# Patient Record
Sex: Female | Born: 1935 | Race: White | Hispanic: No | Smoking: Never smoker
Health system: Southern US, Community
[De-identification: ages and names within clinical notes are randomized; demographics above are authoritative.]

## PROBLEM LIST (undated history)

## (undated) DIAGNOSIS — K8020 Calculus of gallbladder without cholecystitis without obstruction: Secondary | ICD-10-CM

## (undated) DIAGNOSIS — E785 Hyperlipidemia, unspecified: Secondary | ICD-10-CM

## (undated) DIAGNOSIS — F419 Anxiety disorder, unspecified: Secondary | ICD-10-CM

## (undated) DIAGNOSIS — E119 Type 2 diabetes mellitus without complications: Secondary | ICD-10-CM

## (undated) DIAGNOSIS — K529 Noninfective gastroenteritis and colitis, unspecified: Secondary | ICD-10-CM

## (undated) DIAGNOSIS — C801 Malignant (primary) neoplasm, unspecified: Secondary | ICD-10-CM

## (undated) DIAGNOSIS — M1990 Unspecified osteoarthritis, unspecified site: Secondary | ICD-10-CM

## (undated) DIAGNOSIS — I10 Essential (primary) hypertension: Secondary | ICD-10-CM

## (undated) DIAGNOSIS — K589 Irritable bowel syndrome without diarrhea: Secondary | ICD-10-CM

## (undated) HISTORY — PX: ROTATOR CUFF REPAIR: SHX139

## (undated) HISTORY — PX: BACK SURGERY: SHX140

## (undated) HISTORY — PX: EYE SURGERY: SHX253

## (undated) HISTORY — DX: Calculus of gallbladder without cholecystitis without obstruction: K80.20

## (undated) HISTORY — DX: Essential (primary) hypertension: I10

## (undated) HISTORY — PX: WRIST SURGERY: SHX841

## (undated) HISTORY — PX: OTHER SURGICAL HISTORY: SHX169

## (undated) HISTORY — DX: Unspecified osteoarthritis, unspecified site: M19.90

## (undated) HISTORY — DX: Hyperlipidemia, unspecified: E78.5

## (undated) HISTORY — DX: Irritable bowel syndrome, unspecified: K58.9

## (undated) HISTORY — PX: APPENDECTOMY: SHX54

---

## 1997-07-19 HISTORY — PX: CHOLECYSTECTOMY: SHX55

## 1998-02-24 ENCOUNTER — Other Ambulatory Visit: Admission: RE | Admit: 1998-02-24 | Discharge: 1998-02-24 | Payer: Self-pay | Admitting: Cardiology

## 1998-04-24 ENCOUNTER — Encounter: Payer: Self-pay | Admitting: Gastroenterology

## 1998-04-24 ENCOUNTER — Ambulatory Visit (HOSPITAL_COMMUNITY): Admission: RE | Admit: 1998-04-24 | Discharge: 1998-04-24 | Payer: Self-pay | Admitting: Gastroenterology

## 1998-05-28 ENCOUNTER — Ambulatory Visit (HOSPITAL_COMMUNITY): Admission: RE | Admit: 1998-05-28 | Discharge: 1998-05-28 | Payer: Self-pay | Admitting: Gastroenterology

## 1998-05-28 ENCOUNTER — Encounter: Payer: Self-pay | Admitting: Gastroenterology

## 1998-06-19 ENCOUNTER — Observation Stay (HOSPITAL_COMMUNITY): Admission: RE | Admit: 1998-06-19 | Discharge: 1998-06-20 | Payer: Self-pay | Admitting: General Surgery

## 1998-07-09 ENCOUNTER — Ambulatory Visit (HOSPITAL_COMMUNITY): Admission: RE | Admit: 1998-07-09 | Discharge: 1998-07-09 | Payer: Self-pay | Admitting: General Surgery

## 1998-07-10 ENCOUNTER — Encounter: Payer: Self-pay | Admitting: General Surgery

## 1998-07-10 ENCOUNTER — Ambulatory Visit (HOSPITAL_COMMUNITY): Admission: RE | Admit: 1998-07-10 | Discharge: 1998-07-10 | Payer: Self-pay | Admitting: General Surgery

## 2000-04-13 ENCOUNTER — Other Ambulatory Visit: Admission: RE | Admit: 2000-04-13 | Discharge: 2000-04-13 | Payer: Self-pay | Admitting: Internal Medicine

## 2001-11-30 ENCOUNTER — Encounter: Payer: Self-pay | Admitting: Gastroenterology

## 2001-11-30 ENCOUNTER — Ambulatory Visit (HOSPITAL_COMMUNITY): Admission: RE | Admit: 2001-11-30 | Discharge: 2001-11-30 | Payer: Self-pay | Admitting: Gastroenterology

## 2003-05-04 ENCOUNTER — Emergency Department (HOSPITAL_COMMUNITY): Admission: EM | Admit: 2003-05-04 | Discharge: 2003-05-04 | Payer: Self-pay | Admitting: Emergency Medicine

## 2003-05-04 ENCOUNTER — Encounter: Payer: Self-pay | Admitting: Emergency Medicine

## 2004-05-20 ENCOUNTER — Ambulatory Visit: Payer: Self-pay

## 2006-05-12 ENCOUNTER — Encounter: Admission: RE | Admit: 2006-05-12 | Discharge: 2006-05-12 | Payer: Self-pay | Admitting: Internal Medicine

## 2008-09-03 ENCOUNTER — Ambulatory Visit (HOSPITAL_COMMUNITY): Admission: RE | Admit: 2008-09-03 | Discharge: 2008-09-03 | Payer: Self-pay | Admitting: Internal Medicine

## 2009-09-10 ENCOUNTER — Encounter: Admission: RE | Admit: 2009-09-10 | Discharge: 2009-09-10 | Payer: Self-pay | Admitting: Internal Medicine

## 2009-10-28 ENCOUNTER — Ambulatory Visit (HOSPITAL_COMMUNITY): Admission: RE | Admit: 2009-10-28 | Discharge: 2009-10-28 | Payer: Self-pay | Admitting: Internal Medicine

## 2010-06-01 ENCOUNTER — Ambulatory Visit (HOSPITAL_BASED_OUTPATIENT_CLINIC_OR_DEPARTMENT_OTHER): Admission: RE | Admit: 2010-06-01 | Discharge: 2010-06-01 | Payer: Self-pay | Admitting: Specialist

## 2010-09-29 LAB — CBC
MCH: 32.8 pg (ref 26.0–34.0)
MCV: 95.4 fL (ref 78.0–100.0)
Platelets: 285 10*3/uL (ref 150–400)
RDW: 12.5 % (ref 11.5–15.5)
WBC: 7.1 10*3/uL (ref 4.0–10.5)

## 2010-09-29 LAB — BASIC METABOLIC PANEL
BUN: 12 mg/dL (ref 6–23)
Calcium: 9.1 mg/dL (ref 8.4–10.5)
Chloride: 106 mEq/L (ref 96–112)
Creatinine, Ser: 0.7 mg/dL (ref 0.4–1.2)

## 2010-12-04 NOTE — Op Note (Signed)
NAME:  Marissa Ryan, Marissa Ryan                            ACCOUNT NO.:  [id number]   MEDICAL RECORD NO.:  [id number]                   PATIENT TYPE:  EMS   LOCATION:  ED                                   FACILITY:  [facility]   PHYSICIAN:  Dionne Ano. Everlene Other, M.D.         DATE OF BIRTH:  Oct 27, 1924   DATE OF PROCEDURE:  DATE OF DISCHARGE:                                 OPERATIVE REPORT   HISTORY:  I had the pleasure to see Marissa Ryan today in the [facility]  Emergency Room. This patient is a very pleasant 77 year old female who fell  today sustaining a left wrist fracture. She denies other injury. She is here  in the emergency room with her husband of many years.   The patient notes that she fell from a standing position. This was a simple  trip and she did not have any prodromal nausea or fainting, etc. The patient  and I have discussed this at length. She currently is in fairly severe pain  in the wrist she notes.   ALLERGIES:  PENICILLIN.   MEDICATIONS:  Hypercholesterolemia medication and antibiotics secondary to a  recent UTI.   PAST MEDICAL HISTORY:  Hypercholesterolemia.   PAST SURGICAL HISTORY:  Cholecystectomy, appendectomy, back surgery and she  has a history of a foot fracture.   SOCIAL HISTORY:  She does not smoke and she only rarely consumes alcoholic  beverages. She lives with her husband of many years.   PHYSICAL EXAMINATION:  The patient has stable vital signs. She is alert and  oriented. Lower extremity examination is benign. Right upper extremity  examination reveals a right limb which is neurovascularly intact. Neck and  back are nontender. Abdomen and chest are normal. The left wrist is swollen  at the distal radius, elbow and shoulder are nontender. She has an abrasion  over the right elbow with no bony deformities.   She has normal radial, medial, and ulnar nerve sensation and motor function.  I reviewed her x-rays which showed a fracture of the distal radius.  This is  slightly impacted and without gross collapse at present time. There is some  loss in the radial height but certainly no advanced changes at present time.   I reviewed this at length and her findings.   IMPRESSION:  Distal radius fracture left upper extremity.   PLAN:  I placed her in finger trap traction, performed gentle closed  reduction and placed a sugar tong cast on her. She tolerated this well.  Following gentle reduction, the patient then had post reduction x-rays taken  which were  reviewed. I plan to see her back in the office early next week. I have given  her Vicodin and Robaxin for pain, asked her to ice, elevate, move her  fingers frequently and notify me should any problems, questions or concerns  arise. It has been an absolute pleasure to see her today  and all questions  have been encouraged and answered.                                               Dionne Ano. Everlene Other, M.D.    [name]  D:  05/04/2003  T:  05/05/2003  Job:  [id number]

## 2010-12-18 ENCOUNTER — Ambulatory Visit (HOSPITAL_COMMUNITY): Payer: Medicare Other | Attending: Internal Medicine

## 2010-12-18 DIAGNOSIS — M810 Age-related osteoporosis without current pathological fracture: Secondary | ICD-10-CM | POA: Insufficient documentation

## 2011-05-07 ENCOUNTER — Encounter: Payer: Self-pay | Admitting: Gastroenterology

## 2011-05-12 ENCOUNTER — Encounter: Payer: Self-pay | Admitting: Gastroenterology

## 2011-06-02 ENCOUNTER — Ambulatory Visit (INDEPENDENT_AMBULATORY_CARE_PROVIDER_SITE_OTHER): Payer: Medicare Other | Admitting: Gastroenterology

## 2011-06-02 ENCOUNTER — Encounter: Payer: Self-pay | Admitting: Gastroenterology

## 2011-06-02 VITALS — BP 138/82 | HR 88 | Ht 64.0 in | Wt 157.4 lb

## 2011-06-02 DIAGNOSIS — K5900 Constipation, unspecified: Secondary | ICD-10-CM

## 2011-06-02 DIAGNOSIS — Z1211 Encounter for screening for malignant neoplasm of colon: Secondary | ICD-10-CM

## 2011-06-02 NOTE — Progress Notes (Signed)
History of Present Illness: This is an  77 year old female who relates a 2 year history of constipation with frequent bloating. She attributes this to her need for hydrocodone to control chronic back pain. She states with a combination of 2 laxatives increased fluids and increased fiber has adequate bowel movements. Her bloating is generally well controlled with as needed use of Gas-X and being. She underwent colonoscopy in 2002 showing only sigmoid colon diverticulosis. She was treated for irritable bowel syndrome at that time. Denies weight loss, abdominal pain, diarrhea, change in stool caliber, melena, hematochezia, nausea, vomiting, dysphagia, reflux symptoms, chest pain.  Past Medical History  Diagnosis Date  . Diverticulosis   . Osteoporosis   . Hypertension   . Arthritis   . Gallstones   . Hyperlipidemia   . IBS (irritable bowel syndrome)    Past Surgical History  Procedure Date  . Cholecystectomy 1999  . Back surgery     x2  . Appendectomy   . Rotator cuff repair     reports that she has never smoked. She has never used smokeless tobacco. She reports that she does not drink alcohol or use illicit drugs. family history includes Hypertension in her mother; Ovarian cancer in her sister; and Stroke in her mother. Allergies  Allergen Reactions  . Morphine And Related   . Nsaids   . Penicillins    Outpatient Encounter Prescriptions as of 06/02/2011  Medication Sig Dispense Refill  . aspirin 81 MG tablet Take 81 mg by mouth daily.        . calcium carbonate (OS-CAL) 600 MG TABS Take 600 mg by mouth 2 (two) times daily with a meal.        . calcium carbonate (TUMS - DOSED IN MG ELEMENTAL CALCIUM) 500 MG chewable tablet Chew 1 tablet by mouth as needed.        . cholecalciferol (VITAMIN D) 1000 UNITS tablet Take 1,000 Units by mouth daily.        Marland Kitchen ezetimibe-simvastatin (VYTORIN) 10-40 MG per tablet Take by mouth at bedtime. 1/2 tablet 4 times a week       . fish oil-omega-3 fatty  acids 1000 MG capsule Take 2 g by mouth daily.        Marland Kitchen gabapentin (NEURONTIN) 300 MG capsule Take 300 mg by mouth 2 (two) times daily.        Marland Kitchen losartan (COZAAR) 50 MG tablet Take 50 mg by mouth daily. 1/2 tablet daily       . Multiple Vitamins-Minerals (CENTRUM) tablet Take 1 tablet by mouth daily.        Marland Kitchen oxyCODONE-acetaminophen (PERCOCET) 10-325 MG per tablet Take 1 tablet by mouth every 4 (four) hours as needed.        Bertram Gala Glycol-Propyl Glycol (SYSTANE) 0.4-0.3 % SOLN Apply 1 drop to eye as needed.        . timolol (BETIMOL) 0.5 % ophthalmic solution Place 1 drop into the left eye 2 (two) times daily.        . Travoprost, BAK Free, (TRAVATAN) 0.004 % SOLN ophthalmic solution Place 1 drop into the left eye at bedtime.        Marland Kitchen zolpidem (AMBIEN) 10 MG tablet Take 10 mg by mouth at bedtime as needed.          Review of Systems: Pertinent positive and negative review of systems were noted in the above HPI section. All other review of systems were otherwise negative.   Physical Exam: General:  Well developed , well nourished, no acute distress Head: Normocephalic and atraumatic Eyes:  sclerae anicteric, EOMI Ears: Normal auditory acuity Mouth: No deformity or lesions Neck: Supple, no masses or thyromegaly Lungs: Clear throughout to auscultation Heart: Regular rate and rhythm; no murmurs, rubs or bruits Abdomen: Soft, non tender and non distended. No masses, hepatosplenomegaly or hernias noted. Normal Bowel sounds Musculoskeletal: Symmetrical with no gross deformities  Skin: No lesions on visible extremities Pulses:  Normal pulses noted Extremities: No clubbing, cyanosis, edema or deformities noted Neurological: Alert oriented x 4, grossly nonfocal Cervical Nodes:  No significant cervical adenopathy Inguinal Nodes: No significant inguinal adenopathy Psychological:  Alert and cooperative. Normal mood and affect  Assessment and Recommendations:  1. Constipation with frequent  bloating likely related to pain medications. Continue current laxatives along with a high fiber diet and Gas-X and Beano as needed. It has been 10 years since her last colonoscopy but given her age colonoscopy would be optional for her and she prefers not to proceed with a screening colonoscopy.

## 2011-06-02 NOTE — Patient Instructions (Signed)
Constipation in Adults Constipation is having fewer than 2 bowel movements per week. Usually, the stools are hard. As we grow older, constipation is more common. If you try to fix constipation with laxatives, the problem may get worse. This is because laxatives taken over a long period of time make the colon muscles weaker. A low-fiber diet, not taking in enough fluids, and taking some medicines may make these problems worse. MEDICATIONS THAT MAY CAUSE CONSTIPATION  Water pills (diuretics).   Calcium channel blockers (used to control blood pressure and for the heart).   Certain pain medicines (narcotics).   Anticholinergics.   Anti-inflammatory agents.   Antacids that contain aluminum.  DISEASES THAT CONTRIBUTE TO CONSTIPATION  Diabetes.   Parkinson's disease.   Dementia.   Stroke.   Depression.   Illnesses that cause problems with salt and water metabolism.  HOME CARE INSTRUCTIONS   Constipation is usually best cared for without medicines. Increasing dietary fiber and eating more fruits and vegetables is the best way to manage constipation.   Slowly increase fiber intake to 25 to 38 grams per day. Whole grains, fruits, vegetables, and legumes are good sources of fiber. A dietitian can further help you incorporate high-fiber foods into your diet.   Drink enough water and fluids to keep your urine clear or pale yellow.   A fiber supplement may be added to your diet if you cannot get enough fiber from foods.   Increasing your activities also helps improve regularity.   Suppositories, as suggested by your caregiver, will also help. If you are using antacids, such as aluminum or calcium containing products, it will be helpful to switch to products containing magnesium if your caregiver says it is okay.   If you have been given a liquid injection (enema) today, this is only a temporary measure. It should not be relied on for treatment of longstanding (chronic) constipation.    Stronger measures, such as magnesium sulfate, should be avoided if possible. This may cause uncontrollable diarrhea. Using magnesium sulfate may not allow you time to make it to the bathroom.  SEEK IMMEDIATE MEDICAL CARE IF:   There is bright red blood in the stool.   The constipation stays for more than 4 days.   There is belly (abdominal) or rectal pain.   You do not seem to be getting better.   You have any questions or concerns.  MAKE SURE YOU:   Understand these instructions.   Will watch your condition.   Will get help right away if you are not doing well or get worse.  Document Released: 04/02/2004 Document Revised: 03/17/2011 Document Reviewed: 02/21/2008 [facility] Patient Information 2012 [place], [place].  cc: Rodrigo Ran, MD

## 2011-06-03 ENCOUNTER — Encounter: Payer: Self-pay | Admitting: Gastroenterology

## 2011-07-27 DIAGNOSIS — Z1231 Encounter for screening mammogram for malignant neoplasm of breast: Secondary | ICD-10-CM | POA: Diagnosis not present

## 2011-07-29 DIAGNOSIS — M961 Postlaminectomy syndrome, not elsewhere classified: Secondary | ICD-10-CM | POA: Diagnosis not present

## 2011-08-10 DIAGNOSIS — H409 Unspecified glaucoma: Secondary | ICD-10-CM | POA: Diagnosis not present

## 2011-08-10 DIAGNOSIS — H4011X0 Primary open-angle glaucoma, stage unspecified: Secondary | ICD-10-CM | POA: Diagnosis not present

## 2011-08-11 DIAGNOSIS — M4120 Other idiopathic scoliosis, site unspecified: Secondary | ICD-10-CM | POA: Diagnosis not present

## 2011-08-11 DIAGNOSIS — M5137 Other intervertebral disc degeneration, lumbosacral region: Secondary | ICD-10-CM | POA: Diagnosis not present

## 2011-08-11 DIAGNOSIS — M545 Low back pain: Secondary | ICD-10-CM | POA: Diagnosis not present

## 2011-08-11 DIAGNOSIS — M961 Postlaminectomy syndrome, not elsewhere classified: Secondary | ICD-10-CM | POA: Diagnosis not present

## 2011-09-07 DIAGNOSIS — M961 Postlaminectomy syndrome, not elsewhere classified: Secondary | ICD-10-CM | POA: Diagnosis not present

## 2011-10-13 DIAGNOSIS — H409 Unspecified glaucoma: Secondary | ICD-10-CM | POA: Diagnosis not present

## 2011-10-13 DIAGNOSIS — H4011X0 Primary open-angle glaucoma, stage unspecified: Secondary | ICD-10-CM | POA: Diagnosis not present

## 2011-10-19 DIAGNOSIS — R809 Proteinuria, unspecified: Secondary | ICD-10-CM | POA: Diagnosis not present

## 2011-10-19 DIAGNOSIS — E559 Vitamin D deficiency, unspecified: Secondary | ICD-10-CM | POA: Diagnosis not present

## 2011-10-19 DIAGNOSIS — E785 Hyperlipidemia, unspecified: Secondary | ICD-10-CM | POA: Diagnosis not present

## 2011-10-19 DIAGNOSIS — R82998 Other abnormal findings in urine: Secondary | ICD-10-CM | POA: Diagnosis not present

## 2011-10-19 DIAGNOSIS — E119 Type 2 diabetes mellitus without complications: Secondary | ICD-10-CM | POA: Diagnosis not present

## 2011-10-19 DIAGNOSIS — M810 Age-related osteoporosis without current pathological fracture: Secondary | ICD-10-CM | POA: Diagnosis not present

## 2011-10-19 DIAGNOSIS — I10 Essential (primary) hypertension: Secondary | ICD-10-CM | POA: Diagnosis not present

## 2011-10-25 DIAGNOSIS — H5210 Myopia, unspecified eye: Secondary | ICD-10-CM | POA: Diagnosis not present

## 2011-10-25 DIAGNOSIS — H409 Unspecified glaucoma: Secondary | ICD-10-CM | POA: Diagnosis not present

## 2011-10-25 DIAGNOSIS — H52229 Regular astigmatism, unspecified eye: Secondary | ICD-10-CM | POA: Diagnosis not present

## 2011-10-25 DIAGNOSIS — H4011X0 Primary open-angle glaucoma, stage unspecified: Secondary | ICD-10-CM | POA: Diagnosis not present

## 2011-10-26 DIAGNOSIS — Z0000 Encounter for general adult medical examination without abnormal findings: Secondary | ICD-10-CM | POA: Diagnosis not present

## 2011-10-26 DIAGNOSIS — E119 Type 2 diabetes mellitus without complications: Secondary | ICD-10-CM | POA: Diagnosis not present

## 2011-10-26 DIAGNOSIS — I10 Essential (primary) hypertension: Secondary | ICD-10-CM | POA: Diagnosis not present

## 2011-10-26 DIAGNOSIS — Z23 Encounter for immunization: Secondary | ICD-10-CM | POA: Diagnosis not present

## 2011-10-26 DIAGNOSIS — Z124 Encounter for screening for malignant neoplasm of cervix: Secondary | ICD-10-CM | POA: Diagnosis not present

## 2011-10-26 DIAGNOSIS — R809 Proteinuria, unspecified: Secondary | ICD-10-CM | POA: Diagnosis not present

## 2011-10-27 DIAGNOSIS — Z1212 Encounter for screening for malignant neoplasm of rectum: Secondary | ICD-10-CM | POA: Diagnosis not present

## 2011-10-29 DIAGNOSIS — M961 Postlaminectomy syndrome, not elsewhere classified: Secondary | ICD-10-CM | POA: Diagnosis not present

## 2011-11-11 ENCOUNTER — Ambulatory Visit (INDEPENDENT_AMBULATORY_CARE_PROVIDER_SITE_OTHER): Payer: Medicare Other | Admitting: Ophthalmology

## 2011-11-11 DIAGNOSIS — E11319 Type 2 diabetes mellitus with unspecified diabetic retinopathy without macular edema: Secondary | ICD-10-CM | POA: Diagnosis not present

## 2011-11-11 DIAGNOSIS — I10 Essential (primary) hypertension: Secondary | ICD-10-CM

## 2011-11-11 DIAGNOSIS — H3581 Retinal edema: Secondary | ICD-10-CM

## 2011-11-11 DIAGNOSIS — E1165 Type 2 diabetes mellitus with hyperglycemia: Secondary | ICD-10-CM

## 2011-11-11 DIAGNOSIS — H35039 Hypertensive retinopathy, unspecified eye: Secondary | ICD-10-CM

## 2011-11-11 DIAGNOSIS — H43819 Vitreous degeneration, unspecified eye: Secondary | ICD-10-CM

## 2011-11-11 DIAGNOSIS — E1139 Type 2 diabetes mellitus with other diabetic ophthalmic complication: Secondary | ICD-10-CM | POA: Diagnosis not present

## 2011-11-29 DIAGNOSIS — M65839 Other synovitis and tenosynovitis, unspecified forearm: Secondary | ICD-10-CM | POA: Diagnosis not present

## 2011-11-29 DIAGNOSIS — M65849 Other synovitis and tenosynovitis, unspecified hand: Secondary | ICD-10-CM | POA: Diagnosis not present

## 2011-12-09 DIAGNOSIS — M65839 Other synovitis and tenosynovitis, unspecified forearm: Secondary | ICD-10-CM | POA: Diagnosis not present

## 2011-12-09 DIAGNOSIS — M65849 Other synovitis and tenosynovitis, unspecified hand: Secondary | ICD-10-CM | POA: Diagnosis not present

## 2011-12-22 ENCOUNTER — Encounter (INDEPENDENT_AMBULATORY_CARE_PROVIDER_SITE_OTHER): Payer: Medicare Other | Admitting: Ophthalmology

## 2011-12-22 DIAGNOSIS — E11319 Type 2 diabetes mellitus with unspecified diabetic retinopathy without macular edema: Secondary | ICD-10-CM | POA: Diagnosis not present

## 2011-12-22 DIAGNOSIS — H43819 Vitreous degeneration, unspecified eye: Secondary | ICD-10-CM | POA: Diagnosis not present

## 2011-12-22 DIAGNOSIS — H35039 Hypertensive retinopathy, unspecified eye: Secondary | ICD-10-CM

## 2011-12-22 DIAGNOSIS — I10 Essential (primary) hypertension: Secondary | ICD-10-CM

## 2011-12-22 DIAGNOSIS — E1139 Type 2 diabetes mellitus with other diabetic ophthalmic complication: Secondary | ICD-10-CM

## 2011-12-22 DIAGNOSIS — H3581 Retinal edema: Secondary | ICD-10-CM | POA: Diagnosis not present

## 2012-01-05 DIAGNOSIS — T6391XA Toxic effect of contact with unspecified venomous animal, accidental (unintentional), initial encounter: Secondary | ICD-10-CM | POA: Diagnosis not present

## 2012-03-02 DIAGNOSIS — M545 Low back pain: Secondary | ICD-10-CM | POA: Diagnosis not present

## 2012-03-22 DIAGNOSIS — E079 Disorder of thyroid, unspecified: Secondary | ICD-10-CM | POA: Diagnosis not present

## 2012-03-22 DIAGNOSIS — E119 Type 2 diabetes mellitus without complications: Secondary | ICD-10-CM | POA: Diagnosis not present

## 2012-03-22 DIAGNOSIS — E785 Hyperlipidemia, unspecified: Secondary | ICD-10-CM | POA: Diagnosis not present

## 2012-03-23 DIAGNOSIS — G894 Chronic pain syndrome: Secondary | ICD-10-CM | POA: Diagnosis not present

## 2012-03-23 DIAGNOSIS — M961 Postlaminectomy syndrome, not elsewhere classified: Secondary | ICD-10-CM | POA: Diagnosis not present

## 2012-03-24 DIAGNOSIS — H4011X0 Primary open-angle glaucoma, stage unspecified: Secondary | ICD-10-CM | POA: Diagnosis not present

## 2012-03-24 DIAGNOSIS — H409 Unspecified glaucoma: Secondary | ICD-10-CM | POA: Diagnosis not present

## 2012-03-29 ENCOUNTER — Other Ambulatory Visit: Payer: Self-pay | Admitting: Internal Medicine

## 2012-03-29 DIAGNOSIS — E785 Hyperlipidemia, unspecified: Secondary | ICD-10-CM | POA: Diagnosis not present

## 2012-03-29 DIAGNOSIS — I10 Essential (primary) hypertension: Secondary | ICD-10-CM | POA: Diagnosis not present

## 2012-03-29 DIAGNOSIS — E119 Type 2 diabetes mellitus without complications: Secondary | ICD-10-CM | POA: Diagnosis not present

## 2012-03-29 DIAGNOSIS — R140 Abdominal distension (gaseous): Secondary | ICD-10-CM

## 2012-03-29 DIAGNOSIS — Z79899 Other long term (current) drug therapy: Secondary | ICD-10-CM | POA: Diagnosis not present

## 2012-03-29 DIAGNOSIS — M899 Disorder of bone, unspecified: Secondary | ICD-10-CM | POA: Diagnosis not present

## 2012-03-29 DIAGNOSIS — M810 Age-related osteoporosis without current pathological fracture: Secondary | ICD-10-CM | POA: Diagnosis not present

## 2012-03-29 DIAGNOSIS — M949 Disorder of cartilage, unspecified: Secondary | ICD-10-CM | POA: Diagnosis not present

## 2012-03-31 ENCOUNTER — Ambulatory Visit
Admission: RE | Admit: 2012-03-31 | Discharge: 2012-03-31 | Disposition: A | Discharge status: Home / Self Care | Payer: Medicare Other | Source: Ambulatory Visit | Attending: Internal Medicine | Admitting: Internal Medicine

## 2012-03-31 DIAGNOSIS — R140 Abdominal distension (gaseous): Secondary | ICD-10-CM

## 2012-03-31 DIAGNOSIS — D259 Leiomyoma of uterus, unspecified: Secondary | ICD-10-CM | POA: Diagnosis not present

## 2012-04-07 DIAGNOSIS — H409 Unspecified glaucoma: Secondary | ICD-10-CM | POA: Diagnosis not present

## 2012-04-07 DIAGNOSIS — H4010X0 Unspecified open-angle glaucoma, stage unspecified: Secondary | ICD-10-CM | POA: Diagnosis not present

## 2012-04-12 ENCOUNTER — Encounter (HOSPITAL_COMMUNITY)
Admission: RE | Admit: 2012-04-12 | Discharge: 2012-04-12 | Disposition: A | Discharge status: Home / Self Care | Payer: Medicare Other | Source: Ambulatory Visit | Attending: Internal Medicine | Admitting: Internal Medicine

## 2012-04-12 DIAGNOSIS — M810 Age-related osteoporosis without current pathological fracture: Secondary | ICD-10-CM | POA: Diagnosis not present

## 2012-04-12 MED ORDER — SODIUM CHLORIDE 0.9 % IV SOLN
INTRAVENOUS | Status: AC
Start: 2012-04-12 — End: 2012-04-12
  Administered 2012-04-12: 11:00:00 via INTRAVENOUS

## 2012-04-12 MED ORDER — ZOLEDRONIC ACID 5 MG/100ML IV SOLN
5.0000 mg | Freq: Once | INTRAVENOUS | Status: AC
  Administered 2012-04-12: 5 mg via INTRAVENOUS
  Filled 2012-04-12: qty 100

## 2012-04-20 DIAGNOSIS — G894 Chronic pain syndrome: Secondary | ICD-10-CM | POA: Diagnosis not present

## 2012-04-20 DIAGNOSIS — C44621 Squamous cell carcinoma of skin of unspecified upper limb, including shoulder: Secondary | ICD-10-CM | POA: Diagnosis not present

## 2012-04-24 ENCOUNTER — Ambulatory Visit (INDEPENDENT_AMBULATORY_CARE_PROVIDER_SITE_OTHER): Payer: Medicare Other | Admitting: Ophthalmology

## 2012-04-24 DIAGNOSIS — H43819 Vitreous degeneration, unspecified eye: Secondary | ICD-10-CM

## 2012-04-24 DIAGNOSIS — I10 Essential (primary) hypertension: Secondary | ICD-10-CM

## 2012-04-24 DIAGNOSIS — E11319 Type 2 diabetes mellitus with unspecified diabetic retinopathy without macular edema: Secondary | ICD-10-CM

## 2012-04-24 DIAGNOSIS — H40159 Residual stage of open-angle glaucoma, unspecified eye: Secondary | ICD-10-CM

## 2012-04-24 DIAGNOSIS — E1165 Type 2 diabetes mellitus with hyperglycemia: Secondary | ICD-10-CM

## 2012-04-24 DIAGNOSIS — H35039 Hypertensive retinopathy, unspecified eye: Secondary | ICD-10-CM

## 2012-04-24 DIAGNOSIS — E1139 Type 2 diabetes mellitus with other diabetic ophthalmic complication: Secondary | ICD-10-CM

## 2012-04-27 DIAGNOSIS — Z23 Encounter for immunization: Secondary | ICD-10-CM | POA: Diagnosis not present

## 2012-05-22 DIAGNOSIS — C44621 Squamous cell carcinoma of skin of unspecified upper limb, including shoulder: Secondary | ICD-10-CM | POA: Diagnosis not present

## 2012-06-19 DIAGNOSIS — G894 Chronic pain syndrome: Secondary | ICD-10-CM | POA: Diagnosis not present

## 2012-07-07 DIAGNOSIS — R5383 Other fatigue: Secondary | ICD-10-CM | POA: Diagnosis not present

## 2012-07-07 DIAGNOSIS — R5381 Other malaise: Secondary | ICD-10-CM | POA: Diagnosis not present

## 2012-07-07 DIAGNOSIS — E785 Hyperlipidemia, unspecified: Secondary | ICD-10-CM | POA: Diagnosis not present

## 2012-07-07 DIAGNOSIS — Z79899 Other long term (current) drug therapy: Secondary | ICD-10-CM | POA: Diagnosis not present

## 2012-07-07 DIAGNOSIS — I10 Essential (primary) hypertension: Secondary | ICD-10-CM | POA: Diagnosis not present

## 2012-07-07 DIAGNOSIS — E119 Type 2 diabetes mellitus without complications: Secondary | ICD-10-CM | POA: Diagnosis not present

## 2012-07-28 DIAGNOSIS — E1139 Type 2 diabetes mellitus with other diabetic ophthalmic complication: Secondary | ICD-10-CM | POA: Diagnosis not present

## 2012-07-28 DIAGNOSIS — H409 Unspecified glaucoma: Secondary | ICD-10-CM | POA: Diagnosis not present

## 2012-07-28 DIAGNOSIS — H4011X0 Primary open-angle glaucoma, stage unspecified: Secondary | ICD-10-CM | POA: Diagnosis not present

## 2012-08-25 ENCOUNTER — Ambulatory Visit (INDEPENDENT_AMBULATORY_CARE_PROVIDER_SITE_OTHER): Payer: Medicare Other | Admitting: Ophthalmology

## 2012-08-25 DIAGNOSIS — I10 Essential (primary) hypertension: Secondary | ICD-10-CM

## 2012-08-25 DIAGNOSIS — H43819 Vitreous degeneration, unspecified eye: Secondary | ICD-10-CM

## 2012-08-25 DIAGNOSIS — E1139 Type 2 diabetes mellitus with other diabetic ophthalmic complication: Secondary | ICD-10-CM

## 2012-08-25 DIAGNOSIS — H35039 Hypertensive retinopathy, unspecified eye: Secondary | ICD-10-CM

## 2012-08-25 DIAGNOSIS — E11319 Type 2 diabetes mellitus with unspecified diabetic retinopathy without macular edema: Secondary | ICD-10-CM

## 2012-09-07 DIAGNOSIS — H409 Unspecified glaucoma: Secondary | ICD-10-CM | POA: Diagnosis not present

## 2012-09-07 DIAGNOSIS — H4011X0 Primary open-angle glaucoma, stage unspecified: Secondary | ICD-10-CM | POA: Diagnosis not present

## 2012-09-07 DIAGNOSIS — I10 Essential (primary) hypertension: Secondary | ICD-10-CM | POA: Diagnosis not present

## 2012-09-07 DIAGNOSIS — K219 Gastro-esophageal reflux disease without esophagitis: Secondary | ICD-10-CM | POA: Diagnosis not present

## 2012-09-07 DIAGNOSIS — H4010X0 Unspecified open-angle glaucoma, stage unspecified: Secondary | ICD-10-CM | POA: Diagnosis not present

## 2012-09-27 DIAGNOSIS — D235 Other benign neoplasm of skin of trunk: Secondary | ICD-10-CM | POA: Diagnosis not present

## 2012-09-27 DIAGNOSIS — L570 Actinic keratosis: Secondary | ICD-10-CM | POA: Diagnosis not present

## 2012-09-27 DIAGNOSIS — Z85828 Personal history of other malignant neoplasm of skin: Secondary | ICD-10-CM | POA: Diagnosis not present

## 2012-10-06 DIAGNOSIS — M549 Dorsalgia, unspecified: Secondary | ICD-10-CM | POA: Diagnosis not present

## 2012-10-06 DIAGNOSIS — E119 Type 2 diabetes mellitus without complications: Secondary | ICD-10-CM | POA: Diagnosis not present

## 2012-10-06 DIAGNOSIS — M810 Age-related osteoporosis without current pathological fracture: Secondary | ICD-10-CM | POA: Diagnosis not present

## 2012-10-06 DIAGNOSIS — I10 Essential (primary) hypertension: Secondary | ICD-10-CM | POA: Diagnosis not present

## 2012-10-06 DIAGNOSIS — E785 Hyperlipidemia, unspecified: Secondary | ICD-10-CM | POA: Diagnosis not present

## 2012-10-10 DIAGNOSIS — M545 Low back pain: Secondary | ICD-10-CM | POA: Diagnosis not present

## 2012-10-10 DIAGNOSIS — M5137 Other intervertebral disc degeneration, lumbosacral region: Secondary | ICD-10-CM | POA: Diagnosis not present

## 2012-10-24 DIAGNOSIS — L570 Actinic keratosis: Secondary | ICD-10-CM | POA: Diagnosis not present

## 2012-11-07 DIAGNOSIS — Z85828 Personal history of other malignant neoplasm of skin: Secondary | ICD-10-CM | POA: Diagnosis not present

## 2012-11-15 DIAGNOSIS — H409 Unspecified glaucoma: Secondary | ICD-10-CM | POA: Diagnosis not present

## 2012-11-15 DIAGNOSIS — Z9889 Other specified postprocedural states: Secondary | ICD-10-CM | POA: Diagnosis not present

## 2012-11-15 DIAGNOSIS — H4011X0 Primary open-angle glaucoma, stage unspecified: Secondary | ICD-10-CM | POA: Diagnosis not present

## 2012-12-15 DIAGNOSIS — M810 Age-related osteoporosis without current pathological fracture: Secondary | ICD-10-CM | POA: Diagnosis not present

## 2012-12-15 DIAGNOSIS — E119 Type 2 diabetes mellitus without complications: Secondary | ICD-10-CM | POA: Diagnosis not present

## 2012-12-15 DIAGNOSIS — R82998 Other abnormal findings in urine: Secondary | ICD-10-CM | POA: Diagnosis not present

## 2012-12-15 DIAGNOSIS — E785 Hyperlipidemia, unspecified: Secondary | ICD-10-CM | POA: Diagnosis not present

## 2012-12-19 DIAGNOSIS — H4011X0 Primary open-angle glaucoma, stage unspecified: Secondary | ICD-10-CM | POA: Diagnosis not present

## 2012-12-22 DIAGNOSIS — R82998 Other abnormal findings in urine: Secondary | ICD-10-CM | POA: Diagnosis not present

## 2012-12-22 DIAGNOSIS — Z0000 Encounter for general adult medical examination without abnormal findings: Secondary | ICD-10-CM | POA: Diagnosis not present

## 2012-12-22 DIAGNOSIS — E119 Type 2 diabetes mellitus without complications: Secondary | ICD-10-CM | POA: Diagnosis not present

## 2012-12-22 DIAGNOSIS — Z79899 Other long term (current) drug therapy: Secondary | ICD-10-CM | POA: Diagnosis not present

## 2012-12-22 DIAGNOSIS — R809 Proteinuria, unspecified: Secondary | ICD-10-CM | POA: Diagnosis not present

## 2012-12-22 DIAGNOSIS — E785 Hyperlipidemia, unspecified: Secondary | ICD-10-CM | POA: Diagnosis not present

## 2012-12-22 DIAGNOSIS — M549 Dorsalgia, unspecified: Secondary | ICD-10-CM | POA: Diagnosis not present

## 2012-12-22 DIAGNOSIS — N3090 Cystitis, unspecified without hematuria: Secondary | ICD-10-CM | POA: Diagnosis not present

## 2012-12-22 DIAGNOSIS — I10 Essential (primary) hypertension: Secondary | ICD-10-CM | POA: Diagnosis not present

## 2012-12-29 DIAGNOSIS — M961 Postlaminectomy syndrome, not elsewhere classified: Secondary | ICD-10-CM | POA: Diagnosis not present

## 2013-01-02 DIAGNOSIS — M961 Postlaminectomy syndrome, not elsewhere classified: Secondary | ICD-10-CM | POA: Diagnosis not present

## 2013-01-09 DIAGNOSIS — M961 Postlaminectomy syndrome, not elsewhere classified: Secondary | ICD-10-CM | POA: Diagnosis not present

## 2013-01-12 DIAGNOSIS — M542 Cervicalgia: Secondary | ICD-10-CM | POA: Diagnosis not present

## 2013-01-12 DIAGNOSIS — M47812 Spondylosis without myelopathy or radiculopathy, cervical region: Secondary | ICD-10-CM | POA: Diagnosis not present

## 2013-01-12 DIAGNOSIS — M5030 Other cervical disc degeneration, unspecified cervical region: Secondary | ICD-10-CM | POA: Diagnosis not present

## 2013-01-24 DIAGNOSIS — H4011X0 Primary open-angle glaucoma, stage unspecified: Secondary | ICD-10-CM | POA: Diagnosis not present

## 2013-01-26 DIAGNOSIS — Z79899 Other long term (current) drug therapy: Secondary | ICD-10-CM | POA: Diagnosis not present

## 2013-01-26 DIAGNOSIS — G894 Chronic pain syndrome: Secondary | ICD-10-CM | POA: Diagnosis not present

## 2013-01-26 DIAGNOSIS — M961 Postlaminectomy syndrome, not elsewhere classified: Secondary | ICD-10-CM | POA: Diagnosis not present

## 2013-01-26 DIAGNOSIS — M542 Cervicalgia: Secondary | ICD-10-CM | POA: Diagnosis not present

## 2013-02-19 DIAGNOSIS — L905 Scar conditions and fibrosis of skin: Secondary | ICD-10-CM | POA: Diagnosis not present

## 2013-02-19 DIAGNOSIS — L570 Actinic keratosis: Secondary | ICD-10-CM | POA: Diagnosis not present

## 2013-02-27 DIAGNOSIS — M961 Postlaminectomy syndrome, not elsewhere classified: Secondary | ICD-10-CM | POA: Diagnosis not present

## 2013-03-03 ENCOUNTER — Encounter (HOSPITAL_COMMUNITY): Payer: Self-pay | Admitting: Emergency Medicine

## 2013-03-03 ENCOUNTER — Emergency Department (HOSPITAL_COMMUNITY)
Admission: EM | Admit: 2013-03-03 | Discharge: 2013-03-03 | Disposition: A | Discharge status: Home / Self Care | Payer: Medicare Other | Attending: Emergency Medicine | Admitting: Emergency Medicine

## 2013-03-03 ENCOUNTER — Emergency Department (HOSPITAL_COMMUNITY): Payer: Medicare Other

## 2013-03-03 DIAGNOSIS — I10 Essential (primary) hypertension: Secondary | ICD-10-CM | POA: Insufficient documentation

## 2013-03-03 DIAGNOSIS — Z862 Personal history of diseases of the blood and blood-forming organs and certain disorders involving the immune mechanism: Secondary | ICD-10-CM | POA: Diagnosis not present

## 2013-03-03 DIAGNOSIS — Z79899 Other long term (current) drug therapy: Secondary | ICD-10-CM | POA: Diagnosis not present

## 2013-03-03 DIAGNOSIS — M25552 Pain in left hip: Secondary | ICD-10-CM

## 2013-03-03 DIAGNOSIS — M1990 Unspecified osteoarthritis, unspecified site: Secondary | ICD-10-CM

## 2013-03-03 DIAGNOSIS — M25559 Pain in unspecified hip: Secondary | ICD-10-CM | POA: Diagnosis not present

## 2013-03-03 DIAGNOSIS — M129 Arthropathy, unspecified: Secondary | ICD-10-CM | POA: Diagnosis not present

## 2013-03-03 DIAGNOSIS — Z7982 Long term (current) use of aspirin: Secondary | ICD-10-CM | POA: Insufficient documentation

## 2013-03-03 DIAGNOSIS — Z9889 Other specified postprocedural states: Secondary | ICD-10-CM | POA: Diagnosis not present

## 2013-03-03 DIAGNOSIS — M810 Age-related osteoporosis without current pathological fracture: Secondary | ICD-10-CM | POA: Diagnosis not present

## 2013-03-03 DIAGNOSIS — Z880 Allergy status to penicillin: Secondary | ICD-10-CM | POA: Insufficient documentation

## 2013-03-03 DIAGNOSIS — M169 Osteoarthritis of hip, unspecified: Secondary | ICD-10-CM | POA: Diagnosis not present

## 2013-03-03 DIAGNOSIS — Z8639 Personal history of other endocrine, nutritional and metabolic disease: Secondary | ICD-10-CM | POA: Insufficient documentation

## 2013-03-03 DIAGNOSIS — M5137 Other intervertebral disc degeneration, lumbosacral region: Secondary | ICD-10-CM | POA: Diagnosis not present

## 2013-03-03 DIAGNOSIS — Z8719 Personal history of other diseases of the digestive system: Secondary | ICD-10-CM | POA: Diagnosis not present

## 2013-03-03 DIAGNOSIS — M1610 Unilateral primary osteoarthritis, unspecified hip: Secondary | ICD-10-CM | POA: Insufficient documentation

## 2013-03-03 MED ORDER — NAPROXEN 375 MG PO TABS: 375.0000 mg | ORAL_TABLET | Freq: Two times a day (BID) | ORAL | Status: DC

## 2013-03-03 NOTE — ED Provider Notes (Signed)
Medical screening examination/treatment/procedure(s) were performed by non-physician practitioner and as supervising physician I was immediately available for consultation/collaboration.  Ethelda Chick, MD 03/03/13 [phone]

## 2013-03-03 NOTE — ED Provider Notes (Signed)
CSN: [id number]     Arrival date & time 03/03/13  7829 History     First MD Initiated Contact with Patient 03/03/13 1046     Chief Complaint  Patient presents with  . Hip Pain   (Consider location/radiation/quality/duration/timing/severity/associated sxs/prior Treatment) Patient is a 79 y.o. female presenting with hip pain. The history is provided by the patient, the spouse and a friend. No language interpreter was used.  Hip Pain This is a new problem. The current episode started yesterday. Pertinent negatives include no chills, fever, neck pain, numbness or weakness. Associated symptoms comments: She states she has had significant left hip pain since yesterday without known injury. She was active as usual, running errands and getting into and out of the car and reports gradual onset hip pain that prevented her from being able to get out of the car without assistance from husband when she returned home. She has chronic right hip back pain but states the left hip has never bothered her in the past. No numbness, tingling. She denies back pain, urinary or bowel incontinence. No abdominal pain..    Past Medical History  Diagnosis Date  . Diverticulosis   . Osteoporosis   . Hypertension   . Arthritis   . Gallstones   . Hyperlipidemia   . IBS (irritable bowel syndrome)    Past Surgical History  Procedure Laterality Date  . Cholecystectomy  1999  . Back surgery      x2  . Appendectomy    . Rotator cuff repair     Family History  Problem Relation Age of Onset  . Stroke Mother   . Hypertension Mother   . Ovarian cancer Sister    History  Substance Use Topics  . Smoking status: Never Smoker   . Smokeless tobacco: Never Used  . Alcohol Use: No   OB History   Grav Para Term Preterm Abortions TAB SAB Ect Mult Living                 Review of Systems  Constitutional: Negative for fever and chills.  HENT: Negative.  Negative for neck pain.   Gastrointestinal: Negative.    Genitourinary: Negative.   Musculoskeletal: Negative for back pain.       See HPI.  Skin: Negative.   Neurological: Negative.  Negative for weakness and numbness.    Allergies  Ancef; Morphine and related; and Penicillins  Home Medications   Current Outpatient Rx  Name  Route  Sig  Dispense  Refill  . aspirin 81 MG tablet   Oral   Take 81 mg by mouth daily.           . calcium carbonate (OS-CAL) 600 MG TABS   Oral   Take 600 mg by mouth 2 (two) times daily with a meal.           . calcium carbonate (TUMS - DOSED IN MG ELEMENTAL CALCIUM) 500 MG chewable tablet   Oral   Chew 1 tablet by mouth as needed.           . cholecalciferol (VITAMIN D) 1000 UNITS tablet   Oral   Take 1,000 Units by mouth daily.           Marland Kitchen ezetimibe-simvastatin (VYTORIN) 10-40 MG per tablet   Oral   Take by mouth at bedtime. 1/2 tablet 4 times a week          . fish oil-omega-3 fatty acids 1000 MG capsule   Oral  Take 2 g by mouth daily.           Marland Kitchen gabapentin (NEURONTIN) 300 MG capsule   Oral   Take 300 mg by mouth 2 (two) times daily.           . Multiple Vitamins-Minerals (CENTRUM) tablet   Oral   Take 1 tablet by mouth daily.           Marland Kitchen oxyCODONE-acetaminophen (PERCOCET) 10-325 MG per tablet   Oral   Take 1 tablet by mouth every 4 (four) hours as needed.           Bertram Gala Glycol-Propyl Glycol (SYSTANE) 0.4-0.3 % SOLN   Ophthalmic   Apply 1 drop to eye as needed.           . timolol (BETIMOL) 0.5 % ophthalmic solution   Left Eye   Place 1 drop into the left eye 2 (two) times daily.           . Travoprost, BAK Free, (TRAVATAN) 0.004 % SOLN ophthalmic solution   Left Eye   Place 1 drop into the left eye at bedtime.           Marland Kitchen zolpidem (AMBIEN) 10 MG tablet   Oral   Take 10 mg by mouth at bedtime as needed.            BP 192/71  Pulse 64  Temp(Src) 98 F (36.7 C) (Oral)  Resp 22  SpO2 99% Physical Exam  Constitutional: She is oriented to  person, place, and time. She appears well-developed and well-nourished.  Neck: Normal range of motion.  Cardiovascular: Intact distal pulses.   Pulmonary/Chest: Effort normal. She has no wheezes. She has no rales.  Musculoskeletal:  FROM left hip without deformity or marked discomfort. No pelvic lumbar tenderness. Able to life left leg straight from bed without difficulty or complaint of significant pain.   Neurological: She is alert and oriented to person, place, and time. She has normal reflexes. Coordination normal.  Skin: Skin is warm and dry.    ED Course   Procedures (including critical care time) Dg Lumbar Spine Complete  03/03/2013   *RADIOLOGY REPORT*  Clinical Data: No injury, with low back pain and left hip pain for 1 day, unable to bear weight  LUMBAR SPINE - COMPLETE 4+ VIEW  Comparison: Eight  Findings: Mild scoliotic curvature of the lumbar spine.  Normal antral posterior alignment.  Mild compression deformity L1 and T11. Moderate degenerative disc disease at L1-2.  Severe L2-3 degenerative disc disease.  Moderate L3-4 and severe L4-5 degenerative disc disease.  Moderate L5 S1 degenerative disc disease.  Facet arthropathy noted throughout the entire lumbar spine.  IMPRESSION: Severe multilevel degenerative change. Mild compression deformities at T11 and L1 of uncertain age.   Original Report Authenticated By: Esperanza Heir, M.D.   Dg Hip Complete Left  03/03/2013   *RADIOLOGY REPORT*  Clinical Data: No injury, but with increasing left hip pain and low back pain for 1 day, unable to bear weight  LEFT HIP - COMPLETE 2+ VIEW  Comparison: None.  Findings: There is moderate pubic symphysis situs.  The pelvic bones are intact without fracture.  There is bilateral hip joint narrowing and osteophyte formation consistent with arthritis. There is abnormal contour of the medial lateral cortex at the level of the left femoral neck.  There is a subtle band of lucency and sclerosis across the  femoral neck.  IMPRESSION: A subtle acute or subacute left femoral  neck fracture is suspected. This could be evaluated in better detail with CT or MRI.   Original Report Authenticated By: Esperanza Heir, M.D.   Ct Hip Left Wo Contrast  03/03/2013   *RADIOLOGY REPORT*  Clinical Data: Right hip pain for 1 day with inability to walk.  CT OF THE LEFT HIP WITHOUT CONTRAST  Technique:  Multidetector CT imaging was performed according to the standard protocol. Multiplanar CT image reconstructions were also generated.  Comparison: Radiographs 03/03/2013.  Findings: Severe right hip osteoarthritis is present. Calcification of the acetabular labrum is present.  Hip effusion is present.  There is no femur fracture identified.  Cortical buttressing is present in the inferior left neck.  Visualized left obturator ring appears intact.  Visceral pelvis shows colonic diverticulosis.  Pubic symphysial degenerative disease is better appreciated on plain film.  Atherosclerosis.   If there is a high clinical suspicion of hip fracture (patient refuses to bear weight), MRI is preferred in cases of radiographically occult hip fracture, particularly in osteopenic patients.  While CT is expeditious, evidence is lacking regarding accuracy over radiography.  Linear lucencies are present in the femoral neck compatible with old lag screw fixation with hardware removed.  IMPRESSION: Severe left hip osteoarthritis.  No hip fracture identified.  Left hip effusion is probably degenerative.   Original Report Authenticated By: Andreas Newport, M.D.   Labs Reviewed - No data to display No results found. No diagnosis found. 1. Left hip pain 2. Osteoarthritis  MDM  CT to r/o fracture was negative, showing severe osteoarthritis only. Discussed with patient and family. She has pain medication at home, as well as a walker for assistance with ambulation. She plans to see Dr. Shelle Iron for further management.  Arnoldo Hooker, PA-C 03/03/13 1401

## 2013-03-03 NOTE — ED Notes (Signed)
Patient with chronic back and left hip pain from home, usually ambulates independently without assistive device, came to ED because yesterday she was getting into the car when she felt a sharp pain to her right hip. Patient reports that when she got home the pain prevented her from being able to walk. Patient reports that she manages chronic pain at home with neurontin and oxycodone. PA at bedside.

## 2013-03-05 ENCOUNTER — Other Ambulatory Visit (HOSPITAL_COMMUNITY): Payer: Self-pay | Admitting: Endocrinology

## 2013-03-05 ENCOUNTER — Ambulatory Visit (INDEPENDENT_AMBULATORY_CARE_PROVIDER_SITE_OTHER): Payer: Medicare Other | Admitting: Ophthalmology

## 2013-03-05 DIAGNOSIS — E1139 Type 2 diabetes mellitus with other diabetic ophthalmic complication: Secondary | ICD-10-CM | POA: Diagnosis not present

## 2013-03-05 DIAGNOSIS — I10 Essential (primary) hypertension: Secondary | ICD-10-CM

## 2013-03-05 DIAGNOSIS — E11319 Type 2 diabetes mellitus with unspecified diabetic retinopathy without macular edema: Secondary | ICD-10-CM | POA: Diagnosis not present

## 2013-03-05 DIAGNOSIS — H43819 Vitreous degeneration, unspecified eye: Secondary | ICD-10-CM

## 2013-03-05 DIAGNOSIS — H35039 Hypertensive retinopathy, unspecified eye: Secondary | ICD-10-CM

## 2013-03-16 DIAGNOSIS — H4011X0 Primary open-angle glaucoma, stage unspecified: Secondary | ICD-10-CM | POA: Diagnosis not present

## 2013-03-16 DIAGNOSIS — Z961 Presence of intraocular lens: Secondary | ICD-10-CM | POA: Diagnosis not present

## 2013-03-16 DIAGNOSIS — E11329 Type 2 diabetes mellitus with mild nonproliferative diabetic retinopathy without macular edema: Secondary | ICD-10-CM | POA: Diagnosis not present

## 2013-03-16 DIAGNOSIS — E1139 Type 2 diabetes mellitus with other diabetic ophthalmic complication: Secondary | ICD-10-CM | POA: Diagnosis not present

## 2013-03-22 DIAGNOSIS — I10 Essential (primary) hypertension: Secondary | ICD-10-CM | POA: Diagnosis not present

## 2013-03-22 DIAGNOSIS — R030 Elevated blood-pressure reading, without diagnosis of hypertension: Secondary | ICD-10-CM | POA: Diagnosis not present

## 2013-03-27 DIAGNOSIS — S7000XA Contusion of unspecified hip, initial encounter: Secondary | ICD-10-CM | POA: Diagnosis not present

## 2013-03-27 DIAGNOSIS — M169 Osteoarthritis of hip, unspecified: Secondary | ICD-10-CM | POA: Diagnosis not present

## 2013-03-27 DIAGNOSIS — I10 Essential (primary) hypertension: Secondary | ICD-10-CM | POA: Diagnosis not present

## 2013-04-18 DIAGNOSIS — Z961 Presence of intraocular lens: Secondary | ICD-10-CM | POA: Diagnosis not present

## 2013-04-18 DIAGNOSIS — H1045 Other chronic allergic conjunctivitis: Secondary | ICD-10-CM | POA: Diagnosis not present

## 2013-04-18 DIAGNOSIS — H4011X0 Primary open-angle glaucoma, stage unspecified: Secondary | ICD-10-CM | POA: Diagnosis not present

## 2013-04-20 DIAGNOSIS — H1045 Other chronic allergic conjunctivitis: Secondary | ICD-10-CM | POA: Diagnosis not present

## 2013-04-20 DIAGNOSIS — H4011X0 Primary open-angle glaucoma, stage unspecified: Secondary | ICD-10-CM | POA: Diagnosis not present

## 2013-04-23 DIAGNOSIS — H1045 Other chronic allergic conjunctivitis: Secondary | ICD-10-CM | POA: Diagnosis not present

## 2013-04-24 DIAGNOSIS — M545 Low back pain: Secondary | ICD-10-CM | POA: Diagnosis not present

## 2013-04-30 DIAGNOSIS — Z23 Encounter for immunization: Secondary | ICD-10-CM | POA: Diagnosis not present

## 2013-05-03 DIAGNOSIS — E785 Hyperlipidemia, unspecified: Secondary | ICD-10-CM | POA: Diagnosis not present

## 2013-05-03 DIAGNOSIS — I10 Essential (primary) hypertension: Secondary | ICD-10-CM | POA: Diagnosis not present

## 2013-05-03 DIAGNOSIS — E119 Type 2 diabetes mellitus without complications: Secondary | ICD-10-CM | POA: Diagnosis not present

## 2013-05-03 DIAGNOSIS — Z6825 Body mass index (BMI) 25.0-25.9, adult: Secondary | ICD-10-CM | POA: Diagnosis not present

## 2013-05-03 DIAGNOSIS — M549 Dorsalgia, unspecified: Secondary | ICD-10-CM | POA: Diagnosis not present

## 2013-05-07 DIAGNOSIS — H4011X0 Primary open-angle glaucoma, stage unspecified: Secondary | ICD-10-CM | POA: Diagnosis not present

## 2013-05-07 DIAGNOSIS — Z961 Presence of intraocular lens: Secondary | ICD-10-CM | POA: Diagnosis not present

## 2013-05-15 DIAGNOSIS — H409 Unspecified glaucoma: Secondary | ICD-10-CM | POA: Diagnosis not present

## 2013-05-15 DIAGNOSIS — H4010X0 Unspecified open-angle glaucoma, stage unspecified: Secondary | ICD-10-CM | POA: Diagnosis not present

## 2013-05-16 DIAGNOSIS — M545 Low back pain: Secondary | ICD-10-CM | POA: Diagnosis not present

## 2013-05-23 DIAGNOSIS — R030 Elevated blood-pressure reading, without diagnosis of hypertension: Secondary | ICD-10-CM | POA: Diagnosis not present

## 2013-06-11 DIAGNOSIS — Z961 Presence of intraocular lens: Secondary | ICD-10-CM | POA: Diagnosis not present

## 2013-06-11 DIAGNOSIS — H01009 Unspecified blepharitis unspecified eye, unspecified eyelid: Secondary | ICD-10-CM | POA: Diagnosis not present

## 2013-06-11 DIAGNOSIS — H4010X0 Unspecified open-angle glaucoma, stage unspecified: Secondary | ICD-10-CM | POA: Diagnosis not present

## 2013-06-11 DIAGNOSIS — H168 Other keratitis: Secondary | ICD-10-CM | POA: Diagnosis not present

## 2013-07-16 DIAGNOSIS — M25569 Pain in unspecified knee: Secondary | ICD-10-CM | POA: Diagnosis not present

## 2013-07-16 DIAGNOSIS — M5137 Other intervertebral disc degeneration, lumbosacral region: Secondary | ICD-10-CM | POA: Diagnosis not present

## 2013-07-16 DIAGNOSIS — G894 Chronic pain syndrome: Secondary | ICD-10-CM | POA: Diagnosis not present

## 2013-07-16 DIAGNOSIS — M961 Postlaminectomy syndrome, not elsewhere classified: Secondary | ICD-10-CM | POA: Diagnosis not present

## 2013-07-27 DIAGNOSIS — G894 Chronic pain syndrome: Secondary | ICD-10-CM | POA: Diagnosis not present

## 2013-07-27 DIAGNOSIS — M961 Postlaminectomy syndrome, not elsewhere classified: Secondary | ICD-10-CM | POA: Diagnosis not present

## 2013-07-27 DIAGNOSIS — M25569 Pain in unspecified knee: Secondary | ICD-10-CM | POA: Diagnosis not present

## 2013-08-17 DIAGNOSIS — E119 Type 2 diabetes mellitus without complications: Secondary | ICD-10-CM | POA: Diagnosis not present

## 2013-08-17 DIAGNOSIS — Z6826 Body mass index (BMI) 26.0-26.9, adult: Secondary | ICD-10-CM | POA: Diagnosis not present

## 2013-08-17 DIAGNOSIS — I10 Essential (primary) hypertension: Secondary | ICD-10-CM | POA: Diagnosis not present

## 2013-08-17 DIAGNOSIS — M810 Age-related osteoporosis without current pathological fracture: Secondary | ICD-10-CM | POA: Diagnosis not present

## 2013-08-17 DIAGNOSIS — M549 Dorsalgia, unspecified: Secondary | ICD-10-CM | POA: Diagnosis not present

## 2013-08-17 DIAGNOSIS — E785 Hyperlipidemia, unspecified: Secondary | ICD-10-CM | POA: Diagnosis not present

## 2013-08-27 DIAGNOSIS — M25559 Pain in unspecified hip: Secondary | ICD-10-CM | POA: Diagnosis not present

## 2013-08-27 DIAGNOSIS — M25569 Pain in unspecified knee: Secondary | ICD-10-CM | POA: Diagnosis not present

## 2013-08-30 DIAGNOSIS — H4011X0 Primary open-angle glaucoma, stage unspecified: Secondary | ICD-10-CM | POA: Diagnosis not present

## 2013-08-30 DIAGNOSIS — H409 Unspecified glaucoma: Secondary | ICD-10-CM | POA: Diagnosis not present

## 2013-09-05 DIAGNOSIS — M25559 Pain in unspecified hip: Secondary | ICD-10-CM | POA: Diagnosis not present

## 2013-09-07 ENCOUNTER — Encounter (HOSPITAL_COMMUNITY): Payer: Medicare Other

## 2013-09-14 ENCOUNTER — Encounter (HOSPITAL_COMMUNITY): Admission: RE | Admit: 2013-09-14 | Payer: Medicare Other | Source: Ambulatory Visit

## 2013-09-14 ENCOUNTER — Other Ambulatory Visit (HOSPITAL_COMMUNITY): Payer: Self-pay | Admitting: Internal Medicine

## 2013-09-18 DIAGNOSIS — M810 Age-related osteoporosis without current pathological fracture: Secondary | ICD-10-CM | POA: Diagnosis not present

## 2013-09-18 DIAGNOSIS — Z79899 Other long term (current) drug therapy: Secondary | ICD-10-CM | POA: Diagnosis not present

## 2013-09-18 DIAGNOSIS — Z6826 Body mass index (BMI) 26.0-26.9, adult: Secondary | ICD-10-CM | POA: Diagnosis not present

## 2013-10-01 DIAGNOSIS — M25559 Pain in unspecified hip: Secondary | ICD-10-CM | POA: Diagnosis not present

## 2013-10-04 DIAGNOSIS — H409 Unspecified glaucoma: Secondary | ICD-10-CM | POA: Diagnosis not present

## 2013-10-04 DIAGNOSIS — H4010X0 Unspecified open-angle glaucoma, stage unspecified: Secondary | ICD-10-CM | POA: Diagnosis not present

## 2013-10-04 DIAGNOSIS — Z961 Presence of intraocular lens: Secondary | ICD-10-CM | POA: Diagnosis not present

## 2013-10-12 ENCOUNTER — Ambulatory Visit (HOSPITAL_COMMUNITY)
Admission: RE | Admit: 2013-10-12 | Discharge: 2013-10-12 | Disposition: A | Discharge status: Home / Self Care | Payer: Medicare Other | Source: Ambulatory Visit | Attending: Internal Medicine | Admitting: Internal Medicine

## 2013-10-12 ENCOUNTER — Encounter (HOSPITAL_COMMUNITY): Payer: Self-pay

## 2013-10-12 ENCOUNTER — Other Ambulatory Visit (HOSPITAL_COMMUNITY): Payer: Self-pay | Admitting: Internal Medicine

## 2013-10-12 DIAGNOSIS — M810 Age-related osteoporosis without current pathological fracture: Secondary | ICD-10-CM | POA: Insufficient documentation

## 2013-10-12 MED ORDER — SODIUM CHLORIDE 0.9 % IV SOLN
Freq: Once | INTRAVENOUS | Status: AC
  Administered 2013-10-12: 14:00:00 via INTRAVENOUS

## 2013-10-12 MED ORDER — ZOLEDRONIC ACID 5 MG/100ML IV SOLN
5.0000 mg | Freq: Once | INTRAVENOUS | Status: AC
  Administered 2013-10-12: 5 mg via INTRAVENOUS
  Filled 2013-10-12: qty 100

## 2013-10-12 NOTE — Discharge Instructions (Signed)

## 2013-10-30 DIAGNOSIS — G894 Chronic pain syndrome: Secondary | ICD-10-CM | POA: Diagnosis not present

## 2013-10-30 DIAGNOSIS — M961 Postlaminectomy syndrome, not elsewhere classified: Secondary | ICD-10-CM | POA: Diagnosis not present

## 2013-10-30 DIAGNOSIS — M25569 Pain in unspecified knee: Secondary | ICD-10-CM | POA: Diagnosis not present

## 2013-11-07 DIAGNOSIS — H4010X0 Unspecified open-angle glaucoma, stage unspecified: Secondary | ICD-10-CM | POA: Diagnosis not present

## 2013-11-07 DIAGNOSIS — H409 Unspecified glaucoma: Secondary | ICD-10-CM | POA: Diagnosis not present

## 2013-11-07 DIAGNOSIS — H168 Other keratitis: Secondary | ICD-10-CM | POA: Diagnosis not present

## 2013-11-07 DIAGNOSIS — Z961 Presence of intraocular lens: Secondary | ICD-10-CM | POA: Diagnosis not present

## 2013-11-12 DIAGNOSIS — L821 Other seborrheic keratosis: Secondary | ICD-10-CM | POA: Diagnosis not present

## 2013-11-12 DIAGNOSIS — Z85828 Personal history of other malignant neoplasm of skin: Secondary | ICD-10-CM | POA: Diagnosis not present

## 2013-11-12 DIAGNOSIS — D235 Other benign neoplasm of skin of trunk: Secondary | ICD-10-CM | POA: Diagnosis not present

## 2013-11-19 DIAGNOSIS — G894 Chronic pain syndrome: Secondary | ICD-10-CM | POA: Diagnosis not present

## 2013-11-19 DIAGNOSIS — M961 Postlaminectomy syndrome, not elsewhere classified: Secondary | ICD-10-CM | POA: Diagnosis not present

## 2013-11-19 DIAGNOSIS — Z79899 Other long term (current) drug therapy: Secondary | ICD-10-CM | POA: Diagnosis not present

## 2013-11-19 DIAGNOSIS — M25569 Pain in unspecified knee: Secondary | ICD-10-CM | POA: Diagnosis not present

## 2013-11-20 DIAGNOSIS — E119 Type 2 diabetes mellitus without complications: Secondary | ICD-10-CM | POA: Diagnosis not present

## 2013-11-22 DIAGNOSIS — M25569 Pain in unspecified knee: Secondary | ICD-10-CM | POA: Diagnosis not present

## 2013-11-22 DIAGNOSIS — M1610 Unilateral primary osteoarthritis, unspecified hip: Secondary | ICD-10-CM | POA: Diagnosis not present

## 2013-11-22 DIAGNOSIS — M25559 Pain in unspecified hip: Secondary | ICD-10-CM | POA: Diagnosis not present

## 2013-12-03 ENCOUNTER — Ambulatory Visit (INDEPENDENT_AMBULATORY_CARE_PROVIDER_SITE_OTHER): Payer: Medicare Other | Admitting: Ophthalmology

## 2013-12-03 ENCOUNTER — Encounter: Payer: Self-pay | Admitting: Cardiovascular Disease

## 2013-12-03 ENCOUNTER — Ambulatory Visit (INDEPENDENT_AMBULATORY_CARE_PROVIDER_SITE_OTHER): Payer: Medicare Other | Admitting: Cardiovascular Disease

## 2013-12-03 VITALS — BP 146/68 | HR 69 | Ht 63.0 in | Wt 152.0 lb

## 2013-12-03 DIAGNOSIS — I10 Essential (primary) hypertension: Secondary | ICD-10-CM | POA: Diagnosis not present

## 2013-12-03 DIAGNOSIS — E11319 Type 2 diabetes mellitus with unspecified diabetic retinopathy without macular edema: Secondary | ICD-10-CM

## 2013-12-03 DIAGNOSIS — Z01818 Encounter for other preprocedural examination: Secondary | ICD-10-CM | POA: Diagnosis not present

## 2013-12-03 DIAGNOSIS — H35039 Hypertensive retinopathy, unspecified eye: Secondary | ICD-10-CM | POA: Diagnosis not present

## 2013-12-03 DIAGNOSIS — E1139 Type 2 diabetes mellitus with other diabetic ophthalmic complication: Secondary | ICD-10-CM

## 2013-12-03 DIAGNOSIS — E1165 Type 2 diabetes mellitus with hyperglycemia: Secondary | ICD-10-CM

## 2013-12-03 DIAGNOSIS — E785 Hyperlipidemia, unspecified: Secondary | ICD-10-CM | POA: Diagnosis not present

## 2013-12-03 DIAGNOSIS — H43819 Vitreous degeneration, unspecified eye: Secondary | ICD-10-CM

## 2013-12-03 DIAGNOSIS — E119 Type 2 diabetes mellitus without complications: Secondary | ICD-10-CM | POA: Diagnosis not present

## 2013-12-03 NOTE — Patient Instructions (Signed)
  We will see you back in follow up only as needed.   Dr Gwenlyn Found has ordered a lexiscan Myoview- this is a test that looks at the blood flow to your heart muscle.  It takes approximately 2 1/2 hours. Please follow instruction sheet, as given.

## 2013-12-03 NOTE — Assessment & Plan Note (Signed)
Controlled on current medications 

## 2013-12-03 NOTE — Progress Notes (Signed)
12/03/2013 Marissa Ryan   1924-12-25  [id number]  Primary Physician Jerlyn Ly, MD Primary Cardiologist: Lorretta Harp MD Renae Gloss   HPI:  Marissa Ryan is a 80 year old mildly overweight married Caucasian female with no children (one deceased) referred by Dr. Joylene Draft for cardiovascular clearance prior to elective total hip replacement by Dr. Binnie Rail.her cardiovascular risk factor profile is multiple for treated hypertension, diabetes and hyperlipidemia. He does not smoke. There is no family history. She has never had a heart attack or stroke patient denies chest pain or shortness of breath. She does have back and hip pain and apparently scheduled to have elective total knee replacement. She was referred here for cardiovascular clearance.   Current Outpatient Prescriptions  Medication Sig Dispense Refill  . ALPRAZolam (XANAX) 0.25 MG tablet Take 0.25-0.5 mg by mouth at bedtime as needed for sleep.      Marland Kitchen aspirin 81 MG tablet Take 81 mg by mouth daily.        . Brinzolamide-Brimonidine [facility] OP) Apply to eye 3 (three) times daily.      . calcium carbonate (TUMS - DOSED IN MG ELEMENTAL CALCIUM) 500 MG chewable tablet Chew 1 tablet by mouth as needed.        Wallace Cullens POWD by Does not apply route daily.      Marland Kitchen ezetimibe-simvastatin (VYTORIN) 10-40 MG per tablet Take 1 tablet by mouth at bedtime.       . fish oil-omega-3 fatty acids 1000 MG capsule Take 2 g by mouth daily.        Marland Kitchen gabapentin (NEURONTIN) 300 MG capsule Take 300 mg by mouth 4 (four) times daily.       . Hypromellose (GENTEAL OP) Apply to eye as needed.      . Inulin (FIBER CHOICE PO) Take 1 tablet by mouth daily.      Marland Kitchen losartan (COZAAR) 50 MG tablet Take 50 mg by mouth daily.      . metFORMIN (GLUCOPHAGE) 500 MG tablet Take 500 mg by mouth daily with breakfast.      . Multiple Vitamins-Minerals (CENTRUM) tablet Take 1 tablet by mouth daily.        Marland Kitchen OVER THE COUNTER MEDICATION Take 1-2 tablets  by mouth as needed (for constipation). [facility] brand laxative      . oxyCODONE-acetaminophen (PERCOCET) 10-325 MG per tablet Take 1 tablet by mouth every 4 (four) hours as needed.        Vladimir Faster Glycol-Propyl Glycol (SYSTANE) 0.4-0.3 % SOLN Apply 1 drop to eye as needed (for dry eyes).       . timolol (BETIMOL) 0.5 % ophthalmic solution Place 1 drop into the right eye daily.       . Vitamin D, Ergocalciferol, (DRISDOL) 50000 UNITS CAPS capsule Take 50,000 Units by mouth every 7 (seven) days. Wednesday      . zolpidem (AMBIEN) 10 MG tablet Take 10 mg by mouth at bedtime as needed for sleep.        No current facility-administered medications for this visit.    Allergies  Allergen Reactions  . Ancef [Cefazolin] Diarrhea  . Morphine And Related   . Penicillins     History   Social History  . Marital Status: Married    Spouse Name: N/A    Number of Children: N/A  . Years of Education: N/A   Occupational History  . Not on file.   Social History Main Topics  . Smoking status: Never  Smoker   . Smokeless tobacco: Never Used  . Alcohol Use: No  . Drug Use: No  . Sexual Activity: Not on file   Other Topics Concern  . Not on file   Social History Narrative   3 cups of coffee a day     Review of Systems: General: negative for chills, fever, night sweats or weight changes.  Cardiovascular: negative for chest pain, dyspnea on exertion, edema, orthopnea, palpitations, paroxysmal nocturnal dyspnea or shortness of breath Dermatological: negative for rash Respiratory: negative for cough or wheezing Urologic: negative for hematuria Abdominal: negative for nausea, vomiting, diarrhea, bright red blood per rectum, melena, or hematemesis Neurologic: negative for visual changes, syncope, or dizziness All other systems reviewed and are otherwise negative except as noted above.    Blood pressure 146/68, pulse 69, height 5\' 3"  (1.6 m), weight 152 lb (68.947 kg).  General appearance: alert  and no distress Neck: no adenopathy, no carotid bruit, no JVD, supple, symmetrical, trachea midline and thyroid not enlarged, symmetric, no tenderness/mass/nodules Lungs: clear to auscultation bilaterally Heart: regular rate and rhythm, S1, S2 normal, no murmur, click, rub or gallop Extremities: extremities normal, atraumatic, no cyanosis or edema and 2+ pedal pulses bilaterally  EKG normal sinus rhythm at 69 without ST or T wave changes  ASSESSMENT AND PLAN:   Essential hypertension Controlled on current medications  Hyperlipidemia On statin therapy followed by her PCP      Lorretta Harp MD [facility], [facility] 12/03/2013 5:15 PM

## 2013-12-03 NOTE — Assessment & Plan Note (Signed)
On statin therapy followed by her PCP 

## 2013-12-12 ENCOUNTER — Telehealth (HOSPITAL_COMMUNITY): Payer: Self-pay

## 2013-12-13 DIAGNOSIS — M1610 Unilateral primary osteoarthritis, unspecified hip: Secondary | ICD-10-CM | POA: Diagnosis not present

## 2013-12-19 ENCOUNTER — Ambulatory Visit (HOSPITAL_COMMUNITY)
Admission: RE | Admit: 2013-12-19 | Discharge: 2013-12-19 | Disposition: A | Discharge status: Home / Self Care | Payer: Medicare Other | Source: Ambulatory Visit | Attending: Cardiovascular Disease | Admitting: Cardiovascular Disease

## 2013-12-19 DIAGNOSIS — M25559 Pain in unspecified hip: Secondary | ICD-10-CM | POA: Diagnosis not present

## 2013-12-19 DIAGNOSIS — M79609 Pain in unspecified limb: Secondary | ICD-10-CM | POA: Insufficient documentation

## 2013-12-19 DIAGNOSIS — I10 Essential (primary) hypertension: Secondary | ICD-10-CM | POA: Insufficient documentation

## 2013-12-19 DIAGNOSIS — Z01810 Encounter for preprocedural cardiovascular examination: Secondary | ICD-10-CM

## 2013-12-19 DIAGNOSIS — R5381 Other malaise: Secondary | ICD-10-CM | POA: Insufficient documentation

## 2013-12-19 DIAGNOSIS — E119 Type 2 diabetes mellitus without complications: Secondary | ICD-10-CM | POA: Diagnosis not present

## 2013-12-19 DIAGNOSIS — Z79899 Other long term (current) drug therapy: Secondary | ICD-10-CM | POA: Insufficient documentation

## 2013-12-19 DIAGNOSIS — Z01818 Encounter for other preprocedural examination: Secondary | ICD-10-CM

## 2013-12-19 DIAGNOSIS — R5383 Other fatigue: Secondary | ICD-10-CM

## 2013-12-19 DIAGNOSIS — M549 Dorsalgia, unspecified: Secondary | ICD-10-CM | POA: Insufficient documentation

## 2013-12-19 MED ORDER — REGADENOSON 0.4 MG/5ML IV SOLN
0.4000 mg | Freq: Once | INTRAVENOUS | Status: AC
  Administered 2013-12-19: 0.4 mg via INTRAVENOUS

## 2013-12-19 MED ORDER — TECHNETIUM TC 99M SESTAMIBI GENERIC - CARDIOLITE
30.8000 | Freq: Once | INTRAVENOUS | Status: AC | PRN
  Administered 2013-12-19: 30.8 via INTRAVENOUS

## 2013-12-19 MED ORDER — AMINOPHYLLINE 25 MG/ML IV SOLN
75.0000 mg | Freq: Once | INTRAVENOUS | Status: AC
  Administered 2013-12-19: 75 mg via INTRAVENOUS

## 2013-12-19 MED ORDER — TECHNETIUM TC 99M SESTAMIBI GENERIC - CARDIOLITE
9.9000 | Freq: Once | INTRAVENOUS | Status: AC | PRN
  Administered 2013-12-19: 10 via INTRAVENOUS

## 2013-12-19 NOTE — Procedures (Addendum)
[place] [address] [place] [address] [phone]  Cardiology Nuclear Med Study  Marissa Ryan is a 80 y.o. female     MRN : [id number]     DOB: 1925-02-14  Procedure Date: 12/19/2013  Nuclear Med Background Indication for Stress Test:  Surgical Clearance History:  DVT;No prior cardiac or respiratory history reported;No prior NUC MPI for comparison. Cardiac Risk Factors: Hypertension, Lipids and NIDDM  Symptoms:  Fatigue and back and hip pain   Nuclear Pre-Procedure Caffeine/Decaff Intake:  12:00am NPO After: 10am   IV Site: R Hand  IV 0.9% NS with Angio Cath:  22g  Chest Size (in):  n/a IV Started by: Rolene Course, RN  Height: 5\' 3"  (1.6 m)  Cup Size: B  BMI:  Body mass index is 26.93 kg/(m^2). Weight:  152 lb (68.947 kg)   Tech Comments:  n/a    Nuclear Med Study 1 or 2 day study: 1 day  Stress Test Type:  [place] Provider:  Quay Burow, MD   Resting Radionuclide: Technetium 24m Sestamibi  Resting Radionuclide Dose: 9.9 mCi   Stress Radionuclide:  Technetium 25m Sestamibi  Stress Radionuclide Dose: 30.8 mCi           Stress Protocol Rest HR: 62 Stress HR: 80  Rest BP: 184/78 Stress BP: 178/66  Exercise Time (min): n/a METS: n/a   Predicted Max HR: 134 bpm % Max HR: 67.91 bpm Rate Pressure Product: 16926  Dose of Adenosine (mg):  n/a Dose of Lexiscan: 0.4 mg  Dose of Atropine (mg): n/a Dose of Dobutamine: n/a mcg/kg/min (at max HR)  Stress Test Technologist: Leane Para, CCT Nuclear Technologist: Imagene Riches, CNMT   Rest Procedure:  Myocardial perfusion imaging was performed at rest 45 minutes following the intravenous administration of Technetium 65m Sestamibi. Stress Procedure:  The patient received IV Lexiscan 0.4 mg over 15-seconds.  Technetium 70m Sestamibi injected IV at 30-seconds.  Patient experienced Dizziness and 75 mg of Aminophylline was administered at 5  minutes.  There were no significant changes with Lexiscan.  Quantitative spect images were obtained after a 45 minute delay.  Transient Ischemic Dilatation (Normal <1.22):  0.89 Lung/Heart Ratio (Normal <0.45):  n/a QGS EDV:  41 ml QGS ESV:  10 ml LV Ejection Fraction: 75%       Rest ECG: NSR - Normal EKG  Stress ECG: No significant change from baseline ECG  QPS Raw Data Images:  Normal; no motion artifact; normal heart/lung ratio. Stress Images:  Normal homogeneous uptake in all areas of the myocardium. Rest Images:  Normal homogeneous uptake in all areas of the myocardium. Subtraction (SDS):  Normal  Impression Exercise Capacity:  Lexiscan with no exercise. BP Response:  Normal blood pressure response. Clinical Symptoms:  No significant symptoms noted. ECG Impression:  No significant ST segment change suggestive of ischemia. Comparison with Prior Nuclear Study: No images to compare  Overall Impression:  Normal stress nuclear study.  LV Wall Motion:  NL LV Function, EF 75%; NL Wall Motion   Troy Sine, MD  12/19/2013 6:48 PM

## 2013-12-20 DIAGNOSIS — Z79899 Other long term (current) drug therapy: Secondary | ICD-10-CM | POA: Diagnosis not present

## 2013-12-20 DIAGNOSIS — M961 Postlaminectomy syndrome, not elsewhere classified: Secondary | ICD-10-CM | POA: Diagnosis not present

## 2013-12-21 DIAGNOSIS — B029 Zoster without complications: Secondary | ICD-10-CM | POA: Diagnosis not present

## 2013-12-25 ENCOUNTER — Encounter: Payer: Self-pay | Admitting: *Deleted

## 2013-12-28 ENCOUNTER — Telehealth: Payer: Self-pay | Admitting: Cardiovascular Disease

## 2013-12-28 NOTE — Telephone Encounter (Signed)
Is asking for the results of her Stress Test .. Please Call   Thanks

## 2013-12-28 NOTE — Telephone Encounter (Signed)
Normal nuc results given to patient.  She has not received the letter sent out with the results yet.  I apologized for this. Patient voiced understanding.

## 2014-01-09 DIAGNOSIS — Z961 Presence of intraocular lens: Secondary | ICD-10-CM | POA: Diagnosis not present

## 2014-01-09 DIAGNOSIS — E785 Hyperlipidemia, unspecified: Secondary | ICD-10-CM | POA: Diagnosis not present

## 2014-01-09 DIAGNOSIS — I10 Essential (primary) hypertension: Secondary | ICD-10-CM | POA: Diagnosis not present

## 2014-01-09 DIAGNOSIS — M810 Age-related osteoporosis without current pathological fracture: Secondary | ICD-10-CM | POA: Diagnosis not present

## 2014-01-09 DIAGNOSIS — H168 Other keratitis: Secondary | ICD-10-CM | POA: Diagnosis not present

## 2014-01-09 DIAGNOSIS — H409 Unspecified glaucoma: Secondary | ICD-10-CM | POA: Diagnosis not present

## 2014-01-09 DIAGNOSIS — E119 Type 2 diabetes mellitus without complications: Secondary | ICD-10-CM | POA: Diagnosis not present

## 2014-01-09 DIAGNOSIS — H4010X0 Unspecified open-angle glaucoma, stage unspecified: Secondary | ICD-10-CM | POA: Diagnosis not present

## 2014-01-11 DIAGNOSIS — M961 Postlaminectomy syndrome, not elsewhere classified: Secondary | ICD-10-CM | POA: Diagnosis not present

## 2014-01-11 DIAGNOSIS — Z79899 Other long term (current) drug therapy: Secondary | ICD-10-CM | POA: Diagnosis not present

## 2014-01-11 DIAGNOSIS — G894 Chronic pain syndrome: Secondary | ICD-10-CM | POA: Diagnosis not present

## 2014-01-11 DIAGNOSIS — M25819 Other specified joint disorders, unspecified shoulder: Secondary | ICD-10-CM | POA: Diagnosis not present

## 2014-01-16 DIAGNOSIS — M479 Spondylosis, unspecified: Secondary | ICD-10-CM | POA: Diagnosis not present

## 2014-01-16 DIAGNOSIS — Z6826 Body mass index (BMI) 26.0-26.9, adult: Secondary | ICD-10-CM | POA: Diagnosis not present

## 2014-01-16 DIAGNOSIS — Z0000 Encounter for general adult medical examination without abnormal findings: Secondary | ICD-10-CM | POA: Diagnosis not present

## 2014-01-16 DIAGNOSIS — E119 Type 2 diabetes mellitus without complications: Secondary | ICD-10-CM | POA: Diagnosis not present

## 2014-01-16 DIAGNOSIS — R809 Proteinuria, unspecified: Secondary | ICD-10-CM | POA: Diagnosis not present

## 2014-01-16 DIAGNOSIS — E785 Hyperlipidemia, unspecified: Secondary | ICD-10-CM | POA: Diagnosis not present

## 2014-01-16 DIAGNOSIS — R030 Elevated blood-pressure reading, without diagnosis of hypertension: Secondary | ICD-10-CM | POA: Diagnosis not present

## 2014-01-16 DIAGNOSIS — M549 Dorsalgia, unspecified: Secondary | ICD-10-CM | POA: Diagnosis not present

## 2014-01-17 DIAGNOSIS — M961 Postlaminectomy syndrome, not elsewhere classified: Secondary | ICD-10-CM | POA: Diagnosis not present

## 2014-01-17 DIAGNOSIS — Z79899 Other long term (current) drug therapy: Secondary | ICD-10-CM | POA: Diagnosis not present

## 2014-01-17 DIAGNOSIS — M25819 Other specified joint disorders, unspecified shoulder: Secondary | ICD-10-CM | POA: Diagnosis not present

## 2014-01-17 NOTE — Telephone Encounter (Signed)
Encounter complete. 

## 2014-01-21 DIAGNOSIS — Z1212 Encounter for screening for malignant neoplasm of rectum: Secondary | ICD-10-CM | POA: Diagnosis not present

## 2014-01-21 DIAGNOSIS — M25819 Other specified joint disorders, unspecified shoulder: Secondary | ICD-10-CM | POA: Diagnosis not present

## 2014-01-23 DIAGNOSIS — L259 Unspecified contact dermatitis, unspecified cause: Secondary | ICD-10-CM | POA: Diagnosis not present

## 2014-01-29 DIAGNOSIS — Z23 Encounter for immunization: Secondary | ICD-10-CM | POA: Diagnosis not present

## 2014-01-31 ENCOUNTER — Other Ambulatory Visit: Payer: Self-pay | Admitting: Orthopedic Surgery

## 2014-02-04 DIAGNOSIS — M1610 Unilateral primary osteoarthritis, unspecified hip: Secondary | ICD-10-CM | POA: Diagnosis not present

## 2014-02-06 ENCOUNTER — Encounter (HOSPITAL_COMMUNITY): Payer: Self-pay | Admitting: Pharmacy Technician

## 2014-02-06 DIAGNOSIS — L259 Unspecified contact dermatitis, unspecified cause: Secondary | ICD-10-CM | POA: Diagnosis not present

## 2014-02-06 DIAGNOSIS — I872 Venous insufficiency (chronic) (peripheral): Secondary | ICD-10-CM | POA: Diagnosis not present

## 2014-02-11 NOTE — Patient Instructions (Signed)
Marissa Ryan  02/11/2014   Your procedure is scheduled on:  02/20/2014  315pm-500pm  Report to [facility].  Follow the Signs to [place] at 1245  Call this number if you have problems the morning of surgery: [phone]   Remember:   Do not eat food after midnite.  May have clear liquids until 0830 am then npo.    Take these medicines the morning of surgery with A SIP OF WATER:    Do not wear jewelry, make-up or nail polish.  Do not wear lotions, powders, or perfumes.deodorant.    Do not shave 48 hours prior to surgery.   Do not bring valuables to the hospital.  Contacts, dentures or bridgework may not be worn into surgery.  Leave suitcase in the car. After surgery it may be brought to your room.  For patients admitted to the hospital, checkout time is 11:00 AM the day of  discharge.       CLEAR LIQUID DIET   Foods Allowed                                                                     Foods Excluded  Coffee and tea, regular and decaf                             liquids that you cannot  Plain Jell-O in any flavor                                             see through such as: Fruit ices (not with fruit pulp)                                     milk, soups, orange juice  Iced Popsicles                                    All solid food Carbonated beverages, regular and diet                                    Cranberry, grape and apple juices Sports drinks like Gatorade Lightly seasoned clear broth or consume(fat free) Sugar, honey syrup  Sample Menu Breakfast                                Lunch                                     Supper Cranberry juice                    Beef broth  Chicken broth Jell-O                                     Grape juice                           Apple juice Coffee or tea                        Jell-O                                      Popsicle                                                 Coffee or tea                        Coffee or tea  _____________________________________________________________________  [facility] - Preparing for Surgery Before surgery, you can play an important role.  Because skin is not sterile, your skin needs to be as free of germs as possible.  You can reduce the number of germs on your skin by washing with CHG (chlorahexidine gluconate) soap before surgery.  CHG is an antiseptic cleaner which kills germs and bonds with the skin to continue killing germs even after washing. Please DO NOT use if you have an allergy to CHG or antibacterial soaps.  If your skin becomes reddened/irritated stop using the CHG and inform your nurse when you arrive at Short Stay. Do not shave (including legs and underarms) for at least 48 hours prior to the first CHG shower.  You may shave your face/neck. Please follow these instructions carefully:  1.  Shower with CHG Soap the night before surgery and the  morning of Surgery.  2.  If you choose to wash your hair, wash your hair first as usual with your  normal  shampoo.  3.  After you shampoo, rinse your hair and body thoroughly to remove the  shampoo.                           4.  Use CHG as you would any other liquid soap.  You can apply chg directly  to the skin and wash                       Gently with a scrungie or clean washcloth.  5.  Apply the CHG Soap to your body ONLY FROM THE NECK DOWN.   Do not use on face/ open                           Wound or open sores. Avoid contact with eyes, ears mouth and genitals (private parts).                       Wash face,  Genitals (private parts) with your normal soap.             6.  Wash thoroughly, paying special attention to the area  where your surgery  will be performed.  7.  Thoroughly rinse your body with warm water from the neck down.  8.  DO NOT shower/wash with your normal soap after using and rinsing off  the CHG Soap.                9.  Pat yourself dry with a clean  towel.            10.  Wear clean pajamas.            11.  Place clean sheets on your bed the night of your first shower and do not  sleep with pets. Day of Surgery : Do not apply any lotions/deodorants the morning of surgery.  Please wear clean clothes to the hospital/surgery center.  FAILURE TO FOLLOW THESE INSTRUCTIONS MAY RESULT IN THE CANCELLATION OF YOUR SURGERY PATIENT SIGNATURE_________________________________  NURSE SIGNATURE__________________________________  ________________________________________________________________________   Marissa Ryan  An incentive spirometer is a tool that can help keep your lungs clear and active. This tool measures how well you are filling your lungs with each breath. Taking long deep breaths may help reverse or decrease the chance of developing breathing (pulmonary) problems (especially infection) following:  A long period of time when you are unable to move or be active. BEFORE THE PROCEDURE   If the spirometer includes an indicator to show your best effort, your nurse or respiratory therapist will set it to a desired goal.  If possible, sit up straight or lean slightly forward. Try not to slouch.  Hold the incentive spirometer in an upright position. INSTRUCTIONS FOR USE  1. Sit on the edge of your bed if possible, or sit up as far as you can in bed or on a chair. 2. Hold the incentive spirometer in an upright position. 3. Breathe out normally. 4. Place the mouthpiece in your mouth and seal your lips tightly around it. 5. Breathe in slowly and as deeply as possible, raising the piston or the ball toward the top of the column. 6. Hold your breath for 3-5 seconds or for as long as possible. Allow the piston or ball to fall to the bottom of the column. 7. Remove the mouthpiece from your mouth and breathe out normally. 8. Rest for a few seconds and repeat Steps 1 through 7 at least 10 times every 1-2 hours when you are awake. Take your  time and take a few normal breaths between deep breaths. 9. The spirometer may include an indicator to show your best effort. Use the indicator as a goal to work toward during each repetition. 10. After each set of 10 deep breaths, practice coughing to be sure your lungs are clear. If you have an incision (the cut made at the time of surgery), support your incision when coughing by placing a pillow or rolled up towels firmly against it. Once you are able to get out of bed, walk around indoors and cough well. You may stop using the incentive spirometer when instructed by your caregiver.  RISKS AND COMPLICATIONS  Take your time so you do not get dizzy or light-headed.  If you are in pain, you may need to take or ask for pain medication before doing incentive spirometry. It is harder to take a deep breath if you are having pain. AFTER USE  Rest and breathe slowly and easily.  It can be helpful to keep track of a log of your progress. Your caregiver can provide you with a simple  table to help with this. If you are using the spirometer at home, follow these instructions: [place] IF:   You are having difficultly using the spirometer.  You have trouble using the spirometer as often as instructed.  Your pain medication is not giving enough relief while using the spirometer.  You develop fever of 100.5 F (38.1 C) or higher. SEEK IMMEDIATE MEDICAL CARE IF:   You cough up bloody sputum that had not been present before.  You develop fever of 102 F (38.9 C) or greater.  You develop worsening pain at or near the incision site. MAKE SURE YOU:   Understand these instructions.  Will watch your condition.  Will get help right away if you are not doing well or get worse. Document Released: 11/15/2006 Document Revised: 09/27/2011 Document Reviewed: 01/16/2007 ExitCare Patient Information 2014 ExitCare,  [place].   ________________________________________________________________________  WHAT IS A BLOOD TRANSFUSION? Blood Transfusion Information  A transfusion is the replacement of blood or some of its parts. Blood is made up of multiple cells which provide different functions.  Red blood cells carry oxygen and are used for blood loss replacement.  White blood cells fight against infection.  Platelets control bleeding.  Plasma helps clot blood.  Other blood products are available for specialized needs, such as hemophilia or other clotting disorders. BEFORE THE TRANSFUSION  Who gives blood for transfusions?   Healthy volunteers who are fully evaluated to make sure their blood is safe. This is blood bank blood. Transfusion therapy is the safest it has ever been in the practice of medicine. Before blood is taken from a donor, a complete history is taken to make sure that person has no history of diseases nor engages in risky social behavior (examples are intravenous drug use or sexual activity with multiple partners). The donor's travel history is screened to minimize risk of transmitting infections, such as malaria. The donated blood is tested for signs of infectious diseases, such as HIV and hepatitis. The blood is then tested to be sure it is compatible with you in order to minimize the chance of a transfusion reaction. If you or a relative donates blood, this is often done in anticipation of surgery and is not appropriate for emergency situations. It takes many days to process the donated blood. RISKS AND COMPLICATIONS Although transfusion therapy is very safe and saves many lives, the main dangers of transfusion include:   Getting an infectious disease.  Developing a transfusion reaction. This is an allergic reaction to something in the blood you were given. Every precaution is taken to prevent this. The decision to have a blood transfusion has been considered carefully by your caregiver  before blood is given. Blood is not given unless the benefits outweigh the risks. AFTER THE TRANSFUSION  Right after receiving a blood transfusion, you will usually feel much better and more energetic. This is especially true if your red blood cells have gotten low (anemic). The transfusion raises the level of the red blood cells which carry oxygen, and this usually causes an energy increase.  The nurse administering the transfusion will monitor you carefully for complications. HOME CARE INSTRUCTIONS  No special instructions are needed after a transfusion. You may find your energy is better. Speak with your caregiver about any limitations on activity for underlying diseases you may have. SEEK MEDICAL CARE IF:   Your condition is not improving after your transfusion.  You develop redness or irritation at the intravenous (IV) site. [place]  CARE IF:  Any of the following symptoms occur over the next 12 hours:  Shaking chills.  You have a temperature by mouth above 102 F (38.9 C), not controlled by medicine.  Chest, back, or muscle pain.  People around you feel you are not acting correctly or are confused.  Shortness of breath or difficulty breathing.  Dizziness and fainting.  You get a rash or develop hives.  You have a decrease in urine output.  Your urine turns a dark color or changes to pink, red, or brown. Any of the following symptoms occur over the next 10 days:  You have a temperature by mouth above 102 F (38.9 C), not controlled by medicine.  Shortness of breath.  Weakness after normal activity.  The white part of the eye turns yellow (jaundice).  You have a decrease in the amount of urine or are urinating less often.  Your urine turns a dark color or changes to pink, red, or brown. Document Released: 07/02/2000 Document Revised: 09/27/2011 Document Reviewed: 02/19/2008 ExitCare Patient Information 2014 ExitCare,  [place].  _______________________________________________________________________   Please read over the following fact sheets that you were given: MRSA Information, coughing and deep breathing exercises, leg exercises

## 2014-02-12 ENCOUNTER — Encounter (HOSPITAL_COMMUNITY): Payer: Self-pay

## 2014-02-12 ENCOUNTER — Ambulatory Visit (HOSPITAL_COMMUNITY)
Admission: RE | Admit: 2014-02-12 | Discharge: 2014-02-12 | Disposition: A | Discharge status: Home / Self Care | Payer: Medicare Other | Source: Ambulatory Visit | Attending: Orthopedic Surgery | Admitting: Orthopedic Surgery

## 2014-02-12 ENCOUNTER — Encounter (HOSPITAL_COMMUNITY)
Admission: RE | Admit: 2014-02-12 | Discharge: 2014-02-12 | Disposition: A | Discharge status: Home / Self Care | Payer: Medicare Other | Source: Ambulatory Visit | Attending: Orthopedic Surgery | Admitting: Orthopedic Surgery

## 2014-02-12 DIAGNOSIS — M47814 Spondylosis without myelopathy or radiculopathy, thoracic region: Secondary | ICD-10-CM | POA: Insufficient documentation

## 2014-02-12 DIAGNOSIS — J984 Other disorders of lung: Secondary | ICD-10-CM | POA: Insufficient documentation

## 2014-02-12 DIAGNOSIS — M4120 Other idiopathic scoliosis, site unspecified: Secondary | ICD-10-CM | POA: Diagnosis not present

## 2014-02-12 DIAGNOSIS — Z01812 Encounter for preprocedural laboratory examination: Secondary | ICD-10-CM | POA: Diagnosis not present

## 2014-02-12 DIAGNOSIS — Z01818 Encounter for other preprocedural examination: Secondary | ICD-10-CM | POA: Insufficient documentation

## 2014-02-12 DIAGNOSIS — M1610 Unilateral primary osteoarthritis, unspecified hip: Secondary | ICD-10-CM | POA: Insufficient documentation

## 2014-02-12 DIAGNOSIS — M47817 Spondylosis without myelopathy or radiculopathy, lumbosacral region: Secondary | ICD-10-CM | POA: Diagnosis not present

## 2014-02-12 DIAGNOSIS — M169 Osteoarthritis of hip, unspecified: Secondary | ICD-10-CM | POA: Diagnosis not present

## 2014-02-12 HISTORY — DX: Malignant (primary) neoplasm, unspecified: C80.1

## 2014-02-12 HISTORY — DX: Type 2 diabetes mellitus without complications: E11.9

## 2014-02-12 LAB — URINALYSIS, ROUTINE W REFLEX MICROSCOPIC
Bilirubin Urine: NEGATIVE
Glucose, UA: NEGATIVE mg/dL
Hgb urine dipstick: NEGATIVE
Ketones, ur: NEGATIVE mg/dL
Nitrite: NEGATIVE
Protein, ur: NEGATIVE mg/dL
Specific Gravity, Urine: 1.012 (ref 1.005–1.030)
UROBILINOGEN UA: 0.2 mg/dL (ref 0.0–1.0)
pH: 5.5 (ref 5.0–8.0)

## 2014-02-12 LAB — COMPREHENSIVE METABOLIC PANEL
ALT: 18 U/L (ref 0–35)
AST: 21 U/L (ref 0–37)
Albumin: 3.8 g/dL (ref 3.5–5.2)
Alkaline Phosphatase: 51 U/L (ref 39–117)
Anion gap: 10 (ref 5–15)
BUN: 10 mg/dL (ref 6–23)
CALCIUM: 10.1 mg/dL (ref 8.4–10.5)
CO2: 28 mEq/L (ref 19–32)
CREATININE: 0.75 mg/dL (ref 0.50–1.10)
Chloride: 100 mEq/L (ref 96–112)
GFR calc non Af Amer: 75 mL/min — ABNORMAL LOW (ref >90)
GFR, EST AFRICAN AMERICAN: 86 mL/min — AB (ref >90)
GLUCOSE: 145 mg/dL — AB (ref 70–99)
Potassium: 4.8 mEq/L (ref 3.7–5.3)
Sodium: 138 mEq/L (ref 137–147)
Total Bilirubin: 0.2 mg/dL — ABNORMAL LOW (ref 0.3–1.2)
Total Protein: 7.1 g/dL (ref 6.0–8.3)

## 2014-02-12 LAB — CBC
HCT: 40.2 % (ref 36.0–46.0)
HEMOGLOBIN: 12.7 g/dL (ref 12.0–15.0)
MCH: 31.4 pg (ref 26.0–34.0)
MCHC: 31.6 g/dL (ref 30.0–36.0)
MCV: 99.5 fL (ref 78.0–100.0)
Platelets: 336 10*3/uL (ref 150–400)
RBC: 4.04 MIL/uL (ref 3.87–5.11)
RDW: 13.4 % (ref 11.5–15.5)
WBC: 8.2 10*3/uL (ref 4.0–10.5)

## 2014-02-12 LAB — SURGICAL PCR SCREEN
MRSA, PCR: NEGATIVE
Staphylococcus aureus: NEGATIVE

## 2014-02-12 LAB — URINE MICROSCOPIC-ADD ON

## 2014-02-12 LAB — PROTIME-INR
INR: 0.9 (ref 0.00–1.49)
PROTHROMBIN TIME: 12.2 s (ref 11.6–15.2)

## 2014-02-12 LAB — APTT: aPTT: 29 seconds (ref 24–37)

## 2014-02-12 NOTE — Progress Notes (Signed)
Urinalysis with micro results faxed via EPIC to DR [name].

## 2014-02-12 NOTE — Progress Notes (Addendum)
Stress Test in EPIC- 12/19/2013  Last office visit with Dr Gwenlyn Found on 12/03/13 EPIC EKG- 12/03/13 EPIC

## 2014-02-13 DIAGNOSIS — M810 Age-related osteoporosis without current pathological fracture: Secondary | ICD-10-CM | POA: Diagnosis not present

## 2014-02-17 ENCOUNTER — Other Ambulatory Visit: Payer: Self-pay | Admitting: Orthopedic Surgery

## 2014-02-17 NOTE — H&P (Signed)
Marissa Ryan DOB: 1924/12/08 Married / Language: English / Race: White Female Date of Admission:  02-20-2014 Chief Complaint:  Left Hip Pain History of Present Illness  The patient is a 80 year old female who comes in  for a preoperative History and Physical. The patient is scheduled for a left total hip arthroplasty (Anterior Approach) to be performed by Dr. Dione Plover. [name], MD at [facility] on 02/20/2014. The patient is a 80 year old female who presents with a left hip problem. The patient was seen in referral from Dr. [name].The patient reports left hip problems including pain symptoms that have been present for month(s). The symptoms began without any known injury. Symptoms reported include hip pain and difficulty ambulating The patient reports symptoms radiating to the: left groin and left thigh anteriorly. Previous treatment for this problem has included corticosteroid injection (injection on 09-05-13 helped for a few weeks). The patient reports left knee symptoms including: pain and swelling .The patient feels that the symptoms are worsening. The patient has the current diagnosis of knee osteoarthritis. Previous work-up for this problem has included knee x-rays. Past treatment for this problem has included intra-articular injection of corticosteroids (4 weeks ago by Dr. [name]). Current treatment includes opioid analgesics (Oxycodone; she was given a prescription for Oxycontin at her last visit with Dr. [name]. She reports she is unable to take that.). [name] comes in for evaluation of the left hip. It's been going on for quite some time now but has gotten worse over the past 2 months. She denies any injury, but it's reached a point where it's just about constant. She's been treated by Dr. [name] for pain for some time now. She's been on Oxycodone which does help while she's taking it. Due to the pain, she's been walking slowly. She has had some groin pain, also occasional buttock pain on  that side. Sometimes pain will go down into the thigh and into the knee with occasional calf pain. She has noticed that it has become more difficult to get in and out of the car. She only has a few steps at home, and she makes sure she holds onto the railing when going up. She leads with the other foot, though, trying to go one at a time, being more cautious with the left leg. She has also noticed it is a little bit more difficult to get shoes and socks on. She cannot cross that leg over. She denies any numbness or tingling going down the leg. It's mainly just pain. There is no popping or buckling. The pain does radiate down into the knee. It sounds like most of this issue is ongoing about the hip. She is ready to proceed with hip surgery at this time. They have been treated conservatively in the past for the above stated problem and despite conservative measures, they continue to have progressive pain and severe functional limitations and dysfunction. They have failed non-operative management including home exercise, medications, and injections. It is felt that they would benefit from undergoing total joint replacement. Risks and benefits of the procedure have been discussed with the patient and they elect to proceed with surgery. There are no active contraindications to surgery such as ongoing infection or rapidly progressive neurological disease.  Allergies  Penicillin G Sodium *PENICILLINS* Itching. Morphine Sulfate (Concentrate) *ANALGESICS - OPIOID* Itching.  Intolerance NSAIDs Diarrhea.  Problem List/Past Medical  Osteoarthritis of left hip (715.95  M16.12) Cervical disc degeneration (722.4) Lumbar disc degeneration (722.52  M51.36) Encounter for long-term use of opiate analgesic (V58.69  Z79.891) Impingement syndrome of right shoulder (726.2  M75.41) Postlaminectomy syndrome of lumbar region (722.83  M96.1) Chronic pain disorder (338.4  G89.4) High blood  pressure Hypercholesterolemia Osteoarthritis Osteoporosis Osteoarthritis of left knee (715.96  M17.9)05/12/2001 Impaired Vision Impaired Hearing Glaucoma Non-Insulin Dependent Diabetes Mellitus Degenerative Disc Disease  Family History  First Degree Relatives Heart Disease brother Cancer First Degree Relatives. sister Mother Myocardial infarction.  Social History Tobacco use Never smoker. never smoker Drug/Alcohol Rehab (Currently) no Living situation live with spouse Current work status retired No alcohol use Drug/Alcohol Rehab (Previously) no Exercise Exercises never; does running / walking Alcohol use current drinker; drinks wine; only occasionally per week Children 0 Illicit drug use yes Marital status married Number of flights of stairs before winded less than 1 Pain Contract yes Tobacco / smoke exposure no Post-Surgical Plans Home Advance Directives Living Will, Healthcare POA  Medication History  Gabapentin (300MG  Capsule, 1 Oral five times per day, Taken starting 01/11/2014) Active. Percocet (10-325MG  Tablet, 1 Oral five times per day prn pain, Taken starting 12/20/2013) Active. MetFORMIN HCl (500MG  Tablet, Oral) Active. Simbrinza (1-0.2% Suspension, Ophthalmic) Active. ALPRAZolam (0.25MG  Tablet, Oral) Active. Fish Oil Active. ALPRAZolam (1MG  Tablet, Oral) Active. Benazepril-Hydrochlorothiazide (Oral) Specific dose unknown - Active. Travatan (Ophthalmic) Specific dose unknown - Active. Aspirin (81MG  Tablet, Oral) Active. Vytorin (10-80MG  Tablet, Oral) Active. Vitamins & Minerals (Oral) Active. Zolpidem Tartrate (10MG  Tablet, Oral) Active.  Past Surgical History  Gallbladder Surgery Date: 06/1998. laporoscopic Cataract Surgery Date: 09/1998. bilateral Hip Fracture and Surgery Date: 1978. left Rotator Cuff Repair Date: 05/2010. left Spinal Surgery Date: 06/1997. Appendectomy Foot Surgery Date:  12/2001. Left Wrist Surgery Date: 04/2003. Right Hand Surgery for Cancer Date: 04/2012.   Review of Systems General Not Present- Chills, Fatigue, Fever, Memory Loss, Night Sweats, Weight Gain and Weight Loss. Skin Not Present- Eczema, Hives, Itching, Lesions and Rash. HEENT Present- Hearing Loss. Not Present- Dentures, Double Vision, Headache, Tinnitus and Visual Loss. Respiratory Not Present- Allergies, Chronic Cough, Coughing up blood, Shortness of breath at rest and Shortness of breath with exertion. Cardiovascular Not Present- Chest Pain, Difficulty Breathing Lying Down, Murmur, Palpitations, Racing/skipping heartbeats and Swelling. Gastrointestinal Not Present- Abdominal Pain, Bloody Stool, Constipation, Diarrhea, Difficulty Swallowing, Heartburn, Jaundice, Loss of appetitie, Nausea and Vomiting. Female Genitourinary Not Present- Blood in Urine, Discharge, Flank Pain, Incontinence, Painful Urination, Urgency, Urinary frequency, Urinary Retention, Urinating at Night and Weak urinary stream. Musculoskeletal Present- Back Pain. Not Present- Joint Pain, Joint Swelling, Morning Stiffness, Muscle Pain, Muscle Weakness and Spasms. Neurological Not Present- Blackout spells, Difficulty with balance, Dizziness, Paralysis, Tremor and Weakness. Psychiatric Not Present- Insomnia.   Vitals Weight: 145 lb Height: 62in Weight was reported by patient. Height was reported by patient. Body Surface Area: 1.7 m Body Mass Index: 26.52 kg/m Pulse: 56 (Regular)  Resp.: 14 (Unlabored)  BP: 139/60 (Sitting, Right Arm, Standard)    Physical Exam General Mental Status -Alert, cooperative and good historian. General Appearance-pleasant, Not in acute distress. Orientation-Oriented X3. Build & Nutrition-Well nourished and Well developed.  Head and Neck Head-normocephalic, atraumatic . Neck Global Assessment - supple, no bruit auscultated on the right, no bruit auscultated on the  left. Note: bilateral hearing aids   Eye Vision-Wears corrective lenses. Pupil - Bilateral-Regular and Round. Motion - Bilateral-EOMI.  Chest and Lung Exam Auscultation Breath sounds - clear at anterior chest wall and clear at posterior chest wall. Adventitious sounds - No Adventitious sounds.  Cardiovascular Auscultation Rhythm -  Regular rate and rhythm. Heart Sounds - S1 WNL and S2 WNL. Murmurs & Other Heart Sounds - Auscultation of the heart reveals - No Murmurs.  Abdomen Palpation/Percussion Tenderness - Abdomen is non-tender to palpation. Rigidity (guarding) - Abdomen is soft. Auscultation Auscultation of the abdomen reveals - Bowel sounds normal.  Female Genitourinary Note: Not done, not pertinent to present illness   Musculoskeletal Note: Well developed female, alert and oriented in no apparent distress. Her left hip can be flexed to 90. No internal rotation, about 10-15 of external rotation, 20 of abduction. Right hip has normal motion without discomfort.  RADIOGRAPHS: Radiographs, AP pelvis, lateral of the hip. She has bone on bone arthritis throughout the hip joint with some subchondral cystic changes.  Assessment & Plan Osteoarthritis of left hip (715.95  M16.12) Note:Plan is for a Left Total Hip Replacement - anterior approach by Dr. Wynelle Link.  Plan is to go home  PCP - Dr. Joylene Draft  The patient does not have any contraindications and will receive TXA (tranexamic acid) prior to surgery.  Signed electronically by Ok Edwards, III PA-C

## 2014-02-19 NOTE — Progress Notes (Signed)
Patient  Called and was confused about arrival time.  Asked patient to obtain preop instruction sheet .  Reviewed preop instructions with patient.  Patient voiced understanding. Patient aware not to bring medications from home and only to bring eye drops.  Patient voiced understanding.

## 2014-02-20 ENCOUNTER — Inpatient Hospital Stay (HOSPITAL_COMMUNITY)
Admission: RE | Admit: 2014-02-20 | Discharge: 2014-02-25 | DRG: 470 | Disposition: A | Discharge status: Skilled Nursing Facility | Payer: Medicare Other | Source: Ambulatory Visit | Attending: Orthopedic Surgery | Admitting: Orthopedic Surgery

## 2014-02-20 ENCOUNTER — Encounter (HOSPITAL_COMMUNITY)
Admission: RE | Disposition: A | Discharge status: Skilled Nursing Facility | Payer: Self-pay | Source: Ambulatory Visit | Attending: Orthopedic Surgery

## 2014-02-20 ENCOUNTER — Encounter (HOSPITAL_COMMUNITY): Payer: Medicare Other | Admitting: Anesthesiology

## 2014-02-20 ENCOUNTER — Inpatient Hospital Stay (HOSPITAL_COMMUNITY): Payer: Medicare Other

## 2014-02-20 ENCOUNTER — Inpatient Hospital Stay (HOSPITAL_COMMUNITY): Payer: Medicare Other | Admitting: Anesthesiology

## 2014-02-20 ENCOUNTER — Encounter (HOSPITAL_COMMUNITY): Payer: Self-pay | Admitting: *Deleted

## 2014-02-20 DIAGNOSIS — Z96649 Presence of unspecified artificial hip joint: Secondary | ICD-10-CM | POA: Diagnosis not present

## 2014-02-20 DIAGNOSIS — H9190 Unspecified hearing loss, unspecified ear: Secondary | ICD-10-CM | POA: Diagnosis present

## 2014-02-20 DIAGNOSIS — M1710 Unilateral primary osteoarthritis, unspecified knee: Secondary | ICD-10-CM | POA: Diagnosis present

## 2014-02-20 DIAGNOSIS — Z7982 Long term (current) use of aspirin: Secondary | ICD-10-CM

## 2014-02-20 DIAGNOSIS — Z8249 Family history of ischemic heart disease and other diseases of the circulatory system: Secondary | ICD-10-CM

## 2014-02-20 DIAGNOSIS — M25559 Pain in unspecified hip: Secondary | ICD-10-CM | POA: Diagnosis not present

## 2014-02-20 DIAGNOSIS — E119 Type 2 diabetes mellitus without complications: Secondary | ICD-10-CM | POA: Diagnosis not present

## 2014-02-20 DIAGNOSIS — I10 Essential (primary) hypertension: Secondary | ICD-10-CM | POA: Diagnosis present

## 2014-02-20 DIAGNOSIS — Z5189 Encounter for other specified aftercare: Secondary | ICD-10-CM | POA: Diagnosis not present

## 2014-02-20 DIAGNOSIS — M1610 Unilateral primary osteoarthritis, unspecified hip: Secondary | ICD-10-CM | POA: Diagnosis not present

## 2014-02-20 DIAGNOSIS — IMO0002 Reserved for concepts with insufficient information to code with codable children: Secondary | ICD-10-CM | POA: Diagnosis not present

## 2014-02-20 DIAGNOSIS — E785 Hyperlipidemia, unspecified: Secondary | ICD-10-CM | POA: Diagnosis present

## 2014-02-20 DIAGNOSIS — K589 Irritable bowel syndrome without diarrhea: Secondary | ICD-10-CM | POA: Diagnosis not present

## 2014-02-20 DIAGNOSIS — M169 Osteoarthritis of hip, unspecified: Secondary | ICD-10-CM | POA: Diagnosis present

## 2014-02-20 DIAGNOSIS — M1612 Unilateral primary osteoarthritis, left hip: Secondary | ICD-10-CM

## 2014-02-20 DIAGNOSIS — Z85828 Personal history of other malignant neoplasm of skin: Secondary | ICD-10-CM

## 2014-02-20 DIAGNOSIS — Z6827 Body mass index (BMI) 27.0-27.9, adult: Secondary | ICD-10-CM

## 2014-02-20 DIAGNOSIS — Z471 Aftercare following joint replacement surgery: Secondary | ICD-10-CM | POA: Diagnosis not present

## 2014-02-20 DIAGNOSIS — Z4789 Encounter for other orthopedic aftercare: Secondary | ICD-10-CM | POA: Diagnosis not present

## 2014-02-20 DIAGNOSIS — M129 Arthropathy, unspecified: Secondary | ICD-10-CM | POA: Diagnosis not present

## 2014-02-20 DIAGNOSIS — M259 Joint disorder, unspecified: Secondary | ICD-10-CM | POA: Diagnosis not present

## 2014-02-20 DIAGNOSIS — Z96642 Presence of left artificial hip joint: Secondary | ICD-10-CM

## 2014-02-20 DIAGNOSIS — S79919A Unspecified injury of unspecified hip, initial encounter: Secondary | ICD-10-CM | POA: Diagnosis not present

## 2014-02-20 DIAGNOSIS — K8020 Calculus of gallbladder without cholecystitis without obstruction: Secondary | ICD-10-CM | POA: Diagnosis not present

## 2014-02-20 DIAGNOSIS — M810 Age-related osteoporosis without current pathological fracture: Secondary | ICD-10-CM | POA: Diagnosis present

## 2014-02-20 HISTORY — PX: TOTAL HIP ARTHROPLASTY: SHX124

## 2014-02-20 LAB — GLUCOSE, CAPILLARY
GLUCOSE-CAPILLARY: 163 mg/dL — AB (ref 70–99)
GLUCOSE-CAPILLARY: 320 mg/dL — AB (ref 70–99)
Glucose-Capillary: 149 mg/dL — ABNORMAL HIGH (ref 70–99)
Glucose-Capillary: 275 mg/dL — ABNORMAL HIGH (ref 70–99)

## 2014-02-20 LAB — ABO/RH: ABO/RH(D): A NEG

## 2014-02-20 LAB — TYPE AND SCREEN
ABO/RH(D): A NEG
Antibody Screen: NEGATIVE

## 2014-02-20 SURGERY — ARTHROPLASTY, HIP, TOTAL, ANTERIOR APPROACH
Anesthesia: General | Site: Hip | Laterality: Left

## 2014-02-20 MED ORDER — HYDROMORPHONE HCL PF 1 MG/ML IJ SOLN
0.2500 mg | INTRAMUSCULAR | Status: DC | PRN
Start: 2014-02-20 — End: 2014-02-20
  Administered 2014-02-20: 0.25 mg via INTRAVENOUS
  Administered 2014-02-20: 0.5 mg via INTRAVENOUS
  Administered 2014-02-20: 0.25 mg via INTRAVENOUS

## 2014-02-20 MED ORDER — PHENOL 1.4 % MT LIQD: 1.0000 | OROMUCOSAL | Status: DC | PRN

## 2014-02-20 MED ORDER — HYDROMORPHONE HCL PF 1 MG/ML IJ SOLN
INTRAMUSCULAR | Status: AC
  Filled 2014-02-20: qty 1

## 2014-02-20 MED ORDER — BUPIVACAINE HCL (PF) 0.25 % IJ SOLN
INTRAMUSCULAR | Status: AC
  Filled 2014-02-20: qty 30

## 2014-02-20 MED ORDER — PROPOFOL 10 MG/ML IV BOLUS
INTRAVENOUS | Status: DC | PRN
  Administered 2014-02-20: 140 mg via INTRAVENOUS

## 2014-02-20 MED ORDER — DOCUSATE SODIUM 100 MG PO CAPS
100.0000 mg | ORAL_CAPSULE | Freq: Two times a day (BID) | ORAL | Status: DC
  Administered 2014-02-20 – 2014-02-25 (×10): 100 mg via ORAL

## 2014-02-20 MED ORDER — BISACODYL 10 MG RE SUPP: 10.0000 mg | Freq: Every day | RECTAL | Status: DC | PRN

## 2014-02-20 MED ORDER — DIPHENHYDRAMINE HCL 12.5 MG/5ML PO ELIX
12.5000 mg | ORAL_SOLUTION | ORAL | Status: DC | PRN
  Administered 2014-02-21: 25 mg via ORAL
  Administered 2014-02-22: 12.5 mg via ORAL
  Filled 2014-02-20: qty 10
  Filled 2014-02-20: qty 5

## 2014-02-20 MED ORDER — SUCCINYLCHOLINE CHLORIDE 20 MG/ML IJ SOLN
INTRAMUSCULAR | Status: DC | PRN
  Administered 2014-02-20: 100 mg via INTRAVENOUS

## 2014-02-20 MED ORDER — SODIUM CHLORIDE 0.9 % IV SOLN
INTRAVENOUS | Status: DC
  Administered 2014-02-20: 22:00:00 via INTRAVENOUS

## 2014-02-20 MED ORDER — FENTANYL CITRATE 0.05 MG/ML IJ SOLN
INTRAMUSCULAR | Status: DC | PRN
  Administered 2014-02-20 (×4): 50 ug via INTRAVENOUS

## 2014-02-20 MED ORDER — ZOLPIDEM TARTRATE 10 MG PO TABS: 10.0000 mg | ORAL_TABLET | Freq: Every evening | ORAL | Status: DC | PRN

## 2014-02-20 MED ORDER — INSULIN ASPART 100 UNIT/ML ~~LOC~~ SOLN
0.0000 [IU] | Freq: Every day | SUBCUTANEOUS | Status: DC
Start: 2014-02-20 — End: 2014-02-25
  Administered 2014-02-20 – 2014-02-21 (×2): 3 [IU] via SUBCUTANEOUS
  Administered 2014-02-23 – 2014-02-24 (×2): 2 [IU] via SUBCUTANEOUS

## 2014-02-20 MED ORDER — GLYCOPYRROLATE 0.2 MG/ML IJ SOLN
INTRAMUSCULAR | Status: DC | PRN
  Administered 2014-02-20: 0.4 mg via INTRAVENOUS

## 2014-02-20 MED ORDER — ACETAMINOPHEN 325 MG PO TABS
650.0000 mg | ORAL_TABLET | Freq: Four times a day (QID) | ORAL | Status: DC | PRN
  Administered 2014-02-23 – 2014-02-25 (×3): 650 mg via ORAL
  Filled 2014-02-20 (×3): qty 2

## 2014-02-20 MED ORDER — KETOROLAC TROMETHAMINE 15 MG/ML IJ SOLN
7.5000 mg | Freq: Four times a day (QID) | INTRAMUSCULAR | Status: AC | PRN
  Administered 2014-02-20: 7.5 mg via INTRAVENOUS
  Filled 2014-02-20: qty 1

## 2014-02-20 MED ORDER — HYDRALAZINE HCL 20 MG/ML IJ SOLN
INTRAMUSCULAR | Status: AC
Start: 2014-02-20 — End: 2014-02-21
  Filled 2014-02-20: qty 1

## 2014-02-20 MED ORDER — FENTANYL CITRATE 0.05 MG/ML IJ SOLN
INTRAMUSCULAR | Status: AC
  Filled 2014-02-20: qty 2

## 2014-02-20 MED ORDER — POLYETHYL GLYCOL-PROPYL GLYCOL 0.4-0.3 % OP SOLN: 1.0000 [drp] | OPHTHALMIC | Status: DC | PRN

## 2014-02-20 MED ORDER — GLYCOPYRROLATE 0.2 MG/ML IJ SOLN
INTRAMUSCULAR | Status: AC
  Filled 2014-02-20: qty 2

## 2014-02-20 MED ORDER — HYDROMORPHONE HCL PF 1 MG/ML IJ SOLN
0.5000 mg | INTRAMUSCULAR | Status: DC | PRN
  Administered 2014-02-20: 0.5 mg via INTRAVENOUS
  Filled 2014-02-20: qty 1

## 2014-02-20 MED ORDER — NEOSTIGMINE METHYLSULFATE 10 MG/10ML IV SOLN
INTRAVENOUS | Status: DC | PRN
  Administered 2014-02-20: 3 mg via INTRAVENOUS

## 2014-02-20 MED ORDER — POLYVINYL ALCOHOL 1.4 % OP SOLN
1.0000 [drp] | OPHTHALMIC | Status: DC | PRN
  Filled 2014-02-20: qty 15

## 2014-02-20 MED ORDER — ROCURONIUM BROMIDE 100 MG/10ML IV SOLN
INTRAVENOUS | Status: DC | PRN
  Administered 2014-02-20: 30 mg via INTRAVENOUS

## 2014-02-20 MED ORDER — DEXAMETHASONE SODIUM PHOSPHATE 10 MG/ML IJ SOLN: 10.0000 mg | Freq: Once | INTRAMUSCULAR | Status: DC

## 2014-02-20 MED ORDER — ONDANSETRON HCL 4 MG PO TABS: 4.0000 mg | ORAL_TABLET | Freq: Four times a day (QID) | ORAL | Status: DC | PRN

## 2014-02-20 MED ORDER — DEXAMETHASONE 6 MG PO TABS
10.0000 mg | ORAL_TABLET | Freq: Every day | ORAL | Status: AC
  Administered 2014-02-21: 10 mg via ORAL
  Filled 2014-02-20: qty 1

## 2014-02-20 MED ORDER — ZOLPIDEM TARTRATE 5 MG PO TABS: 5.0000 mg | ORAL_TABLET | Freq: Every evening | ORAL | Status: DC | PRN

## 2014-02-20 MED ORDER — ACETAMINOPHEN 10 MG/ML IV SOLN
1000.0000 mg | Freq: Once | INTRAVENOUS | Status: AC
  Administered 2014-02-20: 1000 mg via INTRAVENOUS
  Filled 2014-02-20: qty 100

## 2014-02-20 MED ORDER — TIMOLOL HEMIHYDRATE 0.5 % OP SOLN: 1.0000 [drp] | Freq: Every day | OPHTHALMIC | Status: DC

## 2014-02-20 MED ORDER — INSULIN ASPART 100 UNIT/ML ~~LOC~~ SOLN
0.0000 [IU] | Freq: Three times a day (TID) | SUBCUTANEOUS | Status: DC
  Administered 2014-02-21: 3 [IU] via SUBCUTANEOUS
  Administered 2014-02-21 (×2): 2 [IU] via SUBCUTANEOUS
  Administered 2014-02-22 (×3): 1 [IU] via SUBCUTANEOUS
  Administered 2014-02-23: 2 [IU] via SUBCUTANEOUS
  Administered 2014-02-23: 5 [IU] via SUBCUTANEOUS
  Administered 2014-02-23 – 2014-02-25 (×4): 2 [IU] via SUBCUTANEOUS

## 2014-02-20 MED ORDER — METHOCARBAMOL 500 MG PO TABS
500.0000 mg | ORAL_TABLET | Freq: Four times a day (QID) | ORAL | Status: DC | PRN
  Administered 2014-02-21 (×2): 500 mg via ORAL
  Filled 2014-02-20 (×3): qty 1

## 2014-02-20 MED ORDER — VANCOMYCIN HCL IN DEXTROSE 1-5 GM/200ML-% IV SOLN
1000.0000 mg | INTRAVENOUS | Status: AC
  Administered 2014-02-20: 1000 mg via INTRAVENOUS

## 2014-02-20 MED ORDER — DEXAMETHASONE SODIUM PHOSPHATE 10 MG/ML IJ SOLN
10.0000 mg | Freq: Every day | INTRAMUSCULAR | Status: AC
  Filled 2014-02-20: qty 1

## 2014-02-20 MED ORDER — VANCOMYCIN HCL IN DEXTROSE 1-5 GM/200ML-% IV SOLN
1000.0000 mg | Freq: Two times a day (BID) | INTRAVENOUS | Status: AC
  Administered 2014-02-21: 1000 mg via INTRAVENOUS
  Filled 2014-02-20: qty 200

## 2014-02-20 MED ORDER — ACETAMINOPHEN 650 MG RE SUPP: 650.0000 mg | Freq: Four times a day (QID) | RECTAL | Status: DC | PRN

## 2014-02-20 MED ORDER — LACTATED RINGERS IV SOLN
INTRAVENOUS | Status: DC
  Administered 2014-02-20: 1000 mL via INTRAVENOUS
  Administered 2014-02-20: 17:00:00 via INTRAVENOUS

## 2014-02-20 MED ORDER — SODIUM CHLORIDE 0.9 % IJ SOLN
INTRAMUSCULAR | Status: DC | PRN
Start: 2014-02-20 — End: 2014-02-20
  Administered 2014-02-20: 30 mL

## 2014-02-20 MED ORDER — METOCLOPRAMIDE HCL 5 MG/ML IJ SOLN: 5.0000 mg | Freq: Three times a day (TID) | INTRAMUSCULAR | Status: DC | PRN

## 2014-02-20 MED ORDER — ONDANSETRON HCL 4 MG/2ML IJ SOLN
INTRAMUSCULAR | Status: DC | PRN
  Administered 2014-02-20: 4 mg via INTRAVENOUS

## 2014-02-20 MED ORDER — METFORMIN HCL 500 MG PO TABS
500.0000 mg | ORAL_TABLET | Freq: Every day | ORAL | Status: DC
  Administered 2014-02-21: 500 mg via ORAL
  Filled 2014-02-20 (×2): qty 1

## 2014-02-20 MED ORDER — TRAMADOL HCL 50 MG PO TABS
50.0000 mg | ORAL_TABLET | Freq: Four times a day (QID) | ORAL | Status: DC | PRN
  Administered 2014-02-21: 100 mg via ORAL
  Administered 2014-02-22: 50 mg via ORAL
  Administered 2014-02-22 – 2014-02-25 (×3): 100 mg via ORAL
  Filled 2014-02-20 (×2): qty 2
  Filled 2014-02-20: qty 1
  Filled 2014-02-20 (×2): qty 2
  Filled 2014-02-20: qty 1
  Filled 2014-02-20: qty 2

## 2014-02-20 MED ORDER — BUPIVACAINE HCL (PF) 0.25 % IJ SOLN
INTRAMUSCULAR | Status: DC | PRN
  Administered 2014-02-20: 25 mL

## 2014-02-20 MED ORDER — ROCURONIUM BROMIDE 100 MG/10ML IV SOLN
INTRAVENOUS | Status: AC
  Filled 2014-02-20: qty 1

## 2014-02-20 MED ORDER — BUPIVACAINE LIPOSOME 1.3 % IJ SUSP
20.0000 mL | Freq: Once | INTRAMUSCULAR | Status: DC
  Filled 2014-02-20: qty 20

## 2014-02-20 MED ORDER — ALPRAZOLAM 0.25 MG PO TABS
0.2500 mg | ORAL_TABLET | Freq: Every evening | ORAL | Status: DC | PRN
  Administered 2014-02-20 – 2014-02-24 (×3): 0.25 mg via ORAL
  Filled 2014-02-20 (×3): qty 1

## 2014-02-20 MED ORDER — 0.9 % SODIUM CHLORIDE (POUR BTL) OPTIME
TOPICAL | Status: DC | PRN
  Administered 2014-02-20: 1000 mL

## 2014-02-20 MED ORDER — ACETAMINOPHEN 500 MG PO TABS
1000.0000 mg | ORAL_TABLET | Freq: Four times a day (QID) | ORAL | Status: AC
  Administered 2014-02-20 – 2014-02-21 (×4): 1000 mg via ORAL
  Filled 2014-02-20 (×4): qty 2

## 2014-02-20 MED ORDER — FLEET ENEMA 7-19 GM/118ML RE ENEM: 1.0000 | ENEMA | Freq: Once | RECTAL | Status: AC | PRN

## 2014-02-20 MED ORDER — HYDRALAZINE HCL 20 MG/ML IJ SOLN
5.0000 mg | INTRAMUSCULAR | Status: DC | PRN
  Administered 2014-02-20: 5 mg via INTRAVENOUS

## 2014-02-20 MED ORDER — SODIUM CHLORIDE 0.9 % IJ SOLN
INTRAMUSCULAR | Status: AC
  Filled 2014-02-20: qty 50

## 2014-02-20 MED ORDER — RIVAROXABAN 10 MG PO TABS
10.0000 mg | ORAL_TABLET | Freq: Every day | ORAL | Status: DC
  Administered 2014-02-21 – 2014-02-25 (×5): 10 mg via ORAL
  Filled 2014-02-20 (×6): qty 1

## 2014-02-20 MED ORDER — VANCOMYCIN HCL IN DEXTROSE 1-5 GM/200ML-% IV SOLN
INTRAVENOUS | Status: AC
  Filled 2014-02-20: qty 200

## 2014-02-20 MED ORDER — LOSARTAN POTASSIUM 50 MG PO TABS
50.0000 mg | ORAL_TABLET | Freq: Every day | ORAL | Status: DC
  Administered 2014-02-20 – 2014-02-24 (×5): 50 mg via ORAL
  Filled 2014-02-20 (×6): qty 1

## 2014-02-20 MED ORDER — METOCLOPRAMIDE HCL 10 MG PO TABS: 5.0000 mg | ORAL_TABLET | Freq: Three times a day (TID) | ORAL | Status: DC | PRN

## 2014-02-20 MED ORDER — DEXAMETHASONE SODIUM PHOSPHATE 10 MG/ML IJ SOLN
INTRAMUSCULAR | Status: DC | PRN
  Administered 2014-02-20: 10 mg via INTRAVENOUS

## 2014-02-20 MED ORDER — ONDANSETRON HCL 4 MG/2ML IJ SOLN: 4.0000 mg | Freq: Four times a day (QID) | INTRAMUSCULAR | Status: DC | PRN

## 2014-02-20 MED ORDER — METHOCARBAMOL 1000 MG/10ML IJ SOLN
500.0000 mg | Freq: Four times a day (QID) | INTRAVENOUS | Status: DC | PRN
  Administered 2014-02-20: 500 mg via INTRAVENOUS
  Filled 2014-02-20 (×2): qty 5

## 2014-02-20 MED ORDER — MENTHOL 3 MG MT LOZG: 1.0000 | LOZENGE | OROMUCOSAL | Status: DC | PRN

## 2014-02-20 MED ORDER — CHLORHEXIDINE GLUCONATE 4 % EX LIQD: 60.0000 mL | Freq: Once | CUTANEOUS | Status: DC

## 2014-02-20 MED ORDER — GABAPENTIN 300 MG PO CAPS
300.0000 mg | ORAL_CAPSULE | Freq: Four times a day (QID) | ORAL | Status: DC
  Administered 2014-02-20 – 2014-02-25 (×17): 300 mg via ORAL
  Filled 2014-02-20 (×21): qty 1

## 2014-02-20 MED ORDER — SODIUM CHLORIDE 0.9 % IV SOLN: INTRAVENOUS | Status: DC

## 2014-02-20 MED ORDER — POLYETHYLENE GLYCOL 3350 17 G PO PACK
17.0000 g | PACK | Freq: Every day | ORAL | Status: DC | PRN
  Administered 2014-02-23 – 2014-02-24 (×2): 17 g via ORAL

## 2014-02-20 MED ORDER — TRANEXAMIC ACID 100 MG/ML IV SOLN
1000.0000 mg | INTRAVENOUS | Status: AC
  Administered 2014-02-20: 1000 mg via INTRAVENOUS
  Filled 2014-02-20: qty 10

## 2014-02-20 MED ORDER — LIDOCAINE HCL (CARDIAC) 20 MG/ML IV SOLN
INTRAVENOUS | Status: DC | PRN
  Administered 2014-02-20: 100 mg via INTRAVENOUS

## 2014-02-20 MED ORDER — ONDANSETRON HCL 4 MG/2ML IJ SOLN
INTRAMUSCULAR | Status: AC
  Filled 2014-02-20: qty 2

## 2014-02-20 MED ORDER — TIMOLOL MALEATE 0.5 % OP SOLN
1.0000 [drp] | Freq: Every day | OPHTHALMIC | Status: DC
  Administered 2014-02-21 – 2014-02-25 (×5): 1 [drp] via OPHTHALMIC
  Filled 2014-02-20: qty 5

## 2014-02-20 MED ORDER — EZETIMIBE-SIMVASTATIN 10-40 MG PO TABS
0.5000 | ORAL_TABLET | Freq: Every day | ORAL | Status: DC
  Administered 2014-02-20 – 2014-02-22 (×3): 0.5 via ORAL
  Administered 2014-02-23: 20:00:00 via ORAL
  Administered 2014-02-24: 0.5 via ORAL
  Filled 2014-02-20 (×6): qty 0.5

## 2014-02-20 MED ORDER — PROMETHAZINE HCL 25 MG/ML IJ SOLN: 6.2500 mg | INTRAMUSCULAR | Status: DC | PRN

## 2014-02-20 MED ORDER — HYDROMORPHONE HCL 2 MG PO TABS
2.0000 mg | ORAL_TABLET | ORAL | Status: DC | PRN
  Administered 2014-02-20 – 2014-02-25 (×14): 2 mg via ORAL
  Filled 2014-02-20 (×14): qty 1

## 2014-02-20 MED ORDER — DEXAMETHASONE SODIUM PHOSPHATE 10 MG/ML IJ SOLN
INTRAMUSCULAR | Status: AC
  Filled 2014-02-20: qty 1

## 2014-02-20 MED ORDER — PROPOFOL 10 MG/ML IV BOLUS
INTRAVENOUS | Status: AC
  Filled 2014-02-20: qty 20

## 2014-02-20 MED ORDER — BUPIVACAINE LIPOSOME 1.3 % IJ SUSP
INTRAMUSCULAR | Status: DC | PRN
  Administered 2014-02-20: 20 mL

## 2014-02-20 SURGICAL SUPPLY — 41 items
BAG SPEC THK2 15X12 ZIP CLS (MISCELLANEOUS)
BAG ZIPLOCK 12X15 (MISCELLANEOUS) IMPLANT
BLADE EXTENDED COATED 6.5IN (ELECTRODE) ×3 IMPLANT
BLADE SAG 18X100X1.27 (BLADE) ×3 IMPLANT
CAPT HIP PF MOP ×2 IMPLANT
CLOSURE WOUND 1/2 X4 (GAUZE/BANDAGES/DRESSINGS) ×1
COVER PERINEAL POST (MISCELLANEOUS) ×3 IMPLANT
DECANTER SPIKE VIAL GLASS SM (MISCELLANEOUS) ×3 IMPLANT
DRAPE C-ARM 42X120 X-RAY (DRAPES) ×3 IMPLANT
DRAPE STERI IOBAN 125X83 (DRAPES) ×3 IMPLANT
DRAPE U-SHAPE 47X51 STRL (DRAPES) ×9 IMPLANT
DRSG ADAPTIC 3X8 NADH LF (GAUZE/BANDAGES/DRESSINGS) ×3 IMPLANT
DRSG MEPILEX BORDER 4X4 (GAUZE/BANDAGES/DRESSINGS) ×3 IMPLANT
DRSG MEPILEX BORDER 4X8 (GAUZE/BANDAGES/DRESSINGS) ×3 IMPLANT
DURAPREP 26ML APPLICATOR (WOUND CARE) ×3 IMPLANT
ELECT REM PT RETURN 9FT ADLT (ELECTROSURGICAL) ×3
ELECTRODE REM PT RTRN 9FT ADLT (ELECTROSURGICAL) ×1 IMPLANT
EVACUATOR 1/8 PVC DRAIN (DRAIN) ×3 IMPLANT
FACESHIELD WRAPAROUND (MASK) ×12 IMPLANT
FACESHIELD WRAPAROUND OR TEAM (MASK) ×4 IMPLANT
GAUZE SPONGE 4X4 12PLY STRL (GAUZE/BANDAGES/DRESSINGS) IMPLANT
GLOVE BIO SURGEON STRL SZ7.5 (GLOVE) ×3 IMPLANT
GLOVE BIO SURGEON STRL SZ8 (GLOVE) ×6 IMPLANT
GLOVE BIOGEL PI IND STRL 8 (GLOVE) ×2 IMPLANT
GLOVE BIOGEL PI INDICATOR 8 (GLOVE) ×4
GOWN STRL REUS W/TWL LRG LVL3 (GOWN DISPOSABLE) ×3 IMPLANT
GOWN STRL REUS W/TWL XL LVL3 (GOWN DISPOSABLE) ×3 IMPLANT
KIT BASIN OR (CUSTOM PROCEDURE TRAY) ×3 IMPLANT
NDL SAFETY ECLIPSE 18X1.5 (NEEDLE) ×2 IMPLANT
NEEDLE HYPO 18GX1.5 SHARP (NEEDLE) ×6
PACK TOTAL JOINT (CUSTOM PROCEDURE TRAY) ×3 IMPLANT
STRIP CLOSURE SKIN 1/2X4 (GAUZE/BANDAGES/DRESSINGS) ×2 IMPLANT
SUT ETHIBOND NAB CT1 #1 30IN (SUTURE) ×3 IMPLANT
SUT MNCRL AB 4-0 PS2 18 (SUTURE) ×3 IMPLANT
SUT VIC AB 2-0 CT1 27 (SUTURE) ×6
SUT VIC AB 2-0 CT1 TAPERPNT 27 (SUTURE) ×2 IMPLANT
SUT VLOC 180 0 24IN GS25 (SUTURE) ×3 IMPLANT
SYRINGE 20CC LL (MISCELLANEOUS) ×3 IMPLANT
SYRINGE 60CC LL (MISCELLANEOUS) ×3 IMPLANT
TOWEL OR 17X26 10 PK STRL BLUE (TOWEL DISPOSABLE) ×3 IMPLANT
TRAY FOLEY CATH 14FRSI W/METER (CATHETERS) ×3 IMPLANT

## 2014-02-20 NOTE — Anesthesia Postprocedure Evaluation (Signed)
  Anesthesia Post-op Note  Patient: Marissa Ryan  Procedure(s) Performed: Procedure(s) (LRB): LEFT TOTAL HIP ARTHROPLASTY ANTERIOR APPROACH (Left)  Patient Location: PACU  Anesthesia Type: General  Level of Consciousness: awake and alert   Airway and Oxygen Therapy: Patient Spontanous Breathing  Post-op Pain: mild  Post-op Assessment: Post-op Vital signs reviewed, Patient's Cardiovascular Status Stable, Respiratory Function Stable, Patent Airway and No signs of Nausea or vomiting  Last Vitals:  Filed Vitals:   02/20/14 1840  BP: 190/53  Pulse: 62  Temp:   Resp: 17    Post-op Vital Signs: stable   Complications: No apparent anesthesia complications

## 2014-02-20 NOTE — Transfer of Care (Signed)
Immediate Anesthesia Transfer of Care Note  Patient: Marissa Ryan  Procedure(s) Performed: Procedure(s): LEFT TOTAL HIP ARTHROPLASTY ANTERIOR APPROACH (Left)  Patient Location: PACU  Anesthesia Type:General  Level of Consciousness: sedated  Airway & Oxygen Therapy: Patient Spontanous Breathing and Patient connected to face mask oxygen  Post-op Assessment: Report given to PACU RN and Post -op Vital signs reviewed and stable  Post vital signs: Reviewed and stable  Complications: No apparent anesthesia complications

## 2014-02-20 NOTE — Interval H&P Note (Signed)
History and Physical Interval Note:  02/20/2014 3:21 PM  Marissa Ryan  has presented today for surgery, with the diagnosis of left hip osteoarthritis  The various methods of treatment have been discussed with the patient and family. After consideration of risks, benefits and other options for treatment, the patient has consented to  Procedure(s): LEFT TOTAL HIP ARTHROPLASTY ANTERIOR APPROACH (Left) as a surgical intervention .  The patient's history has been reviewed, patient examined, no change in status, stable for surgery.  I have reviewed the patient's chart and labs.  Questions were answered to the patient's satisfaction.     Gearlean Alf

## 2014-02-20 NOTE — Anesthesia Preprocedure Evaluation (Addendum)
Anesthesia Evaluation  Patient identified by MRN, date of birth, ID band Patient awake  General Assessment Comment:Had peanut butter at 1130  Reviewed: Allergy & Precautions, H&P , NPO status , Patient's Chart, lab work & pertinent test results  Airway Mallampati: II TM Distance: >3 FB Neck ROM: Full    Dental no notable dental hx.    Pulmonary neg pulmonary ROS,  breath sounds clear to auscultation  Pulmonary exam normal       Cardiovascular hypertension, negative cardio ROS  Rhythm:Regular Rate:Normal     Neuro/Psych negative neurological ROS  negative psych ROS   GI/Hepatic negative GI ROS, Neg liver ROS,   Endo/Other  diabetes, Oral Hypoglycemic Agents  Renal/GU negative Renal ROS  negative genitourinary   Musculoskeletal negative musculoskeletal ROS (+)   Abdominal   Peds negative pediatric ROS (+)  Hematology negative hematology ROS (+)   Anesthesia Other Findings   Reproductive/Obstetrics negative OB ROS                        Anesthesia Physical Anesthesia Plan  ASA: II  Anesthesia Plan: General   Post-op Pain Management:    Induction: Intravenous and Rapid sequence  Airway Management Planned: Simple Face Mask and Oral ETT  Additional Equipment:   Intra-op Plan:   Post-operative Plan: Extubation in OR  Informed Consent: I have reviewed the patients History and Physical, chart, labs and discussed the procedure including the risks, benefits and alternatives for the proposed anesthesia with the patient or authorized representative who has indicated his/her understanding and acceptance.   Dental advisory given  Plan Discussed with: CRNA and Surgeon  Anesthesia Plan Comments:        Anesthesia Quick Evaluation

## 2014-02-20 NOTE — Progress Notes (Signed)
[place] in [place] notified that patient had a total of 1 tsp peanut butter this am. Last time at 11:30am. She will speak to anesthesiologist

## 2014-02-20 NOTE — H&P (View-Only) (Signed)
Marissa Ryan DOB: May 01, 1925 Married / Language: English / Race: White Female Date of Admission:  02-20-2014 Chief Complaint:  Left Hip Pain History of Present Illness  The patient is a 80 year old female who comes in  for a preoperative History and Physical. The patient is scheduled for a left total hip arthroplasty (Anterior Approach) to be performed by Dr. Dione Plover. [name], MD at [facility] on 02/20/2014. The patient is a 80 year old female who presents with a left hip problem. The patient was seen in referral from Dr. [name].The patient reports left hip problems including pain symptoms that have been present for month(s). The symptoms began without any known injury. Symptoms reported include hip pain and difficulty ambulating The patient reports symptoms radiating to the: left groin and left thigh anteriorly. Previous treatment for this problem has included corticosteroid injection (injection on 09-05-13 helped for a few weeks). The patient reports left knee symptoms including: pain and swelling .The patient feels that the symptoms are worsening. The patient has the current diagnosis of knee osteoarthritis. Previous work-up for this problem has included knee x-rays. Past treatment for this problem has included intra-articular injection of corticosteroids (4 weeks ago by Dr. [name]). Current treatment includes opioid analgesics (Oxycodone; she was given a prescription for Oxycontin at her last visit with Dr. [name]. She reports she is unable to take that.). Ms. [name] comes in for evaluation of the left hip. It's been going on for quite some time now but has gotten worse over the past 2 months. She denies any injury, but it's reached a point where it's just about constant. She's been treated by Dr. [name] for pain for some time now. She's been on Oxycodone which does help while she's taking it. Due to the pain, she's been walking slowly. She has had some groin pain, also occasional buttock pain on  that side. Sometimes pain will go down into the thigh and into the knee with occasional calf pain. She has noticed that it has become more difficult to get in and out of the car. She only has a few steps at home, and she makes sure she holds onto the railing when going up. She leads with the other foot, though, trying to go one at a time, being more cautious with the left leg. She has also noticed it is a little bit more difficult to get shoes and socks on. She cannot cross that leg over. She denies any numbness or tingling going down the leg. It's mainly just pain. There is no popping or buckling. The pain does radiate down into the knee. It sounds like most of this issue is ongoing about the hip. She is ready to proceed with hip surgery at this time. They have been treated conservatively in the past for the above stated problem and despite conservative measures, they continue to have progressive pain and severe functional limitations and dysfunction. They have failed non-operative management including home exercise, medications, and injections. It is felt that they would benefit from undergoing total joint replacement. Risks and benefits of the procedure have been discussed with the patient and they elect to proceed with surgery. There are no active contraindications to surgery such as ongoing infection or rapidly progressive neurological disease.  Allergies  Penicillin G Sodium *PENICILLINS* Itching. Morphine Sulfate (Concentrate) *ANALGESICS - OPIOID* Itching.  Intolerance NSAIDs Diarrhea.  Problem List/Past Medical  Osteoarthritis of left hip (715.95  M16.12) Cervical disc degeneration (722.4) Lumbar disc degeneration (722.52  M51.36) Encounter for long-term use of opiate analgesic (V58.69  Z79.891) Impingement syndrome of right shoulder (726.2  M75.41) Postlaminectomy syndrome of lumbar region (722.83  M96.1) Chronic pain disorder (338.4  G89.4) High blood  pressure Hypercholesterolemia Osteoarthritis Osteoporosis Osteoarthritis of left knee (715.96  M17.9)05/12/2001 Impaired Vision Impaired Hearing Glaucoma Non-Insulin Dependent Diabetes Mellitus Degenerative Disc Disease  Family History  First Degree Relatives Heart Disease brother Cancer First Degree Relatives. sister Mother Myocardial infarction.  Social History Tobacco use Never smoker. never smoker Drug/Alcohol Rehab (Currently) no Living situation live with spouse Current work status retired No alcohol use Drug/Alcohol Rehab (Previously) no Exercise Exercises never; does running / walking Alcohol use current drinker; drinks wine; only occasionally per week Children 0 Illicit drug use yes Marital status married Number of flights of stairs before winded less than 1 Pain Contract yes Tobacco / smoke exposure no Post-Surgical Plans Home Advance Directives Living Will, Healthcare POA  Medication History  Gabapentin (300MG  Capsule, 1 Oral five times per day, Taken starting 01/11/2014) Active. Percocet (10-325MG  Tablet, 1 Oral five times per day prn pain, Taken starting 12/20/2013) Active. MetFORMIN HCl (500MG  Tablet, Oral) Active. Simbrinza (1-0.2% Suspension, Ophthalmic) Active. ALPRAZolam (0.25MG  Tablet, Oral) Active. Fish Oil Active. ALPRAZolam (1MG  Tablet, Oral) Active. Benazepril-Hydrochlorothiazide (Oral) Specific dose unknown - Active. Travatan (Ophthalmic) Specific dose unknown - Active. Aspirin (81MG  Tablet, Oral) Active. Vytorin (10-80MG  Tablet, Oral) Active. Vitamins & Minerals (Oral) Active. Zolpidem Tartrate (10MG  Tablet, Oral) Active.  Past Surgical History  Gallbladder Surgery Date: 06/1998. laporoscopic Cataract Surgery Date: 09/1998. bilateral Hip Fracture and Surgery Date: 1978. left Rotator Cuff Repair Date: 05/2010. left Spinal Surgery Date: 06/1997. Appendectomy Foot Surgery Date:  12/2001. Left Wrist Surgery Date: 04/2003. Right Hand Surgery for Cancer Date: 04/2012.   Review of Systems General Not Present- Chills, Fatigue, Fever, Memory Loss, Night Sweats, Weight Gain and Weight Loss. Skin Not Present- Eczema, Hives, Itching, Lesions and Rash. HEENT Present- Hearing Loss. Not Present- Dentures, Double Vision, Headache, Tinnitus and Visual Loss. Respiratory Not Present- Allergies, Chronic Cough, Coughing up blood, Shortness of breath at rest and Shortness of breath with exertion. Cardiovascular Not Present- Chest Pain, Difficulty Breathing Lying Down, Murmur, Palpitations, Racing/skipping heartbeats and Swelling. Gastrointestinal Not Present- Abdominal Pain, Bloody Stool, Constipation, Diarrhea, Difficulty Swallowing, Heartburn, Jaundice, Loss of appetitie, Nausea and Vomiting. Female Genitourinary Not Present- Blood in Urine, Discharge, Flank Pain, Incontinence, Painful Urination, Urgency, Urinary frequency, Urinary Retention, Urinating at Night and Weak urinary stream. Musculoskeletal Present- Back Pain. Not Present- Joint Pain, Joint Swelling, Morning Stiffness, Muscle Pain, Muscle Weakness and Spasms. Neurological Not Present- Blackout spells, Difficulty with balance, Dizziness, Paralysis, Tremor and Weakness. Psychiatric Not Present- Insomnia.   Vitals Weight: 145 lb Height: 62in Weight was reported by patient. Height was reported by patient. Body Surface Area: 1.7 m Body Mass Index: 26.52 kg/m Pulse: 56 (Regular)  Resp.: 14 (Unlabored)  BP: 139/60 (Sitting, Right Arm, Standard)    Physical Exam General Mental Status -Alert, cooperative and good historian. General Appearance-pleasant, Not in acute distress. Orientation-Oriented X3. Build & Nutrition-Well nourished and Well developed.  Head and Neck Head-normocephalic, atraumatic . Neck Global Assessment - supple, no bruit auscultated on the right, no bruit auscultated on the  left. Note: bilateral hearing aids   Eye Vision-Wears corrective lenses. Pupil - Bilateral-Regular and Round. Motion - Bilateral-EOMI.  Chest and Lung Exam Auscultation Breath sounds - clear at anterior chest wall and clear at posterior chest wall. Adventitious sounds - No Adventitious sounds.  Cardiovascular Auscultation Rhythm -  Regular rate and rhythm. Heart Sounds - S1 WNL and S2 WNL. Murmurs & Other Heart Sounds - Auscultation of the heart reveals - No Murmurs.  Abdomen Palpation/Percussion Tenderness - Abdomen is non-tender to palpation. Rigidity (guarding) - Abdomen is soft. Auscultation Auscultation of the abdomen reveals - Bowel sounds normal.  Female Genitourinary Note: Not done, not pertinent to present illness   Musculoskeletal Note: Well developed female, alert and oriented in no apparent distress. Her left hip can be flexed to 90. No internal rotation, about 10-15 of external rotation, 20 of abduction. Right hip has normal motion without discomfort.  RADIOGRAPHS: Radiographs, AP pelvis, lateral of the hip. She has bone on bone arthritis throughout the hip joint with some subchondral cystic changes.  Assessment & Plan Osteoarthritis of left hip (715.95  M16.12) Note:Plan is for a Left Total Hip Replacement - anterior approach by Dr. Wynelle Link.  Plan is to go home  PCP - Dr. Joylene Draft  The patient does not have any contraindications and will receive TXA (tranexamic acid) prior to surgery.  Signed electronically by Ok Edwards, III PA-C

## 2014-02-20 NOTE — Op Note (Signed)
OPERATIVE REPORT  PREOPERATIVE DIAGNOSIS: Osteoarthritis of the Left hip.   POSTOPERATIVE DIAGNOSIS: Osteoarthritis of the Left  hip.   PROCEDURE: Left total hip arthroplasty, anterior approach.   SURGEON: Gaynelle Arabian, MD   ASSISTANT: Arlee Muslim, PA-C  ANESTHESIA:  Spinal  ESTIMATED BLOOD LOSS:- 200 ml   DRAINS: Hemovac x1.   COMPLICATIONS: None   CONDITION: PACU - hemodynamically stable.   BRIEF CLINICAL NOTE: Marissa Ryan is a 80 y.o. female who has advanced end-  stage arthritis of his left hip with progressively worsening pain and  dysfunction.The patient has failed nonoperative management and presents for  total hip arthroplasty.   PROCEDURE IN DETAIL: After successful administration of spinal  anesthetic, the traction boots for the [facility] bed were placed on both  feet and the patient was placed onto the [facility] bed, boots placed into the leg  holders. The Left hip was then isolated from the perineum with plastic  drapes and prepped and draped in the usual sterile fashion. ASIS and  greater trochanter were marked and a oblique incision was made, starting  at about 1 cm lateral and 2 cm distal to the ASIS and coursing towards  the anterior cortex of the femur. The skin was cut with a 10 blade  through subcutaneous tissue to the level of the fascia overlying the  tensor fascia lata muscle. The fascia was then incised in line with the  incision at the junction of the anterior third and posterior 2/3rd. The  muscle was teased off the fascia and then the interval between the TFL  and the rectus was developed. The Hohmann retractor was then placed at  the top of the femoral neck over the capsule. The vessels overlying the  capsule were cauterized and the fat on top of the capsule was removed.  A Hohmann retractor was then placed anterior underneath the rectus  femoris to give exposure to the entire anterior capsule. A T-shaped  capsulotomy was performed. The  edges were tagged and the femoral head  was identified.       Osteophytes are removed off the superior acetabulum.  The femoral neck was then cut in situ with an oscillating saw. Traction  was then applied to the left lower extremity utilizing the [facility]  traction. The femoral head was then removed. Retractors were placed  around the acetabulum and then circumferential removal of the labrum was  performed. Osteophytes were also removed. Reaming starts at 45 mm to  medialize and  Increased in 2 mm increments to 51 mm. We reamed in  approximately 40 degrees of abduction, 20 degrees anteversion. A 52 mm  pinnacle acetabular shell was then impacted in anatomic position under  fluoroscopic guidance with excellent purchase. We did not need to place  any additional dome screws. A 32 mm neutral + 4 marathon liner was then  placed into the acetabular shell.       The femoral lift was then placed along the lateral aspect of the femur  just distal to the vastus ridge. The leg was  externally rotated and capsule  was stripped off the inferior aspect of the femoral neck down to the  level of the lesser trochanter, this was done with electrocautery. The femur was lifted after this was performed. The  leg was then placed and extended in adducted position to essentially delivering the femur. We also removed the capsule superiorly and the  piriformis from the piriformis  fossa to gain excellent exposure of the  proximal femur. Rongeur was used to remove some cancellous bone to get  into the lateral portion of the proximal femur for placement of the  initial starter reamer. The starter broaches was placed  the starter broach  and was shown to go down the center of the canal. Broaching  with the  Corail system was then performed starting at size 8, coursing  Up to size 12. A size 12 had excellent torsional and rotational  and axial stability. The trial standard offset neck was then placed  with a 32 + 1 trial  head. The hip was then reduced. We confirmed that  the stem was in the canal both on AP and lateral x-rays. It also has excellent sizing. The hip was reduced with outstanding stability through full extension, full external rotation,  and then flexion in adduction internal rotation. AP pelvis was taken  and the leg lengths were measured and found to be exactly equal. Hip  was then dislocated again and the femoral head and neck removed. The  femoral broach was removed. Size 12 Corail stem with a standard offset  neck was then impacted into the femur following native anteversion. Has  excellent purchase in the canal. Excellent torsional and rotational and  axial stability. It is confirmed to be in the canal on AP and lateral  fluoroscopic views. The 32 + 1  metal head was placed and the hip  reduced with outstanding stability. Again AP pelvis was taken and it  confirmed that the leg lengths were equal. The wound was then copiously  irrigated with saline solution and the capsule reattached and repaired  with Ethibond suture.  20 mL of Exparel mixed with 50 mL of saline then additional 20 ml of .25% Bupivicaine injected into the capsule and into the edge of the tensor fascia lata as well as subcutaneous tissue. The fascia overlying the tensor fascia lata was  then closed with a running #1 V-Loc. Subcu was closed with interrupted  2-0 Vicryl and subcuticular running 4-0 Monocryl. Incision was cleaned  and dried. Steri-Strips and a bulky sterile dressing applied. Hemovac  drain was hooked to suction and then he was awakened and transported to  recovery in stable condition.        Please note that a surgical assistant was a medical necessity for this procedure to perform it in a safe and expeditious manner. Assistant was necessary to provide appropriate retraction of vital neurovascular structures and to prevent femoral fracture and allow for anatomic placement of the prosthesis.  Gaynelle Arabian, M.D.

## 2014-02-21 ENCOUNTER — Encounter (HOSPITAL_COMMUNITY): Payer: Self-pay | Admitting: Orthopedic Surgery

## 2014-02-21 LAB — CBC
HCT: 33.6 % — ABNORMAL LOW (ref 36.0–46.0)
Hemoglobin: 10.9 g/dL — ABNORMAL LOW (ref 12.0–15.0)
MCH: 31.9 pg (ref 26.0–34.0)
MCHC: 32.4 g/dL (ref 30.0–36.0)
MCV: 98.2 fL (ref 78.0–100.0)
PLATELETS: 268 10*3/uL (ref 150–400)
RBC: 3.42 MIL/uL — ABNORMAL LOW (ref 3.87–5.11)
RDW: 13.4 % (ref 11.5–15.5)
WBC: 12.9 10*3/uL — AB (ref 4.0–10.5)

## 2014-02-21 LAB — GLUCOSE, CAPILLARY
Glucose-Capillary: 168 mg/dL — ABNORMAL HIGH (ref 70–99)
Glucose-Capillary: 166 mg/dL — ABNORMAL HIGH (ref 70–99)
Glucose-Capillary: 216 mg/dL — ABNORMAL HIGH (ref 70–99)
Glucose-Capillary: 253 mg/dL — ABNORMAL HIGH (ref 70–99)

## 2014-02-21 LAB — BASIC METABOLIC PANEL
ANION GAP: 11 (ref 5–15)
BUN: 12 mg/dL (ref 6–23)
CO2: 22 mEq/L (ref 19–32)
Calcium: 8.6 mg/dL (ref 8.4–10.5)
Chloride: 105 mEq/L (ref 96–112)
Creatinine, Ser: 0.73 mg/dL (ref 0.50–1.10)
GFR calc Af Amer: 87 mL/min — ABNORMAL LOW (ref >90)
GFR calc non Af Amer: 75 mL/min — ABNORMAL LOW (ref >90)
Glucose, Bld: 198 mg/dL — ABNORMAL HIGH (ref 70–99)
POTASSIUM: 4.5 meq/L (ref 3.7–5.3)
Sodium: 138 mEq/L (ref 137–147)

## 2014-02-21 NOTE — Evaluation (Signed)
Physical Therapy Evaluation Patient Details Name: Marissa Ryan MRN: [id number] DOB: [birth year] Today's Date: 02/21/2014   History of Present Illness  L THR - ant dir  Clinical Impression  Pt s/p L THR presents with decreased L LE strength/ROM and post op pain limiting functional mobility.  Pt should progress to d./c home with spouse and follow up HHPT    Follow Up Recommendations Home health PT    Equipment Recommendations  None recommended by PT    Recommendations for Other Services OT consult     Precautions / Restrictions Precautions Precautions: Fall Restrictions Weight Bearing Restrictions: No Other Position/Activity Restrictions: WBAT      Mobility  Bed Mobility Overal bed mobility: Needs Assistance Bed Mobility: Supine to Sit     Supine to sit: Mod assist     General bed mobility comments: cues for sequence and use of R LE to self assist  Transfers Overall transfer level: Needs assistance Equipment used: Rolling walker (2 wheeled) Transfers: Sit to/from Stand Sit to Stand: Mod assist         General transfer comment: cues for LE management and use of UEs to self assist  Ambulation/Gait Ambulation/Gait assistance: Min assist;Mod assist Ambulation Distance (Feet): 39 Feet (and 4' bed to [facility]) Assistive device: Rolling walker (2 wheeled) Gait Pattern/deviations: Step-to pattern Gait velocity: decr   General Gait Details: cues for posture, sequence and position from [employer]            Wheelchair Mobility    Modified Rankin (Stroke Patients Only)       Balance                                             Pertinent Vitals/Pain 6/10; premed, ice pack provided    Home Living Family/patient expects to be discharged to:: Private residence Living Arrangements: Spouse/significant other Available Help at Discharge: Family Type of Home: House Home Access: Stairs to enter Entrance Stairs-Rails: Right Entrance  Stairs-Number of Steps: 2 Home Layout: One level Home Equipment: Environmental consultant - 2 wheels;Cane - single point Additional Comments: Family to bring in RW to adjust    Prior Function Level of Independence: Independent;Independent with assistive device(s)               Hand Dominance   Dominant Hand: Left    Extremity/Trunk Assessment   Upper Extremity Assessment: Overall WFL for tasks assessed           Lower Extremity Assessment: LLE deficits/detail   LLE Deficits / Details: 2+/5 hip strength with AAROM at hip to 90 flex and 15 abd     Communication   Communication: HOH  Cognition Arousal/Alertness: Awake/alert Behavior During Therapy: WFL for tasks assessed/performed Overall Cognitive Status: Within Functional Limits for tasks assessed                      General Comments      Exercises Total Joint Exercises Ankle Circles/Pumps: AROM;10 reps;Supine;Left Quad Sets: AROM;Left;10 reps;Supine Heel Slides: AAROM;15 reps;Left;Supine Hip ABduction/ADduction: AAROM;Left;10 reps;Supine      Assessment/Plan    PT Assessment Patient needs continued PT services  PT Diagnosis Difficulty walking   PT Problem List Decreased strength;Decreased range of motion;Decreased activity tolerance;Decreased mobility;Decreased knowledge of use of DME;Pain  PT Treatment Interventions DME instruction;Gait training;Stair training;Functional mobility training;Therapeutic activities;Therapeutic exercise;Patient/family education   PT  Goals (Current goals can be found in the Care Plan section) Acute Rehab PT Goals Patient Stated Goal: walk without pain PT Goal Formulation: With patient Time For Goal Achievement: 02/28/14 Potential to Achieve Goals: Good    Frequency 7X/week   Barriers to discharge        Co-evaluation               End of Session Equipment Utilized During Treatment: Gait belt Activity Tolerance: Patient tolerated treatment well Patient left: in  chair;with call bell/phone within reach;with family/visitor present Nurse Communication: Mobility status         Time: 1022-1122 PT Time Calculation (min): 60 min   Charges:   PT Evaluation $Initial PT Evaluation Tier I: 1 Procedure PT Treatments $Gait Training: 8-22 mins $Therapeutic Exercise: 8-22 mins $Therapeutic Activity: 8-22 mins   PT G Codes:          Macai Sisneros 02/21/2014, 12:34 PM

## 2014-02-21 NOTE — Progress Notes (Signed)
CARE MANAGEMENT NOTE 02/21/2014  Patient:  MIABELLA, SHANNAHAN   Account Number:  [id number]  Date Initiated:  02/21/2014  Documentation initiated by:  Zoie Sarin  Subjective/Objective Assessment:   pt with left total hip replacement     Action/Plan:   home with dme and hhc   Anticipated DC Date:  02/23/2014   Anticipated DC Plan:  [place]  In-house referral  NA      DC Planning Services  CM consult      PAC Choice  NA   Choice offered to / List presented to:  C-1 Patient   DME arranged  [place]      DME agency  [place] arranged  [place]   Status of service:  In process, will continue to follow Medicare Important Message given?  NA - LOS <3 / Initial given by admissions (If response is "NO", the following Medicare IM given date fields will be blank) Date Medicare IM given:   Medicare IM given by:   Date Additional Medicare IM given:   Additional Medicare IM given by:    Discharge Disposition:    Per UR Regulation:  Reviewed for med. necessity/level of care/duration of stay  If discussed at [place] of Stay Meetings, dates discussed:    Comments:  [id number]/Bronc Brosseau Eldridge Dace, [place], [place] [phone] Chart Reviewed for discharge and hospital needs. Discharge needs at time of review: None present will follow for needs. Review of patient progress due on [id number]

## 2014-02-21 NOTE — Evaluation (Signed)
Occupational Therapy Evaluation Patient Details Name: EVERLEY EVORA MRN: [id number] DOB: Oct 02, 1924 Today's Date: 02/21/2014    History of Present Illness L THR - ant dir   Clinical Impression   Pt up for toilet transfer with walker with min assist overall. Pt with 6/10 pain during toilet transfers. Will follow on acute to progress ADL independence and provide caregiver education for safety with ADL at d/c.    Follow Up Recommendations  Home health OT;Supervision/Assistance - 24 hour    Equipment Recommendations  None recommended by OT    Recommendations for Other Services       Precautions / Restrictions Precautions Precautions: Fall Restrictions Weight Bearing Restrictions: No Other Position/Activity Restrictions: WBAT      Mobility Bed Mobility             Transfers Overall transfer level: Needs assistance Equipment used: Rolling walker (2 wheeled) Transfers: Sit to/from Stand Sit to Stand: Min assist         General transfer comment: verbal cues for hand placement and LE management.    Balance                                            ADL Overall ADL's : Needs assistance/impaired Eating/Feeding: Independent;Sitting   Grooming: Wash/dry hands;Standing;Minimal assistance   Upper Body Bathing: Set up;Sitting   Lower Body Bathing: Moderate assistance;Sit to/from stand   Upper Body Dressing : Set up;Sitting   Lower Body Dressing: Maximal assistance;Sit to/from stand   Toilet Transfer: Minimal assistance;Ambulation;BSC;RW   Toileting- Clothing Manipulation and Hygiene: Minimal assistance;Sit to/from stand         General ADL Comments: Pt states husband can assist with LB ADL and she prefers to sponge bathe initially. Pt needs frequent cues for walker distance to self and hand placement, LE management with transitions and functional transfers.      Vision                     Perception     Praxis       Pertinent Vitals/Pain 6/10 L hip; reposition,      Hand Dominance Left   Extremity/Trunk Assessment Upper Extremity Assessment Upper Extremity Assessment: Overall WFL for tasks assessed          Communication Communication Communication: HOH   Cognition Arousal/Alertness: Awake/alert Behavior During Therapy: WFL for tasks assessed/performed Overall Cognitive Status: Within Functional Limits for tasks assessed                     General Comments       Exercises      Shoulder Instructions      Home Living Family/patient expects to be discharged to:: Private residence Living Arrangements: Spouse/significant other Available Help at Discharge: Family Type of Home: House Home Access: Stairs to enter Technical brewer of Steps: 2 Entrance Stairs-Rails: Right Home Layout: One level     Bathroom Shower/Tub: Occupational psychologist: Standard     Home Equipment: Environmental consultant - 2 wheels;Cane - single point;Bedside commode   Additional Comments: Family to bring in RW to adjust      Prior Functioning/Environment Level of Independence: Independent;Independent with assistive device(s)             OT Diagnosis: Generalized weakness   OT Problem List: Decreased strength;Decreased knowledge of use of DME or  AE   OT Treatment/Interventions: Self-care/ADL training;Patient/family education;Therapeutic activities;DME and/or AE instruction    OT Goals(Current goals can be found in the care plan section) Acute Rehab OT Goals Patient Stated Goal: decrease pain OT Goal Formulation: With patient Time For Goal Achievement: 02/28/14 Potential to Achieve Goals: Good  OT Frequency: Min 2X/week   Barriers to D/C:            Co-evaluation              End of Session Equipment Utilized During Treatment: Gait belt;Rolling walker  Activity Tolerance: Patient tolerated treatment well Patient left: in chair;with call bell/phone within reach;with  family/visitor present   Time: 1137-1205 OT Time Calculation (min): 28 min Charges:  OT General Charges $OT Visit: 1 Procedure OT Evaluation $Initial OT Evaluation Tier I: 1 Procedure OT Treatments $Therapeutic Activity: 8-22 mins G-Codes:    Jules Schick [phone] 02/21/2014, 1:00 PM

## 2014-02-21 NOTE — Progress Notes (Signed)
Physical Therapy Treatment Patient Details Name: Marissa Ryan MRN: [id number] DOB: [birth year] Today's Date: March 06, 2014    History of Present Illness L THR - ant dir    PT Comments    Progressing well with communication partially hampered by pt's hearing deficits  Follow Up Recommendations  Home health PT     Equipment Recommendations  None recommended by PT    Recommendations for Other Services OT consult     Precautions / Restrictions Precautions Precautions: Fall Restrictions Weight Bearing Restrictions: No Other Position/Activity Restrictions: WBAT    Mobility  Bed Mobility                  Transfers Overall transfer level: Needs assistance Equipment used: Rolling walker (2 wheeled) Transfers: Sit to/from Stand Sit to Stand: Min assist         General transfer comment: verbal cues for hand placement and LE management.  Ambulation/Gait Ambulation/Gait assistance: Min assist Ambulation Distance (Feet): 93 Feet Assistive device: Rolling walker (2 wheeled) Gait Pattern/deviations: Step-to pattern;Decreased step length - right;Decreased step length - left;Shuffle;Trunk flexed Gait velocity: decr   General Gait Details: cues for posture, sequence and position from [employer] Mobility    Modified Rankin (Stroke Patients Only)       Balance                                    Cognition Arousal/Alertness: Awake/alert Behavior During Therapy: WFL for tasks assessed/performed Overall Cognitive Status: Within Functional Limits for tasks assessed                      Exercises      General Comments General comments (skin integrity, edema, etc.): min assist to wash hands at sink.      Pertinent Vitals/Pain Pain Assessment: 0-10 Pain Score: 4  Pain Location: L hip Pain Descriptors / Indicators: Aching Pain Intervention(s): Limited activity within patient's tolerance;Premedicated before  session    Home Living Family/patient expects to be discharged to:: Private residence Living Arrangements: Spouse/significant other Available Help at Discharge: Family Type of Home: House Home Access: Stairs to enter Entrance Stairs-Rails: Right Home Layout: One level Home Equipment: Environmental consultant - 2 wheels;Cane - single point;Bedside commode Additional Comments: Family to bring in RW to adjust    Prior Function Level of Independence: Independent;Independent with assistive device(s)          PT Goals (current goals can now be found in the care plan section) Acute Rehab PT Goals Patient Stated Goal: walk with decreased pain PT Goal Formulation: With patient Time For Goal Achievement: 02/28/14 Potential to Achieve Goals: Good Progress towards PT goals: Progressing toward goals    Frequency  7X/week    PT Plan Current plan remains appropriate    Co-evaluation             End of Session Equipment Utilized During Treatment: Gait belt Activity Tolerance: Patient tolerated treatment well Patient left: Other (comment) (bathroom)     Time: 5400-8676 PT Time Calculation (min): 27 min  Charges:  $Gait Training: 23-37 mins                    G Codes:      [place] 06-Mar-2014, 2:35 PM

## 2014-02-21 NOTE — Progress Notes (Signed)
   Subjective: 1 Day Post-Op Procedure(s) (LRB): LEFT TOTAL HIP ARTHROPLASTY ANTERIOR APPROACH (Left) Patient reports pain as mild.   Patient seen in rounds with Dr. Wynelle Link. Husband in room at bedside. Patient is well, but has had some minor complaints of pain in the hip, requiring pain medications We will start therapy today.  Plan is to go Home after hospital stay.  Will see how she does with therapy and determine the level of her mobility.  Objective: Vital signs in last 24 hours: Temp:  [97.7 F (36.5 C)-98.5 F (36.9 C)] 98.5 F (36.9 C) (08/06 0640) Pulse Rate:  [58-85] 58 (08/06 0640) Resp:  [14-21] 16 (08/06 0745) BP: (118-225)/(51-149) 138/51 mmHg (08/06 0640) SpO2:  [97 %-100 %] 98 % (08/06 0745) Weight:  [68.947 kg (152 lb)] 68.947 kg (152 lb) (08/05 1331)  Intake/Output from previous day:  Intake/Output Summary (Last 24 hours) at 02/21/14 0821 Last data filed at 02/21/14 0710  Gross per 24 hour  Intake   2740 ml  Output   3030 ml  Net   -290 ml    Intake/Output this shift: Total I/O In: -  Out: 125 [Urine:125]  Labs:  Recent Labs  02/21/14 0433  HGB 10.9*    Recent Labs  02/21/14 0433  WBC 12.9*  RBC 3.42*  HCT 33.6*  PLT 268    Recent Labs  02/21/14 0433  NA 138  K 4.5  CL 105  CO2 22  BUN 12  CREATININE 0.73  GLUCOSE 198*  CALCIUM 8.6   No results found for this basename: LABPT, INR,  in the last 72 hours  EXAM General - Patient is Alert, Appropriate and Oriented Extremity - Neurovascular intact Sensation intact distally Dressing - dressing C/D/I Motor Function - intact, moving foot and toes well on exam.  Hemovac pulled without difficulty.  Past Medical History  Diagnosis Date  . Osteoporosis   . Hypertension   . Arthritis   . Gallstones   . Hyperlipidemia   . IBS (irritable bowel syndrome)   . Diabetes mellitus without complication   . Cancer     hx of skin cancer removal on hand     Assessment/Plan: 1 Day  Post-Op Procedure(s) (LRB): LEFT TOTAL HIP ARTHROPLASTY ANTERIOR APPROACH (Left) Principal Problem:   OA (osteoarthritis) of hip  Estimated body mass index is 27.79 kg/(m^2) as calculated from the following:   Height as of this encounter: 5\' 2"  (1.575 m).   Weight as of this encounter: 68.947 kg (152 lb). Advance diet Up with therapy Discharge home with home health as lon as she does well.  DVT Prophylaxis - Xarelto Weight Bearing As Tolerated left Leg Hemovac Pulled Begin Therapy  Arlee Muslim, PA-C Orthopaedic Surgery 02/21/2014, 8:21 AM

## 2014-02-22 LAB — CBC
HEMATOCRIT: 35.1 % — AB (ref 36.0–46.0)
HEMOGLOBIN: 11.4 g/dL — AB (ref 12.0–15.0)
MCH: 31.3 pg (ref 26.0–34.0)
MCHC: 32.5 g/dL (ref 30.0–36.0)
MCV: 96.4 fL (ref 78.0–100.0)
Platelets: 264 10*3/uL (ref 150–400)
RBC: 3.64 MIL/uL — ABNORMAL LOW (ref 3.87–5.11)
RDW: 13.4 % (ref 11.5–15.5)
WBC: 17.2 10*3/uL — AB (ref 4.0–10.5)

## 2014-02-22 LAB — BASIC METABOLIC PANEL
Anion gap: 11 (ref 5–15)
BUN: 11 mg/dL (ref 6–23)
CHLORIDE: 104 meq/L (ref 96–112)
CO2: 25 meq/L (ref 19–32)
Calcium: 9.2 mg/dL (ref 8.4–10.5)
Creatinine, Ser: 0.67 mg/dL (ref 0.50–1.10)
GFR calc Af Amer: 90 mL/min — ABNORMAL LOW (ref >90)
GFR calc non Af Amer: 77 mL/min — ABNORMAL LOW (ref >90)
Glucose, Bld: 169 mg/dL — ABNORMAL HIGH (ref 70–99)
Potassium: 4 mEq/L (ref 3.7–5.3)
Sodium: 140 mEq/L (ref 137–147)

## 2014-02-22 LAB — GLUCOSE, CAPILLARY
GLUCOSE-CAPILLARY: 133 mg/dL — AB (ref 70–99)
GLUCOSE-CAPILLARY: 149 mg/dL — AB (ref 70–99)
GLUCOSE-CAPILLARY: 127 mg/dL — AB (ref 70–99)
GLUCOSE-CAPILLARY: 186 mg/dL — AB (ref 70–99)

## 2014-02-22 MED ORDER — CYCLOBENZAPRINE HCL 10 MG PO TABS
10.0000 mg | ORAL_TABLET | Freq: Three times a day (TID) | ORAL | Status: DC | PRN
Start: 2014-02-22 — End: 2014-02-25
  Administered 2014-02-22 – 2014-02-24 (×2): 10 mg via ORAL
  Filled 2014-02-22 (×2): qty 1

## 2014-02-22 NOTE — Progress Notes (Signed)
[facility]  [facility] is providing the following services: RW  If patient discharges after hours, please call [phone].   Linward Headland 02/22/2014, 10:27 AM

## 2014-02-22 NOTE — Progress Notes (Signed)
   Subjective: 2 Days Post-Op Procedure(s) (LRB): LEFT TOTAL HIP ARTHROPLASTY ANTERIOR APPROACH (Left) Patient reports pain as mild.   Patient seen in rounds with Dr. Wynelle Link. Family in the room. Patient is well, but has had some minor complaints of pain in the hip, requiring pain medications We will start therapy today.  Plan is to go Home after hospital stay. Possible tomorrow.  Objective: Vital signs in last 24 hours: Temp:  [97.7 F (36.5 C)-99.4 F (37.4 C)] 99.1 F (37.3 C) (08/07 0630) Pulse Rate:  [70-81] 75 (08/07 0634) Resp:  [16-18] 17 (08/07 0630) BP: (147-180)/(56-82) 179/82 mmHg (08/07 0634) SpO2:  [97 %-99 %] 99 % (08/07 0630)  Intake/Output from previous day:  Intake/Output Summary (Last 24 hours) at 02/22/14 1106 Last data filed at 02/22/14 0920  Gross per 24 hour  Intake 836.25 ml  Output   2600 ml  Net -1763.75 ml    Intake/Output this shift: Total I/O In: 240 [P.O.:240] Out: 500 [Urine:500]  Labs:  Recent Labs  02/21/14 0433 02/22/14 0540  HGB 10.9* 11.4*    Recent Labs  02/21/14 0433 02/22/14 0540  WBC 12.9* 17.2*  RBC 3.42* 3.64*  HCT 33.6* 35.1*  PLT 268 264    Recent Labs  02/21/14 0433 02/22/14 0540  NA 138 140  K 4.5 4.0  CL 105 104  CO2 22 25  BUN 12 11  CREATININE 0.73 0.67  GLUCOSE 198* 169*  CALCIUM 8.6 9.2   No results found for this basename: LABPT, INR,  in the last 72 hours  EXAM General - Patient is Alert, Appropriate and Oriented Extremity - Neurovascular intact Sensation intact distally Dorsiflexion/Plantar flexion intact Dressing - dressing C/D/I Motor Function - intact, moving foot and toes well on exam.   Past Medical History  Diagnosis Date  . Osteoporosis   . Hypertension   . Arthritis   . Gallstones   . Hyperlipidemia   . IBS (irritable bowel syndrome)   . Diabetes mellitus without complication   . Cancer     hx of skin cancer removal on hand     Assessment/Plan: 2 Days Post-Op  Procedure(s) (LRB): LEFT TOTAL HIP ARTHROPLASTY ANTERIOR APPROACH (Left) Principal Problem:   OA (osteoarthritis) of hip  Estimated body mass index is 27.79 kg/(m^2) as calculated from the following:   Height as of this encounter: 5\' 2"  (1.575 m).   Weight as of this encounter: 68.947 kg (152 lb). Up with therapy Plan for discharge tomorrow  DVT Prophylaxis - Xarelto Weight Bearing As Tolerated left Leg  Arlee Muslim, PA-C Orthopaedic Surgery 02/22/2014, 11:06 AM

## 2014-02-22 NOTE — Progress Notes (Addendum)
Occupational Therapy Treatment Patient Details Name: Marissa Ryan MRN: [id number] DOB: 1924-08-12 Today's Date: 02/22/2014    History of present illness L THR - ant dir   OT comments  Pt limited by pain that increased to 9/10 toward the end of the session. Pt was min assist overall for toilet transfers and clothing management. Pain at start of session 5/10, pain during activity 7-9/10 and eased off once pt back in bed. Nursing aware.   Follow Up Recommendations  Home health OT;Supervision/Assistance - 24 hour    Equipment Recommendations  None recommended by OT    Recommendations for Other Services      Precautions / Restrictions Precautions Precautions: Fall Restrictions Weight Bearing Restrictions: No Other Position/Activity Restrictions: WBAT       Mobility Bed Mobility Overal bed mobility: Needs Assistance Bed Mobility: Sit to Supine      Sit to supine: Min assist   General bed mobility comments: assist for L LE onto bed.  Transfers Overall transfer level: Needs assistance Equipment used: Rolling walker (2 wheeled) Transfers: Sit to/from Stand Sit to Stand: Min assist         General transfer comment: verbal cues for hand placement and LE management.    Balance                                   ADL                           Toilet Transfer: Minimal assistance;RW;Ambulation;BSC   Toileting- Clothing Manipulation and Hygiene: Minimal assistance;Sit to/from stand         General ADL Comments: Granddaughter present for session. Discussed safety with how to adjust 3in1 for appropriate height, proper walker distance to self and hand placement with functional transfers. Feel pt will benefit from sponge bathing initially and let HHOT assess shower transfers and pt is agreeable. She does fatigue with activity somewhat and feel sponge bathing is a safer option initially. She requires increased time to complete all functional transfers.  Pt limited by pain this session but able to complete toilet transfer and then requested back to bed. Pt states spouse can assist with LB dressing      Vision                     Perception     Praxis      Cognition   Behavior During Therapy: WFL for tasks assessed/performed Overall Cognitive Status: Within Functional Limits for tasks assessed       Memory: Decreased short-term memory               Extremity/Trunk Assessment               Exercises    Shoulder Instructions       General Comments      Pertinent Vitals/ Pain       Pain Assessment: 0-10 Pain Score: 9  Pain Location: L hip Pain Descriptors / Indicators: Aching Pain Intervention(s): Repositioned;Ice applied (informed nursing of pain level.)  Home Living                                          Prior Functioning/Environment  Frequency Min 2X/week     Progress Toward Goals  OT Goals(current goals can now be found in the care plan section)  Progress towards OT goals: Progressing toward goals  Acute Rehab OT Goals Patient Stated Goal: walk with decreased pain  Plan Discharge plan remains appropriate    Co-evaluation                 End of Session Equipment Utilized During Treatment: Gait belt;Rolling walker   Activity Tolerance Patient limited by pain   Patient Left in bed;with call bell/phone within reach;with family/visitor present   Nurse Communication          Time: 4259-5638 OT Time Calculation (min): 30 min  Charges: OT General Charges $OT Visit: 1 Procedure OT Treatments $Self Care/Home Management : 8-22 mins $Therapeutic Activity: 8-22 mins  Jules Schick [phone] 02/22/2014, 1:31 PM

## 2014-02-22 NOTE — Progress Notes (Signed)
Physical Therapy Treatment Patient Details Name: Marissa Ryan MRN: [id number] DOB: [birth year] Today's Date: 2014/03/09    History of Present Illness L THR - ant dir    PT Comments    Progressing steadily  Follow Up Recommendations  Home health PT     Equipment Recommendations  None recommended by PT    Recommendations for Other Services OT consult     Precautions / Restrictions Precautions Precautions: Fall Restrictions Weight Bearing Restrictions: No Other Position/Activity Restrictions: WBAT    Mobility  Bed Mobility Overal bed mobility: Needs Assistance Bed Mobility: Supine to Sit     Supine to sit: Min assist     General bed mobility comments: cues for sequence and use of R LE to self assist  Transfers Overall transfer level: Needs assistance Equipment used: Rolling walker (2 wheeled) Transfers: Sit to/from Stand Sit to Stand: Min assist         General transfer comment: verbal cues for hand placement and LE management.  Ambulation/Gait Ambulation/Gait assistance: Min assist Ambulation Distance (Feet): 56 Feet Assistive device: Rolling walker (2 wheeled) Gait Pattern/deviations: Step-to pattern;Decreased step length - right;Decreased step length - left;Shuffle;Trunk flexed Gait velocity: decr   General Gait Details: cues for posture, sequence and position from [employer] Mobility    Modified Rankin (Stroke Patients Only)       Balance                                    Cognition Arousal/Alertness: Awake/alert Behavior During Therapy: WFL for tasks assessed/performed Overall Cognitive Status: Within Functional Limits for tasks assessed                      Exercises Total Joint Exercises Ankle Circles/Pumps: AROM;10 reps;Supine;Left Quad Sets: AROM;Left;10 reps;Supine Heel Slides: AAROM;Left;Supine;20 reps Hip ABduction/ADduction: AAROM;Left;Supine;20 reps    General Comments         Pertinent Vitals/Pain Pain Assessment: 0-10 Pain Score: 6  Pain Location: L hip Pain Descriptors / Indicators: Aching;Burning Pain Intervention(s): Limited activity within patient's tolerance;Premedicated before session;Ice applied    Home Living                      Prior Function            PT Goals (current goals can now be found in the care plan section) Acute Rehab PT Goals Patient Stated Goal: walk with decreased pain PT Goal Formulation: With patient Time For Goal Achievement: 02/28/14 Potential to Achieve Goals: Good Progress towards PT goals: Progressing toward goals    Frequency  7X/week    PT Plan Current plan remains appropriate    Co-evaluation             End of Session Equipment Utilized During Treatment: Gait belt Activity Tolerance: Patient tolerated treatment well Patient left: in chair;with call bell/phone within reach;with family/visitor present     Time: 0935-1010 PT Time Calculation (min): 35 min  Charges:  $Gait Training: 8-22 mins $Therapeutic Exercise: 8-22 mins                    G Codes:      Viral Schramm 03-09-2014, 12:39 PM

## 2014-02-22 NOTE — Progress Notes (Signed)
Red rash noted around right eye at 0225 when pt up to bathroom,no meds or prns given 2hrs prior to rash noted.Cleared within 1 hr after benadryl elixir given at 0230. Pt denies any rash of this type ever occurring at home . Aggie Moats D

## 2014-02-23 LAB — URINALYSIS, ROUTINE W REFLEX MICROSCOPIC
Bilirubin Urine: NEGATIVE
Glucose, UA: NEGATIVE mg/dL
Ketones, ur: 40 mg/dL — AB
Leukocytes, UA: NEGATIVE
NITRITE: NEGATIVE
PROTEIN: 30 mg/dL — AB
SPECIFIC GRAVITY, URINE: 1.015 (ref 1.005–1.030)
UROBILINOGEN UA: 0.2 mg/dL (ref 0.0–1.0)
pH: 6.5 (ref 5.0–8.0)

## 2014-02-23 LAB — CBC
HCT: 32.5 % — ABNORMAL LOW (ref 36.0–46.0)
HEMOGLOBIN: 10.5 g/dL — AB (ref 12.0–15.0)
MCH: 30.9 pg (ref 26.0–34.0)
MCHC: 32.3 g/dL (ref 30.0–36.0)
MCV: 95.6 fL (ref 78.0–100.0)
Platelets: 260 10*3/uL (ref 150–400)
RBC: 3.4 MIL/uL — ABNORMAL LOW (ref 3.87–5.11)
RDW: 13.4 % (ref 11.5–15.5)
WBC: 16.1 10*3/uL — AB (ref 4.0–10.5)

## 2014-02-23 LAB — URINE MICROSCOPIC-ADD ON

## 2014-02-23 LAB — GLUCOSE, CAPILLARY
Glucose-Capillary: 175 mg/dL — ABNORMAL HIGH (ref 70–99)
Glucose-Capillary: 255 mg/dL — ABNORMAL HIGH (ref 70–99)
Glucose-Capillary: 169 mg/dL — ABNORMAL HIGH (ref 70–99)
Glucose-Capillary: 237 mg/dL — ABNORMAL HIGH (ref 70–99)

## 2014-02-23 MED ORDER — BISACODYL 10 MG RE SUPP
10.0000 mg | Freq: Once | RECTAL | Status: AC
  Administered 2014-02-23: 10 mg via RECTAL
  Filled 2014-02-23: qty 1

## 2014-02-23 NOTE — Progress Notes (Signed)
Physical Therapy Treatment Patient Details Name: Marissa Ryan MRN: [id number] DOB: 1924-11-25 Today's Date: 2014-03-12    History of Present Illness L THR - ant dir    PT Comments    Pt pleasant and cooperative but demonstrating increased confusion vs last session and with increased time for all tasks and increased difficulty following cues.  Follow Up Recommendations  SNF     Equipment Recommendations  None recommended by PT    Recommendations for Other Services OT consult     Precautions / Restrictions Precautions Precautions: Fall Restrictions Weight Bearing Restrictions: No Other Position/Activity Restrictions: WBAT    Mobility  Bed Mobility Overal bed mobility: Needs Assistance Bed Mobility: Sit to Supine       Sit to supine: Mod assist   General bed mobility comments: assist for Bil LEs into bed with increased assist and cues for positioning  Transfers Overall transfer level: Needs assistance Equipment used: Rolling walker (2 wheeled) Transfers: Sit to/from Stand Sit to Stand: Mod assist         General transfer comment: multimodal cues for hand/leg position  Ambulation/Gait Ambulation/Gait assistance: Mod assist Ambulation Distance (Feet): 21 Feet (twice) Assistive device: Rolling walker (2 wheeled) Gait Pattern/deviations: Step-to pattern;Decreased step length - right;Decreased step length - left;Shuffle;Trunk flexed;Antalgic Gait velocity: decr   General Gait Details: cues for posture, sequence and position from [employer]            Wheelchair Mobility    Modified Rankin (Stroke Patients Only)       Balance                                    Cognition Arousal/Alertness: Awake/alert Behavior During Therapy: WFL for tasks assessed/performed Overall Cognitive Status: Impaired/Different from baseline Area of Impairment: Following commands     Memory: Decreased short-term memory Following Commands: Follows one  step commands inconsistently;Follows one step commands with increased time       General Comments: decreased processing when following commands    Exercises      General Comments        Pertinent Vitals/Pain Pain Assessment: 0-10 Pain Score: 5  Pain Location: L hip Pain Descriptors / Indicators: Sore;Aching Pain Intervention(s): Limited activity within patient's tolerance;Ice applied;Repositioned    Home Living                      Prior Function            PT Goals (current goals can now be found in the care plan section) Acute Rehab PT Goals Patient Stated Goal: walk with decreased pain PT Goal Formulation: With patient Time For Goal Achievement: 02/28/14 Potential to Achieve Goals: Good Progress towards PT goals: Progressing toward goals    Frequency  7X/week    PT Plan Discharge plan needs to be updated    Co-evaluation             End of Session Equipment Utilized During Treatment: Gait belt Activity Tolerance: Patient limited by fatigue Patient left: in bed;with call bell/phone within reach     Time: 1455-1535 PT Time Calculation (min): 40 min  Charges:  $Gait Training: 23-37 mins $Therapeutic Activity: 8-22 mins                    G Codes:      Colie Josten 03/12/14, 4:39 PM

## 2014-02-23 NOTE — Progress Notes (Signed)
Occupational Therapy Treatment Patient Details Name: Marissa Ryan MRN: [id number] DOB: [birth year] Today's Date: 02/23/2014    History of present illness L THR - ant dir   OT comments  Pt fatiques easily; rest breaks given.  She had more difficulty following commands--multimodal cues given.  Pt would benefit from continued OT at SNF to increase independence and safety prior to home  Follow Up Recommendations  SNF    Equipment Recommendations  None recommended by OT    Recommendations for Other Services      Precautions / Restrictions Precautions Precautions: Fall Restrictions Weight Bearing Restrictions: No       Mobility Bed Mobility                  Transfers   Equipment used: Rolling walker (2 wheeled) Transfers: Sit to/from Stand Sit to Stand: Min assist         General transfer comment: multimodal cues for hand/leg position    Balance                                   ADL Overall ADL's : Needs assistance/impaired     Grooming: Min guard;Standing;Wash/dry hands;Wash/dry face       Lower Body Bathing: Moderate assistance;Sit to/from stand           Toilet Transfer: Minimal assistance;BSC;RW;Ambulation   Toileting- Water quality scientist and Hygiene: Min guard;Sit to/from stand         General ADL Comments: Pt ambulated to bathroom.  Had difficulty advancing LLE at times and inched foot forward.  Assisted with lift then pt able to advance.  Cued to lift toes.  Pt needs extra time for all activities.  worked on positioning on [facility] over commode for pt's comfort.  She was unable to urinate.  Assisted with bathing from commode and then pt went to sink to use her eye drops and perform other grooming tasks.  Pt wanted to try to sit on commode again, so 3:1 brought up to her.  She was still unable to urinate.  Returned to sink to wash hands and OT called for NT to bring recliner to door as pt was very fatiqued.  She had difficulty  processing cues at times--where to place hands, sidestepping, stretching leg forward.  Multimodal cues given.      Vision                     Perception     Praxis      Cognition   Behavior During Therapy: [facility] for tasks assessed/performed Overall Cognitive Status: Impaired/Different from baseline Area of Impairment: Following commands     Memory: Decreased short-term memory  Following Commands: Follows one step commands with increased time (and multimodal cues)       General Comments: decreased processing when following commands    Extremity/Trunk Assessment               Exercises     Shoulder Instructions       General Comments      Pertinent Vitals/ Pain       Pain Assessment: 0-10 Pain Score: 5  Pain Location: L hip Pain Descriptors / Indicators: Aching Pain Intervention(s): Repositioned;Limited activity within patient's tolerance  Home Living  Prior Functioning/Environment              Frequency Min 2X/week     Progress Toward Goals  OT Goals(current goals can now be found in the care plan section)  Progress towards OT goals: Progressing toward goals     Plan Discharge plan needs to be updated    Co-evaluation                 End of Session     Activity Tolerance Patient limited by fatigue   Patient Left in chair;with call bell/phone within reach;with family/visitor present   Nurse Communication          Time: 0812-0907 OT Time Calculation (min): 55 min  Charges: OT General Charges $OT Visit: 1 Procedure OT Treatments $Self Care/Home Management : 53-67 mins  Dandre Sisler 02/23/2014, 10:36 AM  Lesle Chris, OTR/L [phone] 02/23/2014

## 2014-02-23 NOTE — Progress Notes (Signed)
   Subjective: 3 Days Post-Op Procedure(s) (LRB): LEFT TOTAL HIP ARTHROPLASTY ANTERIOR APPROACH (Left) Patient reports pain as mild.   Patient seen in rounds with Dr. Wynelle Link.  Husband in room.  Had a difficult night.  Staff states she got confused some last night. Patient is well, but has had some minor complaints of pain in the hip, requiring pain medications We will start therapy today.  Plan is now to go to Skilled nursing facility after hospital stay.  Objective: Vital signs in last 24 hours: Temp:  [98.5 F (36.9 C)-98.9 F (37.2 C)] 98.9 F (37.2 C) (08/08 0546) Pulse Rate:  [69-94] 94 (08/08 0546) Resp:  [16-18] 18 (08/08 0546) BP: (159-170)/(48-62) 159/62 mmHg (08/08 0546) SpO2:  [96 %-100 %] 96 % (08/08 0546)  Intake/Output from previous day:  Intake/Output Summary (Last 24 hours) at 02/23/14 0732 Last data filed at 02/23/14 0356  Gross per 24 hour  Intake    420 ml  Output   1100 ml  Net   -680 ml     Labs:  Recent Labs  02/21/14 0433 02/22/14 0540 02/23/14 0520  HGB 10.9* 11.4* 10.5*    Recent Labs  02/22/14 0540 02/23/14 0520  WBC 17.2* 16.1*  RBC 3.64* 3.40*  HCT 35.1* 32.5*  PLT 264 260    Recent Labs  02/21/14 0433 02/22/14 0540  NA 138 140  K 4.5 4.0  CL 105 104  CO2 22 25  BUN 12 11  CREATININE 0.73 0.67  GLUCOSE 198* 169*  CALCIUM 8.6 9.2   No results found for this basename: LABPT, INR,  in the last 72 hours  EXAM General - Patient is Alert and Appropriate Extremity - Neurovascular intact Sensation intact distally Dressing - dressing C/D/I Motor Function - intact, moving foot and toes well on exam.   Past Medical History  Diagnosis Date  . Osteoporosis   . Hypertension   . Arthritis   . Gallstones   . Hyperlipidemia   . IBS (irritable bowel syndrome)   . Diabetes mellitus without complication   . Cancer     hx of skin cancer removal on hand     Assessment/Plan: 3 Days Post-Op Procedure(s) (LRB): LEFT TOTAL HIP  ARTHROPLASTY ANTERIOR APPROACH (Left) Principal Problem:   OA (osteoarthritis) of hip  Estimated body mass index is 27.79 kg/(m^2) as calculated from the following:   Height as of this encounter: 5\' 2"  (1.575 m).   Weight as of this encounter: 68.947 kg (152 lb). Up with therapy Discharge to SNF since she has had some difficulty with getting up and also had some confusion last night.  DVT Prophylaxis - Xarelto Weight Bearing As Tolerated left Leg   Arlee Muslim, PA-C Orthopaedic Surgery 02/23/2014, 7:32 AM

## 2014-02-23 NOTE — Clinical Social Work Placement (Signed)
Clinical Social Work Department CLINICAL SOCIAL WORK PLACEMENT NOTE 02/23/2014  Patient:  Marissa Ryan, Marissa Ryan  Account Number:  [id number] Admit date:  02/20/2014  Clinical Social Worker:  Lovey Newcomer  Date/time:  02/23/2014 10:31 AM  Clinical Social Work is seeking post-discharge placement for this patient at the following level of care:   [place]   (*CSW will update this form in Epic as items are completed)   02/23/2014  Patient/family provided with [place] Department of Clinical Social Work's list of facilities offering this level of care within the geographic area requested by the patient (or if unable, by the patient's family).  02/23/2014  Patient/family informed of their freedom to choose among providers that offer the needed level of care, that participate in Medicare, Medicaid or managed care program needed by the patient, have an available bed and are willing to accept the patient.  02/23/2014  Patient/family informed of [facility]' ownership interest in [facility], as well as of the fact that they are under no obligation to receive care at this facility.  PASARR submitted to [employer] on 02/23/2014 PASARR number received on 02/23/2014  FL2 transmitted to all facilities in geographic area requested by pt/family on  02/23/2014 FL2 transmitted to all facilities within larger geographic area on   Patient informed that his/her managed care company has contracts with or will negotiate with  certain facilities, including the following:     Patient/family informed of bed offers received:   Patient chooses bed at  Physician recommends and patient chooses bed at    Patient to be transferred to  on   Patient to be transferred to facility by  Patient and family notified of transfer on  Name of family member notified:    The following physician request were entered in Epic:   Additional Comments:    [name] MSW, [place], [place], [phone]

## 2014-02-23 NOTE — Clinical Social Work Psychosocial (Signed)
Clinical Social Work Department BRIEF PSYCHOSOCIAL ASSESSMENT 02/23/2014  Patient:  Marissa Ryan, Marissa Ryan     Account Number:  [id number]     Admit date:  02/20/2014  Clinical Social Worker:  Lovey Newcomer  Date/Time:  02/23/2014 10:16 AM  Referred by:  Physician  Date Referred:  02/23/2014 Referred for  SNF Placement   Other Referral:   Interview type:  Family Other interview type:   Patient and wife both interviewed to complete assessment.    PSYCHOSOCIAL DATA Living Status:  HUSBAND Admitted from facility:   Level of care:   Primary support name:  Marissa Ryan Primary support relationship to patient:  SPOUSE Degree of support available:   Support is good.    CURRENT CONCERNS Current Concerns  Post-Acute Placement   Other Concerns:   MD please clarify when patient is ready for DC. CSWs can assist with getting patient discharged to a SNF over the weekend if patient is ready for DC.    SOCIAL WORK ASSESSMENT / PLAN CSW received call from patient's RN Saturday morning stating that patient was to DC home with HHPT, but husband of patient is now requesting SNF placement for patient. CSW spoke with husband over phone to complete assessment as chart reports that patient has experienced some confusion over night. Husband states that he would like for patient to DC to a SNF when ready. CSW explained SNF search/placement process and answered husbands questions. Husband states that he and his wife live alone and he feels that his wife requires more assistance than he is able to provide in the home. Husband states that their main family support is a daughter in law that lives in [place]. CSW also visited patient in room to drop off list of SNF facilities. Patient was in good spirits this morning and thanked CSW for coming by. Patient's husband states he has a preference for [facility]. CSW explained that this cannot be guaranteed, but referrals would be made.    Assessment/plan status:  Psychosocial Support/Ongoing Assessment of Needs Other assessment/ plan:   CSW will complete FL2 and place on chart, make referrals, and complete PASRR.   Information/referral to community resources:   CSW contact information and SNF list given.    PATIENT'S/FAMILY'S RESPONSE TO PLAN OF CARE: Patient and husband are agreeable to a DC to SNF once patient is medically stable. CSW will follow up with bed offers.       Marissa Ryan MSW, [place], [place], [phone]

## 2014-02-23 NOTE — Clinical Social Work Note (Signed)
FL2 placed on chart for MD signature.  [name] MSW, [place], [place], [phone]

## 2014-02-24 LAB — GLUCOSE, CAPILLARY
GLUCOSE-CAPILLARY: 152 mg/dL — AB (ref 70–99)
Glucose-Capillary: 184 mg/dL — ABNORMAL HIGH (ref 70–99)
Glucose-Capillary: 285 mg/dL — ABNORMAL HIGH (ref 70–99)
Glucose-Capillary: 221 mg/dL — ABNORMAL HIGH (ref 70–99)

## 2014-02-24 LAB — URINE CULTURE
Colony Count: NO GROWTH
Culture: NO GROWTH
Special Requests: NORMAL

## 2014-02-24 NOTE — Discharge Summary (Signed)
Physician Discharge Summary   Patient ID: Marissa Ryan MRN: [id number] DOB/AGE: [birth year] 80 y.o.  Admit date: 02/20/2014 Discharge date: 02/25/2014  Primary Diagnosis:  Osteoarthritis of the Left hip.   Admission Diagnoses:  Past Medical History  Diagnosis Date  . Osteoporosis   . Hypertension   . Arthritis   . Gallstones   . Hyperlipidemia   . IBS (irritable bowel syndrome)   . Diabetes mellitus without complication   . Cancer     hx of skin cancer removal on hand    Discharge Diagnoses:   Principal Problem:   OA (osteoarthritis) of hip  Estimated body mass index is 27.79 kg/(m^2) as calculated from the following:   Height as of this encounter: 5' 2"  (1.575 m).   Weight as of this encounter: 68.947 kg (152 lb).  Procedure(s) (LRB): LEFT TOTAL HIP ARTHROPLASTY ANTERIOR APPROACH (Left)   Consults: None  HPI: Marissa Ryan is a 80 y.o. female who has advanced end-  stage arthritis of his left hip with progressively worsening pain and  dysfunction.The patient has failed nonoperative management and presents for  total hip arthroplasty.   Laboratory Data: Admission on 02/20/2014  Component Date Value Ref Range Status  . ABO/RH(D) 02/20/2014 A NEG   Final  . Antibody Screen 02/20/2014 NEG   Final  . Sample Expiration 02/20/2014 02/23/2014   Final  . Glucose-Capillary 02/20/2014 163* 70 - 99 mg/dL Final  . Comment 1 02/20/2014 Notify RN   Final  . ABO/RH(D) 02/20/2014 A NEG   Final  . Glucose-Capillary 02/20/2014 149* 70 - 99 mg/dL Final  . Comment 1 02/20/2014 Documented in Chart   Final  . Comment 2 02/20/2014 Notify RN   Final  . WBC 02/21/2014 12.9* 4.0 - 10.5 K/uL Final  . RBC 02/21/2014 3.42* 3.87 - 5.11 MIL/uL Final  . Hemoglobin 02/21/2014 10.9* 12.0 - 15.0 g/dL Final  . HCT 02/21/2014 33.6* 36.0 - 46.0 % Final  . MCV 02/21/2014 98.2  78.0 - 100.0 fL Final  . MCH 02/21/2014 31.9  26.0 - 34.0 pg Final  . MCHC 02/21/2014 32.4  30.0 - 36.0 g/dL Final  . RDW  02/21/2014 13.4  11.5 - 15.5 % Final  . Platelets 02/21/2014 268  150 - 400 K/uL Final  . Sodium 02/21/2014 138  137 - 147 mEq/L Final  . Potassium 02/21/2014 4.5  3.7 - 5.3 mEq/L Final  . Chloride 02/21/2014 105  96 - 112 mEq/L Final  . CO2 02/21/2014 22  19 - 32 mEq/L Final  . Glucose, Bld 02/21/2014 198* 70 - 99 mg/dL Final  . BUN 02/21/2014 12  6 - 23 mg/dL Final  . Creatinine, Ser 02/21/2014 0.73  0.50 - 1.10 mg/dL Final  . Calcium 02/21/2014 8.6  8.4 - 10.5 mg/dL Final  . GFR calc non Af Amer 02/21/2014 75* >90 mL/min Final  . GFR calc Af Amer 02/21/2014 87* >90 mL/min Final   Comment: (NOTE)                          The eGFR has been calculated using the CKD EPI equation.                          This calculation has not been validated in all clinical situations.  eGFR's persistently <90 mL/min signify possible Chronic Kidney                          Disease.  . Anion gap 02/21/2014 11  5 - 15 Final  . Glucose-Capillary 02/20/2014 320* 70 - 99 mg/dL Final  . Glucose-Capillary 02/20/2014 275* 70 - 99 mg/dL Final  . Glucose-Capillary 02/21/2014 168* 70 - 99 mg/dL Final  . Comment 1 02/21/2014 Notify RN   Final  . Comment 2 02/21/2014 Documented in Chart   Final  . Glucose-Capillary 02/21/2014 166* 70 - 99 mg/dL Final  . Glucose-Capillary 02/21/2014 216* 70 - 99 mg/dL Final  . Comment 1 02/21/2014 Notify RN   Final  . Comment 2 02/21/2014 Documented in Chart   Final  . WBC 02/22/2014 17.2* 4.0 - 10.5 K/uL Final  . RBC 02/22/2014 3.64* 3.87 - 5.11 MIL/uL Final  . Hemoglobin 02/22/2014 11.4* 12.0 - 15.0 g/dL Final  . HCT 02/22/2014 35.1* 36.0 - 46.0 % Final  . MCV 02/22/2014 96.4  78.0 - 100.0 fL Final  . MCH 02/22/2014 31.3  26.0 - 34.0 pg Final  . MCHC 02/22/2014 32.5  30.0 - 36.0 g/dL Final  . RDW 02/22/2014 13.4  11.5 - 15.5 % Final  . Platelets 02/22/2014 264  150 - 400 K/uL Final  . Sodium 02/22/2014 140  137 - 147 mEq/L Final  . Potassium  02/22/2014 4.0  3.7 - 5.3 mEq/L Final  . Chloride 02/22/2014 104  96 - 112 mEq/L Final  . CO2 02/22/2014 25  19 - 32 mEq/L Final  . Glucose, Bld 02/22/2014 169* 70 - 99 mg/dL Final  . BUN 02/22/2014 11  6 - 23 mg/dL Final  . Creatinine, Ser 02/22/2014 0.67  0.50 - 1.10 mg/dL Final  . Calcium 02/22/2014 9.2  8.4 - 10.5 mg/dL Final  . GFR calc non Af Amer 02/22/2014 77* >90 mL/min Final  . GFR calc Af Amer 02/22/2014 90* >90 mL/min Final   Comment: (NOTE)                          The eGFR has been calculated using the CKD EPI equation.                          This calculation has not been validated in all clinical situations.                          eGFR's persistently <90 mL/min signify possible Chronic Kidney                          Disease.  . Anion gap 02/22/2014 11  5 - 15 Final  . Glucose-Capillary 02/21/2014 253* 70 - 99 mg/dL Final  . Glucose-Capillary 02/22/2014 133* 70 - 99 mg/dL Final  . Glucose-Capillary 02/22/2014 149* 70 - 99 mg/dL Final  . WBC 02/23/2014 16.1* 4.0 - 10.5 K/uL Final  . RBC 02/23/2014 3.40* 3.87 - 5.11 MIL/uL Final  . Hemoglobin 02/23/2014 10.5* 12.0 - 15.0 g/dL Final  . HCT 02/23/2014 32.5* 36.0 - 46.0 % Final  . MCV 02/23/2014 95.6  78.0 - 100.0 fL Final  . MCH 02/23/2014 30.9  26.0 - 34.0 pg Final  . MCHC 02/23/2014 32.3  30.0 - 36.0 g/dL Final  . RDW 02/23/2014 13.4  11.5 -  15.5 % Final  . Platelets 02/23/2014 260  150 - 400 K/uL Final  . Glucose-Capillary 02/22/2014 127* 70 - 99 mg/dL Final  . Glucose-Capillary 02/22/2014 186* 70 - 99 mg/dL Final  . Glucose-Capillary 02/23/2014 175* 70 - 99 mg/dL Final  . Color, Urine 02/23/2014 YELLOW  YELLOW Final  . APPearance 02/23/2014 CLOUDY* CLEAR Final  . Specific Gravity, Urine 02/23/2014 1.015  1.005 - 1.030 Final  . pH 02/23/2014 6.5  5.0 - 8.0 Final  . Glucose, UA 02/23/2014 NEGATIVE  NEGATIVE mg/dL Final  . Hgb urine dipstick 02/23/2014 SMALL* NEGATIVE Final  . Bilirubin Urine 02/23/2014 NEGATIVE   NEGATIVE Final  . Ketones, ur 02/23/2014 40* NEGATIVE mg/dL Final  . Protein, ur 02/23/2014 30* NEGATIVE mg/dL Final  . Urobilinogen, UA 02/23/2014 0.2  0.0 - 1.0 mg/dL Final  . Nitrite 02/23/2014 NEGATIVE  NEGATIVE Final  . Leukocytes, UA 02/23/2014 NEGATIVE  NEGATIVE Final  . Specimen Description 02/23/2014 URINE, CLEAN CATCH   Final  . Special Requests 02/23/2014 Normal   Final  . Culture  Setup Time 02/23/2014    Final                   Value:02/23/2014 13:40                         Performed at [facility]  . Colony Count 02/23/2014    Final                   Value:NO GROWTH                         Performed at [facility]  . Culture 02/23/2014    Final                   Value:NO GROWTH                         Performed at [facility]  . Report Status 02/23/2014 02/24/2014 FINAL   Final  . Squamous Epithelial / LPF 02/23/2014 RARE  RARE Final  . WBC, UA 02/23/2014 3-6  <3 WBC/hpf Final  . RBC / HPF 02/23/2014 0-2  <3 RBC/hpf Final  . Glucose-Capillary 02/23/2014 255* 70 - 99 mg/dL Final  . Glucose-Capillary 02/23/2014 169* 70 - 99 mg/dL Final  . Glucose-Capillary 02/23/2014 237* 70 - 99 mg/dL Final  . Comment 1 02/23/2014 Notify RN   Final  . Glucose-Capillary 02/24/2014 184* 70 - 99 mg/dL Final  . Comment 1 02/24/2014 Documented in Chart   Final  . Comment 2 02/24/2014 Notify RN   Final  . Glucose-Capillary 02/24/2014 285* 70 - 99 mg/dL Final  . Comment 1 02/24/2014 Documented in Chart   Final  . Comment 2 02/24/2014 Notify RN   Final  . Glucose-Capillary 02/24/2014 152* 70 - 99 mg/dL Final  . Comment 1 02/24/2014 Documented in Chart   Final  . Comment 2 02/24/2014 Notify RN   Final  . Glucose-Capillary 02/24/2014 221* 70 - 99 mg/dL Final  . Comment 1 02/24/2014 Notify RN   Final  Hospital Outpatient Visit on 02/12/2014  Component Date Value Ref Range Status  . aPTT 02/12/2014 29  24 - 37 seconds Final  . WBC 02/12/2014 8.2  4.0 - 10.5 K/uL  Final  . RBC 02/12/2014 4.04  3.87 - 5.11 MIL/uL Final  . Hemoglobin 02/12/2014 12.7  12.0 - 15.0 g/dL Final  . HCT 02/12/2014 40.2  36.0 - 46.0 % Final  . MCV 02/12/2014 99.5  78.0 - 100.0 fL Final  . MCH 02/12/2014 31.4  26.0 - 34.0 pg Final  . MCHC 02/12/2014 31.6  30.0 - 36.0 g/dL Final  . RDW 02/12/2014 13.4  11.5 - 15.5 % Final  . Platelets 02/12/2014 336  150 - 400 K/uL Final  . Sodium 02/12/2014 138  137 - 147 mEq/L Final  . Potassium 02/12/2014 4.8  3.7 - 5.3 mEq/L Final  . Chloride 02/12/2014 100  96 - 112 mEq/L Final  . CO2 02/12/2014 28  19 - 32 mEq/L Final  . Glucose, Bld 02/12/2014 145* 70 - 99 mg/dL Final  . BUN 02/12/2014 10  6 - 23 mg/dL Final  . Creatinine, Ser 02/12/2014 0.75  0.50 - 1.10 mg/dL Final  . Calcium 02/12/2014 10.1  8.4 - 10.5 mg/dL Final  . Total Protein 02/12/2014 7.1  6.0 - 8.3 g/dL Final  . Albumin 02/12/2014 3.8  3.5 - 5.2 g/dL Final  . AST 02/12/2014 21  0 - 37 U/L Final  . ALT 02/12/2014 18  0 - 35 U/L Final  . Alkaline Phosphatase 02/12/2014 51  39 - 117 U/L Final  . Total Bilirubin 02/12/2014 0.2* 0.3 - 1.2 mg/dL Final  . GFR calc non Af Amer 02/12/2014 75* >90 mL/min Final  . GFR calc Af Amer 02/12/2014 86* >90 mL/min Final   Comment: (NOTE)                          The eGFR has been calculated using the CKD EPI equation.                          This calculation has not been validated in all clinical situations.                          eGFR's persistently <90 mL/min signify possible Chronic Kidney                          Disease.  . Anion gap 02/12/2014 10  5 - 15 Final  . Prothrombin Time 02/12/2014 12.2  11.6 - 15.2 seconds Final  . INR 02/12/2014 0.90  0.00 - 1.49 Final  . Color, Urine 02/12/2014 YELLOW  YELLOW Final  . APPearance 02/12/2014 CLEAR  CLEAR Final  . Specific Gravity, Urine 02/12/2014 1.012  1.005 - 1.030 Final  . pH 02/12/2014 5.5  5.0 - 8.0 Final  . Glucose, UA 02/12/2014 NEGATIVE  NEGATIVE mg/dL Final  . Hgb urine  dipstick 02/12/2014 NEGATIVE  NEGATIVE Final  . Bilirubin Urine 02/12/2014 NEGATIVE  NEGATIVE Final  . Ketones, ur 02/12/2014 NEGATIVE  NEGATIVE mg/dL Final  . Protein, ur 02/12/2014 NEGATIVE  NEGATIVE mg/dL Final  . Urobilinogen, UA 02/12/2014 0.2  0.0 - 1.0 mg/dL Final  . Nitrite 02/12/2014 NEGATIVE  NEGATIVE Final  . Leukocytes, UA 02/12/2014 TRACE* NEGATIVE Final  . Squamous Epithelial / LPF 02/12/2014 RARE  RARE Final  . WBC, UA 02/12/2014 0-2  <3 WBC/hpf Final  . MRSA, PCR 02/12/2014 NEGATIVE  NEGATIVE Final  . Staphylococcus aureus 02/12/2014 NEGATIVE  NEGATIVE Final   Comment:  The Xpert SA Assay (FDA                          approved for NASAL specimens                          in patients over 85 years of age),                          is one component of                          a comprehensive surveillance                          program.  Test performance has                          been validated by [employer] for patients greater                          than or equal to 11 year old.                          It is not intended                          to diagnose infection nor to                          guide or monitor treatment.     X-Rays:Dg Chest 2 View  02/12/2014   CLINICAL DATA:  Preoperative radiograph for left total hip replacement. Nonsmoker.  EXAM: CHEST  2 VIEW  COMPARISON:  06/01/2010  FINDINGS: The cardiomediastinal contours are within normal range. Mild linear right upper lung opacity, favor atelectasis or scarring per No confluent airspace opacity, pleural effusion, or pneumothorax. Mild multilevel degenerative changes and mild mid and lower thoracic compression deformities, similar to prior. Surgical clips right upper quadrant.  IMPRESSION: No radiographic evidence of active cardiopulmonary disease.   Electronically Signed   By: Carlos Levering M.D.   On: 02/12/2014 14:13   Dg Hip Complete  Left  02/12/2014   CLINICAL DATA:  Preoperative radiograph for LEFT total hip arthroplasty.  EXAM: LEFT HIP - COMPLETE 2+ VIEW  COMPARISON:  CT 03/03/2013.  FINDINGS: Severe LEFT hip osteoarthritis is present with complete loss of the joint space and large marginal osteophytes. Pubic symphysis degenerative disease. RIGHT hip osteoarthritis is mild. Calcific tendinitis of the RIGHT gluteal tendons is noted. Scoliosis and lumbar spondylosis. No destructive osseous lesions. There is no fracture. Atherosclerosis is present, most notable in the femoral vessels and iliofemoral system.  IMPRESSION: Severe LEFT hip osteoarthritis.  No acute osseous abnormality.   Electronically Signed   By: Dereck Ligas M.D.   On: 02/12/2014 14:13   Dg Pelvis Portable  02/20/2014   CLINICAL DATA:  Osteoarthritis of the left hip.  EXAM: DG C-ARM 1-60 MIN - NRPT [facility]; PORTABLE PELVIS 1-2 VIEWS  COMPARISON:  CT scan dated 03/03/2013  FINDINGS: Acetabular and  femoral components of the new left total hip prosthesis appear in excellent position in the AP projection. Soft tissue drain in place. No fractures.  IMPRESSION: Satisfactory appearance of the pelvis after left total hip prosthesis insertion. C-arm imaging provided intraoperatively.   Electronically Signed   By: Rozetta Nunnery M.D.   On: 02/20/2014 18:59   Dg C-arm 1-60 Min-no Report  02/20/2014   CLINICAL DATA:  Osteoarthritis of the left hip.  EXAM: DG C-ARM 1-60 MIN - [facility]PT [facility]; PORTABLE PELVIS 1-2 VIEWS  COMPARISON:  CT scan dated 03/03/2013  FINDINGS: Acetabular and femoral components of the new left total hip prosthesis appear in excellent position in the AP projection. Soft tissue drain in place. No fractures.  IMPRESSION: Satisfactory appearance of the pelvis after left total hip prosthesis insertion. C-arm imaging provided intraoperatively.   Electronically Signed   By: Rozetta Nunnery M.D.   On: 02/20/2014 18:59    EKG: Orders placed in visit on 12/03/13  . EKG 12-LEAD      Hospital Course: Patient was admitted to [facility] and taken to the OR and underwent the above state procedure without complications.  Patient tolerated the procedure well and was later transferred to the recovery room and then to the orthopaedic floor for postoperative care.  They were given PO and IV analgesics for pain control following their surgery.  They were given 24 hours of postoperative antibiotics of  Anti-infectives   Start     Dose/Rate Route Frequency Ordered Stop   02/21/14 0600  vancomycin (VANCOCIN) IVPB 1000 mg/200 mL premix     1,000 mg 200 mL/hr over 60 Minutes Intravenous On call to O.R. 02/20/14 1239 02/20/14 1536   02/21/14 0400  vancomycin (VANCOCIN) IVPB 1000 mg/200 mL premix     1,000 mg 200 mL/hr over 60 Minutes Intravenous Every 12 hours 02/20/14 1857 02/21/14 0538     and started on DVT prophylaxis in the form of Xarelto.   PT and OT were ordered for total hip protocol.  The patient was allowed to be WBAT with therapy. Discharge planning was consulted to help with postop disposition and equipment needs.  Patient had a decent night on the evening of surgery.  They started to get up OOB with therapy on day one.  Hemovac drain was pulled without difficulty. Initial plan was to go home with family. Continued to work with therapy into day two.  Dressing was changed on day two and the incision was healing well.  On POD 3, the patient was noted to have had a difficult night the night before.  The staff stated that she had gotten a little cnofused but was alert and appropriate on rounds.  Felt to be likely due to medications  Incision was healing well.  Patient was seen in rounds and was originally set up to go home but it was decided that she might need SNF.  Social worker became involved and she remained in the hospital over the weekend receiving therapy each day.  She was stable without complaints on POD 4. She was seen again on POD 5, Monday, by Dr. Wynelle Link and she  was ready to go to the SNF.  Diet: Cardiac diet and Diabetic diet Activity:WBAT Follow-up:in 2 weeks Disposition - Skilled nursing facility Discharged Condition: stable       Discharge Instructions   Call MD / Call 911    Complete by:  As directed   If you experience chest pain or shortness of breath,  CALL 911 and be transported to the hospital emergency room.  If you develope a fever above 101 F, pus (white drainage) or increased drainage or redness at the wound, or calf pain, call your surgeon's office.     Change dressing    Complete by:  As directed   You may change your dressing dressing daily with sterile 4 x 4 inch gauze dressing and paper tape.  Do not submerge the incision under water.     Constipation Prevention    Complete by:  As directed   Drink plenty of fluids.  Prune juice may be helpful.  You may use a stool softener, such as Colace (over the counter) 100 mg twice a day.  Use MiraLax (over the counter) for constipation as needed.     Diet - low sodium heart healthy    Complete by:  As directed      Diet Carb Modified    Complete by:  As directed      Discharge instructions    Complete by:  As directed   Pick up stool softner and laxative for home. Do not submerge incision under water. May shower. Continue to use ice for pain and swelling from surgery.  Total Hip Protocol.  Take Xarelto for two and a half more weeks, then discontinue Xarelto. Once the patient has completed the Xarelto, they may resume the 81 mg Aspirin.  When discharged from the skilled rehab facility, please have the facility set up the patient's [place] prior to being released.  Also provide the patient with their medications at time of release from the facility to include their pain medication, the muscle relaxants, and their blood thinner medication.  If the patient is still at the rehab facility at time of follow up appointment, please also assist the patient in arranging  follow up appointment in our office and any transportation needs.     Do not sit on low chairs, stoools or toilet seats, as it may be difficult to get up from low surfaces    Complete by:  As directed      Driving restrictions    Complete by:  As directed   No driving until released by the physician.     Increase activity slowly as tolerated    Complete by:  As directed      Lifting restrictions    Complete by:  As directed   No lifting until released by the physician.     Patient may shower    Complete by:  As directed   You may shower without a dressing once there is no drainage.  Do not wash over the wound.  If drainage remains, do not shower until drainage stops.     TED hose    Complete by:  As directed   Use stockings (TED hose) for 3 weeks on both leg(s).  You may remove them at night for sleeping.     Weight bearing as tolerated    Complete by:  As directed             Medication List    STOP taking these medications       aspirin 81 MG tablet     calcium carbonate 500 MG chewable tablet  Commonly known as:  TUMS - dosed in mg elemental calcium     CENTRUM tablet     Cinnamon Bark Powd     FIBER CHOICE PO     fish oil-omega-3  fatty acids 1000 MG capsule     OVER THE COUNTER MEDICATION     oxyCODONE-acetaminophen 10-325 MG per tablet  Commonly known as:  PERCOCET     SYSTANE 0.4-0.3 % Soln  Generic drug:  Polyethyl Glycol-Propyl Glycol     Vitamin D (Ergocalciferol) 50000 UNITS Caps capsule  Commonly known as:  DRISDOL      TAKE these medications       acetaminophen 325 MG tablet  Commonly known as:  TYLENOL  Take 2 tablets (650 mg total) by mouth every 6 (six) hours as needed for mild pain (or Fever >/= 101).     ALPRAZolam 0.25 MG tablet  Commonly known as:  XANAX  Take 0.25-0.5 mg by mouth at bedtime as needed for sleep.     bisacodyl 10 MG suppository  Commonly known as:  DULCOLAX  Place 1 suppository (10 mg total) rectally daily as needed  for mild constipation or moderate constipation.     cyclobenzaprine 10 MG tablet  Commonly known as:  FLEXERIL  Take 1 tablet (10 mg total) by mouth 3 (three) times daily as needed for muscle spasms.     DSS 100 MG Caps  Take 100 mg by mouth 2 (two) times daily.     ezetimibe-simvastatin 10-40 MG per tablet  Commonly known as:  VYTORIN  Take 0.5 tablets by mouth at bedtime.     gabapentin 300 MG capsule  Commonly known as:  NEURONTIN  Take 300 mg by mouth 4 (four) times daily.     GENTEAL OP  Place 1 drop into both eyes at bedtime.     HYDROmorphone 2 MG tablet  Commonly known as:  DILAUDID  Take 1 tablet (2 mg total) by mouth every 4 (four) hours as needed for moderate pain or severe pain.     losartan 50 MG tablet  Commonly known as:  COZAAR  Take 50 mg by mouth daily with supper.     metFORMIN 500 MG tablet  Commonly known as:  GLUCOPHAGE  Take 500 mg by mouth daily with breakfast.     metoCLOPramide 5 MG tablet  Commonly known as:  REGLAN  Take 1-2 tablets (5-10 mg total) by mouth every 8 (eight) hours as needed for nausea (if ondansetron (ZOFRAN) ineffective.).     ondansetron 4 MG tablet  Commonly known as:  ZOFRAN  Take 1 tablet (4 mg total) by mouth every 6 (six) hours as needed for nausea.     polyethylene glycol packet  Commonly known as:  MIRALAX / GLYCOLAX  Take 17 g by mouth daily as needed for mild constipation.     rivaroxaban 10 MG Tabs tablet  Commonly known as:  XARELTO  - Take 1 tablet (10 mg total) by mouth daily with breakfast. Take Xarelto for two and a half more weeks, then discontinue Xarelto.  - Once the patient has completed the Xarelto, they may resume the 81 mg Aspirin.     senna 8.6 MG Tabs tablet  Commonly known as:  SENOKOT  Take 1 tablet by mouth daily as needed for mild constipation.     SIMBRINZA OP  Place 1 drop into the right eye 3 (three) times daily.     timolol 0.5 % ophthalmic solution  Commonly known as:  BETIMOL    Place 1 drop into the right eye daily.     traMADol 50 MG tablet  Commonly known as:  ULTRAM  Take 1-2 tablets (50-100 mg total) by mouth  every 6 (six) hours as needed (mild pain).     zolpidem 10 MG tablet  Commonly known as:  AMBIEN  Take 10 mg by mouth at bedtime as needed for sleep.       Follow-up Information   Follow up with Gearlean Alf, MD On 03/05/2014. (Call office for appointment on Tuesday 03/05/2014 for follow up.)    Specialty:  Orthopedic Surgery   Contact information:   [address] [place] [address] [phone]       Signed: Arlee Muslim, PA-C Orthopaedic Surgery 02/25/2014, 7:14 AM

## 2014-02-24 NOTE — Progress Notes (Signed)
Physical Therapy Treatment Patient Details Name: Marissa Ryan MRN: [id number] DOB: 26-Jun-1925 Today's Date: 02/24/2014    History of Present Illness L THR - ant dir    PT Comments    POD # 4 am session.  Pt demon less confusion.  Assisted OOB to amb still required + 2 assit for safety.  Limited amb distance due to pt's request.  Pt progressing slowly and plans to D/C to SNF.  Follow Up Recommendations  SNF ([facility])     Equipment Recommendations       Recommendations for Other Services       Precautions / Restrictions Precautions Precautions: Fall Restrictions Weight Bearing Restrictions: No Other Position/Activity Restrictions: WBAT    Mobility  Bed Mobility Overal bed mobility: Needs Assistance Bed Mobility: Supine to Sit     Supine to sit: Min assist     General bed mobility comments: increased time and assist for L LE  Transfers Overall transfer level: Needs assistance Equipment used: Rolling walker (2 wheeled) Transfers: Sit to/from Stand Sit to Stand: Mod assist         General transfer comment: multimodal cues for hand/leg position  Ambulation/Gait Ambulation/Gait assistance: +2 safety/equipment;Mod assist Ambulation Distance (Feet): 22 Feet Assistive device: Rolling walker (2 wheeled) Gait Pattern/deviations: Step-to pattern;Decreased stance time - left;Trunk flexed Gait velocity: decreased   General Gait Details: cues for posture, sequence and position from RW.  MAX encouragement to increase amb distance.     Stairs            Wheelchair Mobility    Modified Rankin (Stroke Patients Only)       Balance                                    Cognition                            Exercises      General Comments        Pertinent Vitals/Pain      Home Living                      Prior Function            PT Goals (current goals can now be found in the care plan section) Progress  towards PT goals: Progressing toward goals    Frequency       PT Plan      Co-evaluation             End of Session Equipment Utilized During Treatment: Gait belt Activity Tolerance: Patient limited by fatigue;Patient limited by pain Patient left: in chair;with call bell/phone within reach     Time: 0813-0847 PT Time Calculation (min): 34 min  Charges:  $Gait Training: 8-22 mins $Therapeutic Activity: 8-22 mins                    G Codes:      Rica Koyanagi  PTA [facility]  Acute  Rehab Pager      [phone]

## 2014-02-24 NOTE — Progress Notes (Signed)
Physical Therapy Treatment Patient Details Name: DAUNA ZISKA MRN: [id number] DOB: Dec 30, 1924 Today's Date: 02/24/2014    History of Present Illness L THR - ant dir    PT Comments    POD # 4 pm session.  Pt just got back to bed from [facility] with NT so performed THR TE's followed by ICE.  Follow Up Recommendations  SNF     Equipment Recommendations       Recommendations for Other Services       Precautions / Restrictions Precautions Precautions: Fall Restrictions Weight Bearing Restrictions: No Other Position/Activity Restrictions: WBAT    Mobility  Tx session focused on TE's.  Stairs            Wheelchair Mobility    Modified Rankin (Stroke Patients Only)       Balance                                    Cognition                            Exercises   Total Hip Replacement TE's 10 reps ankle pumps 10 reps knee presses 10 reps heel slides 10 reps SAQ's 10 reps ABD Followed by ICE     General Comments        Pertinent Vitals/Pain      Home Living                      Prior Function            PT Goals (current goals can now be found in the care plan section) Progress towards PT goals: Progressing toward goals    Frequency  7X/week    PT Plan      Co-evaluation             End of Session Equipment Utilized During Treatment: Gait belt Activity Tolerance: Patient limited by fatigue;Patient limited by pain Patient left: in chair;with call bell/phone within reach     Time: 1415-1428 PT Time Calculation (min): 13 min  Charges:   $Therapeutic Exercise: 8-22 mins                     G Codes:      Rica Koyanagi  PTA [facility]  Acute  Rehab Pager      [phone]

## 2014-02-24 NOTE — Progress Notes (Signed)
   Subjective: 4 Days Post-Op Procedure(s) (LRB): LEFT TOTAL HIP ARTHROPLASTY ANTERIOR APPROACH (Left) Patient reports pain as mild.   Patient seen in rounds with Dr. Wynelle Link. Patient is well, and has had no acute complaints or problems Continue therapy Plan is to go Skilled nursing facility after hospital stay.  Objective: Vital signs in last 24 hours: Temp:  [98.1 F (36.7 C)-99.6 F (37.6 C)] 98.9 F (37.2 C) (08/09 0545) Pulse Rate:  [82-88] 82 (08/09 0545) Resp:  [16-18] 16 (08/09 0545) BP: (139-164)/(44-68) 139/68 mmHg (08/09 0545) SpO2:  [97 %-100 %] 97 % (08/09 0545)  Intake/Output from previous day:  Intake/Output Summary (Last 24 hours) at 02/24/14 0758 Last data filed at 02/23/14 2116  Gross per 24 hour  Intake    960 ml  Output   1250 ml  Net   -290 ml    Intake/Output this shift:    Labs:  Recent Labs  02/22/14 0540 02/23/14 0520  HGB 11.4* 10.5*    Recent Labs  02/22/14 0540 02/23/14 0520  WBC 17.2* 16.1*  RBC 3.64* 3.40*  HCT 35.1* 32.5*  PLT 264 260    Recent Labs  02/22/14 0540  NA 140  K 4.0  CL 104  CO2 25  BUN 11  CREATININE 0.67  GLUCOSE 169*  CALCIUM 9.2   No results found for this basename: LABPT, INR,  in the last 72 hours  EXAM General - Patient is Alert, Appropriate and Oriented Extremity - Neurovascular intact Sensation intact distally Dressing - dressing C/D/I Motor Function - intact, moving foot and toes well on exam.   Past Medical History  Diagnosis Date  . Osteoporosis   . Hypertension   . Arthritis   . Gallstones   . Hyperlipidemia   . IBS (irritable bowel syndrome)   . Diabetes mellitus without complication   . Cancer     hx of skin cancer removal on hand     Assessment/Plan: 4 Days Post-Op Procedure(s) (LRB): LEFT TOTAL HIP ARTHROPLASTY ANTERIOR APPROACH (Left) Principal Problem:   OA (osteoarthritis) of hip  Estimated body mass index is 27.79 kg/(m^2) as calculated from the following:  Height as of this encounter: 5\' 2"  (1.575 m).   Weight as of this encounter: 68.947 kg (152 lb). Up with therapy Discharge to SNF  DVT Prophylaxis - Xarelto Weight Bearing As Tolerated left Leg Plan is now for SNF Social worker is looking into beds  Arlee Muslim, PA-C Orthopaedic Surgery 02/24/2014, 7:58 AM

## 2014-02-25 DIAGNOSIS — E119 Type 2 diabetes mellitus without complications: Secondary | ICD-10-CM | POA: Diagnosis not present

## 2014-02-25 DIAGNOSIS — M810 Age-related osteoporosis without current pathological fracture: Secondary | ICD-10-CM | POA: Diagnosis not present

## 2014-02-25 DIAGNOSIS — R509 Fever, unspecified: Secondary | ICD-10-CM | POA: Diagnosis not present

## 2014-02-25 DIAGNOSIS — S79919A Unspecified injury of unspecified hip, initial encounter: Secondary | ICD-10-CM | POA: Diagnosis not present

## 2014-02-25 DIAGNOSIS — K8020 Calculus of gallbladder without cholecystitis without obstruction: Secondary | ICD-10-CM | POA: Diagnosis not present

## 2014-02-25 DIAGNOSIS — Z96649 Presence of unspecified artificial hip joint: Secondary | ICD-10-CM | POA: Diagnosis not present

## 2014-02-25 DIAGNOSIS — M129 Arthropathy, unspecified: Secondary | ICD-10-CM | POA: Diagnosis not present

## 2014-02-25 DIAGNOSIS — E785 Hyperlipidemia, unspecified: Secondary | ICD-10-CM | POA: Diagnosis not present

## 2014-02-25 DIAGNOSIS — Z5189 Encounter for other specified aftercare: Secondary | ICD-10-CM | POA: Diagnosis not present

## 2014-02-25 DIAGNOSIS — M25559 Pain in unspecified hip: Secondary | ICD-10-CM | POA: Diagnosis not present

## 2014-02-25 DIAGNOSIS — K5900 Constipation, unspecified: Secondary | ICD-10-CM | POA: Diagnosis not present

## 2014-02-25 DIAGNOSIS — Z471 Aftercare following joint replacement surgery: Secondary | ICD-10-CM | POA: Diagnosis not present

## 2014-02-25 DIAGNOSIS — M545 Low back pain, unspecified: Secondary | ICD-10-CM | POA: Diagnosis not present

## 2014-02-25 DIAGNOSIS — I10 Essential (primary) hypertension: Secondary | ICD-10-CM | POA: Diagnosis not present

## 2014-02-25 DIAGNOSIS — K589 Irritable bowel syndrome without diarrhea: Secondary | ICD-10-CM | POA: Diagnosis not present

## 2014-02-25 LAB — GLUCOSE, CAPILLARY: GLUCOSE-CAPILLARY: 159 mg/dL — AB (ref 70–99)

## 2014-02-25 MED ORDER — ONDANSETRON HCL 4 MG PO TABS: 4.0000 mg | ORAL_TABLET | Freq: Four times a day (QID) | ORAL | Status: DC | PRN

## 2014-02-25 MED ORDER — TRAMADOL HCL 50 MG PO TABS: 50.0000 mg | ORAL_TABLET | Freq: Four times a day (QID) | ORAL | Status: DC | PRN

## 2014-02-25 MED ORDER — HYDROMORPHONE HCL 2 MG PO TABS: 2.0000 mg | ORAL_TABLET | ORAL | Status: DC | PRN

## 2014-02-25 MED ORDER — ACETAMINOPHEN 325 MG PO TABS: 650.0000 mg | ORAL_TABLET | Freq: Four times a day (QID) | ORAL | Status: DC | PRN

## 2014-02-25 MED ORDER — DSS 100 MG PO CAPS: 100.0000 mg | ORAL_CAPSULE | Freq: Two times a day (BID) | ORAL | Status: AC

## 2014-02-25 MED ORDER — BISACODYL 10 MG RE SUPP: 10.0000 mg | Freq: Every day | RECTAL | Status: DC | PRN

## 2014-02-25 MED ORDER — POLYETHYLENE GLYCOL 3350 17 G PO PACK: 17.0000 g | PACK | Freq: Every day | ORAL | Status: DC | PRN

## 2014-02-25 MED ORDER — METOCLOPRAMIDE HCL 5 MG PO TABS: 5.0000 mg | ORAL_TABLET | Freq: Three times a day (TID) | ORAL | Status: DC | PRN

## 2014-02-25 MED ORDER — CYCLOBENZAPRINE HCL 10 MG PO TABS: 10.0000 mg | ORAL_TABLET | Freq: Three times a day (TID) | ORAL | Status: DC | PRN

## 2014-02-25 MED ORDER — RIVAROXABAN 10 MG PO TABS: 10.0000 mg | ORAL_TABLET | Freq: Every day | ORAL | Status: DC

## 2014-02-25 NOTE — Discharge Instructions (Signed)
°Dr. Frank [name] °Total Joint Specialist °[facility] °[address] °[place], [place] [address] °[phone] ° ° ° °ANTERIOR APPROACH TOTAL HIP REPLACEMENT POSTOPERATIVE DIRECTIONS ° ° °Hip Rehabilitation, Guidelines Following Surgery  °The results of a hip operation are greatly improved after range of motion and muscle strengthening exercises. Follow all safety measures which are given to protect your hip. If any of these exercises cause increased pain or swelling in your joint, decrease the amount until you are comfortable again. Then slowly increase the exercises. Call your caregiver if you have problems or questions.  °HOME CARE INSTRUCTIONS  °Most of the following instructions are designed to prevent the dislocation of your new hip.  °Remove items at home which could result in a fall. This includes throw rugs or furniture in walking pathways.  °Continue medications as instructed at time of discharge. °· You may have some home medications which will be placed on hold until you complete the course of blood thinner medication. °· You may start showering once you are discharged home but do not submerge the incision under water. Just pat the incision dry and apply a dry gauze dressing on daily. °Do not put on socks or shoes without following the instructions of your caregivers.  °Sit on high chairs which makes it easier to stand.  °Sit on chairs with arms. Use the chair arms to help push yourself up when arising.  °Keep your leg on the side of the operation out in front of you when standing up.  °Arrange for the use of a toilet seat elevator so you are not sitting low.   °· Walk with walker as instructed.  °You may resume a sexual relationship in one month or when given the OK by your caregiver.  °Use walker as long as suggested by your caregivers.  °You may put full weight on your legs and walk as much as is comfortable. °Avoid periods of inactivity such as sitting longer than an hour  when not asleep. This helps prevent blood clots.  °You may return to work once you are cleared by your surgeon.  °Do not drive a car for 6 weeks or until released by your surgeon.  °Do not drive while taking narcotics.  °Wear elastic stockings for three weeks following surgery during the day but you may remove then at night.  °Make sure you keep all of your appointments after your operation with all of your doctors and caregivers. You should call the office at the above phone number and make an appointment for approximately two weeks after the date of your surgery. °Change the dressing daily and reapply a dry dressing each time. °Please pick up a stool softener and laxative for home use as long as you are requiring pain medications. °· Continue to use ice on the hip for pain and swelling from surgery. You may notice swelling that will progress down to the foot and ankle.  This is normal after  surgery.  Elevate the leg when you are not up walking on it.   °It is important for you to complete the blood thinner medication as prescribed by your doctor. °· Continue to use the breathing machine which will help keep your temperature down.  It is common for your temperature to cycle up and down following surgery, especially at night when you are not up moving around and exerting yourself.  The breathing machine keeps your lungs expanded and your temperature down. ° °RANGE OF MOTION AND STRENGTHENING EXERCISES  °  These exercises are designed to help you keep full movement of your hip joint. Follow your caregiver's or physical therapist's instructions. Perform all exercises about fifteen times, three times per day or as directed. Exercise both hips, even if you have had only one joint replacement. These exercises can be done on a training (exercise) mat, on the floor, on a table or on a bed. Use whatever works the best and is most comfortable for you. Use music or television while you are exercising so that the exercises are  a pleasant break in your day. This will make your life better with the exercises acting as a break in routine you can look forward to.  Lying on your back, slowly slide your foot toward your buttocks, raising your knee up off the floor. Then slowly slide your foot back down until your leg is straight again.  Lying on your back spread your legs as far apart as you can without causing discomfort.  Lying on your side, raise your upper leg and foot straight up from the floor as far as is comfortable. Slowly lower the leg and repeat.  Lying on your back, tighten up the muscle in the front of your thigh (quadriceps muscles). You can do this by keeping your leg straight and trying to raise your heel off the floor. This helps strengthen the largest muscle supporting your knee.  Lying on your back, tighten up the muscles of your buttocks both with the legs straight and with the knee bent at a comfortable angle while keeping your heel on the floor.   SKILLED REHAB INSTRUCTIONS: If the patient is transferred to a skilled rehab facility following release from the hospital, a list of the current medications will be sent to the facility for the patient to continue.  When discharged from the skilled rehab facility, please have the facility set up the patient's [place] prior to being released. Also, the skilled facility will be responsible for providing the patient with their medications at time of release from the facility to include their pain medication, the muscle relaxants, and their blood thinner medication. If the patient is still at the rehab facility at time of the two week follow up appointment, the skilled rehab facility will also need to assist the patient in arranging follow up appointment in our office and any transportation needs.  MAKE SURE YOU:  Understand these instructions.  Will watch your condition.  Will get help right away if you are not doing well or get worse.  Pick up  stool softner and laxative for home. Do not submerge incision under water. May shower. Continue to use ice for pain and swelling from surgery. Total Hip Protocol.  Take Xarelto for two and a half more weeks, then discontinue Xarelto. Once the patient has completed the Xarelto, they may resume the 81 mg Aspirin.  When discharged from the skilled rehab facility, please have the facility set up the patient's [place] prior to being released.  Also provide the patient with their medications at time of release from the facility to include their pain medication, the muscle relaxants, and their blood thinner medication.  If the patient is still at the rehab facility at time of follow up appointment, please also assist the patient in arranging follow up appointment in our office and any transportation needs.

## 2014-02-25 NOTE — Progress Notes (Signed)
Clinical Social Work Department CLINICAL SOCIAL WORK PLACEMENT NOTE 02/25/2014  Patient:  Marissa Ryan, Marissa Ryan  Account Number:  [id number] Admit date:  02/20/2014  Clinical Social Worker:  Lovey Newcomer  Date/time:  02/23/2014 10:31 AM  Clinical Social Work is seeking post-discharge placement for this patient at the following level of care:   [place]   (*CSW will update this form in Epic as items are completed)   02/23/2014  Patient/family provided with [place] Department of Clinical Social Work's list of facilities offering this level of care within the geographic area requested by the patient (or if unable, by the patient's family).  02/23/2014  Patient/family informed of their freedom to choose among providers that offer the needed level of care, that participate in Medicare, Medicaid or managed care program needed by the patient, have an available bed and are willing to accept the patient.  02/23/2014  Patient/family informed of [facility]' ownership interest in [facility], as well as of the fact that they are under no obligation to receive care at this facility.  PASARR submitted to EDS on 02/23/2014 PASARR number received on 02/23/2014  FL2 transmitted to all facilities in geographic area requested by pt/family on  02/23/2014 FL2 transmitted to all facilities within larger geographic area on   Patient informed that his/her managed care company has contracts with or will negotiate with  certain facilities, including the following:     Patient/family informed of bed offers received:  02/25/2014 Patient chooses bed at [facility] [place] [facility] Physician recommends and patient chooses bed at    Patient to be transferred to [facility] on  02/25/2014 Patient to be transferred to facility by [facility] Patient and family notified of transfer on 02/25/2014 Name of family member notified:  SPOUSE  The following  physician request were entered in Epic:   Additional Comments: Pt / spouse are in agreement with d/c to SNF today via [facility] transport. PT consulted and felt non emergency transport was needed. NSG reviewed d/c summary, scripts, avs. Scripts are included in d/c packet.  Werner Lean LCSW [phone]

## 2014-02-25 NOTE — Progress Notes (Signed)
   Subjective: 5 Days Post-Op Procedure(s) (LRB): LEFT TOTAL HIP ARTHROPLASTY ANTERIOR APPROACH (Left) Patient reports pain as mild.   Patient seen in rounds with Dr. Wynelle Link. Husband in the room. Patient is well, but has had some minor complaints of pain in the hip, requiring pain medications Patient is ready to go to the SNF today.  Objective: Vital signs in last 24 hours: Temp:  [98.5 F (36.9 C)-100 F (37.8 C)] 98.5 F (36.9 C) (08/10 0620) Pulse Rate:  [84-101] 101 (08/10 0612) Resp:  [16-18] 16 (08/10 0612) BP: (138-158)/(53-66) 158/66 mmHg (08/10 0612) SpO2:  [96 %-98 %] 98 % (08/10 0612)  Intake/Output from previous day:  Intake/Output Summary (Last 24 hours) at 02/25/14 0705 Last data filed at 02/25/14 7062  Gross per 24 hour  Intake   1020 ml  Output   1275 ml  Net   -255 ml    Intake/Output this shift:    Labs:  Recent Labs  02/23/14 0520  HGB 10.5*    Recent Labs  02/23/14 0520  WBC 16.1*  RBC 3.40*  HCT 32.5*  PLT 260   No results found for this basename: NA, K, CL, CO2, BUN, CREATININE, GLUCOSE, CALCIUM,  in the last 72 hours No results found for this basename: LABPT, INR,  in the last 72 hours  EXAM: General - Patient is Alert, Appropriate and Oriented Extremity - Neurovascular intact Sensation intact distally Dorsiflexion/Plantar flexion intact Incision - clean, dry, no drainage Motor Function - intact, moving foot and toes well on exam.   Assessment/Plan: 5 Days Post-Op Procedure(s) (LRB): LEFT TOTAL HIP ARTHROPLASTY ANTERIOR APPROACH (Left) Procedure(s) (LRB): LEFT TOTAL HIP ARTHROPLASTY ANTERIOR APPROACH (Left) Past Medical History  Diagnosis Date  . Osteoporosis   . Hypertension   . Arthritis   . Gallstones   . Hyperlipidemia   . IBS (irritable bowel syndrome)   . Diabetes mellitus without complication   . Cancer     hx of skin cancer removal on hand    Principal Problem:   OA (osteoarthritis) of hip  Estimated body  mass index is 27.79 kg/(m^2) as calculated from the following:   Height as of this encounter: 5\' 2"  (1.575 m).   Weight as of this encounter: 68.947 kg (152 lb). Up with therapy Discharge to SNF Diet - Cardiac diet and Diabetic diet Follow up - in 1 more week after discharge on Tuesday 03/05/2014 Activity - WBAT Disposition - Skilled nursing facility Condition Upon Discharge - Good D/C Meds - See DC Summary DVT Prophylaxis - Xarelto  Arlee Muslim, PA-C Orthopaedic Surgery 02/25/2014, 7:05 AM

## 2014-02-25 NOTE — Progress Notes (Signed)
Physical Therapy Treatment Patient Details Name: Marissa Ryan MRN: [id number] DOB: [birth year] Today's Date: 03-07-2014    History of Present Illness L THR - ant dir    PT Comments    Pt assisted to [facility] however unable to void so ambulated in hallway and then assisted back to bed.  Pt too fatigued to perform exercises after ambulation, and pt to d/c to SNF today.  Follow Up Recommendations  SNF     Equipment Recommendations  None recommended by PT    Recommendations for Other Services       Precautions / Restrictions Precautions Precautions: Fall Restrictions Weight Bearing Restrictions: No Other Position/Activity Restrictions: WBAT    Mobility  Bed Mobility Overal bed mobility: Needs Assistance Bed Mobility: Supine to Sit;Sit to Supine     Supine to sit: Supervision Sit to supine: Min assist   General bed mobility comments: assist for L LE  Transfers Overall transfer level: Needs assistance Equipment used: Rolling walker (2 wheeled) Transfers: Sit to/from [facility] Sit to Stand: Min guard Stand pivot transfers: Min guard       General transfer comment: verbal cues for safe technique  Ambulation/Gait Ambulation/Gait assistance: Min guard Ambulation Distance (Feet): 50 Feet Assistive device: Rolling walker (2 wheeled) Gait Pattern/deviations: Step-to pattern;Decreased stance time - left Gait velocity: decreased   General Gait Details: verbal cues for RW positioning, distance per pt comfort   Stairs            Wheelchair Mobility    Modified Rankin (Stroke Patients Only)       Balance                                    Cognition Arousal/Alertness: Awake/alert Behavior During Therapy: WFL for tasks assessed/performed Overall Cognitive Status: Impaired/Different from baseline Area of Impairment: Following commands;Safety/judgement     Memory: Decreased short-term memory Following Commands: Follows one  step commands with increased time Safety/Judgement: Decreased awareness of safety     General Comments: attempted to mobilize without RW, had to discuss reasoning for using RW    Exercises      General Comments        Pertinent Vitals/Pain Pain Assessment: 0-10 Pain Score: 2  Pain Location: L hip Pain Descriptors / Indicators: Sore Pain Intervention(s): Limited activity within patient's tolerance;Repositioned;Ice applied    Home Living                      Prior Function            PT Goals (current goals can now be found in the care plan section) Progress towards PT goals: Progressing toward goals    Frequency  7X/week    PT Plan Current plan remains appropriate    Co-evaluation             End of Session   Activity Tolerance: Patient limited by fatigue Patient left: with call bell/phone within reach;in bed     Time: 5885-0277 PT Time Calculation (min): 26 min  Charges:  $Gait Training: 23-37 mins                    G Codes:      Ayline Dingus,KATHrine E 03/07/14, 1:26 PM Carmelia Bake, PT, DPT Mar 07, 2014 Pager: [phone]

## 2014-02-25 NOTE — Progress Notes (Signed)
Pt to d/c to [place]. Report given to nurse at facility. Transported to facility by [facility]

## 2014-02-26 DIAGNOSIS — R509 Fever, unspecified: Secondary | ICD-10-CM | POA: Diagnosis not present

## 2014-02-26 DIAGNOSIS — E119 Type 2 diabetes mellitus without complications: Secondary | ICD-10-CM | POA: Diagnosis not present

## 2014-02-26 DIAGNOSIS — K5900 Constipation, unspecified: Secondary | ICD-10-CM | POA: Diagnosis not present

## 2014-02-26 DIAGNOSIS — I10 Essential (primary) hypertension: Secondary | ICD-10-CM | POA: Diagnosis not present

## 2014-02-26 DIAGNOSIS — M545 Low back pain, unspecified: Secondary | ICD-10-CM | POA: Diagnosis not present

## 2014-03-08 DIAGNOSIS — M1710 Unilateral primary osteoarthritis, unspecified knee: Secondary | ICD-10-CM | POA: Diagnosis not present

## 2014-03-08 DIAGNOSIS — Z471 Aftercare following joint replacement surgery: Secondary | ICD-10-CM | POA: Diagnosis not present

## 2014-03-08 DIAGNOSIS — I10 Essential (primary) hypertension: Secondary | ICD-10-CM | POA: Diagnosis not present

## 2014-03-08 DIAGNOSIS — IMO0002 Reserved for concepts with insufficient information to code with codable children: Secondary | ICD-10-CM | POA: Diagnosis not present

## 2014-03-08 DIAGNOSIS — E119 Type 2 diabetes mellitus without complications: Secondary | ICD-10-CM | POA: Diagnosis not present

## 2014-03-08 DIAGNOSIS — R269 Unspecified abnormalities of gait and mobility: Secondary | ICD-10-CM | POA: Diagnosis not present

## 2014-03-08 DIAGNOSIS — G894 Chronic pain syndrome: Secondary | ICD-10-CM | POA: Diagnosis not present

## 2014-03-09 DIAGNOSIS — M1710 Unilateral primary osteoarthritis, unspecified knee: Secondary | ICD-10-CM | POA: Diagnosis not present

## 2014-03-09 DIAGNOSIS — E119 Type 2 diabetes mellitus without complications: Secondary | ICD-10-CM | POA: Diagnosis not present

## 2014-03-09 DIAGNOSIS — G894 Chronic pain syndrome: Secondary | ICD-10-CM | POA: Diagnosis not present

## 2014-03-09 DIAGNOSIS — R269 Unspecified abnormalities of gait and mobility: Secondary | ICD-10-CM | POA: Diagnosis not present

## 2014-03-09 DIAGNOSIS — Z471 Aftercare following joint replacement surgery: Secondary | ICD-10-CM | POA: Diagnosis not present

## 2014-03-09 DIAGNOSIS — IMO0002 Reserved for concepts with insufficient information to code with codable children: Secondary | ICD-10-CM | POA: Diagnosis not present

## 2014-03-09 DIAGNOSIS — I10 Essential (primary) hypertension: Secondary | ICD-10-CM | POA: Diagnosis not present

## 2014-03-11 DIAGNOSIS — G894 Chronic pain syndrome: Secondary | ICD-10-CM | POA: Diagnosis not present

## 2014-03-11 DIAGNOSIS — E119 Type 2 diabetes mellitus without complications: Secondary | ICD-10-CM | POA: Diagnosis not present

## 2014-03-11 DIAGNOSIS — M1710 Unilateral primary osteoarthritis, unspecified knee: Secondary | ICD-10-CM | POA: Diagnosis not present

## 2014-03-11 DIAGNOSIS — R269 Unspecified abnormalities of gait and mobility: Secondary | ICD-10-CM | POA: Diagnosis not present

## 2014-03-11 DIAGNOSIS — I10 Essential (primary) hypertension: Secondary | ICD-10-CM | POA: Diagnosis not present

## 2014-03-11 DIAGNOSIS — Z471 Aftercare following joint replacement surgery: Secondary | ICD-10-CM | POA: Diagnosis not present

## 2014-03-11 DIAGNOSIS — IMO0002 Reserved for concepts with insufficient information to code with codable children: Secondary | ICD-10-CM | POA: Diagnosis not present

## 2014-03-12 DIAGNOSIS — M1710 Unilateral primary osteoarthritis, unspecified knee: Secondary | ICD-10-CM | POA: Diagnosis not present

## 2014-03-12 DIAGNOSIS — G894 Chronic pain syndrome: Secondary | ICD-10-CM | POA: Diagnosis not present

## 2014-03-12 DIAGNOSIS — R269 Unspecified abnormalities of gait and mobility: Secondary | ICD-10-CM | POA: Diagnosis not present

## 2014-03-12 DIAGNOSIS — Z471 Aftercare following joint replacement surgery: Secondary | ICD-10-CM | POA: Diagnosis not present

## 2014-03-12 DIAGNOSIS — I10 Essential (primary) hypertension: Secondary | ICD-10-CM | POA: Diagnosis not present

## 2014-03-12 DIAGNOSIS — IMO0002 Reserved for concepts with insufficient information to code with codable children: Secondary | ICD-10-CM | POA: Diagnosis not present

## 2014-03-12 DIAGNOSIS — E119 Type 2 diabetes mellitus without complications: Secondary | ICD-10-CM | POA: Diagnosis not present

## 2014-03-13 DIAGNOSIS — M1710 Unilateral primary osteoarthritis, unspecified knee: Secondary | ICD-10-CM | POA: Diagnosis not present

## 2014-03-13 DIAGNOSIS — IMO0002 Reserved for concepts with insufficient information to code with codable children: Secondary | ICD-10-CM | POA: Diagnosis not present

## 2014-03-13 DIAGNOSIS — I10 Essential (primary) hypertension: Secondary | ICD-10-CM | POA: Diagnosis not present

## 2014-03-13 DIAGNOSIS — R269 Unspecified abnormalities of gait and mobility: Secondary | ICD-10-CM | POA: Diagnosis not present

## 2014-03-13 DIAGNOSIS — G894 Chronic pain syndrome: Secondary | ICD-10-CM | POA: Diagnosis not present

## 2014-03-13 DIAGNOSIS — Z471 Aftercare following joint replacement surgery: Secondary | ICD-10-CM | POA: Diagnosis not present

## 2014-03-13 DIAGNOSIS — E119 Type 2 diabetes mellitus without complications: Secondary | ICD-10-CM | POA: Diagnosis not present

## 2014-03-14 DIAGNOSIS — G894 Chronic pain syndrome: Secondary | ICD-10-CM | POA: Diagnosis not present

## 2014-03-14 DIAGNOSIS — IMO0002 Reserved for concepts with insufficient information to code with codable children: Secondary | ICD-10-CM | POA: Diagnosis not present

## 2014-03-14 DIAGNOSIS — M1710 Unilateral primary osteoarthritis, unspecified knee: Secondary | ICD-10-CM | POA: Diagnosis not present

## 2014-03-14 DIAGNOSIS — E119 Type 2 diabetes mellitus without complications: Secondary | ICD-10-CM | POA: Diagnosis not present

## 2014-03-14 DIAGNOSIS — R269 Unspecified abnormalities of gait and mobility: Secondary | ICD-10-CM | POA: Diagnosis not present

## 2014-03-14 DIAGNOSIS — Z471 Aftercare following joint replacement surgery: Secondary | ICD-10-CM | POA: Diagnosis not present

## 2014-03-14 DIAGNOSIS — I10 Essential (primary) hypertension: Secondary | ICD-10-CM | POA: Diagnosis not present

## 2014-03-15 DIAGNOSIS — R269 Unspecified abnormalities of gait and mobility: Secondary | ICD-10-CM | POA: Diagnosis not present

## 2014-03-15 DIAGNOSIS — M1710 Unilateral primary osteoarthritis, unspecified knee: Secondary | ICD-10-CM | POA: Diagnosis not present

## 2014-03-15 DIAGNOSIS — E119 Type 2 diabetes mellitus without complications: Secondary | ICD-10-CM | POA: Diagnosis not present

## 2014-03-15 DIAGNOSIS — Z471 Aftercare following joint replacement surgery: Secondary | ICD-10-CM | POA: Diagnosis not present

## 2014-03-15 DIAGNOSIS — G894 Chronic pain syndrome: Secondary | ICD-10-CM | POA: Diagnosis not present

## 2014-03-15 DIAGNOSIS — IMO0002 Reserved for concepts with insufficient information to code with codable children: Secondary | ICD-10-CM | POA: Diagnosis not present

## 2014-03-15 DIAGNOSIS — I10 Essential (primary) hypertension: Secondary | ICD-10-CM | POA: Diagnosis not present

## 2014-03-18 DIAGNOSIS — M1710 Unilateral primary osteoarthritis, unspecified knee: Secondary | ICD-10-CM | POA: Diagnosis not present

## 2014-03-18 DIAGNOSIS — G894 Chronic pain syndrome: Secondary | ICD-10-CM | POA: Diagnosis not present

## 2014-03-18 DIAGNOSIS — IMO0002 Reserved for concepts with insufficient information to code with codable children: Secondary | ICD-10-CM | POA: Diagnosis not present

## 2014-03-18 DIAGNOSIS — I10 Essential (primary) hypertension: Secondary | ICD-10-CM | POA: Diagnosis not present

## 2014-03-18 DIAGNOSIS — R269 Unspecified abnormalities of gait and mobility: Secondary | ICD-10-CM | POA: Diagnosis not present

## 2014-03-18 DIAGNOSIS — E119 Type 2 diabetes mellitus without complications: Secondary | ICD-10-CM | POA: Diagnosis not present

## 2014-03-18 DIAGNOSIS — Z471 Aftercare following joint replacement surgery: Secondary | ICD-10-CM | POA: Diagnosis not present

## 2014-03-19 DIAGNOSIS — Z471 Aftercare following joint replacement surgery: Secondary | ICD-10-CM | POA: Diagnosis not present

## 2014-03-19 DIAGNOSIS — E119 Type 2 diabetes mellitus without complications: Secondary | ICD-10-CM | POA: Diagnosis not present

## 2014-03-19 DIAGNOSIS — G894 Chronic pain syndrome: Secondary | ICD-10-CM | POA: Diagnosis not present

## 2014-03-19 DIAGNOSIS — M1710 Unilateral primary osteoarthritis, unspecified knee: Secondary | ICD-10-CM | POA: Diagnosis not present

## 2014-03-19 DIAGNOSIS — I10 Essential (primary) hypertension: Secondary | ICD-10-CM | POA: Diagnosis not present

## 2014-03-19 DIAGNOSIS — IMO0002 Reserved for concepts with insufficient information to code with codable children: Secondary | ICD-10-CM | POA: Diagnosis not present

## 2014-03-19 DIAGNOSIS — R269 Unspecified abnormalities of gait and mobility: Secondary | ICD-10-CM | POA: Diagnosis not present

## 2014-03-20 DIAGNOSIS — I10 Essential (primary) hypertension: Secondary | ICD-10-CM | POA: Diagnosis not present

## 2014-03-20 DIAGNOSIS — R269 Unspecified abnormalities of gait and mobility: Secondary | ICD-10-CM | POA: Diagnosis not present

## 2014-03-20 DIAGNOSIS — Z471 Aftercare following joint replacement surgery: Secondary | ICD-10-CM | POA: Diagnosis not present

## 2014-03-20 DIAGNOSIS — E119 Type 2 diabetes mellitus without complications: Secondary | ICD-10-CM | POA: Diagnosis not present

## 2014-03-20 DIAGNOSIS — M1710 Unilateral primary osteoarthritis, unspecified knee: Secondary | ICD-10-CM | POA: Diagnosis not present

## 2014-03-20 DIAGNOSIS — IMO0002 Reserved for concepts with insufficient information to code with codable children: Secondary | ICD-10-CM | POA: Diagnosis not present

## 2014-03-20 DIAGNOSIS — G894 Chronic pain syndrome: Secondary | ICD-10-CM | POA: Diagnosis not present

## 2014-03-21 DIAGNOSIS — IMO0002 Reserved for concepts with insufficient information to code with codable children: Secondary | ICD-10-CM | POA: Diagnosis not present

## 2014-03-21 DIAGNOSIS — M1710 Unilateral primary osteoarthritis, unspecified knee: Secondary | ICD-10-CM | POA: Diagnosis not present

## 2014-03-21 DIAGNOSIS — Z471 Aftercare following joint replacement surgery: Secondary | ICD-10-CM | POA: Diagnosis not present

## 2014-03-21 DIAGNOSIS — G894 Chronic pain syndrome: Secondary | ICD-10-CM | POA: Diagnosis not present

## 2014-03-21 DIAGNOSIS — E119 Type 2 diabetes mellitus without complications: Secondary | ICD-10-CM | POA: Diagnosis not present

## 2014-03-21 DIAGNOSIS — R269 Unspecified abnormalities of gait and mobility: Secondary | ICD-10-CM | POA: Diagnosis not present

## 2014-03-21 DIAGNOSIS — I10 Essential (primary) hypertension: Secondary | ICD-10-CM | POA: Diagnosis not present

## 2014-03-22 DIAGNOSIS — Z471 Aftercare following joint replacement surgery: Secondary | ICD-10-CM | POA: Diagnosis not present

## 2014-03-22 DIAGNOSIS — I10 Essential (primary) hypertension: Secondary | ICD-10-CM | POA: Diagnosis not present

## 2014-03-22 DIAGNOSIS — IMO0002 Reserved for concepts with insufficient information to code with codable children: Secondary | ICD-10-CM | POA: Diagnosis not present

## 2014-03-22 DIAGNOSIS — G894 Chronic pain syndrome: Secondary | ICD-10-CM | POA: Diagnosis not present

## 2014-03-22 DIAGNOSIS — E119 Type 2 diabetes mellitus without complications: Secondary | ICD-10-CM | POA: Diagnosis not present

## 2014-03-22 DIAGNOSIS — M1710 Unilateral primary osteoarthritis, unspecified knee: Secondary | ICD-10-CM | POA: Diagnosis not present

## 2014-03-22 DIAGNOSIS — R269 Unspecified abnormalities of gait and mobility: Secondary | ICD-10-CM | POA: Diagnosis not present

## 2014-03-26 DIAGNOSIS — Z6825 Body mass index (BMI) 25.0-25.9, adult: Secondary | ICD-10-CM | POA: Diagnosis not present

## 2014-03-26 DIAGNOSIS — M549 Dorsalgia, unspecified: Secondary | ICD-10-CM | POA: Diagnosis not present

## 2014-03-26 DIAGNOSIS — E119 Type 2 diabetes mellitus without complications: Secondary | ICD-10-CM | POA: Diagnosis not present

## 2014-03-26 DIAGNOSIS — M1610 Unilateral primary osteoarthritis, unspecified hip: Secondary | ICD-10-CM | POA: Diagnosis not present

## 2014-03-26 DIAGNOSIS — M169 Osteoarthritis of hip, unspecified: Secondary | ICD-10-CM | POA: Diagnosis not present

## 2014-03-26 DIAGNOSIS — I10 Essential (primary) hypertension: Secondary | ICD-10-CM | POA: Diagnosis not present

## 2014-03-26 DIAGNOSIS — M1710 Unilateral primary osteoarthritis, unspecified knee: Secondary | ICD-10-CM | POA: Diagnosis not present

## 2014-03-29 DIAGNOSIS — M25819 Other specified joint disorders, unspecified shoulder: Secondary | ICD-10-CM | POA: Diagnosis not present

## 2014-04-16 DIAGNOSIS — H04129 Dry eye syndrome of unspecified lacrimal gland: Secondary | ICD-10-CM | POA: Diagnosis not present

## 2014-04-16 DIAGNOSIS — H20029 Recurrent acute iridocyclitis, unspecified eye: Secondary | ICD-10-CM | POA: Diagnosis not present

## 2014-04-16 DIAGNOSIS — H5210 Myopia, unspecified eye: Secondary | ICD-10-CM | POA: Diagnosis not present

## 2014-04-16 DIAGNOSIS — H4011X0 Primary open-angle glaucoma, stage unspecified: Secondary | ICD-10-CM | POA: Diagnosis not present

## 2014-04-16 DIAGNOSIS — H52229 Regular astigmatism, unspecified eye: Secondary | ICD-10-CM | POA: Diagnosis not present

## 2014-04-16 DIAGNOSIS — Z9849 Cataract extraction status, unspecified eye: Secondary | ICD-10-CM | POA: Diagnosis not present

## 2014-04-16 DIAGNOSIS — H409 Unspecified glaucoma: Secondary | ICD-10-CM | POA: Diagnosis not present

## 2014-04-16 DIAGNOSIS — Z961 Presence of intraocular lens: Secondary | ICD-10-CM | POA: Diagnosis not present

## 2014-04-24 DIAGNOSIS — H3530 Unspecified macular degeneration: Secondary | ICD-10-CM | POA: Diagnosis not present

## 2014-04-24 DIAGNOSIS — H4010X3 Unspecified open-angle glaucoma, severe stage: Secondary | ICD-10-CM | POA: Diagnosis not present

## 2014-04-24 DIAGNOSIS — E11329 Type 2 diabetes mellitus with mild nonproliferative diabetic retinopathy without macular edema: Secondary | ICD-10-CM | POA: Diagnosis not present

## 2014-04-24 DIAGNOSIS — Z961 Presence of intraocular lens: Secondary | ICD-10-CM | POA: Diagnosis not present

## 2014-04-29 DIAGNOSIS — H4010X3 Unspecified open-angle glaucoma, severe stage: Secondary | ICD-10-CM | POA: Diagnosis not present

## 2014-04-29 DIAGNOSIS — H0015 Chalazion left lower eyelid: Secondary | ICD-10-CM | POA: Diagnosis not present

## 2014-04-29 DIAGNOSIS — H01009 Unspecified blepharitis unspecified eye, unspecified eyelid: Secondary | ICD-10-CM | POA: Diagnosis not present

## 2014-04-30 DIAGNOSIS — M7551 Bursitis of right shoulder: Secondary | ICD-10-CM | POA: Diagnosis not present

## 2014-04-30 DIAGNOSIS — Z471 Aftercare following joint replacement surgery: Secondary | ICD-10-CM | POA: Diagnosis not present

## 2014-04-30 DIAGNOSIS — Z96642 Presence of left artificial hip joint: Secondary | ICD-10-CM | POA: Diagnosis not present

## 2014-05-02 DIAGNOSIS — Z23 Encounter for immunization: Secondary | ICD-10-CM | POA: Diagnosis not present

## 2014-05-07 DIAGNOSIS — M25511 Pain in right shoulder: Secondary | ICD-10-CM | POA: Diagnosis not present

## 2014-05-16 DIAGNOSIS — I10 Essential (primary) hypertension: Secondary | ICD-10-CM | POA: Diagnosis not present

## 2014-05-16 DIAGNOSIS — E119 Type 2 diabetes mellitus without complications: Secondary | ICD-10-CM | POA: Diagnosis not present

## 2014-05-16 DIAGNOSIS — M1612 Unilateral primary osteoarthritis, left hip: Secondary | ICD-10-CM | POA: Diagnosis not present

## 2014-05-16 DIAGNOSIS — M25511 Pain in right shoulder: Secondary | ICD-10-CM | POA: Diagnosis not present

## 2014-05-16 DIAGNOSIS — Z6825 Body mass index (BMI) 25.0-25.9, adult: Secondary | ICD-10-CM | POA: Diagnosis not present

## 2014-05-30 DIAGNOSIS — H3530 Unspecified macular degeneration: Secondary | ICD-10-CM | POA: Diagnosis not present

## 2014-05-30 DIAGNOSIS — Z79899 Other long term (current) drug therapy: Secondary | ICD-10-CM | POA: Diagnosis not present

## 2014-05-30 DIAGNOSIS — Z880 Allergy status to penicillin: Secondary | ICD-10-CM | POA: Diagnosis not present

## 2014-05-30 DIAGNOSIS — H4010X3 Unspecified open-angle glaucoma, severe stage: Secondary | ICD-10-CM | POA: Diagnosis not present

## 2014-05-30 DIAGNOSIS — Z888 Allergy status to other drugs, medicaments and biological substances status: Secondary | ICD-10-CM | POA: Diagnosis not present

## 2014-05-30 DIAGNOSIS — Z885 Allergy status to narcotic agent status: Secondary | ICD-10-CM | POA: Diagnosis not present

## 2014-05-30 DIAGNOSIS — Z96642 Presence of left artificial hip joint: Secondary | ICD-10-CM | POA: Diagnosis not present

## 2014-05-30 DIAGNOSIS — E785 Hyperlipidemia, unspecified: Secondary | ICD-10-CM | POA: Diagnosis not present

## 2014-05-30 DIAGNOSIS — H5989 Other postprocedural complications and disorders of eye and adnexa, not elsewhere classified: Secondary | ICD-10-CM | POA: Diagnosis not present

## 2014-05-30 DIAGNOSIS — E119 Type 2 diabetes mellitus without complications: Secondary | ICD-10-CM | POA: Diagnosis not present

## 2014-05-30 DIAGNOSIS — T85398A Other mechanical complication of other ocular prosthetic devices, implants and grafts, initial encounter: Secondary | ICD-10-CM | POA: Diagnosis not present

## 2014-05-30 DIAGNOSIS — I10 Essential (primary) hypertension: Secondary | ICD-10-CM | POA: Diagnosis not present

## 2014-05-30 DIAGNOSIS — Z7982 Long term (current) use of aspirin: Secondary | ICD-10-CM | POA: Diagnosis not present

## 2014-05-30 DIAGNOSIS — H4011X3 Primary open-angle glaucoma, severe stage: Secondary | ICD-10-CM | POA: Diagnosis not present

## 2014-06-10 DIAGNOSIS — M7541 Impingement syndrome of right shoulder: Secondary | ICD-10-CM | POA: Diagnosis not present

## 2014-06-10 DIAGNOSIS — M25511 Pain in right shoulder: Secondary | ICD-10-CM | POA: Diagnosis not present

## 2014-07-02 DIAGNOSIS — M25511 Pain in right shoulder: Secondary | ICD-10-CM | POA: Diagnosis not present

## 2014-07-15 DIAGNOSIS — M25511 Pain in right shoulder: Secondary | ICD-10-CM | POA: Diagnosis not present

## 2014-07-15 DIAGNOSIS — M7541 Impingement syndrome of right shoulder: Secondary | ICD-10-CM | POA: Diagnosis not present

## 2014-07-26 DIAGNOSIS — M12811 Other specific arthropathies, not elsewhere classified, right shoulder: Secondary | ICD-10-CM | POA: Diagnosis not present

## 2014-07-30 DIAGNOSIS — M12811 Other specific arthropathies, not elsewhere classified, right shoulder: Secondary | ICD-10-CM | POA: Diagnosis not present

## 2014-08-02 DIAGNOSIS — M12811 Other specific arthropathies, not elsewhere classified, right shoulder: Secondary | ICD-10-CM | POA: Diagnosis not present

## 2014-08-06 DIAGNOSIS — M12811 Other specific arthropathies, not elsewhere classified, right shoulder: Secondary | ICD-10-CM | POA: Diagnosis not present

## 2014-08-20 DIAGNOSIS — M7541 Impingement syndrome of right shoulder: Secondary | ICD-10-CM | POA: Diagnosis not present

## 2014-08-20 DIAGNOSIS — M12811 Other specific arthropathies, not elsewhere classified, right shoulder: Secondary | ICD-10-CM | POA: Diagnosis not present

## 2014-08-20 DIAGNOSIS — M25511 Pain in right shoulder: Secondary | ICD-10-CM | POA: Diagnosis not present

## 2014-08-26 DIAGNOSIS — H35352 Cystoid macular degeneration, left eye: Secondary | ICD-10-CM | POA: Diagnosis not present

## 2014-08-26 DIAGNOSIS — H4011X3 Primary open-angle glaucoma, severe stage: Secondary | ICD-10-CM | POA: Diagnosis not present

## 2014-09-03 DIAGNOSIS — M12811 Other specific arthropathies, not elsewhere classified, right shoulder: Secondary | ICD-10-CM | POA: Diagnosis not present

## 2014-09-09 ENCOUNTER — Ambulatory Visit (INDEPENDENT_AMBULATORY_CARE_PROVIDER_SITE_OTHER): Payer: Medicare Other | Admitting: Ophthalmology

## 2014-09-09 DIAGNOSIS — E11319 Type 2 diabetes mellitus with unspecified diabetic retinopathy without macular edema: Secondary | ICD-10-CM

## 2014-09-09 DIAGNOSIS — H43813 Vitreous degeneration, bilateral: Secondary | ICD-10-CM | POA: Diagnosis not present

## 2014-09-09 DIAGNOSIS — I10 Essential (primary) hypertension: Secondary | ICD-10-CM

## 2014-09-09 DIAGNOSIS — E11329 Type 2 diabetes mellitus with mild nonproliferative diabetic retinopathy without macular edema: Secondary | ICD-10-CM | POA: Diagnosis not present

## 2014-09-09 DIAGNOSIS — H35033 Hypertensive retinopathy, bilateral: Secondary | ICD-10-CM

## 2014-09-11 DIAGNOSIS — M12811 Other specific arthropathies, not elsewhere classified, right shoulder: Secondary | ICD-10-CM | POA: Diagnosis not present

## 2014-09-18 DIAGNOSIS — Z471 Aftercare following joint replacement surgery: Secondary | ICD-10-CM | POA: Diagnosis not present

## 2014-09-18 DIAGNOSIS — Z96642 Presence of left artificial hip joint: Secondary | ICD-10-CM | POA: Diagnosis not present

## 2014-09-19 DIAGNOSIS — E559 Vitamin D deficiency, unspecified: Secondary | ICD-10-CM | POA: Diagnosis not present

## 2014-09-19 DIAGNOSIS — M810 Age-related osteoporosis without current pathological fracture: Secondary | ICD-10-CM | POA: Diagnosis not present

## 2014-09-19 DIAGNOSIS — Z6827 Body mass index (BMI) 27.0-27.9, adult: Secondary | ICD-10-CM | POA: Diagnosis not present

## 2014-09-19 DIAGNOSIS — Z79899 Other long term (current) drug therapy: Secondary | ICD-10-CM | POA: Diagnosis not present

## 2014-09-23 DIAGNOSIS — M12811 Other specific arthropathies, not elsewhere classified, right shoulder: Secondary | ICD-10-CM | POA: Diagnosis not present

## 2014-09-24 DIAGNOSIS — H16223 Keratoconjunctivitis sicca, not specified as Sjogren's, bilateral: Secondary | ICD-10-CM | POA: Diagnosis not present

## 2014-09-24 DIAGNOSIS — Z961 Presence of intraocular lens: Secondary | ICD-10-CM | POA: Diagnosis not present

## 2014-09-24 DIAGNOSIS — H4011X3 Primary open-angle glaucoma, severe stage: Secondary | ICD-10-CM | POA: Diagnosis not present

## 2014-09-30 DIAGNOSIS — E785 Hyperlipidemia, unspecified: Secondary | ICD-10-CM | POA: Diagnosis not present

## 2014-09-30 DIAGNOSIS — E119 Type 2 diabetes mellitus without complications: Secondary | ICD-10-CM | POA: Diagnosis not present

## 2014-09-30 DIAGNOSIS — M25511 Pain in right shoulder: Secondary | ICD-10-CM | POA: Diagnosis not present

## 2014-09-30 DIAGNOSIS — M810 Age-related osteoporosis without current pathological fracture: Secondary | ICD-10-CM | POA: Diagnosis not present

## 2014-09-30 DIAGNOSIS — I10 Essential (primary) hypertension: Secondary | ICD-10-CM | POA: Diagnosis not present

## 2014-09-30 DIAGNOSIS — Z6827 Body mass index (BMI) 27.0-27.9, adult: Secondary | ICD-10-CM | POA: Diagnosis not present

## 2014-10-01 NOTE — Pre-Procedure Instructions (Signed)
Marissa Ryan  10/01/2014   Your procedure is scheduled on:  Thursday, March 24th   Report to [facility] Admitting at 8:00 AM.   Call this number if you have problems the morning of surgery: [phone]   Remember:   Do not eat food or drink liquids after midnight Wednesday.   Take these medicines the morning of surgery with A SIP OF WATER: Neurontin, Dilaudid OR Tramadol.   Do not wear jewelry, make-up or nail polish.  Do not wear lotions, powders, or perfumes. You may NOT wear deodorant the day of surgery.  Do not shave underarms & legs 48 hours prior to surgery.   Do not bring valuables to the hospital.  [facility] is not responsible for any belongings or valuables.               Contacts, dentures or bridgework may not be worn into surgery.  Leave suitcase in the car. After surgery it may be brought to your room.  For patients admitted to the hospital, discharge time is determined by your treatment team.    Name and phone number of your driver:    Special Instructions: "Preparing for Surgery" instruction sheet.   Please read over the following fact sheets that you were given: Pain Booklet, Coughing and Deep Breathing, MRSA Information and Surgical Site Infection Prevention

## 2014-10-02 ENCOUNTER — Encounter (HOSPITAL_COMMUNITY): Payer: Self-pay

## 2014-10-02 ENCOUNTER — Encounter (HOSPITAL_COMMUNITY)
Admission: RE | Admit: 2014-10-02 | Discharge: 2014-10-02 | Disposition: A | Discharge status: Home / Self Care | Payer: Medicare Other | Source: Ambulatory Visit | Attending: Orthopedic Surgery | Admitting: Orthopedic Surgery

## 2014-10-02 HISTORY — DX: Anxiety disorder, unspecified: F41.9

## 2014-10-02 LAB — COMPREHENSIVE METABOLIC PANEL
ALBUMIN: 3.7 g/dL (ref 3.5–5.2)
ALT: 20 U/L (ref 0–35)
ANION GAP: 10 (ref 5–15)
AST: 26 U/L (ref 0–37)
Alkaline Phosphatase: 51 U/L (ref 39–117)
BUN: 12 mg/dL (ref 6–23)
CO2: 24 mmol/L (ref 19–32)
CREATININE: 0.8 mg/dL (ref 0.50–1.10)
Calcium: 9.5 mg/dL (ref 8.4–10.5)
Chloride: 105 mmol/L (ref 96–112)
GFR calc Af Amer: 75 mL/min — ABNORMAL LOW (ref >90)
GFR calc non Af Amer: 64 mL/min — ABNORMAL LOW (ref >90)
Glucose, Bld: 154 mg/dL — ABNORMAL HIGH (ref 70–99)
Potassium: 3.9 mmol/L (ref 3.5–5.1)
Sodium: 139 mmol/L (ref 135–145)
Total Bilirubin: 0.3 mg/dL (ref 0.3–1.2)
Total Protein: 6.2 g/dL (ref 6.0–8.3)

## 2014-10-02 LAB — CBC WITH DIFFERENTIAL/PLATELET
BASOS ABS: 0 10*3/uL (ref 0.0–0.1)
BASOS PCT: 0 % (ref 0–1)
EOS ABS: 0.3 10*3/uL (ref 0.0–0.7)
Eosinophils Relative: 3 % (ref 0–5)
HCT: 39.8 % (ref 36.0–46.0)
Hemoglobin: 12.6 g/dL (ref 12.0–15.0)
Lymphocytes Relative: 22 % (ref 12–46)
Lymphs Abs: 2.3 10*3/uL (ref 0.7–4.0)
MCH: 30.8 pg (ref 26.0–34.0)
MCHC: 31.7 g/dL (ref 30.0–36.0)
MCV: 97.3 fL (ref 78.0–100.0)
Monocytes Absolute: 1 10*3/uL (ref 0.1–1.0)
Monocytes Relative: 10 % (ref 3–12)
NEUTROS PCT: 65 % (ref 43–77)
Neutro Abs: 6.6 10*3/uL (ref 1.7–7.7)
PLATELETS: 298 10*3/uL (ref 150–400)
RBC: 4.09 MIL/uL (ref 3.87–5.11)
RDW: 13.3 % (ref 11.5–15.5)
WBC: 10.2 10*3/uL (ref 4.0–10.5)

## 2014-10-02 LAB — PROTIME-INR
INR: 0.97 (ref 0.00–1.49)
PROTHROMBIN TIME: 13 s (ref 11.6–15.2)

## 2014-10-02 LAB — APTT: aPTT: 28 seconds (ref 24–37)

## 2014-10-02 LAB — SURGICAL PCR SCREEN
MRSA, PCR: NEGATIVE
Staphylococcus aureus: NEGATIVE

## 2014-10-02 NOTE — Progress Notes (Signed)
Denies any chest discomfort.  Had cardiac w/u prior to her hip surgery in Aug.  Just received clearance note from Dr. Silvestre Mesi office.   DA

## 2014-10-09 MED ORDER — CLINDAMYCIN PHOSPHATE 900 MG/50ML IV SOLN
900.0000 mg | INTRAVENOUS | Status: AC
  Administered 2014-10-10: 900 mg via INTRAVENOUS
  Filled 2014-10-09: qty 50

## 2014-10-09 MED ORDER — CHLORHEXIDINE GLUCONATE 4 % EX LIQD
60.0000 mL | Freq: Once | CUTANEOUS | Status: DC
  Filled 2014-10-09: qty 60

## 2014-10-09 MED ORDER — LACTATED RINGERS IV SOLN
INTRAVENOUS | Status: DC
  Administered 2014-10-10: 09:00:00 via INTRAVENOUS

## 2014-10-10 ENCOUNTER — Inpatient Hospital Stay (HOSPITAL_COMMUNITY)
Admission: RE | Admit: 2014-10-10 | Discharge: 2014-10-13 | DRG: 483 | Disposition: A | Discharge status: Home Health | Payer: Medicare Other | Source: Ambulatory Visit | Attending: Orthopedic Surgery | Admitting: Orthopedic Surgery

## 2014-10-10 ENCOUNTER — Encounter (HOSPITAL_COMMUNITY): Payer: Self-pay | Admitting: *Deleted

## 2014-10-10 ENCOUNTER — Encounter (HOSPITAL_COMMUNITY)
Admission: RE | Disposition: A | Discharge status: Home Health | Payer: Self-pay | Source: Ambulatory Visit | Attending: Orthopedic Surgery

## 2014-10-10 ENCOUNTER — Ambulatory Visit (HOSPITAL_COMMUNITY): Payer: Medicare Other | Admitting: Anesthesiology

## 2014-10-10 DIAGNOSIS — Z96642 Presence of left artificial hip joint: Secondary | ICD-10-CM | POA: Diagnosis present

## 2014-10-10 DIAGNOSIS — M75101 Unspecified rotator cuff tear or rupture of right shoulder, not specified as traumatic: Principal | ICD-10-CM | POA: Diagnosis present

## 2014-10-10 DIAGNOSIS — M25511 Pain in right shoulder: Secondary | ICD-10-CM | POA: Diagnosis not present

## 2014-10-10 DIAGNOSIS — K589 Irritable bowel syndrome without diarrhea: Secondary | ICD-10-CM | POA: Diagnosis present

## 2014-10-10 DIAGNOSIS — Z881 Allergy status to other antibiotic agents status: Secondary | ICD-10-CM | POA: Diagnosis not present

## 2014-10-10 DIAGNOSIS — M19011 Primary osteoarthritis, right shoulder: Secondary | ICD-10-CM | POA: Diagnosis not present

## 2014-10-10 DIAGNOSIS — H9190 Unspecified hearing loss, unspecified ear: Secondary | ICD-10-CM | POA: Diagnosis present

## 2014-10-10 DIAGNOSIS — Z880 Allergy status to penicillin: Secondary | ICD-10-CM

## 2014-10-10 DIAGNOSIS — I10 Essential (primary) hypertension: Secondary | ICD-10-CM | POA: Diagnosis present

## 2014-10-10 DIAGNOSIS — Z9109 Other allergy status, other than to drugs and biological substances: Secondary | ICD-10-CM | POA: Diagnosis not present

## 2014-10-10 DIAGNOSIS — E785 Hyperlipidemia, unspecified: Secondary | ICD-10-CM | POA: Diagnosis present

## 2014-10-10 DIAGNOSIS — F419 Anxiety disorder, unspecified: Secondary | ICD-10-CM | POA: Diagnosis present

## 2014-10-10 DIAGNOSIS — E119 Type 2 diabetes mellitus without complications: Secondary | ICD-10-CM | POA: Diagnosis present

## 2014-10-10 DIAGNOSIS — Z885 Allergy status to narcotic agent status: Secondary | ICD-10-CM | POA: Diagnosis not present

## 2014-10-10 DIAGNOSIS — Z85828 Personal history of other malignant neoplasm of skin: Secondary | ICD-10-CM

## 2014-10-10 DIAGNOSIS — M810 Age-related osteoporosis without current pathological fracture: Secondary | ICD-10-CM | POA: Diagnosis present

## 2014-10-10 DIAGNOSIS — Z96619 Presence of unspecified artificial shoulder joint: Secondary | ICD-10-CM

## 2014-10-10 DIAGNOSIS — G8918 Other acute postprocedural pain: Secondary | ICD-10-CM | POA: Diagnosis not present

## 2014-10-10 HISTORY — PX: REVERSE SHOULDER ARTHROPLASTY: SHX5054

## 2014-10-10 LAB — GLUCOSE, CAPILLARY
GLUCOSE-CAPILLARY: 163 mg/dL — AB (ref 70–99)
Glucose-Capillary: 128 mg/dL — ABNORMAL HIGH (ref 70–99)
Glucose-Capillary: 139 mg/dL — ABNORMAL HIGH (ref 70–99)

## 2014-10-10 SURGERY — ARTHROPLASTY, SHOULDER, TOTAL, REVERSE
Anesthesia: Regional | Laterality: Right

## 2014-10-10 MED ORDER — ROCURONIUM BROMIDE 100 MG/10ML IV SOLN
INTRAVENOUS | Status: DC | PRN
  Administered 2014-10-10: 30 mg via INTRAVENOUS

## 2014-10-10 MED ORDER — SODIUM CHLORIDE 0.9 % IR SOLN
Status: DC | PRN
  Administered 2014-10-10: 1000 mL

## 2014-10-10 MED ORDER — ONDANSETRON HCL 4 MG PO TABS: 4.0000 mg | ORAL_TABLET | Freq: Four times a day (QID) | ORAL | Status: DC | PRN

## 2014-10-10 MED ORDER — ROCURONIUM BROMIDE 50 MG/5ML IV SOLN
INTRAVENOUS | Status: AC
  Filled 2014-10-10: qty 1

## 2014-10-10 MED ORDER — GABAPENTIN 300 MG PO CAPS
300.0000 mg | ORAL_CAPSULE | Freq: Four times a day (QID) | ORAL | Status: DC
  Administered 2014-10-10 – 2014-10-13 (×11): 300 mg via ORAL
  Filled 2014-10-10 (×10): qty 1

## 2014-10-10 MED ORDER — METOCLOPRAMIDE HCL 5 MG PO TABS: 5.0000 mg | ORAL_TABLET | Freq: Three times a day (TID) | ORAL | Status: DC | PRN

## 2014-10-10 MED ORDER — OXYCODONE HCL 5 MG PO TABS: 5.0000 mg | ORAL_TABLET | ORAL | Status: DC | PRN

## 2014-10-10 MED ORDER — ONDANSETRON HCL 4 MG/2ML IJ SOLN
INTRAMUSCULAR | Status: DC | PRN
  Administered 2014-10-10: 4 mg via INTRAVENOUS

## 2014-10-10 MED ORDER — EZETIMIBE-SIMVASTATIN 10-40 MG PO TABS
0.5000 | ORAL_TABLET | Freq: Every day | ORAL | Status: DC
  Administered 2014-10-10 – 2014-10-12 (×2): 0.5 via ORAL
  Filled 2014-10-10 (×5): qty 0.5

## 2014-10-10 MED ORDER — HYDROMORPHONE HCL 1 MG/ML IJ SOLN: 0.5000 mg | INTRAMUSCULAR | Status: DC | PRN

## 2014-10-10 MED ORDER — METOCLOPRAMIDE HCL 5 MG/ML IJ SOLN: 5.0000 mg | Freq: Three times a day (TID) | INTRAMUSCULAR | Status: DC | PRN

## 2014-10-10 MED ORDER — CLINDAMYCIN PHOSPHATE 600 MG/50ML IV SOLN
600.0000 mg | Freq: Four times a day (QID) | INTRAVENOUS | Status: AC
  Administered 2014-10-10 – 2014-10-11 (×3): 600 mg via INTRAVENOUS
  Filled 2014-10-10 (×3): qty 50

## 2014-10-10 MED ORDER — ACETAMINOPHEN 650 MG RE SUPP: 650.0000 mg | Freq: Four times a day (QID) | RECTAL | Status: DC | PRN

## 2014-10-10 MED ORDER — BUPIVACAINE-EPINEPHRINE (PF) 0.5% -1:200000 IJ SOLN
INTRAMUSCULAR | Status: DC | PRN
  Administered 2014-10-10: 20 mL via PERINEURAL

## 2014-10-10 MED ORDER — OXYCODONE HCL 5 MG PO TABS
5.0000 mg | ORAL_TABLET | ORAL | Status: DC | PRN
  Administered 2014-10-11: 10 mg via ORAL
  Administered 2014-10-12 – 2014-10-13 (×8): 5 mg via ORAL
  Filled 2014-10-10 (×5): qty 1
  Filled 2014-10-10: qty 2
  Filled 2014-10-10 (×3): qty 1

## 2014-10-10 MED ORDER — ONDANSETRON HCL 4 MG/2ML IJ SOLN
INTRAMUSCULAR | Status: AC
  Filled 2014-10-10: qty 2

## 2014-10-10 MED ORDER — LIDOCAINE HCL (CARDIAC) 20 MG/ML IV SOLN
INTRAVENOUS | Status: DC | PRN
  Administered 2014-10-10: 45 mg via INTRAVENOUS

## 2014-10-10 MED ORDER — FENTANYL CITRATE 0.05 MG/ML IJ SOLN
50.0000 ug | INTRAMUSCULAR | Status: DC | PRN
  Administered 2014-10-10: 100 ug via INTRAVENOUS
  Filled 2014-10-10: qty 2

## 2014-10-10 MED ORDER — LIDOCAINE HCL (CARDIAC) 20 MG/ML IV SOLN
INTRAVENOUS | Status: AC
  Filled 2014-10-10: qty 5

## 2014-10-10 MED ORDER — MIDAZOLAM HCL 2 MG/2ML IJ SOLN: 1.0000 mg | INTRAMUSCULAR | Status: DC | PRN

## 2014-10-10 MED ORDER — BISACODYL 5 MG PO TBEC
5.0000 mg | DELAYED_RELEASE_TABLET | Freq: Every day | ORAL | Status: DC | PRN
  Administered 2014-10-11 – 2014-10-12 (×2): 5 mg via ORAL
  Filled 2014-10-10 (×4): qty 1

## 2014-10-10 MED ORDER — GLYCOPYRROLATE 0.2 MG/ML IJ SOLN
INTRAMUSCULAR | Status: AC
  Filled 2014-10-10: qty 2

## 2014-10-10 MED ORDER — PROMETHAZINE HCL 25 MG/ML IJ SOLN: 6.2500 mg | INTRAMUSCULAR | Status: DC | PRN

## 2014-10-10 MED ORDER — POLYETHYLENE GLYCOL 3350 17 G PO PACK
17.0000 g | PACK | Freq: Every day | ORAL | Status: DC | PRN
  Administered 2014-10-11: 17 g via ORAL
  Filled 2014-10-10: qty 1

## 2014-10-10 MED ORDER — MEPERIDINE HCL 25 MG/ML IJ SOLN: 6.2500 mg | INTRAMUSCULAR | Status: DC | PRN

## 2014-10-10 MED ORDER — LOSARTAN POTASSIUM 50 MG PO TABS
50.0000 mg | ORAL_TABLET | Freq: Every day | ORAL | Status: DC
  Administered 2014-10-10 – 2014-10-12 (×3): 50 mg via ORAL
  Filled 2014-10-10 (×3): qty 1

## 2014-10-10 MED ORDER — PHENOL 1.4 % MT LIQD: 1.0000 | OROMUCOSAL | Status: DC | PRN

## 2014-10-10 MED ORDER — DIAZEPAM 5 MG PO TABS
5.0000 mg | ORAL_TABLET | Freq: Four times a day (QID) | ORAL | Status: DC | PRN
  Administered 2014-10-11: 5 mg via ORAL
  Filled 2014-10-10: qty 1

## 2014-10-10 MED ORDER — MAGNESIUM CITRATE PO SOLN: 1.0000 | Freq: Once | ORAL | Status: AC | PRN

## 2014-10-10 MED ORDER — KETOROLAC TROMETHAMINE 30 MG/ML IJ SOLN: 15.0000 mg | Freq: Once | INTRAMUSCULAR | Status: DC | PRN

## 2014-10-10 MED ORDER — PHENYLEPHRINE HCL 10 MG/ML IJ SOLN: 10.0000 mg | INTRAMUSCULAR | Status: DC | PRN

## 2014-10-10 MED ORDER — ACETAMINOPHEN 325 MG PO TABS
650.0000 mg | ORAL_TABLET | Freq: Four times a day (QID) | ORAL | Status: DC | PRN
  Administered 2014-10-10 – 2014-10-13 (×3): 650 mg via ORAL
  Filled 2014-10-10 (×3): qty 2

## 2014-10-10 MED ORDER — HYDROMORPHONE HCL 1 MG/ML IJ SOLN: 0.2500 mg | INTRAMUSCULAR | Status: DC | PRN

## 2014-10-10 MED ORDER — NEOSTIGMINE METHYLSULFATE 10 MG/10ML IV SOLN
INTRAVENOUS | Status: AC
  Filled 2014-10-10: qty 1

## 2014-10-10 MED ORDER — SODIUM CHLORIDE 0.9 % IV SOLN
10.0000 mg | INTRAVENOUS | Status: DC | PRN
  Administered 2014-10-10: 10 ug/min via INTRAVENOUS

## 2014-10-10 MED ORDER — MENTHOL 3 MG MT LOZG
1.0000 | LOZENGE | OROMUCOSAL | Status: DC | PRN
  Filled 2014-10-10: qty 9

## 2014-10-10 MED ORDER — ASPIRIN EC 81 MG PO TBEC
81.0000 mg | DELAYED_RELEASE_TABLET | Freq: Every day | ORAL | Status: DC
  Administered 2014-10-10 – 2014-10-13 (×4): 81 mg via ORAL
  Filled 2014-10-10 (×4): qty 1

## 2014-10-10 MED ORDER — PROPOFOL 10 MG/ML IV BOLUS
INTRAVENOUS | Status: DC | PRN
  Administered 2014-10-10: 130 mg via INTRAVENOUS

## 2014-10-10 MED ORDER — ONDANSETRON HCL 4 MG/2ML IJ SOLN
4.0000 mg | Freq: Four times a day (QID) | INTRAMUSCULAR | Status: DC | PRN
  Administered 2014-10-11: 4 mg via INTRAVENOUS
  Filled 2014-10-10: qty 2

## 2014-10-10 MED ORDER — FENTANYL CITRATE 0.05 MG/ML IJ SOLN
INTRAMUSCULAR | Status: AC
  Filled 2014-10-10: qty 5

## 2014-10-10 MED ORDER — SERTRALINE HCL 50 MG PO TABS
50.0000 mg | ORAL_TABLET | Freq: Every day | ORAL | Status: DC
  Administered 2014-10-10 – 2014-10-13 (×4): 50 mg via ORAL
  Filled 2014-10-10 (×4): qty 1

## 2014-10-10 MED ORDER — DIAZEPAM 5 MG PO TABS: 2.5000 mg | ORAL_TABLET | Freq: Four times a day (QID) | ORAL | Status: DC | PRN

## 2014-10-10 MED ORDER — NEOSTIGMINE METHYLSULFATE 10 MG/10ML IV SOLN
INTRAVENOUS | Status: DC | PRN
  Administered 2014-10-10: 3 mg via INTRAVENOUS

## 2014-10-10 MED ORDER — GLYCOPYRROLATE 0.2 MG/ML IJ SOLN
INTRAMUSCULAR | Status: DC | PRN
  Administered 2014-10-10: 0.1 mg via INTRAVENOUS
  Administered 2014-10-10: .5 mg via INTRAVENOUS

## 2014-10-10 MED ORDER — ALUM & MAG HYDROXIDE-SIMETH 200-200-20 MG/5ML PO SUSP: 30.0000 mL | ORAL | Status: DC | PRN

## 2014-10-10 MED ORDER — TRAMADOL HCL 50 MG PO TABS: 50.0000 mg | ORAL_TABLET | Freq: Four times a day (QID) | ORAL | Status: DC | PRN

## 2014-10-10 MED ORDER — PROPOFOL 10 MG/ML IV BOLUS
INTRAVENOUS | Status: AC
  Filled 2014-10-10: qty 20

## 2014-10-10 MED ORDER — DOCUSATE SODIUM 100 MG PO CAPS
100.0000 mg | ORAL_CAPSULE | Freq: Two times a day (BID) | ORAL | Status: DC
  Administered 2014-10-10 – 2014-10-13 (×6): 100 mg via ORAL
  Filled 2014-10-10 (×6): qty 1

## 2014-10-10 MED ORDER — LACTATED RINGERS IV SOLN
INTRAVENOUS | Status: DC
  Administered 2014-10-10: 75 mL/h via INTRAVENOUS

## 2014-10-10 SURGICAL SUPPLY — 67 items
ADH SKN CLS APL DERMABOND .7 (GAUZE/BANDAGES/DRESSINGS) ×1
BLADE SAW SGTL 83.5X18.5 (BLADE) ×3 IMPLANT
BRUSH FEMORAL CANAL (MISCELLANEOUS) IMPLANT
CAPT SHLDR REVTOTAL 1 ×2 IMPLANT
COVER SURGICAL LIGHT HANDLE (MISCELLANEOUS) ×3 IMPLANT
DERMABOND ADVANCED (GAUZE/BANDAGES/DRESSINGS) ×2
DERMABOND ADVANCED .7 DNX12 (GAUZE/BANDAGES/DRESSINGS) ×1 IMPLANT
DRAPE ORTHO SPLIT 77X108 STRL (DRAPES) ×6
DRAPE SURG 17X11 SM STRL (DRAPES) ×3 IMPLANT
DRAPE SURG ORHT 6 SPLT 77X108 (DRAPES) IMPLANT
DRAPE U-SHAPE 47X51 STRL (DRAPES) ×3 IMPLANT
DRILL BIT 7/64X5 (BIT) ×3 IMPLANT
DRSG AQUACEL AG ADV 3.5X10 (GAUZE/BANDAGES/DRESSINGS) ×3 IMPLANT
DRSG MEPILEX BORDER 4X8 (GAUZE/BANDAGES/DRESSINGS) IMPLANT
DURAPREP 26ML APPLICATOR (WOUND CARE) ×3 IMPLANT
ELECT BLADE 4.0 EZ CLEAN MEGAD (MISCELLANEOUS) ×3
ELECT CAUTERY BLADE 6.4 (BLADE) ×3 IMPLANT
ELECT REM PT RETURN 9FT ADLT (ELECTROSURGICAL) ×3
ELECTRODE BLDE 4.0 EZ CLN MEGD (MISCELLANEOUS) ×1 IMPLANT
ELECTRODE REM PT RTRN 9FT ADLT (ELECTROSURGICAL) ×1 IMPLANT
FACESHIELD WRAPAROUND (MASK) ×9 IMPLANT
FACESHIELD WRAPAROUND OR TEAM (MASK) ×3 IMPLANT
GLOVE BIO SURGEON STRL SZ7.5 (GLOVE) ×3 IMPLANT
GLOVE BIO SURGEON STRL SZ8 (GLOVE) ×3 IMPLANT
GLOVE EUDERMIC 7 POWDERFREE (GLOVE) ×3 IMPLANT
GLOVE SS BIOGEL STRL SZ 7.5 (GLOVE) ×1 IMPLANT
GLOVE SUPERSENSE BIOGEL SZ 7.5 (GLOVE) ×2
GOWN STRL REUS W/ TWL LRG LVL3 (GOWN DISPOSABLE) IMPLANT
GOWN STRL REUS W/ TWL XL LVL3 (GOWN DISPOSABLE) ×2 IMPLANT
GOWN STRL REUS W/TWL LRG LVL3 (GOWN DISPOSABLE)
GOWN STRL REUS W/TWL XL LVL3 (GOWN DISPOSABLE) ×6
HANDPIECE INTERPULSE COAX TIP (DISPOSABLE)
KIT BASIN OR (CUSTOM PROCEDURE TRAY) ×3 IMPLANT
KIT ROOM TURNOVER OR (KITS) ×3 IMPLANT
MANIFOLD NEPTUNE II (INSTRUMENTS) ×3 IMPLANT
NDL HYPO 25GX1X1/2 BEV (NEEDLE) IMPLANT
NDL SUT 6 .5 CRC .975X.05 MAYO (NEEDLE) IMPLANT
NEEDLE HYPO 25GX1X1/2 BEV (NEEDLE) IMPLANT
NEEDLE MAYO TAPER (NEEDLE)
NS IRRIG 1000ML POUR BTL (IV SOLUTION) ×3 IMPLANT
PACK SHOULDER (CUSTOM PROCEDURE TRAY) ×3 IMPLANT
PAD ARMBOARD 7.5X6 YLW CONV (MISCELLANEOUS) ×6 IMPLANT
PASSER SUT SWANSON 36MM LOOP (INSTRUMENTS) IMPLANT
PRESSURIZER FEMORAL UNIV (MISCELLANEOUS) IMPLANT
SET HNDPC FAN SPRY TIP SCT (DISPOSABLE) IMPLANT
SLING ARM FOAM STRAP LRG (SOFTGOODS) IMPLANT
SLING ARM FOAM STRAP MED (SOFTGOODS) ×2 IMPLANT
SPONGE LAP 18X18 X RAY DECT (DISPOSABLE) ×6 IMPLANT
SPONGE LAP 4X18 X RAY DECT (DISPOSABLE) IMPLANT
SUCTION FRAZIER TIP 10 FR DISP (SUCTIONS) ×3 IMPLANT
SUT BONE WAX W31G (SUTURE) IMPLANT
SUT FIBERWIRE #2 38 T-5 BLUE (SUTURE) ×6
SUT MNCRL AB 3-0 PS2 18 (SUTURE) ×3 IMPLANT
SUT VIC AB 1 CT1 27 (SUTURE) ×3
SUT VIC AB 1 CT1 27XBRD ANBCTR (SUTURE) ×1 IMPLANT
SUT VIC AB 2-0 CT1 27 (SUTURE) ×3
SUT VIC AB 2-0 CT1 TAPERPNT 27 (SUTURE) ×1 IMPLANT
SUT VIC AB 2-0 SH 27 (SUTURE)
SUT VIC AB 2-0 SH 27X BRD (SUTURE) IMPLANT
SUTURE FIBERWR #2 38 T-5 BLUE (SUTURE) ×2 IMPLANT
SYR 30ML SLIP (SYRINGE) ×3 IMPLANT
SYR CONTROL 10ML LL (SYRINGE) IMPLANT
TOWEL OR 17X24 6PK STRL BLUE (TOWEL DISPOSABLE) ×3 IMPLANT
TOWEL OR 17X26 10 PK STRL BLUE (TOWEL DISPOSABLE) ×3 IMPLANT
TOWER CARTRIDGE SMART MIX (DISPOSABLE) IMPLANT
TRAY FOLEY CATH 16FRSI W/METER (SET/KITS/TRAYS/PACK) IMPLANT
WATER STERILE IRR 1000ML POUR (IV SOLUTION) ×3 IMPLANT

## 2014-10-10 NOTE — Discharge Instructions (Signed)
° °  Kevin M. Supple, M.D., F.A.A.O.S. °Orthopaedic Surgery °Specializing in Arthroscopic and Reconstructive °Surgery of the Shoulder and Knee °[phone] °[address] - [place], [place] [address] - Fax [phone] ° ° °POST-OP TOTAL SHOULDER REPLACEMENT/SHOULDER HEMIARTHROPLASTY INSTRUCTIONS ° °1. Call the office at [phone] to schedule your first post-op appointment 10-14 days from the date of your surgery. ° °2. The bandage over your incision is waterproof. You may begin showering with this dressing on. You may leave this dressing on until first follow up appointment within 2 weeks. If you would like to remove it you may do so after the 5th day. Go slow and tug at the borders gently to break the bond the dressing has with the skin. The steri strips may come off with the dressing. At this point if there is no drainage it is okay to go without a bandage or you may cover it with a light guaze and tape. Leave the steri-strips in place over your incision. You can expect drainage that is bloody or yellow in nature that should gradually decrease from day of surgery. Change your dressing daily until drainage is completely resolved, then you may feel free to go without a bandage. You can also expect significant bruising around your shoulder that will drift down your arm and into your chest wall. This is very normal and should resolve over several days. ° ° 3. Wear your sling/immobilizer at all times except to perform the exercises below or to occasionally let your arm dangle by your side to stretch your elbow. You also need to sleep in your sling immobilizer until instructed otherwise. ° °4. Range of motion to your elbow, wrist, and hand are encouraged 3-5 times daily. Exercise to your hand and fingers helps to reduce swelling you may experience. ° °5. Utilize ice to the shoulder 3-5 times minimum a day and additionally if you are experiencing pain. ° °6. Prescriptions for a pain medication and a muscle  relaxant are provided for you. It is recommended that if you are experiencing pain that you pain medication alone is not controlling, add the muscle relaxant along with the pain medication which can give additional pain relief. The first 1-2 days is generally the most severe of your pain and then should gradually decrease. As your pain lessens it is recommended that you decrease your use of the pain medications to an "as needed basis'" only and to always comply with the recommended dosages of the pain medications. ° °7. Pain medications can produce constipation along with their use. If you experience this, the use of an over the counter stool softener or laxative daily is recommended.  ° °8. For most patients, if insurance allows, home health services to include therapy has been arranged. ° °9. For additional questions or concerns, please do not hesitate to call the office. If after hours there is an answering service to forward your concerns to the physician on call. ° °POST-OP EXERCISES ° °Pendulum Exercises ° °Perform pendulum exercises while standing and bending at the waist. Support your uninvolved arm on a table or chair and allow your operated arm to hang freely. Make sure to do these exercises passively - not using you shoulder muscles. ° °Repeat 20 times. Do 3 sessions per day. ° ° ° ° °

## 2014-10-10 NOTE — Anesthesia Procedure Notes (Addendum)
Anesthesia Regional Block:  Interscalene brachial plexus block  Pre-Anesthetic Checklist: ,, timeout performed, Correct Patient, Correct Site, Correct Laterality, Correct Procedure, Correct Position, site marked, Risks and benefits discussed,  Surgical consent,  Pre-op evaluation,  At surgeon's request and post-op pain management  Laterality: Right  Prep: chloraprep       Needles:  Injection technique: Single-shot  Needle Type: Stimulator Needle - 40     Needle Length: 4cm 4 cm Needle Gauge: 22 and 22 G    Additional Needles:  Procedures: ultrasound guided (picture in chart) Interscalene brachial plexus block Narrative:  Injection made incrementally with aspirations every 5 mL.  Performed by: Personally  Anesthesiologist: Nolon Nations   Procedure Name: Intubation Date/Time: 10/10/2014 10:19 AM Performed by: Kyung Rudd Pre-anesthesia Checklist: Patient identified, Emergency Drugs available, Suction available, Patient being monitored and Timeout performed Patient Re-evaluated:Patient Re-evaluated prior to inductionOxygen Delivery Method: Circle system utilized Preoxygenation: Pre-oxygenation with 100% oxygen Intubation Type: IV induction Ventilation: Mask ventilation without difficulty Laryngoscope Size: Mac and 3 Grade View: Grade II Tube type: Oral Tube size: 7.0 mm Number of attempts: 1 Airway Equipment and Method: Stylet Placement Confirmation: ETT inserted through vocal cords under direct vision,  positive ETCO2 and breath sounds checked- equal and bilateral Secured at: 21 cm Tube secured with: Tape Dental Injury: Teeth and Oropharynx as per pre-operative assessment

## 2014-10-10 NOTE — Op Note (Signed)
10/10/2014  11:48 AM  PATIENT:   Marissa Ryan  81 y.o. female  PRE-OPERATIVE DIAGNOSIS:  RIGHT SHOULDER ROTATOR CUFF TEAR ARTHROPATHY  POST-OPERATIVE DIAGNOSIS:  SAME  PROCEDURE:  RIGHT RSA #12 stem, +9 poly, 38 eccentric glenosphere  SURGEON:  Yovana Scogin, Metta Clines M.D.  ASSISTANTS: Shuford pac   ANESTHESIA:   GET + ISB  EBL: 200  SPECIMEN:  none  Drains: none   PATIENT DISPOSITION:  PACU - hemodynamically stable.    PLAN OF CARE: Admit to inpatient   Dictation# [id number]

## 2014-10-10 NOTE — Op Note (Signed)
Marissa Ryan, Marissa Ryan                  ACCOUNT NO.:  [id number]  MEDICAL RECORD NO.:  [id number]  LOCATION:  [id number]                        FACILITY:  [facility]  PHYSICIAN:  Metta Clines. Damacio Weisgerber, M.D.  DATE OF BIRTH:  16-Feb-1925  DATE OF PROCEDURE:  10/10/2014 DATE OF DISCHARGE:  10/10/2014                              OPERATIVE REPORT   PREOPERATIVE DIAGNOSIS:  End-stage right shoulder rotator cuff tear arthropathy.  POSTOPERATIVE DIAGNOSIS:  End-stage right shoulder rotator cuff tear arthropathy.  PROCEDURE:  Right shoulder reverse arthroplasty utilizing a press-fit size 12/1 epi, DePuy stem, a +9 polyethylene insert, and a 38 eccentric glenoid sphere.  SURGEON:  Metta Clines. Ferris Fielden, M.D.  Terrence DupontOlivia Mackie A. Shuford, PA-C.  ANESTHESIA:  General endotracheal as well as an interscalene block.  ESTIMATED BLOOD LOSS:  200 mL.  DRAINS:  None.  HISTORY:  Marissa Ryan is an 81 year old female who had chronic and progressive increasing right shoulder pain related to end-stage rotator cuff tear arthropathy with symptoms that have been refractory to prolonged multiple attempts at conservative management.  Due to her increasing pain and functional limitations, she is brought to the operating room at this time for planned right shoulder reverse arthroplasty as described below.  I preoperatively counseled Marissa Ryan regarding treatment options and risks versus benefits thereof.  Possible surgical complications were all reviewed including potential for bleeding, infection, neurovascular injury, persistent pain, loss of motion, anesthetic complication, failure of the implant, and possible need for additional surgery.  She understands and accepts and agrees with our planned procedure.  PROCEDURE IN DETAIL:  After undergoing routine preop evaluation, the patient received prophylactic antibiotics.  An interscalene block was established in the holding area by the Anesthesia Department.  Placed supine on  the operating table, underwent smooth induction of a general endotracheal anesthesia.  Placed into beach-chair position and appropriately padded and protected.  Right shoulder girdle region was sterilely prepped and draped in standard fashion.  Time-out was called. An anterior deltopectoral approach in right shoulder was made through a 10 cm incision.  Skin flaps were elevated,and electrocautery was used for hemostasis.  Dissection carried deeply to the deltopectoral interval with the cephalic vein retracted laterally.  At deltoid, the upper cm pec major was tenotomized to enhance exposure.  The conjoined tendon was mobilized and retracted medially.  Adhesions divided beneath the deltoid, self-retaining retractors were placed.  There had been a previous rupture of long head biceps tendon and chronic rupture of the subscapularis and residual soft tissue envelope, it was then divided away from the lesser tuberosity.  Capsular releases of the anterior, anterior-inferior, and inferior aspect of the humeral head allowed delivery of the head through the wound.  The rotator cuff was completely deficient superiorly, soft tissue remnants were removed.  Once the humeral head and then delivered through the wound, we  used a rongeur to open the apex of the humeral head and performed hand reaming of the canal up to a size 12.  The intramedullary guide was then placed.  We made our humeral head resection at 0 degrees retroversion at the appropriate level using oscillating saw.  At this point, a metal cap was  placed through the cut surface of the proximal humerus.  We then exposed the glenoid using Fukuda, pitchfork, and snake tongue retractors. The remnants of the labrum were then excised circumferentially.  We released the medial triceps and inferior aspect of the glenoid allowing 1 cm exposure for margin of the inferior glenoid.  A guide pin was then directed into the center of the glenoid using the  glenoid guide, and we then used our standard reamer to ream the glenoid to a subacromial bony bed.  The periphery of the ring was then utilized as well.  Then with irrigator, central drill hole was placed  Our glenoid base plate was then impacted into position, then fastened using standard technique with inferior and superior locking screws and 3 proximal nonlocking screws, all with good bony purchase and fixation.  A 38 eccentric glenosphere was then seated across the base plate and properly positioned sequentially tightened, impacted and retightened for stable fixation. We then returned our attention to the metaphysis of the proximal humerus where reaming guide was placed and we reamed with a size 1 epi reamer to the appropriate level.  A trial was then placed.  Reduction was performed showing overall good position and stability.  The trials were then removed.  The final press-fit stem was assembled and inserted and impacted at 0 degrees retroversion with excellent fixation.  We then performed once again trial reductions, ultimately the +5 poly gave the best soft tissue balance, good coverage, and good stability.  The final +9 poly was then introduced, seated, the final reduction was performed. The shoulder taken through range of motion showed excellent motion and stability.  The joint was copiously irrigated.  Hemostasis was obtained. We confirmed the scapularis was completely deficient, atrophic, and nonviable such as no repair was attempted.  We then reapproximated the deltopectoral interval with series of  #1 Vicryl sutures, 2-0 Vicryl used for subcu layer, intracuticular 3-0 Monocryl for the skin followed by Dermabond and dry Aquacel dressing. Right arm was placed in a sling. The patient was awakened, extubated, and taken to recovery room in stable condition.  Tracy A. Shuford, PA-C, was used as an Environmental consultant throughout this case, essential for help with positioning the patient,  positioning the extremity, management of the retractors, tissue manipulation, implantation of the prosthesis, wound closure, and intraoperative decision making.     Metta Clines. Aijalon Demuro, M.D.     KMS/MEDQ  D:  10/10/2014  T:  10/10/2014  Job:  [id number]

## 2014-10-10 NOTE — Transfer of Care (Signed)
Immediate Anesthesia Transfer of Care Note  Patient: Marissa Ryan  Procedure(s) Performed: Procedure(s): RIGHT REVERSE SHOULDER ARTHROPLASTY (Right)  Patient Location: PACU  Anesthesia Type:General  Level of Consciousness: awake, alert  and oriented  Airway & Oxygen Therapy: Patient Spontanous Breathing and Patient connected to nasal cannula oxygen  Post-op Assessment: Report given to RN and Post -op Vital signs reviewed and stable  Post vital signs: Reviewed and stable  Last Vitals:  Filed Vitals:   10/10/14 0945  BP: 147/66  Pulse: 87  Temp:   Resp: 20    Complications: No apparent anesthesia complications

## 2014-10-10 NOTE — Progress Notes (Signed)
Utilization review completed.  

## 2014-10-10 NOTE — H&P (Signed)
Marissa Ryan    Chief Complaint: LEFT ROTATOR CUFF ARTHROPATHY HPI: The patient is a 81 y.o. female with end stage right shoulder rotator cuff tear arthropathy  Past Medical History  Diagnosis Date  . Osteoporosis   . Hypertension   . Arthritis   . Gallstones   . Hyperlipidemia   . IBS (irritable bowel syndrome)   . Diabetes mellitus without complication   . Cancer     hx of skin cancer removal on hand   . Anxiety     Past Surgical History  Procedure Laterality Date  . Cholecystectomy  1999  . Back surgery      x2  . Appendectomy    . Rotator cuff repair    . Wrist surgery      left   . Eye surgery      for glaucoma   . Cataract surgery       bilateral   . Left eye peeling surgery     . Left femur bone surgery       1978  . Total hip arthroplasty Left 02/20/2014    Procedure: LEFT TOTAL HIP ARTHROPLASTY ANTERIOR APPROACH;  Surgeon: Marissa Alf, MD;  Location: [facility];  Service: Orthopedics;  Laterality: Left;    Family History  Problem Relation Age of Onset  . Stroke Mother   . Hypertension Mother   . Ovarian cancer Sister     Social History:  reports that she has never smoked. She has never used smokeless tobacco. She reports that she drinks alcohol. She reports that she does not use illicit drugs.  Allergies:  Allergies  Allergen Reactions  . Robaxin [Methocarbamol] Rash    Red rash on right side of face and forehead cleared after benadryl given ( per [name] )  . Ancef [Cefazolin] Diarrhea  . Morphine And Related Itching  . Penicillins Itching  . Flurbiprofen Diarrhea    Medications Prior to Admission  Medication Sig Dispense Refill  . ALPRAZolam (XANAX) 0.25 MG tablet Take 0.25-0.5 mg by mouth at bedtime as needed for sleep.    Marland Kitchen aspirin EC 81 MG tablet Take 81 mg by mouth daily.    . Cinnamon 500 MG capsule Take 500 mg by mouth daily.    Marland Kitchen docusate sodium 100 MG CAPS Take 100 mg by mouth 2 (two) times daily. (Patient taking differently: Take 100  mg by mouth daily as needed (constipation). ) 10 capsule 0  . ezetimibe-simvastatin (VYTORIN) 10-40 MG per tablet Take 0.5 tablets by mouth at bedtime.     . gabapentin (NEURONTIN) 300 MG capsule Take 300 mg by mouth 4 (four) times daily.     Marland Kitchen losartan (COZAAR) 50 MG tablet Take 50 mg by mouth daily with supper.     . metFORMIN (GLUCOPHAGE) 500 MG tablet Take 500 mg by mouth daily with breakfast.    . Omega-3 Fatty Acids (FISH OIL) 1000 MG CAPS Take 1 capsule by mouth daily.    Marland Kitchen oxyCODONE (OXY IR/ROXICODONE) 5 MG immediate release tablet Take 5 mg by mouth every 6 (six) hours as needed for moderate pain or severe pain.    . polyethylene glycol (MIRALAX / GLYCOLAX) packet Take 17 g by mouth daily as needed for mild constipation. 14 each 0  . senna (SENOKOT) 8.6 MG TABS tablet Take 1 tablet by mouth daily as needed for mild constipation.    . sertraline (ZOLOFT) 50 MG tablet Take 50 mg by mouth daily.    Marland Kitchen  Vitamin D, Ergocalciferol, (DRISDOL) 50000 UNITS CAPS capsule Take 50,000 Units by mouth every 7 (seven) days.    Marland Kitchen acetaminophen (TYLENOL) 325 MG tablet Take 2 tablets (650 mg total) by mouth every 6 (six) hours as needed for mild pain (or Fever >/= 101). (Patient not taking: Reported on 10/02/2014) 40 tablet 0  . bisacodyl (DULCOLAX) 10 MG suppository Place 1 suppository (10 mg total) rectally daily as needed for mild constipation or moderate constipation. (Patient not taking: Reported on 10/02/2014) 12 suppository 0  . cyclobenzaprine (FLEXERIL) 10 MG tablet Take 1 tablet (10 mg total) by mouth 3 (three) times daily as needed for muscle spasms. (Patient not taking: Reported on 10/02/2014) 90 tablet 0  . HYDROmorphone (DILAUDID) 2 MG tablet Take 1 tablet (2 mg total) by mouth every 4 (four) hours as needed for moderate pain or severe pain. (Patient not taking: Reported on 10/02/2014) 80 tablet 0  . metoCLOPramide (REGLAN) 5 MG tablet Take 1-2 tablets (5-10 mg total) by mouth every 8 (eight) hours as  needed for nausea (if ondansetron (ZOFRAN) ineffective.). (Patient not taking: Reported on 10/02/2014) 40 tablet 0  . ondansetron (ZOFRAN) 4 MG tablet Take 1 tablet (4 mg total) by mouth every 6 (six) hours as needed for nausea. (Patient not taking: Reported on 10/02/2014) 40 tablet 0  . rivaroxaban (XARELTO) 10 MG TABS tablet Take 1 tablet (10 mg total) by mouth daily with breakfast. Take Xarelto for two and a half more weeks, then discontinue Xarelto. Once the patient has completed the Xarelto, they may resume the 81 mg Aspirin. (Patient not taking: Reported on 10/02/2014) 16 tablet 0  . traMADol (ULTRAM) 50 MG tablet Take 1-2 tablets (50-100 mg total) by mouth every 6 (six) hours as needed (mild pain). (Patient not taking: Reported on 10/02/2014) 80 tablet 1     Physical Exam: right shoulder with painful and restricted motion as noted at recent office vists  Vitals  Temp:  [97.9 F (36.6 C)] 97.9 F (36.6 C) (03/24 0828) Pulse Rate:  [72] 72 (03/24 0828) Resp:  [20] 20 (03/24 0828) BP: (152)/(41) 152/41 mmHg (03/24 0828) SpO2:  [97 %] 97 % (03/24 0828) Weight:  [68.947 kg (152 lb)] 68.947 kg (152 lb) (03/24 0828)  Assessment/Plan  Impression: RIGHT SHOULDER ROTATOR CUFF TEAR ARTHROPATHY  Plan of Action: Procedure(s): RIGHT REVERSE SHOULDER ARTHROPLASTY  Marissa Ryan 10/10/2014, 9:28 AM

## 2014-10-10 NOTE — Anesthesia Preprocedure Evaluation (Addendum)
Anesthesia Evaluation  Patient identified by MRN, date of birth, ID band Patient awake  General Assessment Comment:Had peanut butter at 1130  Reviewed: Allergy & Precautions, H&P , NPO status , Patient's Chart, lab work & pertinent test results  Airway Mallampati: II  TM Distance: >3 FB Neck ROM: Full    Dental no notable dental hx. (+) Missing,    Pulmonary neg pulmonary ROS,  breath sounds clear to auscultation  Pulmonary exam normal       Cardiovascular hypertension, negative cardio ROS  Rhythm:Regular Rate:Normal     Neuro/Psych negative neurological ROS  negative psych ROS   GI/Hepatic negative GI ROS, Neg liver ROS,   Endo/Other  diabetes, Oral Hypoglycemic Agents  Renal/GU negative Renal ROS  negative genitourinary   Musculoskeletal negative musculoskeletal ROS (+)   Abdominal   Peds negative pediatric ROS (+)  Hematology negative hematology ROS (+)   Anesthesia Other Findings   Reproductive/Obstetrics negative OB ROS                            Anesthesia Physical  Anesthesia Plan  ASA: II  Anesthesia Plan: General and Regional   Post-op Pain Management:    Induction: Intravenous  Airway Management Planned: Oral ETT  Additional Equipment: None  Intra-op Plan:   Post-operative Plan: Extubation in OR  Informed Consent: I have reviewed the patients History and Physical, chart, labs and discussed the procedure including the risks, benefits and alternatives for the proposed anesthesia with the patient or authorized representative who has indicated his/her understanding and acceptance.   Dental advisory given  Plan Discussed with: CRNA and Surgeon  Anesthesia Plan Comments:         Anesthesia Quick Evaluation

## 2014-10-10 NOTE — Anesthesia Postprocedure Evaluation (Signed)
Anesthesia Post Note  Patient: Marissa Ryan  Procedure(s) Performed: Procedure(s) (LRB): RIGHT REVERSE SHOULDER ARTHROPLASTY (Right)  Anesthesia type: General + interscalene block  Patient location: PACU  Post pain: Pain level controlled  Post assessment: Post-op Vital signs reviewed  Last Vitals: BP 149/66 mmHg  Pulse 65  Temp(Src) 36.6 C (Oral)  Resp 16  Ht 5\' 5"  (1.651 m)  Wt 152 lb (68.947 kg)  BMI 25.29 kg/m2  SpO2 93%  Post vital signs: Reviewed  Level of consciousness: sedated  Complications: No apparent anesthesia complications

## 2014-10-11 MED ORDER — MAGNESIUM CITRATE PO SOLN: 1.0000 | Freq: Once | ORAL | Status: AC | PRN

## 2014-10-11 MED ORDER — MAGNESIUM CITRATE PO SOLN: 1.0000 | Freq: Once | ORAL | Status: DC

## 2014-10-11 MED ORDER — BISACODYL 10 MG RE SUPP
10.0000 mg | Freq: Every day | RECTAL | Status: DC | PRN
  Administered 2014-10-11: 10 mg via RECTAL
  Filled 2014-10-11: qty 1

## 2014-10-11 NOTE — Progress Notes (Signed)
Occupational Therapy Evaluation Patient Details Name: Marissa Ryan MRN: [id number] DOB: [birth year] Today's Date: 10/11/2014    History of Present Illness s/p R reverse TSA   Clinical Impression   PTA, pt independent with ADL and mobility per husband. Pt apparently confused at this time. Discussed with nsg. Most likely due to meds. CNA states pt was mobilizing well prior to pain meds.  Unable to complete education at this time due to AMS. Educated family on protocol. Pt not safe to D/C home at this time due to confusion and mod A required for mobility. Will reassess in am to help with D/C planning. Pt will need to be mod i with mobility to safely D/C home with elderly husband. If pt clears cognitively, pt may be appropriate for D/C late Sat pm or Sunday. Recommend HHOT given pt's risk for falls and prior level of independence.  Will follow acutely     Follow Up Recommendations  Home health OT;Supervision/Assistance - 24 hour    Equipment Recommendations  None recommended by OT    Recommendations for Other Services  PT consult     Precautions / Restrictions Precautions Precautions: Shoulder Type of Shoulder Precautions: active protocol. A/PROM FF 90; Abd 60; ER 30; pendulums OK; AROM elbow/wrist/hand. Shoulder Interventions: Shoulder sling/immobilizer;At all times;Off for dressing/bathing/exercises Precaution Booklet Issued: Yes (comment) Precaution Comments: reviewed with family Required Braces or Orthoses: Sling Restrictions Weight Bearing Restrictions: Yes RUE Weight Bearing: Non weight bearing      Mobility Bed Mobility               General bed mobility comments: Pt up in chair. Per nursing, pt trying to get OOB by herself  Transfers Overall transfer level: Needs assistance Equipment used: 1 person hand held assist Transfers: Sit to/from Stand Sit to Stand: Mod assist         General transfer comment: Pt unsafe at this time to attempt to ambulate due to  confusion CNA states pt was moving well prior to pain meds    Balance Overall balance assessment: Needs assistance   Sitting balance-Leahy Scale: Poor Sitting balance - Comments: leaning posteriorly   Standing balance support: Single extremity supported Standing balance-Leahy Scale: Poor Standing balance comment: posterior bias                            ADL Overall ADL's : Needs assistance/impaired                                     Functional mobility during ADLs: Moderate assistance General ADL Comments: Pt currently requires Max A with all ADL due to AMS and pain                     Pertinent Vitals/Pain Pain Assessment: Faces Faces Pain Scale: Hurts little more Pain Location: R shoulder with movement Pain Descriptors / Indicators: Grimacing Pain Intervention(s): Limited activity within patient's tolerance;Monitored during session;Repositioned;Ice applied     Hand Dominance Left   Extremity/Trunk Assessment Upper Extremity Assessment Upper Extremity Assessment: RUE deficits/detail RUE Deficits / Details: in sling. moving hand elbow WFL. attempting to move R shoulder functionally, but grimaces in pain RUE: Unable to fully assess due to pain (due to mental status) RUE Coordination: decreased gross motor   Lower Extremity Assessment Lower Extremity Assessment: Defer to PT evaluation   Cervical / Trunk  Assessment Cervical / Trunk Assessment: Kyphotic   Communication Communication Communication: HOH (talk to L ear)   Cognition Arousal/Alertness: Lethargic;Suspect due to medications Behavior During Therapy: Restless Overall Cognitive Status: Impaired/Different from baseline Area of Impairment: Orientation;Attention;Memory;Following commands;Safety/judgement;Awareness;Problem solving Orientation Level: Disoriented to;Place;Time;Situation Current Attention Level: Focused Memory: Decreased recall of precautions;Decreased short-term  memory Following Commands: Follows one step commands inconsistently Safety/Judgement: Decreased awareness of safety;Decreased awareness of deficits Awareness: Intellectual Problem Solving: Slow processing;Decreased initiation;Difficulty sequencing General Comments: most likely due to meds. Had oxy/valium 0842 per nsg   General Comments       Exercises Exercises: Shoulder     Shoulder Instructions Shoulder Instructions Donning/doffing sling/immobilizer: Maximal assistance Correct positioning of sling/immobilizer: Maximal assistance ROM for elbow, wrist and digits of operated UE: Maximal assistance Positioning of UE while sleeping: Maximal assistance    Home Living Family/patient expects to be discharged to:: Private residence Living Arrangements: Spouse/significant other Available Help at Discharge: Family;Available 24 hours/day Type of Home: House Home Access: Stairs to enter [employer] of Steps: 2 Entrance Stairs-Rails: Right Home Layout: One level     Bathroom Shower/Tub: Occupational psychologist: Standard Bathroom Accessibility: Yes How Accessible: Accessible via walker Home Equipment: [place] - 2 wheels;Cane - single point;Bedside commode;Grab bars - tub/shower;Hand held shower head;Shower seat          Prior Functioning/Environment Level of Independence: Independent             OT Diagnosis: Generalized weakness;Acute pain;Altered mental status   OT Problem List: Decreased strength;Decreased range of motion;Decreased activity tolerance;Impaired balance (sitting and/or standing);Decreased coordination;Decreased safety awareness;Decreased knowledge of use of DME or AE;Decreased knowledge of precautions;Impaired UE functional use;Pain   OT Treatment/Interventions: Self-care/ADL training;Therapeutic exercise;DME and/or AE instruction;Therapeutic activities;Patient/family education;Balance training;Cognitive remediation/compensation    OT  Goals(Current goals can be found in the care plan section) Acute Rehab OT Goals Patient Stated Goal: pt unable to state OT Goal Formulation: With family Time For Goal Achievement: 10/18/14 Potential to Achieve Goals: Good  OT Frequency: Min 3X/week   Barriers to D/C:            Co-evaluation              End of Session Equipment Utilized During Treatment: Gait belt Nurse Communication: Mobility status;Precautions;Weight bearing status;Other (comment) (AMS)  Activity Tolerance: Patient limited by lethargy (limited due to apparent confusion) Patient left: in chair;with call bell/phone within reach;with family/visitor present   Time: 1116-1150 OT Time Calculation (min): 34 min Charges:  OT General Charges $OT Visit: 1 Procedure OT Evaluation $Initial OT Evaluation Tier I: 1 Procedure OT Treatments $Self Care/Home Management : 8-22 mins G-Codes:    Zanya Lindo,HILLARY 2014/10/19, 12:13 PM   [facility], OTR/L  [phone] 10/19/14

## 2014-10-11 NOTE — Clinical Documentation Improvement (Signed)
Brief operative note states right shoulder rotator cuff tear arthropathy; Right RSA #12 stem, +9 poly, 38 eccentric glenosphere; (full op note not yet available).  H&P contains the following:  chief complaint as "Left rotator cuff arthropathy":   HPI: "the patient is a 81 y.o. Female with end stage right shoulder rotator cuff tear arthropathy.":   Impression: right shoulder rotator cuff tear arthropathy";   "Plan of action: Procedure(s): Right reverse shoulder arthroplasty".    Please clarify the conflicting information regarding laterality documentation in the H&P.    Thank you, Mateo Flow, RN [phone] Clinical Documentation Specialist

## 2014-10-11 NOTE — Progress Notes (Signed)
Patient ID: Marissa Ryan, female   DOB: [birth year], 81 y.o.   MRN: [id number] Subjective: 1 Day Post-Op Procedure(s) (LRB): RIGHT REVERSE SHOULDER ARTHROPLASTY (Right)    Patient reports pain as moderate this am.    Objective:   VITALS:   Filed Vitals:   10/11/14 0500  BP: 150/58  Pulse: 99  Temp: 100.5 F (38.1 C)  Resp: 16    Neurovascular intact Incision: dressing C/D/I, right upper extremity  LABS No results for input(s): HGB, HCT, WBC, PLT in the last 72 hours.  No results for input(s): NA, K, BUN, CREATININE, GLUCOSE in the last 72 hours.  No results for input(s): LABPT, INR in the last 72 hours.   Assessment/Plan: 1 Day Post-Op Procedure(s) (LRB): RIGHT REVERSE SHOULDER ARTHROPLASTY (Right)   Advance diet Up with therapy Plan for discharge tomorrow per Dr. Onnie Graham Pending therapy assessment and work with mobilization

## 2014-10-11 NOTE — Evaluation (Signed)
Physical Therapy Evaluation Patient Details Name: Marissa Ryan MRN: [id number] DOB: 16-Feb-1925 Today's Date: 10/11/2014   History of Present Illness  81 y.o. female admitted to [facility] on 10/10/14 for elective R reverse TSA.  Pt with significant PMHx of HTN, DM, anxiety, back surgery x 2, L wrist surgery, eye surgery (cataract and glaucoma), and L THA (02/2014).  Clinical Impression  Pt is POD #1 s/p shoulder surgery and is significantly different from baseline for both cognition and mobility.  Per family, she took 3-4 days after her hip surgery in 2015 to come back to her cognitive baseline and still needed to go to SNF for rehab because her husband could not physically care for her.  Unless she gets to a high supervision level of care, I do not feel comfortable sending her home with an elderly husband who is at risk for falling and injuring himself if he tries to prevent her from falling.  She would benefit from SNF for rehab before returning home.  Family is agreeable and husband open to the idea of SNF level rehab and they have gone to [facility] in the past.  PT to follow acutely for deficits listed below.       Follow Up Recommendations SNF    Equipment Recommendations  None recommended by PT    Recommendations for Other Services   NA    Precautions / Restrictions Precautions Precautions: Shoulder Type of Shoulder Precautions: active protocol. A/PROM FF 90; Abd 60; ER 30; pendulums OK; AROM elbow/wrist/hand. Shoulder Interventions: Shoulder sling/immobilizer;At all times;Off for dressing/bathing/exercises Required Braces or Orthoses: Sling Restrictions Weight Bearing Restrictions: Yes RUE Weight Bearing: Non weight bearing      Mobility  Bed Mobility Overal bed mobility: Needs Assistance;+2 for physical assistance Bed Mobility: Sit to Supine       Sit to supine: +2 for physical assistance;Max assist   General bed mobility comments: Two person max assist to safely get pt back into  bed.  Assist needed at trunk and for bil LE lift.   Transfers Overall transfer level: Needs assistance Equipment used: 1 person hand held assist Transfers: Sit to/from [facility] Sit to Stand: Mod assist;+2 physical assistance Stand pivot transfers: +2 physical assistance;Mod assist       General transfer comment: Pt stood with one person mod assist from recliner chair and pivoted to [facility] with one person mod assist, however, when attempted again after toileting it required two person mod to max assist to stand.  Pt with posterior lean preference and heavy reliance on bil LEs supported against chair and BSC (which moved if not stabilized).    Ambulation/Gait Ambulation/Gait assistance: +2 physical assistance;Max assist Ambulation Distance (Feet): 5 Feet Assistive device: 1 person hand held assist Gait Pattern/deviations: Step-to pattern;Shuffle;Staggering left;Staggering right     General Gait Details: Pt with posterior preference throughout gait.  Two people needed to guard pt and anterior weight shift her to preent posterior LOB during the few steps we took to get back in bed.  Pt could not identify where the bed was and had a hard time hearing cues (even in her left ear) for gait cues.  She is a very high fall risk in her current presentation.       Balance Overall balance assessment: Needs assistance Sitting-balance support: Feet supported;Single extremity supported Sitting balance-Leahy Scale: Poor Sitting balance - Comments: posterior lean Postural control: Posterior lean Standing balance support: Single extremity supported Standing balance-Leahy Scale: Zero Standing balance comment: Up to  zero standing balance due to posterior lean and needing mod up to max assist to stand safely depending on where she was standing from.                              Pertinent Vitals/Pain Pain Assessment: Faces Faces Pain Scale: Hurts even more Pain Location:  righ shoulder Pain Descriptors / Indicators: Grimacing;Guarding Pain Intervention(s): Limited activity within patient's tolerance;Monitored during session;Repositioned    Home Living Family/patient expects to be discharged to:: Private residence Living Arrangements: Spouse/significant other Available Help at Discharge: Family;Available 24 hours/day Type of Home: House Home Access: Stairs to enter Entrance Stairs-Rails: Right Entrance Stairs-Number of Steps: 2 Home Layout: One level Home Equipment: Walker - 2 wheels;Cane - single point;Bedside commode;Grab bars - tub/shower;Hand held shower head;Shower seat      Prior Function Level of Independence: Independent               Hand Dominance   Dominant Hand: Left    Extremity/Trunk Assessment   Upper Extremity Assessment: Defer to OT evaluation           Lower Extremity Assessment: RLE deficits/detail;LLE deficits/detail;Generalized weakness   LLE Deficits / Details: per gross functional assessment equal bil and generally weak.   Cervical / Trunk Assessment: Kyphotic  Communication   Communication: HOH (left ear better)  Cognition Arousal/Alertness: Lethargic Behavior During Therapy: Restless;Agitated Overall Cognitive Status: Impaired/Different from baseline Area of Impairment: Orientation;Memory;Attention;Following commands;Safety/judgement;Awareness;Problem solving Orientation Level: Disoriented to;Place;Time;Situation (recognizes her husband) Current Attention Level: Focused Memory: Decreased recall of precautions;Decreased short-term memory Following Commands: Follows one step commands inconsistently Safety/Judgement: Decreased awareness of safety;Decreased awareness of deficits Awareness: Intellectual Problem Solving: Slow processing;Decreased initiation;Difficulty sequencing;Requires verbal cues;Requires tactile cues General Comments: Pt continues to have significant cognitive difficulties and confusion.   Per family, she was on narcotic pain medication prior to admission and functioned at an independent level.  They report that with her hip surgery last year she had this same reaction as she came out of anesthesia and it took several days for her to come back to her baseline cognition.               Assessment/Plan    PT Assessment Patient needs continued PT services  PT Diagnosis Difficulty walking;Abnormality of gait;Generalized weakness;Acute pain   PT Problem List Decreased strength;Decreased activity tolerance;Decreased balance;Decreased mobility;Decreased knowledge of use of DME;Decreased safety awareness;Decreased knowledge of precautions;Pain  PT Treatment Interventions DME instruction;Gait training;Stair training;Functional mobility training;Therapeutic activities;Therapeutic exercise;Neuromuscular re-education;Balance training;Patient/family education;Cognitive remediation;Modalities   PT Goals (Current goals can be found in the Care Plan section) Acute Rehab PT Goals Patient Stated Goal: pt unable to state, but family wants her further recovered before going home with husband even if this means short term rehab at a SNF.  PT Goal Formulation: With family Time For Goal Achievement: 10/18/14 Potential to Achieve Goals: Good    Frequency Min 5X/week   Barriers to discharge Decreased caregiver support husband is 66 and will be unable to physically care for her for risk in injuring himself.        End of Session Equipment Utilized During Treatment: Other (comment) (right arm sling) Activity Tolerance: Patient limited by lethargy;Other (comment) (limited by cognitive status) Patient left: in bed;with call bell/phone within reach;with bed alarm set;with family/visitor present Nurse Communication: Mobility status         Time: 1884-1660 PT Time Calculation (min) (ACUTE ONLY): 33 min  Charges:   PT Evaluation $Initial PT Evaluation Tier I: 1 Procedure PT  Treatments $Therapeutic Activity: 8-22 mins        Margy Sumler B. Crumpler, [place], DPT [phone]   10/11/2014, 5:10 PM

## 2014-10-12 LAB — URINE MICROSCOPIC-ADD ON

## 2014-10-12 LAB — URINALYSIS, ROUTINE W REFLEX MICROSCOPIC
BILIRUBIN URINE: NEGATIVE
GLUCOSE, UA: NEGATIVE mg/dL
KETONES UR: NEGATIVE mg/dL
NITRITE: POSITIVE — AB
Protein, ur: NEGATIVE mg/dL
Specific Gravity, Urine: 1.01 (ref 1.005–1.030)
UROBILINOGEN UA: 0.2 mg/dL (ref 0.0–1.0)
pH: 6 (ref 5.0–8.0)

## 2014-10-12 NOTE — Progress Notes (Signed)
Physical Therapy Treatment Patient Details Name: Marissa Ryan MRN: [id number] DOB: Jan 26, 1925 Today's Date: 10/12/2014    History of Present Illness 81 y.o. female admitted to [facility] on 10/10/14 for elective R reverse TSA.  Pt with significant PMHx of HTN, DM, anxiety, back surgery x 2, L wrist surgery, eye surgery (cataract and glaucoma), and L THA (02/2014).    PT Comments    Pt more alert this session. Pt with non working hearing aides so there was some communication barrier throughout session. Pt adamant on using handheld (A) vs cane at this time. Discussed rehab goals with family. Pt will need to be supervision for mobility prior to D/C home with husband, who ambulates with cane himself. Patient needs to practice stairs next session prior to D/C home.   Follow Up Recommendations  Home health PT;Other (comment) (pending tomorrow's progress with cane)     Equipment Recommendations  Cane;Other (comment) (will assess next session)    Recommendations for Other Services       Precautions / Restrictions Precautions Precautions: Shoulder Type of Shoulder Precautions: active protocol. A/PROM FF 90; Abd 60; ER 30; pendulums OK; AROM elbow/wrist/hand. Shoulder Interventions: Shoulder sling/immobilizer;At all times;Off for dressing/bathing/exercises Precaution Comments: OT gave handout yesterday; handout in room Required Braces or Orthoses: Sling Restrictions Weight Bearing Restrictions: Yes RUE Weight Bearing: Non weight bearing    Mobility  Bed Mobility Overal bed mobility: Needs Assistance Bed Mobility: Supine to Sit     Supine to sit: Min assist;HOB elevated     General bed mobility comments: heavily relying on handrail to pull up to sit; min cueing for NWB through Rt LE; daughter reports pt can sleep in recliner  Transfers Overall transfer level: Needs assistance Equipment used: 1 person hand held assist Transfers: Sit to/from Stand Sit to Stand: Min assist          General transfer comment: pt refusing to practice with cane; ;prefers handheld (A) at this time; min (A) to balance ; cues for safety   Ambulation/Gait Ambulation/Gait assistance: Min assist Ambulation Distance (Feet): 30 Feet Assistive device: 1 person hand held assist Gait Pattern/deviations: Step-through pattern;Decreased stride length;Scissoring;Staggering right Gait velocity: decr Gait velocity interpretation: Below normal speed for age/gender General Gait Details: pt requiring handheld (A) with use of gt belt to balance; cues for safety and widen BOS for balance    Stairs            Wheelchair Mobility    Modified Rankin (Stroke Patients Only)       Balance Overall balance assessment: Needs assistance Sitting-balance support: Feet supported;No upper extremity supported Sitting balance-Leahy Scale: Fair     Standing balance support: During functional activity;Single extremity supported Standing balance-Leahy Scale: Poor Standing balance comment: handheld (A) in Lt UE to balance                    Cognition Arousal/Alertness: Awake/alert Behavior During Therapy: Anxious Overall Cognitive Status: Impaired/Different from baseline Area of Impairment: Memory;Following commands     Memory: Decreased short-term memory Following Commands: Follows one step commands with increased time Safety/Judgement: Decreased awareness of safety;Decreased awareness of deficits     General Comments: pt does not have working hearing aides at this time    Exercises      General Comments General comments (skin integrity, edema, etc.): discussed rehab goals for D/C home with pt, husband and daughter      Pertinent Vitals/Pain Pain Assessment: Faces Faces Pain Scale: Hurts little more Pain  Location: Rt shoulder Pain Descriptors / Indicators: Grimacing Pain Intervention(s): Monitored during session;Premedicated before session;Repositioned;Ice applied    Home Living                       Prior Function            PT Goals (current goals can now be found in the care plan section) Acute Rehab PT Goals Patient Stated Goal: to walk in room only  PT Goal Formulation: With patient/family Time For Goal Achievement: 10/18/14 Potential to Achieve Goals: Good Progress towards PT goals: Progressing toward goals    Frequency  Min 5X/week    PT Plan Discharge plan needs to be updated    Co-evaluation             End of Session Equipment Utilized During Treatment: Other (comment) (sling) Activity Tolerance: Patient tolerated treatment well Patient left: in chair;with call bell/phone within reach;with family/visitor present     Time: 9379-0240 PT Time Calculation (min) (ACUTE ONLY): 18 min  Charges:  $Gait Training: 8-22 mins                    G CodesGustavus Bryant  PT   [phone]  10/12/2014, 1:49 PM

## 2014-10-12 NOTE — Discharge Summary (Signed)
PATIENT ID:      Marissa Ryan  MRN:     [id number] DOB/AGE:    [birth year] / 81 y.o.     DISCHARGE SUMMARY  ADMISSION DATE:    10/10/2014 DISCHARGE DATE:    ADMISSION DIAGNOSIS: Right ROTATOR CUFF ARTHROPATHY Past Medical History  Diagnosis Date  . Osteoporosis   . Hypertension   . Arthritis   . Gallstones   . Hyperlipidemia   . IBS (irritable bowel syndrome)   . Diabetes mellitus without complication   . Cancer     hx of skin cancer removal on hand   . Anxiety     DISCHARGE DIAGNOSIS:   Active Problems:   S/p reverse total shoulder arthroplasty   PROCEDURE: Procedure(s): RIGHT REVERSE SHOULDER ARTHROPLASTY on 10/10/2014  CONSULTS:     HISTORY:  See H&P in chart.  HOSPITAL COURSE:  Marissa Ryan is a 81 y.o. admitted on 10/10/2014 with a chief complaint of severe right shoulder pain and dysfunction , and found to have a diagnosis of [place].  They were brought to the operating room on 10/10/2014 and underwent Procedure(s): RIGHT REVERSE SHOULDER ARTHROPLASTY.    They were given perioperative antibiotics: Anti-infectives    Start     Dose/Rate Route Frequency Ordered Stop   10/10/14 1400  clindamycin (CLEOCIN) IVPB 600 mg     600 mg 100 mL/hr over 30 Minutes Intravenous Every 6 hours 10/10/14 1350 10/11/14 0249   10/10/14 0600  clindamycin (CLEOCIN) IVPB 900 mg     900 mg 100 mL/hr over 30 Minutes Intravenous On call to O.R. 10/09/14 1430 10/10/14 1052    .  Patient underwent the above named procedure and tolerated it well. The following day they were hemodynamically stable however pain was poorly controlled on oral analgesics. She was kept an additional day for pain control and she had difficult time with mobility. They were neurovascularly intact to the operative extremity. OT/PT was ordered and worked with patient per protocol. They were medically and orthopaedically stable for discharge on day 2 pending OT/PT reeval . Home health was  arranged.    DIAGNOSTIC STUDIES:  RECENT RADIOGRAPHIC STUDIES :  No results found.  RECENT VITAL SIGNS:  Patient Vitals for the past 24 hrs:  BP Temp Temp src Pulse Resp SpO2  10/12/14 0531 (!) 147/58 mmHg 99.1 F (37.3 C) Axillary 86 - 93 %  10/11/14 2248 - 99.7 F (37.6 C) Axillary - - -  10/11/14 2026 (!) 157/57 mmHg (!) 101.4 F (38.6 C) Oral 99 - 93 %  10/11/14 1739 (!) 146/60 mmHg 98.9 F (37.2 C) - 88 16 96 %  .  RECENT EKG RESULTS:    Orders placed or performed in visit on 12/03/13  . EKG 12-Lead    DISCHARGE INSTRUCTIONS:    DISCHARGE MEDICATIONS:     Medication List    STOP taking these medications        rivaroxaban 10 MG Tabs tablet  Commonly known as:  XARELTO      TAKE these medications        acetaminophen 325 MG tablet  Commonly known as:  TYLENOL  Take 2 tablets (650 mg total) by mouth every 6 (six) hours as needed for mild pain (or Fever >/= 101).     ALPRAZolam 0.25 MG tablet  Commonly known as:  XANAX  Take 0.25-0.5 mg by mouth at bedtime as needed for sleep.     aspirin EC 81 MG  tablet  Take 81 mg by mouth daily.     bisacodyl 10 MG suppository  Commonly known as:  DULCOLAX  Place 1 suppository (10 mg total) rectally daily as needed for mild constipation or moderate constipation.     Cinnamon 500 MG capsule  Take 500 mg by mouth daily.     cyclobenzaprine 10 MG tablet  Commonly known as:  FLEXERIL  Take 1 tablet (10 mg total) by mouth 3 (three) times daily as needed for muscle spasms.     diazepam 5 MG tablet  Commonly known as:  VALIUM  Take 0.5-1 tablets (2.5-5 mg total) by mouth every 6 (six) hours as needed for muscle spasms or sedation.     DSS 100 MG Caps  Take 100 mg by mouth 2 (two) times daily.     ezetimibe-simvastatin 10-40 MG per tablet  Commonly known as:  VYTORIN  Take 0.5 tablets by mouth at bedtime.     Fish Oil 1000 MG Caps  Take 1 capsule by mouth daily.     gabapentin 300 MG capsule  Commonly known  as:  NEURONTIN  Take 300 mg by mouth 4 (four) times daily.     HYDROmorphone 2 MG tablet  Commonly known as:  DILAUDID  Take 1 tablet (2 mg total) by mouth every 4 (four) hours as needed for moderate pain or severe pain.     losartan 50 MG tablet  Commonly known as:  COZAAR  Take 50 mg by mouth daily with supper.     metFORMIN 500 MG tablet  Commonly known as:  GLUCOPHAGE  Take 500 mg by mouth daily with breakfast.     metoCLOPramide 5 MG tablet  Commonly known as:  REGLAN  Take 1-2 tablets (5-10 mg total) by mouth every 8 (eight) hours as needed for nausea (if ondansetron (ZOFRAN) ineffective.).     ondansetron 4 MG tablet  Commonly known as:  ZOFRAN  Take 1 tablet (4 mg total) by mouth every 6 (six) hours as needed for nausea.     oxyCODONE 5 MG immediate release tablet  Commonly known as:  Oxy IR/ROXICODONE  Take 1-2 tablets (5-10 mg total) by mouth every 4 (four) hours as needed for moderate pain or severe pain.     polyethylene glycol packet  Commonly known as:  MIRALAX / GLYCOLAX  Take 17 g by mouth daily as needed for mild constipation.     senna 8.6 MG Tabs tablet  Commonly known as:  SENOKOT  Take 1 tablet by mouth daily as needed for mild constipation.     sertraline 50 MG tablet  Commonly known as:  ZOLOFT  Take 50 mg by mouth daily.     traMADol 50 MG tablet  Commonly known as:  ULTRAM  Take 1-2 tablets (50-100 mg total) by mouth every 6 (six) hours as needed (mild pain).     traMADol 50 MG tablet  Commonly known as:  ULTRAM  Take 1 tablet (50 mg total) by mouth every 6 (six) hours as needed (mild pain).     Vitamin D (Ergocalciferol) 50000 UNITS Caps capsule  Commonly known as:  DRISDOL  Take 50,000 Units by mouth every 7 (seven) days.        FOLLOW UP VISIT:       Follow-up Information    Follow up with Marin Shutter, MD.   Specialty:  Orthopedic Surgery   Why:  call to be seen in 10-14 days   Contact information:  [address] [place] [address] [phone]       DISCHARGE TO: Home  DISPOSITION: Good  DISCHARGE CONDITION:  Marissa Ryan for Dr. Justice Britain 10/12/2014, 10:43 AM

## 2014-10-12 NOTE — Progress Notes (Signed)
Occupational Therapy Progress Note  Pt continues to improve.  She is able to perform functional mobility with min guard assist with SPC. Her husband was able to safely assist her to the BR.  He states he is feeling much more confident with his ability to assist her and with plan for home discharge.   Will see again tomorrow.  Recommend HHOT, HHPT, and HHaide at discharge.     10/12/14 1700  OT Visit Information  Last OT Received On 10/12/14  Assistance Needed +1  History of Present Illness 81 y.o. female admitted to [facility] on 10/10/14 for elective R reverse TSA.  Pt with significant PMHx of HTN, DM, anxiety, back surgery x 2, L wrist surgery, eye surgery (cataract and glaucoma), and L THA (02/2014).  OT Time Calculation  OT Start Time (ACUTE ONLY) 1705  OT Stop Time (ACUTE ONLY) 1733  OT Time Calculation (min) 28 min  Precautions  Precautions Shoulder  Type of Shoulder Precautions active protocol. A/PROM FF 90; Abd 60; ER 30; pendulums OK; AROM elbow/wrist/hand.  Shoulder Interventions Shoulder sling/immobilizer;At all times;Off for dressing/bathing/exercises  Precaution Comments Spoke with Jenetta Loges, PA.  She reports she would like pt to incorporate Rt UE into ADLs to increase ROM.  Pt is able to use Rt UE to reposition in the bed, but to avoid full weight bearing through Rt UE to push up into standing   Required Braces or Orthoses Sling  Pain Assessment  Pain Assessment Faces  Faces Pain Scale 2  Pain Location Rt shoulder   Pain Descriptors / Indicators Aching  Pain Intervention(s) Monitored during session;Ice applied  Cognition  Arousal/Alertness Awake/alert  Behavior During Therapy WFL for tasks assessed/performed  Overall Cognitive Status Difficult to assess  Difficult to assess due to Hard of hearing/deaf  ADL  Grooming Brushing hair;Wash/dry hands;Min guard;Standing  Armed forces technical officer Min guard;Ambulation;Comfort height toilet;Grab bars (SPC)  Toileting- Clothing Manipulation  and Hygiene Minimal assistance;Sit to/from stand  Toileting - Clothing Manipulation Details (indicate cue type and reason) Pt's spouse able to assist   Balance  Overall balance assessment Needs assistance  Sitting-balance support Feet supported  Sitting balance-Leahy Scale Good  Standing balance support During functional activity  Standing balance-Leahy Scale Fair  Restrictions  Weight Bearing Restrictions Yes  RUE Weight Bearing NWB  Transfers  Overall transfer level Needs assistance  Equipment used Straight cane  Transfers Sit to/from Stand;Stand Pivot Transfers  Sit to Stand Min guard  Stand pivot transfers Min guard  General transfer comment Pt significantly improved.  min gaurd assist for balance   OT - End of Session  Equipment Utilized During Treatment Other (comment) (sling)  Activity Tolerance Patient tolerated treatment well  Patient left in chair;with call bell/phone within reach;with family/visitor present  Nurse Communication Mobility status  OT Assessment/Plan  OT Plan Discharge plan remains appropriate;Equipment recommendations need to be updated  OT Frequency (ACUTE ONLY) Min 3X/week  Follow Up Recommendations No OT follow up;Other (comment);Supervision/Assistance - 24 hour (HHaide )  OT Equipment 3 in 1 bedside comode  OT Goal Progression  Progress towards OT goals Progressing toward goals  OT General Charges  $OT Visit 1 Procedure  OT Treatments  $Self Care/Home Management  8-22 mins  $Therapeutic Activity 8-22 mins  [facility], OTR/L [phone]

## 2014-10-12 NOTE — Progress Notes (Signed)
Occupational Therapy Treatment Patient Details Name: Marissa Ryan MRN: [id number] DOB: [birth year] Today's Date: 10/12/2014    History of present illness 81 y.o. female admitted to [facility] on 10/10/14 for elective R reverse TSA.  Pt with significant PMHx of HTN, DM, anxiety, back surgery x 2, L wrist surgery, eye surgery (cataract and glaucoma), and L THA (02/2014).   OT comments  Pt significantly improved compared to yesterday.  She requires min A for functional mobility with SPC.  She was able to perform/simulate ADLs with min - mod A while incorporating Rt UE.  She performed elbow and shoulder AROM and AAROM with min cues/encouragement.   Will continue to follow. Anticipate she will be able to discharge home with spouse if she continues to improve as she has today.   If she doesn't continue to improve, will need SNF   Follow Up Recommendations  Home health OT;Supervision/Assistance - 24 hour    Equipment Recommendations  3 in 1 bedside comode    Recommendations for Other Services      Precautions / Restrictions Precautions Precautions: Shoulder Type of Shoulder Precautions: active protocol. A/PROM FF 90; Abd 60; ER 30; pendulums OK; AROM elbow/wrist/hand. Shoulder Interventions: Shoulder sling/immobilizer;At all times;Off for dressing/bathing/exercises Precaution Comments: Spoke with Jenetta Loges, PA.  She reports she would like pt to incorporate Rt UE into ADLs to increase ROM.  Pt is able to use Rt UE to reposition in the bed, but to avoid full weight bearing through Rt UE to push up into standing  Required Braces or Orthoses: Sling Restrictions Weight Bearing Restrictions: Yes RUE Weight Bearing: Non weight bearing       Mobility Bed Mobility Overal bed mobility: Needs Assistance Bed Mobility: Supine to Sit     Supine to sit: Min assist;HOB elevated     General bed mobility comments: heavily relying on handrail to pull up to sit; min cueing for NWB through Rt LE; daughter  reports pt can sleep in recliner  Transfers Overall transfer level: Needs assistance Equipment used: Straight cane Transfers: Sit to/from [facility] Sit to Stand: Min assist Stand pivot transfers: Min assist       General transfer comment: Pt unable to hear cues for sequencing.  Min A for balance     Balance Overall balance assessment: Needs assistance Sitting-balance support: Feet supported Sitting balance-Leahy Scale: Good     Standing balance support: During functional activity Standing balance-Leahy Scale: Poor Standing balance comment: handheld (A) in Lt UE to balance                   ADL Overall ADL's : Needs assistance/impaired                 Upper Body Dressing : Moderate assistance   Lower Body Dressing: Moderate assistance;Sit to/from stand   Toilet Transfer: Minimal assistance;Ambulation;Comfort height toilet;BSC (SPC)   Toileting- Clothing Manipulation and Hygiene: Moderate assistance;Sit to/from stand       Functional mobility during ADLs: Minimal assistance;Cane General ADL Comments: Pts spouse able to don/doff sling with min A.  She was able to don/doff socks with mod A.   She donned shirt with mod A, and simulated bathing Lt UE with min A.   Pt able to incorporate Rt UE into ADLs.  She is Lt hand dominant.         Vision  Perception     Praxis      Cognition   Behavior During Therapy: Anxious Overall Cognitive Status: Difficult to assess Area of Impairment: Memory;Following commands     Memory: Decreased short-term memory  Following Commands: Follows one step commands with increased time Safety/Judgement: Decreased awareness of safety;Decreased awareness of deficits     General Comments: Pt's hearing aids are not working well.  She was appropriate with OT this pm.  Able to follow instructions with repetition of info and gestural cues     Extremity/Trunk Assessment                Exercises Shoulder Exercises Shoulder Flexion: AAROM;Right;5 reps;Self ROM (to ~30*) Shoulder ABduction: AAROM;Self ROM;Right;10 reps;Seated (~50*) Elbow Flexion: AROM;10 reps;Seated;Standing Elbow Extension: AROM;Right;10 reps;Seated;Standing Wrist Extension: PROM;Right;10 reps;Seated Digit Composite Flexion: Right;AROM;10 reps;Seated Composite Extension: AROM;Right;10 reps;Seated Donning/doffing shirt without moving shoulder: Moderate assistance Method for sponge bathing under operated UE: Minimal assistance Donning/doffing sling/immobilizer: Minimal assistance (caregiver assisting ) Correct positioning of sling/immobilizer: Minimal assistance ROM for elbow, wrist and digits of operated UE: Minimal assistance Sling wearing schedule (on at all times/off for ADL's): Supervision/safety   Shoulder Instructions Shoulder Instructions Donning/doffing shirt without moving shoulder: Moderate assistance Method for sponge bathing under operated UE: Minimal assistance Donning/doffing sling/immobilizer: Minimal assistance (caregiver assisting ) Correct positioning of sling/immobilizer: Minimal assistance ROM for elbow, wrist and digits of operated UE: Minimal assistance Sling wearing schedule (on at all times/off for ADL's): Supervision/safety     General Comments      Pertinent Vitals/ Pain       Pain Assessment: Faces Faces Pain Scale: Hurts little more Pain Location: Rt shoulder  Pain Descriptors / Indicators: Grimacing;Guarding Pain Intervention(s): Monitored during session;Repositioned;Ice applied  Home Living                                          Prior Functioning/Environment              Frequency Min 3X/week     Progress Toward Goals  OT Goals(current goals can now be found in the care plan section)     Acute Rehab OT Goals Patient Stated Goal: to walk in room only   Plan Discharge plan remains appropriate;Equipment recommendations need to  be updated    Co-evaluation                 End of Session Equipment Utilized During Treatment: Other (comment) (SPC and sling )   Activity Tolerance Patient tolerated treatment well   Patient Left in chair;with call bell/phone within reach;with family/visitor present   Nurse Communication Mobility status        Time: 1345-1417 OT Time Calculation (min): 32 min  Charges:    Lucille Passy M 10/12/2014, 2:30 PM

## 2014-10-12 NOTE — Progress Notes (Signed)
RN spoke with PT and family regarding discharge plans. PT states they would be more comfortable with an extra night in hospital to ensure adequate therapy sessions. Family also expressing desire to stay another night d/t concerns over mobility. Jenetta Loges, PA made aware of situation.

## 2014-10-13 NOTE — Progress Notes (Signed)
Physical Therapy Treatment Patient Details Name: SOLENE HEREFORD MRN: [id number] DOB: 1924-10-06 Today's Date: 10/13/2014    History of Present Illness 81 y.o. female admitted to [facility] on 10/10/14 for elective R reverse TSA.  Pt with significant PMHx of HTN, DM, anxiety, back surgery x 2, L wrist surgery, eye surgery (cataract and glaucoma), and L THA (02/2014).    PT Comments    Session focused on family education in regards to guarding with mobility. Husband able to provide adequate (A) with mobility at this time. Also practiced stair management with husband (A) and son-in-law present. Pt safe to D/C home. Recommend (A) with all mobility upon D/C.   Follow Up Recommendations  Home health PT;Supervision/Assistance - 24 hour     Equipment Recommendations  None recommended by PT    Recommendations for Other Services       Precautions / Restrictions Precautions Precautions: Shoulder Type of Shoulder Precautions: active protocol. A/PROM FF 90; Abd 60; ER 30; pendulums OK; AROM elbow/wrist/hand. Shoulder Interventions: Shoulder sling/immobilizer;At all times;Off for dressing/bathing/exercises Required Braces or Orthoses: Sling Restrictions Weight Bearing Restrictions: Yes RUE Weight Bearing: Non weight bearing    Mobility  Bed Mobility Overal bed mobility: Needs Assistance Bed Mobility: Supine to Sit     Supine to sit: Supervision     General bed mobility comments: utilizing handrail; will sleep in recliner per family; min cues for sequencing  Transfers Overall transfer level: Needs assistance Equipment used: 1 person hand held assist Transfers: Sit to/from Stand Sit to Stand: Min guard         General transfer comment: pt relying on husband for UE support and balance; cues on safety with guarding; much improved from yesterday  Ambulation/Gait Ambulation/Gait assistance: Min guard Ambulation Distance (Feet): 50 Feet Assistive device: 1 person hand held assist Gait  Pattern/deviations: Step-through pattern;Decreased stride length;Staggering left Gait velocity: decr Gait velocity interpretation: Below normal speed for age/gender General Gait Details: pt utilizing UE support from husband to practice safety at home; min cues for guarding technique   Stairs Stairs: Yes Stairs assistance: Min assist Stair Management: No rails;Step to pattern;Forwards (handheld (A)) Number of Stairs: 3 General stair comments: cues for technique and using husband (A) to ensure safe D/C home; son in law also present for family education   Wheelchair Mobility    Modified Rankin (Stroke Patients Only)       Balance Overall balance assessment: Needs assistance;History of Falls Sitting-balance support: Feet supported;No upper extremity supported Sitting balance-Leahy Scale: Good     Standing balance support: During functional activity;Single extremity supported Standing balance-Leahy Scale: Poor Standing balance comment: UE support at all times                    Cognition Arousal/Alertness: Awake/alert Behavior During Therapy: WFL for tasks assessed/performed Overall Cognitive Status: Difficult to assess                 General Comments: hearing aids not working    Exercises      General Comments        Pertinent Vitals/Pain Pain Assessment: No/denies pain Pain Score: 0-No pain    Home Living                      Prior Function            PT Goals (current goals can now be found in the care plan section) Acute Rehab PT Goals Patient Stated Goal: to go  home today PT Goal Formulation: With patient/family Time For Goal Achievement: 10/18/14 Potential to Achieve Goals: Good Progress towards PT goals: Progressing toward goals    Frequency  Min 5X/week    PT Plan Current plan remains appropriate    Co-evaluation             End of Session   Activity Tolerance: Patient tolerated treatment well Patient left:  Other (comment) (bathroom with husband)     Time: 2671-2458 PT Time Calculation (min) (ACUTE ONLY): 13 min  Charges:  $Gait Training: 8-22 mins                    G Codes:      [place], Gregory [phone] 10/13/2014, 11:45 AM

## 2014-10-13 NOTE — Progress Notes (Addendum)
Occupational Therapy Treatment Patient Details Name: Marissa Ryan MRN: [id number] DOB: 02/17/1925 Today's Date: 10/13/2014    History of present illness 81 y.o. female admitted to [facility] on 10/10/14 for elective R reverse TSA.  Pt with significant PMHx of HTN, DM, anxiety, back surgery x 2, L wrist surgery, eye surgery (cataract and glaucoma), and L THA (02/2014).   OT comments  Education provided in session to pt and son-in-law. Continue to recommend [place] upon d/c.   Follow Up Recommendations  Home health OT;Supervision/Assistance - 24 hour [facility])    Equipment Recommendations  3 in 1 bedside comode    Recommendations for Other Services      Precautions / Restrictions Precautions Precautions: Shoulder Type of Shoulder Precautions: active protocol. A/PROM FF 90; Abd 60; ER 30; AROM elbow/wrist/hand. Shoulder Interventions: Shoulder sling/immobilizer;At all times;Off for dressing/bathing/exercises Precaution Comments: Spoke with Jenetta Loges, PA.  She reports she would like pt to incorporate Rt UE into ADLs to increase ROM.  Pt is able to use Rt UE to reposition in the bed, but to avoid full weight bearing through Rt UE to push up into standing  Required Braces or Orthoses: Sling Restrictions Weight Bearing Restrictions: Yes RUE Weight Bearing: Non weight bearing       Mobility Bed Mobility Overal bed mobility: Needs Assistance Bed Mobility: Supine to Sit     Supine to sit: Supervision     General bed mobility comments: cues for NWB when scooting to edge  Transfers Overall transfer level: Needs assistance Equipment used: 1 person hand held assist Transfers: Sit to/from Stand Sit to Stand: Min guard;Supervision            Balance Min guard for ambulation with cane.              ADL Overall ADL's : Needs assistance/impaired                 Upper Body Dressing : Sitting; husband independent in assisting pt and pt able to assist as well (educated on  dressing technique)   Lower Body Dressing:  (husband independent in assisting pt)   Toilet Transfer: Min guard;Ambulation (straight cane; bed)           Functional mobility during ADLs: Min guard (straight cane) General ADL Comments: Spouse assisted pt in getting dressed and donning sling.  Suggested a belt to use around pt for safety.      Vision                     Perception     Praxis      Cognition  Awake/Alert Behavior During Therapy: WFL for tasks assessed/performed Overall Cognitive Status: Difficult to assess due to [place] Comments: hearing aids not working    Extremity/Trunk Assessment               Exercises Other Exercises Other Exercises: Pt performed right elbow flexion/extension; wrist flexion/extension, and digit composite flexion/extension ( approximately 10 reps of each and AROM).  Other Exercises: Pt performed shoulder flexion, external rotation, and abduction (AAROM in supine position)-approximately 10 reps each or so. Explained that performing these in supine will probably be easier. Donning/doffing shirt without moving shoulder: Caregiver independent with task (educated on technique-pt allowed to move arm) Method for sponge bathing under operated UE:  (educated) Donning/doffing sling/immobilizer:  (husband donned it independently and pt assisted as  well) Correct positioning of sling/immobilizer: Caregiver independent with task ROM for elbow, wrist and digits of operated UE: Supervision/safety Sling wearing schedule (on at all times/off for ADL's):  (educated) Positioning of UE while sleeping:  (educated)   Shoulder Instructions Shoulder Instructions Donning/doffing shirt without moving shoulder: Caregiver independent with task (educated on technique-pt allowed to move arm) Method for sponge bathing under operated UE:  (educated) Donning/doffing sling/immobilizer:  (husband donned it independently and pt  assisted as well) Correct positioning of sling/immobilizer: Caregiver independent with task (OT discussed thumb loop-pt did not use) ROM for elbow, wrist and digits of operated UE: Supervision/safety Sling wearing schedule (on at all times/off for ADL's):  (educated) Positioning of UE while sleeping:  (educated)     General Comments      Pertinent Vitals/ Pain       Pain Assessment: 0-10 Pain Score: 8  Pain Location: Rt shoulder Pain Descriptors / Indicators: Grimacing Pain Intervention(s): Monitored during session;Ice applied;Premedicated before session  Home Living                                          Prior Functioning/Environment              Frequency Min 3X/week     Progress Toward Goals  OT Goals(current goals can now be found in the care plan section)  Progress towards OT goals: Progressing toward goals  Acute Rehab OT Goals Patient Stated Goal: go home OT Goal Formulation: With family Time For Goal Achievement: 10/18/14 Potential to Achieve Goals: Good ADL Goals Pt Will Transfer to Toilet: with supervision;ambulating Additional ADL Goal #1: Pt/caregiver will be independent with HEP for RUE. Additional ADL Goal #2: Pt/caregiver will be independent with ADLs including sling management.  Plan Discharge plan needs to be updated    Co-evaluation                 End of Session Equipment Utilized During Treatment: Other (comment) (sling and cane)   Activity Tolerance Patient tolerated treatment well   Patient Left in bed;with family/visitor present   Nurse Communication Mobility status        Time: 2725-3664 OT Time Calculation (min): 32 min  Charges: OT General Charges $OT Visit: 1 Procedure OT Treatments $Self Care/Home Management : 8-22 mins $Therapeutic Exercise: 8-22 mins  Benito Mccreedy OTR/L [phone] 10/13/2014, 12:18 PM

## 2014-10-13 NOTE — Progress Notes (Signed)
Discharge complete: Patient, husband and son - reviewed d/c instructions and prescriptions with additional hand written instructions d/t patient very HOH. Saline lock removed from RUE - canula intact and 2x2 dressing secured. Nursing Tech took patient via w/c to front entrance.

## 2014-10-13 NOTE — Progress Notes (Signed)
   Subjective: 3 Days Post-Op Procedure(s) (LRB): RIGHT REVERSE SHOULDER ARTHROPLASTY (Right) Patient reports pain as mild.   Patient seen in rounds for Dr. Onnie Graham. Patient is well, but has had some minor complaints of pain in the right shoulder, requiring pain medications Patient is ready to go home today with husband.  Objective: Vital signs in last 24 hours: Temp:  [98 F (36.7 C)-98.7 F (37.1 C)] 98.6 F (37 C) (03/27 0550) Pulse Rate:  [82-87] 82 (03/27 0550) Resp:  [16] 16 (03/27 0550) BP: (141-152)/(46-58) 148/58 mmHg (03/27 0550) SpO2:  [93 %-95 %] 95 % (03/27 0550)  Intake/Output from previous day:  Intake/Output Summary (Last 24 hours) at 10/13/14 0722 Last data filed at 10/13/14 0255  Gross per 24 hour  Intake    720 ml  Output      0 ml  Net    720 ml    Intake/Output this shift:    Labs: No results for input(s): HGB in the last 72 hours. No results for input(s): WBC, RBC, HCT, PLT in the last 72 hours. No results for input(s): NA, K, CL, CO2, BUN, CREATININE, GLUCOSE, CALCIUM in the last 72 hours. No results for input(s): LABPT, INR in the last 72 hours.  EXAM: General - Patient is Alert and Appropriate but hard of hearing due to hearing aids not working. Extremity - Neurovascular intact Sensation intact distally Dressing - clean, dry, no drainage Motor Function - intact, moving hand and fingers well on exam.   Assessment/Plan: 3 Days Post-Op Procedure(s) (LRB): RIGHT REVERSE SHOULDER ARTHROPLASTY (Right) Procedure(s) (LRB): RIGHT REVERSE SHOULDER ARTHROPLASTY (Right) Past Medical History  Diagnosis Date  . Osteoporosis   . Hypertension   . Arthritis   . Gallstones   . Hyperlipidemia   . IBS (irritable bowel syndrome)   . Diabetes mellitus without complication   . Cancer     hx of skin cancer removal on hand   . Anxiety    Active Problems:   S/p reverse total shoulder arthroplasty  Estimated body mass index is 25.29 kg/(m^2) as  calculated from the following:   Height as of this encounter: 5\' 5"  (1.651 m).   Weight as of this encounter: 68.947 kg (152 lb). Up with therapy again this morning Discharge home Diet - Cardiac diet and Diabetic diet Follow up - in 2 weeks Activity - up as tolerated Disposition - Home Condition Upon Discharge - Good D/C Meds - See DC Summary  Arlee Muslim, PA-C Orthopaedic Surgery 10/13/2014, 7:22 AM

## 2014-10-14 ENCOUNTER — Encounter (HOSPITAL_COMMUNITY): Payer: Self-pay | Admitting: Orthopedic Surgery

## 2014-10-14 NOTE — Care Management Note (Signed)
CARE MANAGEMENT NOTE 10/14/2014  Patient:  Marissa Ryan, Marissa Ryan   Account Number:  [id number]  Date Initiated:  10/14/2014  Documentation initiated by:  Ricki Miller  Subjective/Objective Assessment:   81 yr old female underwent a right shoulder revision arthroplasty.     Action/Plan:   [facility] will contact patient concerning start tinme for therapy.   Anticipated DC Date:  10/13/2014   Anticipated DC Plan:  [place]  CM consult      [facility] Choice  HOME HEALTH   Choice offered to / List presented to:     DME arranged  NA        [place] arranged  HH-3 OT  HH-2 PT      [place].   Status of service:  Completed, signed off Medicare Important Message given?   (If response is "NO", the following Medicare IM given date fields will be blank) Date Medicare IM given:   Medicare IM given by:   Date Additional Medicare IM given:   Additional Medicare IM given by:    Discharge Disposition:  [place]  Per UR Regulation:  Reviewed for med. necessity/level of care/duration of stay  If discussed at [place] of Stay Meetings, dates discussed:    Comments:

## 2014-10-15 DIAGNOSIS — M810 Age-related osteoporosis without current pathological fracture: Secondary | ICD-10-CM | POA: Diagnosis not present

## 2014-10-15 DIAGNOSIS — E785 Hyperlipidemia, unspecified: Secondary | ICD-10-CM | POA: Diagnosis not present

## 2014-10-15 DIAGNOSIS — E119 Type 2 diabetes mellitus without complications: Secondary | ICD-10-CM | POA: Diagnosis not present

## 2014-10-15 DIAGNOSIS — M1990 Unspecified osteoarthritis, unspecified site: Secondary | ICD-10-CM | POA: Diagnosis not present

## 2014-10-15 DIAGNOSIS — F419 Anxiety disorder, unspecified: Secondary | ICD-10-CM | POA: Diagnosis not present

## 2014-10-15 DIAGNOSIS — I10 Essential (primary) hypertension: Secondary | ICD-10-CM | POA: Diagnosis not present

## 2014-10-15 DIAGNOSIS — Z4889 Encounter for other specified surgical aftercare: Secondary | ICD-10-CM | POA: Diagnosis not present

## 2014-10-16 DIAGNOSIS — E119 Type 2 diabetes mellitus without complications: Secondary | ICD-10-CM | POA: Diagnosis not present

## 2014-10-16 DIAGNOSIS — I10 Essential (primary) hypertension: Secondary | ICD-10-CM | POA: Diagnosis not present

## 2014-10-16 DIAGNOSIS — M810 Age-related osteoporosis without current pathological fracture: Secondary | ICD-10-CM | POA: Diagnosis not present

## 2014-10-16 DIAGNOSIS — F419 Anxiety disorder, unspecified: Secondary | ICD-10-CM | POA: Diagnosis not present

## 2014-10-16 DIAGNOSIS — Z4889 Encounter for other specified surgical aftercare: Secondary | ICD-10-CM | POA: Diagnosis not present

## 2014-10-16 DIAGNOSIS — M1990 Unspecified osteoarthritis, unspecified site: Secondary | ICD-10-CM | POA: Diagnosis not present

## 2014-10-17 ENCOUNTER — Ambulatory Visit (HOSPITAL_COMMUNITY): Payer: Medicare Other

## 2014-10-18 DIAGNOSIS — Z4889 Encounter for other specified surgical aftercare: Secondary | ICD-10-CM | POA: Diagnosis not present

## 2014-10-18 DIAGNOSIS — M810 Age-related osteoporosis without current pathological fracture: Secondary | ICD-10-CM | POA: Diagnosis not present

## 2014-10-18 DIAGNOSIS — E119 Type 2 diabetes mellitus without complications: Secondary | ICD-10-CM | POA: Diagnosis not present

## 2014-10-18 DIAGNOSIS — M1990 Unspecified osteoarthritis, unspecified site: Secondary | ICD-10-CM | POA: Diagnosis not present

## 2014-10-18 DIAGNOSIS — I10 Essential (primary) hypertension: Secondary | ICD-10-CM | POA: Diagnosis not present

## 2014-10-18 DIAGNOSIS — F419 Anxiety disorder, unspecified: Secondary | ICD-10-CM | POA: Diagnosis not present

## 2014-10-21 DIAGNOSIS — M1990 Unspecified osteoarthritis, unspecified site: Secondary | ICD-10-CM | POA: Diagnosis not present

## 2014-10-21 DIAGNOSIS — I10 Essential (primary) hypertension: Secondary | ICD-10-CM | POA: Diagnosis not present

## 2014-10-21 DIAGNOSIS — M810 Age-related osteoporosis without current pathological fracture: Secondary | ICD-10-CM | POA: Diagnosis not present

## 2014-10-21 DIAGNOSIS — Z4889 Encounter for other specified surgical aftercare: Secondary | ICD-10-CM | POA: Diagnosis not present

## 2014-10-21 DIAGNOSIS — E119 Type 2 diabetes mellitus without complications: Secondary | ICD-10-CM | POA: Diagnosis not present

## 2014-10-21 DIAGNOSIS — F419 Anxiety disorder, unspecified: Secondary | ICD-10-CM | POA: Diagnosis not present

## 2014-10-22 DIAGNOSIS — M1990 Unspecified osteoarthritis, unspecified site: Secondary | ICD-10-CM | POA: Diagnosis not present

## 2014-10-22 DIAGNOSIS — F419 Anxiety disorder, unspecified: Secondary | ICD-10-CM | POA: Diagnosis not present

## 2014-10-22 DIAGNOSIS — M810 Age-related osteoporosis without current pathological fracture: Secondary | ICD-10-CM | POA: Diagnosis not present

## 2014-10-22 DIAGNOSIS — Z4889 Encounter for other specified surgical aftercare: Secondary | ICD-10-CM | POA: Diagnosis not present

## 2014-10-22 DIAGNOSIS — E119 Type 2 diabetes mellitus without complications: Secondary | ICD-10-CM | POA: Diagnosis not present

## 2014-10-22 DIAGNOSIS — I10 Essential (primary) hypertension: Secondary | ICD-10-CM | POA: Diagnosis not present

## 2014-10-23 DIAGNOSIS — Z96611 Presence of right artificial shoulder joint: Secondary | ICD-10-CM | POA: Diagnosis not present

## 2014-10-23 DIAGNOSIS — Z471 Aftercare following joint replacement surgery: Secondary | ICD-10-CM | POA: Diagnosis not present

## 2014-10-25 DIAGNOSIS — M1990 Unspecified osteoarthritis, unspecified site: Secondary | ICD-10-CM | POA: Diagnosis not present

## 2014-10-25 DIAGNOSIS — Z4889 Encounter for other specified surgical aftercare: Secondary | ICD-10-CM | POA: Diagnosis not present

## 2014-10-25 DIAGNOSIS — E119 Type 2 diabetes mellitus without complications: Secondary | ICD-10-CM | POA: Diagnosis not present

## 2014-10-25 DIAGNOSIS — F419 Anxiety disorder, unspecified: Secondary | ICD-10-CM | POA: Diagnosis not present

## 2014-10-25 DIAGNOSIS — I10 Essential (primary) hypertension: Secondary | ICD-10-CM | POA: Diagnosis not present

## 2014-10-25 DIAGNOSIS — M810 Age-related osteoporosis without current pathological fracture: Secondary | ICD-10-CM | POA: Diagnosis not present

## 2014-10-28 DIAGNOSIS — I10 Essential (primary) hypertension: Secondary | ICD-10-CM | POA: Diagnosis not present

## 2014-10-28 DIAGNOSIS — Z4889 Encounter for other specified surgical aftercare: Secondary | ICD-10-CM | POA: Diagnosis not present

## 2014-10-28 DIAGNOSIS — M1990 Unspecified osteoarthritis, unspecified site: Secondary | ICD-10-CM | POA: Diagnosis not present

## 2014-10-28 DIAGNOSIS — M810 Age-related osteoporosis without current pathological fracture: Secondary | ICD-10-CM | POA: Diagnosis not present

## 2014-10-28 DIAGNOSIS — F419 Anxiety disorder, unspecified: Secondary | ICD-10-CM | POA: Diagnosis not present

## 2014-10-28 DIAGNOSIS — E119 Type 2 diabetes mellitus without complications: Secondary | ICD-10-CM | POA: Diagnosis not present

## 2014-10-31 DIAGNOSIS — M1990 Unspecified osteoarthritis, unspecified site: Secondary | ICD-10-CM | POA: Diagnosis not present

## 2014-10-31 DIAGNOSIS — I10 Essential (primary) hypertension: Secondary | ICD-10-CM | POA: Diagnosis not present

## 2014-10-31 DIAGNOSIS — Z4889 Encounter for other specified surgical aftercare: Secondary | ICD-10-CM | POA: Diagnosis not present

## 2014-10-31 DIAGNOSIS — E119 Type 2 diabetes mellitus without complications: Secondary | ICD-10-CM | POA: Diagnosis not present

## 2014-10-31 DIAGNOSIS — M810 Age-related osteoporosis without current pathological fracture: Secondary | ICD-10-CM | POA: Diagnosis not present

## 2014-10-31 DIAGNOSIS — F419 Anxiety disorder, unspecified: Secondary | ICD-10-CM | POA: Diagnosis not present

## 2014-11-04 DIAGNOSIS — M1990 Unspecified osteoarthritis, unspecified site: Secondary | ICD-10-CM | POA: Diagnosis not present

## 2014-11-04 DIAGNOSIS — Z4889 Encounter for other specified surgical aftercare: Secondary | ICD-10-CM | POA: Diagnosis not present

## 2014-11-04 DIAGNOSIS — M810 Age-related osteoporosis without current pathological fracture: Secondary | ICD-10-CM | POA: Diagnosis not present

## 2014-11-04 DIAGNOSIS — E119 Type 2 diabetes mellitus without complications: Secondary | ICD-10-CM | POA: Diagnosis not present

## 2014-11-04 DIAGNOSIS — F419 Anxiety disorder, unspecified: Secondary | ICD-10-CM | POA: Diagnosis not present

## 2014-11-04 DIAGNOSIS — I10 Essential (primary) hypertension: Secondary | ICD-10-CM | POA: Diagnosis not present

## 2014-11-07 DIAGNOSIS — M1990 Unspecified osteoarthritis, unspecified site: Secondary | ICD-10-CM | POA: Diagnosis not present

## 2014-11-07 DIAGNOSIS — E119 Type 2 diabetes mellitus without complications: Secondary | ICD-10-CM | POA: Diagnosis not present

## 2014-11-07 DIAGNOSIS — F419 Anxiety disorder, unspecified: Secondary | ICD-10-CM | POA: Diagnosis not present

## 2014-11-07 DIAGNOSIS — I10 Essential (primary) hypertension: Secondary | ICD-10-CM | POA: Diagnosis not present

## 2014-11-07 DIAGNOSIS — M810 Age-related osteoporosis without current pathological fracture: Secondary | ICD-10-CM | POA: Diagnosis not present

## 2014-11-07 DIAGNOSIS — Z4889 Encounter for other specified surgical aftercare: Secondary | ICD-10-CM | POA: Diagnosis not present

## 2014-11-11 DIAGNOSIS — M810 Age-related osteoporosis without current pathological fracture: Secondary | ICD-10-CM | POA: Diagnosis not present

## 2014-11-11 DIAGNOSIS — Z4889 Encounter for other specified surgical aftercare: Secondary | ICD-10-CM | POA: Diagnosis not present

## 2014-11-11 DIAGNOSIS — E119 Type 2 diabetes mellitus without complications: Secondary | ICD-10-CM | POA: Diagnosis not present

## 2014-11-11 DIAGNOSIS — M1990 Unspecified osteoarthritis, unspecified site: Secondary | ICD-10-CM | POA: Diagnosis not present

## 2014-11-11 DIAGNOSIS — F419 Anxiety disorder, unspecified: Secondary | ICD-10-CM | POA: Diagnosis not present

## 2014-11-11 DIAGNOSIS — I10 Essential (primary) hypertension: Secondary | ICD-10-CM | POA: Diagnosis not present

## 2014-11-14 DIAGNOSIS — M1990 Unspecified osteoarthritis, unspecified site: Secondary | ICD-10-CM | POA: Diagnosis not present

## 2014-11-14 DIAGNOSIS — Z4889 Encounter for other specified surgical aftercare: Secondary | ICD-10-CM | POA: Diagnosis not present

## 2014-11-14 DIAGNOSIS — F419 Anxiety disorder, unspecified: Secondary | ICD-10-CM | POA: Diagnosis not present

## 2014-11-14 DIAGNOSIS — M810 Age-related osteoporosis without current pathological fracture: Secondary | ICD-10-CM | POA: Diagnosis not present

## 2014-11-14 DIAGNOSIS — I10 Essential (primary) hypertension: Secondary | ICD-10-CM | POA: Diagnosis not present

## 2014-11-14 DIAGNOSIS — E119 Type 2 diabetes mellitus without complications: Secondary | ICD-10-CM | POA: Diagnosis not present

## 2014-11-18 DIAGNOSIS — I10 Essential (primary) hypertension: Secondary | ICD-10-CM | POA: Diagnosis not present

## 2014-11-18 DIAGNOSIS — Z4889 Encounter for other specified surgical aftercare: Secondary | ICD-10-CM | POA: Diagnosis not present

## 2014-11-18 DIAGNOSIS — F419 Anxiety disorder, unspecified: Secondary | ICD-10-CM | POA: Diagnosis not present

## 2014-11-18 DIAGNOSIS — M1990 Unspecified osteoarthritis, unspecified site: Secondary | ICD-10-CM | POA: Diagnosis not present

## 2014-11-18 DIAGNOSIS — M810 Age-related osteoporosis without current pathological fracture: Secondary | ICD-10-CM | POA: Diagnosis not present

## 2014-11-18 DIAGNOSIS — E119 Type 2 diabetes mellitus without complications: Secondary | ICD-10-CM | POA: Diagnosis not present

## 2014-11-19 DIAGNOSIS — H04123 Dry eye syndrome of bilateral lacrimal glands: Secondary | ICD-10-CM | POA: Diagnosis not present

## 2014-11-19 DIAGNOSIS — Z9849 Cataract extraction status, unspecified eye: Secondary | ICD-10-CM | POA: Diagnosis not present

## 2014-11-19 DIAGNOSIS — H179 Unspecified corneal scar and opacity: Secondary | ICD-10-CM | POA: Diagnosis not present

## 2014-11-19 DIAGNOSIS — H11153 Pinguecula, bilateral: Secondary | ICD-10-CM | POA: Diagnosis not present

## 2014-11-19 DIAGNOSIS — H18413 Arcus senilis, bilateral: Secondary | ICD-10-CM | POA: Diagnosis not present

## 2014-11-19 DIAGNOSIS — H4011X3 Primary open-angle glaucoma, severe stage: Secondary | ICD-10-CM | POA: Diagnosis not present

## 2014-11-19 DIAGNOSIS — Z961 Presence of intraocular lens: Secondary | ICD-10-CM | POA: Diagnosis not present

## 2014-11-19 DIAGNOSIS — Z6827 Body mass index (BMI) 27.0-27.9, adult: Secondary | ICD-10-CM | POA: Diagnosis not present

## 2014-11-19 DIAGNOSIS — M810 Age-related osteoporosis without current pathological fracture: Secondary | ICD-10-CM | POA: Diagnosis not present

## 2014-11-19 DIAGNOSIS — Z79899 Other long term (current) drug therapy: Secondary | ICD-10-CM | POA: Diagnosis not present

## 2014-11-19 DIAGNOSIS — E559 Vitamin D deficiency, unspecified: Secondary | ICD-10-CM | POA: Diagnosis not present

## 2014-11-20 DIAGNOSIS — Z471 Aftercare following joint replacement surgery: Secondary | ICD-10-CM | POA: Diagnosis not present

## 2014-11-20 DIAGNOSIS — Z96611 Presence of right artificial shoulder joint: Secondary | ICD-10-CM | POA: Diagnosis not present

## 2014-11-21 DIAGNOSIS — Z4889 Encounter for other specified surgical aftercare: Secondary | ICD-10-CM | POA: Diagnosis not present

## 2014-11-21 DIAGNOSIS — M810 Age-related osteoporosis without current pathological fracture: Secondary | ICD-10-CM | POA: Diagnosis not present

## 2014-11-21 DIAGNOSIS — F419 Anxiety disorder, unspecified: Secondary | ICD-10-CM | POA: Diagnosis not present

## 2014-11-21 DIAGNOSIS — I10 Essential (primary) hypertension: Secondary | ICD-10-CM | POA: Diagnosis not present

## 2014-11-21 DIAGNOSIS — E119 Type 2 diabetes mellitus without complications: Secondary | ICD-10-CM | POA: Diagnosis not present

## 2014-11-21 DIAGNOSIS — M1990 Unspecified osteoarthritis, unspecified site: Secondary | ICD-10-CM | POA: Diagnosis not present

## 2014-12-02 DIAGNOSIS — Z471 Aftercare following joint replacement surgery: Secondary | ICD-10-CM | POA: Diagnosis not present

## 2014-12-02 DIAGNOSIS — Z96611 Presence of right artificial shoulder joint: Secondary | ICD-10-CM | POA: Diagnosis not present

## 2014-12-02 DIAGNOSIS — S40011A Contusion of right shoulder, initial encounter: Secondary | ICD-10-CM | POA: Diagnosis not present

## 2014-12-09 DIAGNOSIS — Z6827 Body mass index (BMI) 27.0-27.9, adult: Secondary | ICD-10-CM | POA: Diagnosis not present

## 2014-12-09 DIAGNOSIS — M7989 Other specified soft tissue disorders: Secondary | ICD-10-CM | POA: Diagnosis not present

## 2014-12-09 DIAGNOSIS — R609 Edema, unspecified: Secondary | ICD-10-CM | POA: Diagnosis not present

## 2014-12-12 DIAGNOSIS — R609 Edema, unspecified: Secondary | ICD-10-CM | POA: Diagnosis not present

## 2014-12-20 ENCOUNTER — Encounter (HOSPITAL_COMMUNITY): Payer: Self-pay

## 2014-12-20 ENCOUNTER — Ambulatory Visit (HOSPITAL_COMMUNITY)
Admission: RE | Admit: 2014-12-20 | Discharge: 2014-12-20 | Disposition: A | Discharge status: Home / Self Care | Payer: Medicare Other | Source: Ambulatory Visit | Attending: Internal Medicine | Admitting: Internal Medicine

## 2014-12-20 ENCOUNTER — Other Ambulatory Visit (HOSPITAL_COMMUNITY): Payer: Self-pay | Admitting: Internal Medicine

## 2014-12-20 DIAGNOSIS — Z471 Aftercare following joint replacement surgery: Secondary | ICD-10-CM | POA: Diagnosis not present

## 2014-12-20 DIAGNOSIS — Z96611 Presence of right artificial shoulder joint: Secondary | ICD-10-CM | POA: Diagnosis not present

## 2014-12-20 DIAGNOSIS — M810 Age-related osteoporosis without current pathological fracture: Secondary | ICD-10-CM | POA: Diagnosis not present

## 2014-12-20 MED ORDER — ZOLEDRONIC ACID 5 MG/100ML IV SOLN
5.0000 mg | Freq: Once | INTRAVENOUS | Status: AC
  Administered 2014-12-20: 5 mg via INTRAVENOUS
  Filled 2014-12-20: qty 100

## 2014-12-20 MED ORDER — SODIUM CHLORIDE 0.9 % IV SOLN
Freq: Once | INTRAVENOUS | Status: AC
  Administered 2014-12-20: 12:00:00 via INTRAVENOUS

## 2014-12-20 NOTE — Progress Notes (Signed)
Tolerated reclast well w/o immediate rxn.

## 2014-12-20 NOTE — Discharge Instructions (Signed)
Osteoporosis Throughout your life, your body breaks down old bone and replaces it with new bone. As you get older, your body does not replace bone as quickly as it breaks it down. By the age of 37 years, most people begin to gradually lose bone because of the imbalance between bone loss and replacement. Some people lose more bone than others. Bone loss beyond a specified normal degree is considered osteoporosis.  Osteoporosis affects the strength and durability of your bones. The inside of the ends of your bones and your flat bones, like the bones of your pelvis, look like honeycomb, filled with tiny open spaces. As bone loss occurs, your bones become less dense. This means that the open spaces inside your bones become bigger and the walls between these spaces become thinner. This makes your bones weaker. Bones of a person with osteoporosis can become so weak that they can break (fracture) during minor accidents, such as a simple fall. CAUSES  The following factors have been associated with the development of osteoporosis:  Smoking.  Drinking more than 2 alcoholic drinks several days per week.  Long-term use of certain medicines:  Corticosteroids.  Chemotherapy medicines.  Thyroid medicines.  Antiepileptic medicines.  Gonadal hormone suppression medicine.  Immunosuppression medicine.  Being underweight.  Lack of physical activity.  Lack of exposure to the sun. This can lead to vitamin D deficiency.  Certain medical conditions:  Certain inflammatory bowel diseases, such as Crohn disease and ulcerative colitis.  Diabetes.  Hyperthyroidism.  Hyperparathyroidism. RISK FACTORS Anyone can develop osteoporosis. However, the following factors can increase your risk of developing osteoporosis:  Gender--Women are at higher risk than men.  Age--Being older than 50 years increases your risk.  Ethnicity--White and Asian people have an increased risk.  Weight --Being extremely  underweight can increase your risk of osteoporosis.  Family history of osteoporosis--Having a family member who has developed osteoporosis can increase your risk. SYMPTOMS  Usually, people with osteoporosis have no symptoms.  DIAGNOSIS  Signs during a physical exam that may prompt your caregiver to suspect osteoporosis include:  Decreased height. This is usually caused by the compression of the bones that form your spine (vertebrae) because they have weakened and become fractured.  A curving or rounding of the upper back (kyphosis). To confirm signs of osteoporosis, your caregiver may request a procedure that uses 2 low-dose X-ray beams with different levels of energy to measure your bone mineral density (dual-energy X-ray absorptiometry [DXA]). Also, your caregiver may check your level of vitamin D. TREATMENT  The goal of osteoporosis treatment is to strengthen bones in order to decrease the risk of bone fractures. There are different types of medicines available to help achieve this goal. Some of these medicines work by slowing the processes of bone loss. Some medicines work by increasing bone density. Treatment also involves making sure that your levels of calcium and vitamin D are adequate. PREVENTION  There are things you can do to help prevent osteoporosis. Adequate intake of calcium and vitamin D can help you achieve optimal bone mineral density. Regular exercise can also help, especially resistance and weight-bearing activities. If you smoke, quitting smoking is an important part of osteoporosis prevention. MAKE SURE YOU:  Understand these instructions.  Will watch your condition.  Will get help right away if you are not doing well or get worse. FOR MORE INFORMATION www.osteo.org and EquipmentWeekly.com.ee Document Released: 04/14/2005 Document Revised: 10/30/2012 Document Reviewed: 06/19/2011 [facility] Patient Information 2015 [place], [place]. This information is not  intended to replace advice  given to you by your health care provider. Make sure you discuss any questions you have with your health care provider. Zoledronic Acid injection (Paget's Disease, Osteoporosis) What is this medicine? ZOLEDRONIC ACID (ZOE le dron ik AS id) lowers the amount of calcium loss from bone. It is used to treat Paget's disease and osteoporosis in women. This medicine may be used for other purposes; ask your health care provider or pharmacist if you have questions. COMMON BRAND NAME(S): Reclast, Zometa What should I tell my health care provider before I take this medicine? They need to know if you have any of these conditions: -aspirin-sensitive asthma -cancer, especially if you are receiving medicines used to treat cancer -dental disease or wear dentures -infection -kidney disease -low levels of calcium in the blood -past surgery on the parathyroid gland or intestines -receiving corticosteroids like dexamethasone or prednisone -an unusual or allergic reaction to zoledronic acid, other medicines, foods, dyes, or preservatives -pregnant or trying to get pregnant -breast-feeding How should I use this medicine? This medicine is for infusion into a vein. It is given by a health care professional in a hospital or clinic setting. Talk to your pediatrician regarding the use of this medicine in children. This medicine is not approved for use in children. Overdosage: If you think you have taken too much of this medicine contact a poison control center or emergency room at once. NOTE: This medicine is only for you. Do not share this medicine with others. What if I miss a dose? It is important not to miss your dose. Call your doctor or health care professional if you are unable to keep an appointment. What may interact with this medicine? -certain antibiotics given by injection -NSAIDs, medicines for pain and inflammation, like ibuprofen or naproxen -some diuretics like bumetanide,  furosemide -teriparatide This list may not describe all possible interactions. Give your health care provider a list of all the medicines, herbs, non-prescription drugs, or dietary supplements you use. Also tell them if you smoke, drink alcohol, or use illegal drugs. Some items may interact with your medicine. What should I watch for while using this medicine? Visit your doctor or health care professional for regular checkups. It may be some time before you see the benefit from this medicine. Do not stop taking your medicine unless your doctor tells you to. Your doctor may order blood tests or other tests to see how you are doing. Women should inform their doctor if they wish to become pregnant or think they might be pregnant. There is a potential for serious side effects to an unborn child. Talk to your health care professional or pharmacist for more information. You should make sure that you get enough calcium and vitamin D while you are taking this medicine. Discuss the foods you eat and the vitamins you take with your health care professional. Some people who take this medicine have severe bone, joint, and/or muscle pain. This medicine may also increase your risk for jaw problems or a broken thigh bone. Tell your doctor right away if you have severe pain in your jaw, bones, joints, or muscles. Tell your doctor if you have any pain that does not go away or that gets worse. Tell your dentist and dental surgeon that you are taking this medicine. You should not have major dental surgery while on this medicine. See your dentist to have a dental exam and fix any dental problems before starting this medicine. Take good care of  your teeth while on this medicine. Make sure you see your dentist for regular follow-up appointments. What side effects may I notice from receiving this medicine? Side effects that you should report to your doctor or health care professional as soon as possible: -allergic reactions  like skin rash, itching or hives, swelling of the face, lips, or tongue -anxiety, confusion, or depression -breathing problems -changes in vision -eye pain -feeling faint or lightheaded, falls -jaw pain, especially after dental work -mouth sores -muscle cramps, stiffness, or weakness -trouble passing urine or change in the amount of urine Side effects that usually do not require medical attention (report to your doctor or health care professional if they continue or are bothersome): -bone, joint, or muscle pain -constipation -diarrhea -fever -hair loss -irritation at site where injected -loss of appetite -nausea, vomiting -stomach upset -trouble sleeping -trouble swallowing -weak or tired This list may not describe all possible side effects. Call your doctor for medical advice about side effects. You may report side effects to FDA at 1-800-FDA-1088. Where should I keep my medicine? This drug is given in a hospital or clinic and will not be stored at home. NOTE: This sheet is a summary. It may not cover all possible information. If you have questions about this medicine, talk to your doctor, pharmacist, or health care provider.  2015, Elsevier/Gold Standard. (2012-12-18 10:03:48)

## 2014-12-31 DIAGNOSIS — H4011X3 Primary open-angle glaucoma, severe stage: Secondary | ICD-10-CM | POA: Diagnosis not present

## 2014-12-31 DIAGNOSIS — Z961 Presence of intraocular lens: Secondary | ICD-10-CM | POA: Diagnosis not present

## 2014-12-31 DIAGNOSIS — H04123 Dry eye syndrome of bilateral lacrimal glands: Secondary | ICD-10-CM | POA: Diagnosis not present

## 2015-01-06 DIAGNOSIS — M25562 Pain in left knee: Secondary | ICD-10-CM | POA: Diagnosis not present

## 2015-01-06 DIAGNOSIS — M5136 Other intervertebral disc degeneration, lumbar region: Secondary | ICD-10-CM | POA: Diagnosis not present

## 2015-01-06 DIAGNOSIS — M1712 Unilateral primary osteoarthritis, left knee: Secondary | ICD-10-CM | POA: Diagnosis not present

## 2015-01-06 DIAGNOSIS — M961 Postlaminectomy syndrome, not elsewhere classified: Secondary | ICD-10-CM | POA: Diagnosis not present

## 2015-01-14 DIAGNOSIS — I10 Essential (primary) hypertension: Secondary | ICD-10-CM | POA: Diagnosis not present

## 2015-01-14 DIAGNOSIS — E559 Vitamin D deficiency, unspecified: Secondary | ICD-10-CM | POA: Diagnosis not present

## 2015-01-14 DIAGNOSIS — N390 Urinary tract infection, site not specified: Secondary | ICD-10-CM | POA: Diagnosis not present

## 2015-01-14 DIAGNOSIS — R8299 Other abnormal findings in urine: Secondary | ICD-10-CM | POA: Diagnosis not present

## 2015-01-14 DIAGNOSIS — E785 Hyperlipidemia, unspecified: Secondary | ICD-10-CM | POA: Diagnosis not present

## 2015-01-14 DIAGNOSIS — E119 Type 2 diabetes mellitus without complications: Secondary | ICD-10-CM | POA: Diagnosis not present

## 2015-01-22 DIAGNOSIS — M25511 Pain in right shoulder: Secondary | ICD-10-CM | POA: Diagnosis not present

## 2015-01-22 DIAGNOSIS — Z0000 Encounter for general adult medical examination without abnormal findings: Secondary | ICD-10-CM | POA: Diagnosis not present

## 2015-01-22 DIAGNOSIS — M5489 Other dorsalgia: Secondary | ICD-10-CM | POA: Diagnosis not present

## 2015-01-22 DIAGNOSIS — K219 Gastro-esophageal reflux disease without esophagitis: Secondary | ICD-10-CM | POA: Diagnosis not present

## 2015-01-22 DIAGNOSIS — Z1389 Encounter for screening for other disorder: Secondary | ICD-10-CM | POA: Diagnosis not present

## 2015-01-22 DIAGNOSIS — M1612 Unilateral primary osteoarthritis, left hip: Secondary | ICD-10-CM | POA: Diagnosis not present

## 2015-01-22 DIAGNOSIS — E785 Hyperlipidemia, unspecified: Secondary | ICD-10-CM | POA: Diagnosis not present

## 2015-01-22 DIAGNOSIS — R609 Edema, unspecified: Secondary | ICD-10-CM | POA: Diagnosis not present

## 2015-01-22 DIAGNOSIS — Z1231 Encounter for screening mammogram for malignant neoplasm of breast: Secondary | ICD-10-CM | POA: Diagnosis not present

## 2015-01-22 DIAGNOSIS — I10 Essential (primary) hypertension: Secondary | ICD-10-CM | POA: Diagnosis not present

## 2015-01-22 DIAGNOSIS — R809 Proteinuria, unspecified: Secondary | ICD-10-CM | POA: Diagnosis not present

## 2015-01-22 DIAGNOSIS — Z6828 Body mass index (BMI) 28.0-28.9, adult: Secondary | ICD-10-CM | POA: Diagnosis not present

## 2015-01-29 DIAGNOSIS — Z1212 Encounter for screening for malignant neoplasm of rectum: Secondary | ICD-10-CM | POA: Diagnosis not present

## 2015-01-29 DIAGNOSIS — L820 Inflamed seborrheic keratosis: Secondary | ICD-10-CM | POA: Diagnosis not present

## 2015-01-31 DIAGNOSIS — Z96611 Presence of right artificial shoulder joint: Secondary | ICD-10-CM | POA: Diagnosis not present

## 2015-01-31 DIAGNOSIS — Z471 Aftercare following joint replacement surgery: Secondary | ICD-10-CM | POA: Diagnosis not present

## 2015-02-03 DIAGNOSIS — M25551 Pain in right hip: Secondary | ICD-10-CM | POA: Diagnosis not present

## 2015-02-18 DIAGNOSIS — Z961 Presence of intraocular lens: Secondary | ICD-10-CM | POA: Diagnosis not present

## 2015-02-18 DIAGNOSIS — H4011X3 Primary open-angle glaucoma, severe stage: Secondary | ICD-10-CM | POA: Diagnosis not present

## 2015-02-18 DIAGNOSIS — H0015 Chalazion left lower eyelid: Secondary | ICD-10-CM | POA: Diagnosis not present

## 2015-02-18 DIAGNOSIS — H04123 Dry eye syndrome of bilateral lacrimal glands: Secondary | ICD-10-CM | POA: Diagnosis not present

## 2015-03-05 DIAGNOSIS — B351 Tinea unguium: Secondary | ICD-10-CM | POA: Diagnosis not present

## 2015-03-05 DIAGNOSIS — L820 Inflamed seborrheic keratosis: Secondary | ICD-10-CM | POA: Diagnosis not present

## 2015-03-10 DIAGNOSIS — Z471 Aftercare following joint replacement surgery: Secondary | ICD-10-CM | POA: Diagnosis not present

## 2015-03-10 DIAGNOSIS — M5136 Other intervertebral disc degeneration, lumbar region: Secondary | ICD-10-CM | POA: Diagnosis not present

## 2015-03-10 DIAGNOSIS — Z96642 Presence of left artificial hip joint: Secondary | ICD-10-CM | POA: Diagnosis not present

## 2015-03-10 DIAGNOSIS — Z79891 Long term (current) use of opiate analgesic: Secondary | ICD-10-CM | POA: Diagnosis not present

## 2015-03-22 DIAGNOSIS — G5602 Carpal tunnel syndrome, left upper limb: Secondary | ICD-10-CM | POA: Diagnosis not present

## 2015-03-22 DIAGNOSIS — G5601 Carpal tunnel syndrome, right upper limb: Secondary | ICD-10-CM | POA: Diagnosis not present

## 2015-03-31 DIAGNOSIS — G5602 Carpal tunnel syndrome, left upper limb: Secondary | ICD-10-CM | POA: Diagnosis not present

## 2015-03-31 DIAGNOSIS — G5601 Carpal tunnel syndrome, right upper limb: Secondary | ICD-10-CM | POA: Diagnosis not present

## 2015-04-09 DIAGNOSIS — H4011X3 Primary open-angle glaucoma, severe stage: Secondary | ICD-10-CM | POA: Diagnosis not present

## 2015-04-09 DIAGNOSIS — H3530 Unspecified macular degeneration: Secondary | ICD-10-CM | POA: Diagnosis not present

## 2015-04-09 DIAGNOSIS — H04123 Dry eye syndrome of bilateral lacrimal glands: Secondary | ICD-10-CM | POA: Diagnosis not present

## 2015-04-09 DIAGNOSIS — Z961 Presence of intraocular lens: Secondary | ICD-10-CM | POA: Diagnosis not present

## 2015-04-23 ENCOUNTER — Encounter (INDEPENDENT_AMBULATORY_CARE_PROVIDER_SITE_OTHER): Payer: Medicare Other | Admitting: Ophthalmology

## 2015-04-23 DIAGNOSIS — E11319 Type 2 diabetes mellitus with unspecified diabetic retinopathy without macular edema: Secondary | ICD-10-CM | POA: Diagnosis not present

## 2015-04-23 DIAGNOSIS — I10 Essential (primary) hypertension: Secondary | ICD-10-CM | POA: Diagnosis not present

## 2015-04-23 DIAGNOSIS — H35033 Hypertensive retinopathy, bilateral: Secondary | ICD-10-CM | POA: Diagnosis not present

## 2015-04-23 DIAGNOSIS — E113293 Type 2 diabetes mellitus with mild nonproliferative diabetic retinopathy without macular edema, bilateral: Secondary | ICD-10-CM

## 2015-04-23 DIAGNOSIS — H59031 Cystoid macular edema following cataract surgery, right eye: Secondary | ICD-10-CM | POA: Diagnosis not present

## 2015-04-23 DIAGNOSIS — H43811 Vitreous degeneration, right eye: Secondary | ICD-10-CM

## 2015-04-30 ENCOUNTER — Encounter (INDEPENDENT_AMBULATORY_CARE_PROVIDER_SITE_OTHER): Payer: Medicare Other | Admitting: Ophthalmology

## 2015-04-30 DIAGNOSIS — M25562 Pain in left knee: Secondary | ICD-10-CM | POA: Diagnosis not present

## 2015-04-30 DIAGNOSIS — M1712 Unilateral primary osteoarthritis, left knee: Secondary | ICD-10-CM | POA: Diagnosis not present

## 2015-04-30 DIAGNOSIS — M961 Postlaminectomy syndrome, not elsewhere classified: Secondary | ICD-10-CM | POA: Diagnosis not present

## 2015-04-30 DIAGNOSIS — M5136 Other intervertebral disc degeneration, lumbar region: Secondary | ICD-10-CM | POA: Diagnosis not present

## 2015-04-30 DIAGNOSIS — Z79891 Long term (current) use of opiate analgesic: Secondary | ICD-10-CM | POA: Diagnosis not present

## 2015-05-05 DIAGNOSIS — Z96642 Presence of left artificial hip joint: Secondary | ICD-10-CM | POA: Diagnosis not present

## 2015-05-05 DIAGNOSIS — S7002XA Contusion of left hip, initial encounter: Secondary | ICD-10-CM | POA: Diagnosis not present

## 2015-05-05 DIAGNOSIS — Z96611 Presence of right artificial shoulder joint: Secondary | ICD-10-CM | POA: Diagnosis not present

## 2015-05-05 DIAGNOSIS — Z471 Aftercare following joint replacement surgery: Secondary | ICD-10-CM | POA: Diagnosis not present

## 2015-05-08 DIAGNOSIS — G5601 Carpal tunnel syndrome, right upper limb: Secondary | ICD-10-CM | POA: Diagnosis not present

## 2015-05-12 DIAGNOSIS — Z23 Encounter for immunization: Secondary | ICD-10-CM | POA: Diagnosis not present

## 2015-05-16 DIAGNOSIS — Z4789 Encounter for other orthopedic aftercare: Secondary | ICD-10-CM | POA: Diagnosis not present

## 2015-05-16 DIAGNOSIS — M25532 Pain in left wrist: Secondary | ICD-10-CM | POA: Diagnosis not present

## 2015-05-16 DIAGNOSIS — G5601 Carpal tunnel syndrome, right upper limb: Secondary | ICD-10-CM | POA: Diagnosis not present

## 2015-05-21 DIAGNOSIS — E785 Hyperlipidemia, unspecified: Secondary | ICD-10-CM | POA: Diagnosis not present

## 2015-05-21 DIAGNOSIS — Z6828 Body mass index (BMI) 28.0-28.9, adult: Secondary | ICD-10-CM | POA: Diagnosis not present

## 2015-05-21 DIAGNOSIS — M1612 Unilateral primary osteoarthritis, left hip: Secondary | ICD-10-CM | POA: Diagnosis not present

## 2015-05-21 DIAGNOSIS — M810 Age-related osteoporosis without current pathological fracture: Secondary | ICD-10-CM | POA: Diagnosis not present

## 2015-05-21 DIAGNOSIS — I10 Essential (primary) hypertension: Secondary | ICD-10-CM | POA: Diagnosis not present

## 2015-05-21 DIAGNOSIS — E119 Type 2 diabetes mellitus without complications: Secondary | ICD-10-CM | POA: Diagnosis not present

## 2015-05-22 DIAGNOSIS — G5601 Carpal tunnel syndrome, right upper limb: Secondary | ICD-10-CM | POA: Diagnosis not present

## 2015-05-29 DIAGNOSIS — Z471 Aftercare following joint replacement surgery: Secondary | ICD-10-CM | POA: Diagnosis not present

## 2015-05-29 DIAGNOSIS — Z96642 Presence of left artificial hip joint: Secondary | ICD-10-CM | POA: Diagnosis not present

## 2015-06-04 ENCOUNTER — Encounter (INDEPENDENT_AMBULATORY_CARE_PROVIDER_SITE_OTHER): Payer: Medicare Other | Admitting: Ophthalmology

## 2015-06-04 DIAGNOSIS — E11311 Type 2 diabetes mellitus with unspecified diabetic retinopathy with macular edema: Secondary | ICD-10-CM

## 2015-06-04 DIAGNOSIS — H43813 Vitreous degeneration, bilateral: Secondary | ICD-10-CM

## 2015-06-04 DIAGNOSIS — E113211 Type 2 diabetes mellitus with mild nonproliferative diabetic retinopathy with macular edema, right eye: Secondary | ICD-10-CM | POA: Diagnosis not present

## 2015-06-04 DIAGNOSIS — H35342 Macular cyst, hole, or pseudohole, left eye: Secondary | ICD-10-CM | POA: Diagnosis not present

## 2015-06-04 DIAGNOSIS — H59031 Cystoid macular edema following cataract surgery, right eye: Secondary | ICD-10-CM | POA: Diagnosis not present

## 2015-06-04 DIAGNOSIS — I10 Essential (primary) hypertension: Secondary | ICD-10-CM

## 2015-06-04 DIAGNOSIS — H35033 Hypertensive retinopathy, bilateral: Secondary | ICD-10-CM | POA: Diagnosis not present

## 2015-06-04 DIAGNOSIS — E113292 Type 2 diabetes mellitus with mild nonproliferative diabetic retinopathy without macular edema, left eye: Secondary | ICD-10-CM | POA: Diagnosis not present

## 2015-06-05 DIAGNOSIS — G5601 Carpal tunnel syndrome, right upper limb: Secondary | ICD-10-CM | POA: Diagnosis not present

## 2015-06-11 DIAGNOSIS — Z4789 Encounter for other orthopedic aftercare: Secondary | ICD-10-CM | POA: Diagnosis not present

## 2015-06-24 DIAGNOSIS — M5136 Other intervertebral disc degeneration, lumbar region: Secondary | ICD-10-CM | POA: Diagnosis not present

## 2015-07-09 DIAGNOSIS — G8929 Other chronic pain: Secondary | ICD-10-CM | POA: Diagnosis not present

## 2015-07-09 DIAGNOSIS — M961 Postlaminectomy syndrome, not elsewhere classified: Secondary | ICD-10-CM | POA: Diagnosis not present

## 2015-07-09 DIAGNOSIS — M25562 Pain in left knee: Secondary | ICD-10-CM | POA: Diagnosis not present

## 2015-07-09 DIAGNOSIS — M5136 Other intervertebral disc degeneration, lumbar region: Secondary | ICD-10-CM | POA: Diagnosis not present

## 2015-07-24 DIAGNOSIS — G5602 Carpal tunnel syndrome, left upper limb: Secondary | ICD-10-CM | POA: Diagnosis not present

## 2015-07-29 DIAGNOSIS — M961 Postlaminectomy syndrome, not elsewhere classified: Secondary | ICD-10-CM | POA: Diagnosis not present

## 2015-07-29 DIAGNOSIS — M5416 Radiculopathy, lumbar region: Secondary | ICD-10-CM | POA: Diagnosis not present

## 2015-07-31 DIAGNOSIS — Z4789 Encounter for other orthopedic aftercare: Secondary | ICD-10-CM | POA: Diagnosis not present

## 2015-08-06 DIAGNOSIS — R609 Edema, unspecified: Secondary | ICD-10-CM | POA: Diagnosis not present

## 2015-08-06 DIAGNOSIS — Z6828 Body mass index (BMI) 28.0-28.9, adult: Secondary | ICD-10-CM | POA: Diagnosis not present

## 2015-08-06 DIAGNOSIS — Z96642 Presence of left artificial hip joint: Secondary | ICD-10-CM | POA: Diagnosis not present

## 2015-08-07 DIAGNOSIS — G5602 Carpal tunnel syndrome, left upper limb: Secondary | ICD-10-CM | POA: Diagnosis not present

## 2015-08-21 DIAGNOSIS — Z4789 Encounter for other orthopedic aftercare: Secondary | ICD-10-CM | POA: Diagnosis not present

## 2015-08-26 DIAGNOSIS — H11423 Conjunctival edema, bilateral: Secondary | ICD-10-CM | POA: Diagnosis not present

## 2015-08-26 DIAGNOSIS — H3530 Unspecified macular degeneration: Secondary | ICD-10-CM | POA: Diagnosis not present

## 2015-08-26 DIAGNOSIS — H5201 Hypermetropia, right eye: Secondary | ICD-10-CM | POA: Diagnosis not present

## 2015-08-26 DIAGNOSIS — H401133 Primary open-angle glaucoma, bilateral, severe stage: Secondary | ICD-10-CM | POA: Diagnosis not present

## 2015-08-26 DIAGNOSIS — Z961 Presence of intraocular lens: Secondary | ICD-10-CM | POA: Diagnosis not present

## 2015-08-26 DIAGNOSIS — H35351 Cystoid macular degeneration, right eye: Secondary | ICD-10-CM | POA: Diagnosis not present

## 2015-08-26 DIAGNOSIS — H5212 Myopia, left eye: Secondary | ICD-10-CM | POA: Diagnosis not present

## 2015-08-26 DIAGNOSIS — H11153 Pinguecula, bilateral: Secondary | ICD-10-CM | POA: Diagnosis not present

## 2015-08-26 DIAGNOSIS — Z9849 Cataract extraction status, unspecified eye: Secondary | ICD-10-CM | POA: Diagnosis not present

## 2015-08-26 DIAGNOSIS — H35033 Hypertensive retinopathy, bilateral: Secondary | ICD-10-CM | POA: Diagnosis not present

## 2015-08-26 DIAGNOSIS — H04123 Dry eye syndrome of bilateral lacrimal glands: Secondary | ICD-10-CM | POA: Diagnosis not present

## 2015-08-26 DIAGNOSIS — H18413 Arcus senilis, bilateral: Secondary | ICD-10-CM | POA: Diagnosis not present

## 2015-09-10 ENCOUNTER — Ambulatory Visit (INDEPENDENT_AMBULATORY_CARE_PROVIDER_SITE_OTHER): Payer: Medicare Other | Admitting: Ophthalmology

## 2015-09-22 ENCOUNTER — Ambulatory Visit (INDEPENDENT_AMBULATORY_CARE_PROVIDER_SITE_OTHER): Payer: Medicare Other | Admitting: Ophthalmology

## 2015-09-22 DIAGNOSIS — E113211 Type 2 diabetes mellitus with mild nonproliferative diabetic retinopathy with macular edema, right eye: Secondary | ICD-10-CM | POA: Diagnosis not present

## 2015-09-22 DIAGNOSIS — H35342 Macular cyst, hole, or pseudohole, left eye: Secondary | ICD-10-CM | POA: Diagnosis not present

## 2015-09-22 DIAGNOSIS — E784 Other hyperlipidemia: Secondary | ICD-10-CM | POA: Diagnosis not present

## 2015-09-22 DIAGNOSIS — Z6828 Body mass index (BMI) 28.0-28.9, adult: Secondary | ICD-10-CM | POA: Diagnosis not present

## 2015-09-22 DIAGNOSIS — E119 Type 2 diabetes mellitus without complications: Secondary | ICD-10-CM | POA: Diagnosis not present

## 2015-09-22 DIAGNOSIS — H35033 Hypertensive retinopathy, bilateral: Secondary | ICD-10-CM | POA: Diagnosis not present

## 2015-09-22 DIAGNOSIS — Z1389 Encounter for screening for other disorder: Secondary | ICD-10-CM | POA: Diagnosis not present

## 2015-09-22 DIAGNOSIS — I10 Essential (primary) hypertension: Secondary | ICD-10-CM | POA: Diagnosis not present

## 2015-09-22 DIAGNOSIS — E113292 Type 2 diabetes mellitus with mild nonproliferative diabetic retinopathy without macular edema, left eye: Secondary | ICD-10-CM | POA: Diagnosis not present

## 2015-09-22 DIAGNOSIS — E11311 Type 2 diabetes mellitus with unspecified diabetic retinopathy with macular edema: Secondary | ICD-10-CM | POA: Diagnosis not present

## 2015-09-22 DIAGNOSIS — H59031 Cystoid macular edema following cataract surgery, right eye: Secondary | ICD-10-CM

## 2015-09-22 DIAGNOSIS — M1990 Unspecified osteoarthritis, unspecified site: Secondary | ICD-10-CM | POA: Diagnosis not present

## 2015-09-22 DIAGNOSIS — H43813 Vitreous degeneration, bilateral: Secondary | ICD-10-CM | POA: Diagnosis not present

## 2015-10-02 ENCOUNTER — Ambulatory Visit (INDEPENDENT_AMBULATORY_CARE_PROVIDER_SITE_OTHER): Payer: Medicare Other | Admitting: Ophthalmology

## 2015-10-10 DIAGNOSIS — Z79891 Long term (current) use of opiate analgesic: Secondary | ICD-10-CM | POA: Diagnosis not present

## 2015-10-10 DIAGNOSIS — M961 Postlaminectomy syndrome, not elsewhere classified: Secondary | ICD-10-CM | POA: Diagnosis not present

## 2015-10-10 DIAGNOSIS — G894 Chronic pain syndrome: Secondary | ICD-10-CM | POA: Diagnosis not present

## 2015-10-10 DIAGNOSIS — M5136 Other intervertebral disc degeneration, lumbar region: Secondary | ICD-10-CM | POA: Diagnosis not present

## 2015-10-17 DIAGNOSIS — Z961 Presence of intraocular lens: Secondary | ICD-10-CM | POA: Diagnosis not present

## 2015-10-17 DIAGNOSIS — H401133 Primary open-angle glaucoma, bilateral, severe stage: Secondary | ICD-10-CM | POA: Diagnosis not present

## 2015-10-21 DIAGNOSIS — M5136 Other intervertebral disc degeneration, lumbar region: Secondary | ICD-10-CM | POA: Diagnosis not present

## 2015-11-06 DIAGNOSIS — M961 Postlaminectomy syndrome, not elsewhere classified: Secondary | ICD-10-CM | POA: Diagnosis not present

## 2015-11-06 DIAGNOSIS — G894 Chronic pain syndrome: Secondary | ICD-10-CM | POA: Diagnosis not present

## 2015-11-06 DIAGNOSIS — Z79891 Long term (current) use of opiate analgesic: Secondary | ICD-10-CM | POA: Diagnosis not present

## 2015-11-06 DIAGNOSIS — M5136 Other intervertebral disc degeneration, lumbar region: Secondary | ICD-10-CM | POA: Diagnosis not present

## 2015-11-20 DIAGNOSIS — M810 Age-related osteoporosis without current pathological fracture: Secondary | ICD-10-CM | POA: Diagnosis not present

## 2015-11-20 DIAGNOSIS — Z79899 Other long term (current) drug therapy: Secondary | ICD-10-CM | POA: Diagnosis not present

## 2015-11-20 DIAGNOSIS — E559 Vitamin D deficiency, unspecified: Secondary | ICD-10-CM | POA: Diagnosis not present

## 2015-11-20 DIAGNOSIS — Z6827 Body mass index (BMI) 27.0-27.9, adult: Secondary | ICD-10-CM | POA: Diagnosis not present

## 2015-11-24 ENCOUNTER — Encounter (INDEPENDENT_AMBULATORY_CARE_PROVIDER_SITE_OTHER): Payer: Medicare Other | Admitting: Ophthalmology

## 2015-11-24 DIAGNOSIS — H59031 Cystoid macular edema following cataract surgery, right eye: Secondary | ICD-10-CM | POA: Diagnosis not present

## 2015-11-24 DIAGNOSIS — H35033 Hypertensive retinopathy, bilateral: Secondary | ICD-10-CM

## 2015-11-24 DIAGNOSIS — E11319 Type 2 diabetes mellitus with unspecified diabetic retinopathy without macular edema: Secondary | ICD-10-CM | POA: Diagnosis not present

## 2015-11-24 DIAGNOSIS — M7542 Impingement syndrome of left shoulder: Secondary | ICD-10-CM | POA: Diagnosis not present

## 2015-11-24 DIAGNOSIS — Z96611 Presence of right artificial shoulder joint: Secondary | ICD-10-CM | POA: Diagnosis not present

## 2015-11-24 DIAGNOSIS — E113293 Type 2 diabetes mellitus with mild nonproliferative diabetic retinopathy without macular edema, bilateral: Secondary | ICD-10-CM | POA: Diagnosis not present

## 2015-11-24 DIAGNOSIS — I10 Essential (primary) hypertension: Secondary | ICD-10-CM

## 2015-11-24 DIAGNOSIS — H43811 Vitreous degeneration, right eye: Secondary | ICD-10-CM

## 2015-11-24 DIAGNOSIS — Z471 Aftercare following joint replacement surgery: Secondary | ICD-10-CM | POA: Diagnosis not present

## 2015-11-25 ENCOUNTER — Other Ambulatory Visit (HOSPITAL_COMMUNITY): Payer: Self-pay

## 2015-11-26 ENCOUNTER — Encounter (HOSPITAL_COMMUNITY): Admission: RE | Admit: 2015-11-26 | Payer: Medicare Other | Source: Ambulatory Visit

## 2015-12-05 ENCOUNTER — Encounter (HOSPITAL_COMMUNITY): Payer: Medicare Other

## 2015-12-05 DIAGNOSIS — K921 Melena: Secondary | ICD-10-CM | POA: Diagnosis not present

## 2015-12-05 DIAGNOSIS — Z6827 Body mass index (BMI) 27.0-27.9, adult: Secondary | ICD-10-CM | POA: Diagnosis not present

## 2015-12-07 ENCOUNTER — Encounter (HOSPITAL_COMMUNITY): Payer: Self-pay

## 2015-12-07 ENCOUNTER — Emergency Department (HOSPITAL_COMMUNITY)
Admission: EM | Admit: 2015-12-07 | Discharge: 2015-12-07 | Disposition: A | Discharge status: Home / Self Care | Payer: Medicare Other | Attending: Emergency Medicine | Admitting: Emergency Medicine

## 2015-12-07 DIAGNOSIS — I10 Essential (primary) hypertension: Secondary | ICD-10-CM | POA: Diagnosis not present

## 2015-12-07 DIAGNOSIS — Z96642 Presence of left artificial hip joint: Secondary | ICD-10-CM | POA: Insufficient documentation

## 2015-12-07 DIAGNOSIS — Z85820 Personal history of malignant melanoma of skin: Secondary | ICD-10-CM | POA: Insufficient documentation

## 2015-12-07 DIAGNOSIS — M1990 Unspecified osteoarthritis, unspecified site: Secondary | ICD-10-CM | POA: Insufficient documentation

## 2015-12-07 DIAGNOSIS — Z7982 Long term (current) use of aspirin: Secondary | ICD-10-CM | POA: Diagnosis not present

## 2015-12-07 DIAGNOSIS — Z7984 Long term (current) use of oral hypoglycemic drugs: Secondary | ICD-10-CM | POA: Diagnosis not present

## 2015-12-07 DIAGNOSIS — E785 Hyperlipidemia, unspecified: Secondary | ICD-10-CM | POA: Diagnosis not present

## 2015-12-07 DIAGNOSIS — H5711 Ocular pain, right eye: Secondary | ICD-10-CM | POA: Diagnosis present

## 2015-12-07 DIAGNOSIS — M810 Age-related osteoporosis without current pathological fracture: Secondary | ICD-10-CM | POA: Diagnosis not present

## 2015-12-07 DIAGNOSIS — E119 Type 2 diabetes mellitus without complications: Secondary | ICD-10-CM | POA: Insufficient documentation

## 2015-12-07 MED ORDER — TETRACAINE HCL 0.5 % OP SOLN
2.0000 [drp] | Freq: Once | OPHTHALMIC | Status: AC
  Administered 2015-12-07: 2 [drp] via OPHTHALMIC
  Filled 2015-12-07: qty 4

## 2015-12-07 MED ORDER — FLUORESCEIN SODIUM 1 MG OP STRP
1.0000 | ORAL_STRIP | Freq: Once | OPHTHALMIC | Status: AC
  Administered 2015-12-07: 1 via OPHTHALMIC
  Filled 2015-12-07: qty 1

## 2015-12-07 NOTE — Discharge Instructions (Signed)
You have been seen today for eye pain. Your exam showed no acute abnormalities. It is advised that you follow up with your ophthalmologist tomorrow. Call the office first thing in the morning to be seen later that day. If the instructions given to you by the ophthalmologist at that time differ from these instructions, follow the advice of the ophthalmologist. May use your home pain medication or Tylenol for pain. Follow up with PCP as needed. Return to ED immediately for changes or loss of vision, worsening pain, pain that spreads into your head, weakness or numbness anywhere on your body, nausea or vomiting, dizziness, or any other major concerns.

## 2015-12-07 NOTE — ED Notes (Signed)
Pt states sudden eye pain to rt eye starting today.  No injury noted.  No change in vision per pt.  Pain does not radiate to head.  No other neuro deficits noted.

## 2015-12-07 NOTE — ED Provider Notes (Signed)
CSN: [id number]     Arrival date & time 12/07/15  1118 History   First MD Initiated Contact with Patient 12/07/15 1251     Chief Complaint  Patient presents with  . Eye Pain     (Consider location/radiation/quality/duration/timing/severity/associated sxs/prior Treatment) HPI   Marissa Ryan is a 82 y.o. female, with a history of hypertension, DM, glaucoma, and cataracts with surgical intervention, presenting to the ED with Right eye pain that began suddenly at 9:30 AM this morning. Patient states she was getting ready for church when she began to feel a 7 out of 10, constant, burning pain that felt "deep behind the eye." Patient denies changes in her vision, headache, fever/chills, nausea/vomiting, neuro deficits, or any other complaints. Patient states she has had multiple surgeries in both of her eyes. Patient's ophthalmologist is Dr. Tempie Hoist of [facility].     Past Medical History  Diagnosis Date  . Osteoporosis   . Hypertension   . Arthritis   . Gallstones   . Hyperlipidemia   . IBS (irritable bowel syndrome)   . Diabetes mellitus without complication ([place])   . Cancer ([place])     hx of skin cancer removal on hand   . Anxiety    Past Surgical History  Procedure Laterality Date  . Cholecystectomy  1999  . Back surgery      x2  . Appendectomy    . Rotator cuff repair    . Wrist surgery      left   . Eye surgery      for glaucoma   . Cataract surgery       bilateral   . Left eye peeling surgery     . Left femur bone surgery       1978  . Total hip arthroplasty Left 02/20/2014    Procedure: LEFT TOTAL HIP ARTHROPLASTY ANTERIOR APPROACH;  Surgeon: Gearlean Alf, MD;  Location: [facility];  Service: Orthopedics;  Laterality: Left;  . Reverse shoulder arthroplasty Right 10/10/2014    Procedure: RIGHT REVERSE SHOULDER ARTHROPLASTY;  Surgeon: Justice Britain, MD;  Location: [facility];  Service: Orthopedics;  Laterality: Right;   Family History  Problem Relation Age of  Onset  . Stroke Mother   . Hypertension Mother   . Ovarian cancer Sister    Social History  Substance Use Topics  . Smoking status: Never Smoker   . Smokeless tobacco: Never Used  . Alcohol Use: Yes     Comment: rare glass of wine    OB History    No data available     Review of Systems  Constitutional: Negative for fever, chills and diaphoresis.  Eyes: Positive for pain. Negative for photophobia, discharge, redness and visual disturbance.  Neurological: Negative for syncope, weakness, numbness and headaches.  All other systems reviewed and are negative.     Allergies  Robaxin; Ancef; Morphine and related; Penicillins; and Flurbiprofen  Home Medications   Prior to Admission medications   Medication Sig Start Date End Date Taking? Authorizing Provider  nepafenac (ILEVRO) 0.3 % ophthalmic suspension Place 1 drop into the right eye daily.   Yes Historical Provider, MD  prednisoLONE acetate (PRED FORTE) 1 % ophthalmic suspension Place 1 drop into the right eye 2 (two) times daily.   Yes Historical Provider, MD  acetaminophen (TYLENOL) 325 MG tablet Take 2 tablets (650 mg total) by mouth every 6 (six) hours as needed for mild pain (or Fever >/= 101). Patient not taking: Reported  on 10/02/2014 02/25/14   Arlee Muslim, PA-C  ALPRAZolam Duanne Moron) 0.25 MG tablet Take 0.25-0.5 mg by mouth at bedtime as needed for sleep.    Historical Provider, MD  aspirin EC 81 MG tablet Take 81 mg by mouth daily.    Historical Provider, MD  bisacodyl (DULCOLAX) 10 MG suppository Place 1 suppository (10 mg total) rectally daily as needed for mild constipation or moderate constipation. Patient not taking: Reported on 10/02/2014 02/25/14   Arlee Muslim, PA-C  Cinnamon 500 MG capsule Take 500 mg by mouth daily.    Historical Provider, MD  cyclobenzaprine (FLEXERIL) 10 MG tablet Take 1 tablet (10 mg total) by mouth 3 (three) times daily as needed for muscle spasms. Patient not taking: Reported on 10/02/2014  02/25/14   Arlee Muslim, PA-C  diazepam (VALIUM) 5 MG tablet Take 0.5-1 tablets (2.5-5 mg total) by mouth every 6 (six) hours as needed for muscle spasms or sedation. Patient not taking: Reported on 12/20/2014 10/10/14   Jenetta Loges, PA-C  docusate sodium 100 MG CAPS Take 100 mg by mouth 2 (two) times daily. Patient taking differently: Take 100 mg by mouth daily as needed (constipation).  02/25/14   Arlee Muslim, PA-C  ezetimibe-simvastatin (VYTORIN) 10-40 MG per tablet Take 0.5 tablets by mouth at bedtime.     Historical Provider, MD  furosemide (LASIX) 20 MG tablet Take 20 mg by mouth 3 (three) times a week.    Historical Provider, MD  gabapentin (NEURONTIN) 300 MG capsule Take 300 mg by mouth 4 (four) times daily.     Historical Provider, MD  HYDROmorphone (DILAUDID) 2 MG tablet Take 1 tablet (2 mg total) by mouth every 4 (four) hours as needed for moderate pain or severe pain. Patient not taking: Reported on 10/02/2014 02/25/14   Arlee Muslim, PA-C  losartan (COZAAR) 50 MG tablet Take 50 mg by mouth daily with supper.     Historical Provider, MD  metFORMIN (GLUCOPHAGE) 500 MG tablet Take 500 mg by mouth daily with breakfast.    Historical Provider, MD  metoCLOPramide (REGLAN) 5 MG tablet Take 1-2 tablets (5-10 mg total) by mouth every 8 (eight) hours as needed for nausea (if ondansetron (ZOFRAN) ineffective.). Patient not taking: Reported on 10/02/2014 02/25/14   Arlee Muslim, PA-C  Omega-3 Fatty Acids (FISH OIL) 1000 MG CAPS Take 1 capsule by mouth daily.    Historical Provider, MD  ondansetron (ZOFRAN) 4 MG tablet Take 1 tablet (4 mg total) by mouth every 6 (six) hours as needed for nausea. Patient not taking: Reported on 10/02/2014 02/25/14   Arlee Muslim, PA-C  oxyCODONE (OXY IR/ROXICODONE) 5 MG immediate release tablet Take 1-2 tablets (5-10 mg total) by mouth every 4 (four) hours as needed for moderate pain or severe pain. 10/10/14   Olivia Mackie Shuford, PA-C  polyethylene glycol (MIRALAX / GLYCOLAX)  packet Take 17 g by mouth daily as needed for mild constipation. 02/25/14   Arlee Muslim, PA-C  potassium chloride (MICRO-K) 10 MEQ CR capsule Take 10 mEq by mouth 3 (three) times a week.    Historical Provider, MD  senna (SENOKOT) 8.6 MG TABS tablet Take 1 tablet by mouth daily as needed for mild constipation.    Historical Provider, MD  sertraline (ZOLOFT) 50 MG tablet Take 50 mg by mouth daily.    Historical Provider, MD  traMADol (ULTRAM) 50 MG tablet Take 1-2 tablets (50-100 mg total) by mouth every 6 (six) hours as needed (mild pain). Patient not taking: Reported on 10/02/2014 02/25/14  Arlee Muslim, PA-C  traMADol (ULTRAM) 50 MG tablet Take 1 tablet (50 mg total) by mouth every 6 (six) hours as needed (mild pain). Patient not taking: Reported on 12/20/2014 10/10/14   Jenetta Loges, PA-C  Vitamin D, Ergocalciferol, (DRISDOL) 50000 UNITS CAPS capsule Take 50,000 Units by mouth every 7 (seven) days.    Historical Provider, MD   BP 160/68 mmHg  Pulse 63  Temp(Src) 98.3 F (36.8 C) (Oral)  Resp 16  SpO2 98% Physical Exam  Constitutional: She is oriented to person, place, and time. She appears well-developed and well-nourished. No distress.  HENT:  Head: Normocephalic and atraumatic.  Eyes: Conjunctivae and EOM are normal.  No contact lenses in place. Pt is currently wearing bifocal eye glasses. Woods Lamp exam shows no increased uptake of fluorescein. Pupils reactive to light. Post-surgical pupil evident on the left. Slit lamp exam was also performed with no signs of corneal abrasion or ulcer, iritis, anterior chamber damage, or globe damage.  Tono-Pen values: Right eye: 12  Left eye: 8    Visual Acuity  Right Eye Distance: 20/40 Left Eye Distance: 20/70 Bilateral Distance: 20/50  Neck: Normal range of motion. Neck supple.  Cardiovascular: Normal rate, regular rhythm and intact distal pulses.   Pulmonary/Chest: Effort normal. No respiratory distress.  Abdominal: Soft. There is no  tenderness. There is no guarding.  Musculoskeletal: She exhibits no edema or tenderness.  Lymphadenopathy:    She has no cervical adenopathy.  Neurological: She is alert and oriented to person, place, and time. She has normal reflexes.  No sensory deficits. Strength 5/5 in all extremities. No gait disturbance. Coordination intact. Cranial nerves III-XII grossly intact. No facial droop.   Skin: Skin is warm and dry. She is not diaphoretic.  Psychiatric: She has a normal mood and affect. Her behavior is normal.  Nursing note and vitals reviewed.   ED Course  Procedures (including critical care time)   MDM   Final diagnoses:  Eye pain, right    NILZA NAUMAN presents with right eye pain that began around 9:30 AM this morning.  Findings and plan of care discussed with Davonna Belling, MD.   Patient has no vision deficits and has no acute abnormalities on exam. No neuro deficits. Patient has no evidence of emergent conditions nor does she meet criteria for emergent ophthalmology consult at this time. Patient to follow-up with Dr. Zigmund Daniel first thing tomorrow morning. A message was also sent to Dr. Zigmund Daniel to make him aware of this patient. Strict return precautions discussed. Patient voiced understanding of these instructions and is comfortable with discharge. Patient appears safe for discharge at this time.   Filed Vitals:   12/07/15 1133  BP: 160/68  Pulse: 63  Temp: 98.3 F (36.8 C)  TempSrc: Oral  Resp: 16  SpO2: 98%      Lorayne Bender, PA-C 12/07/15 [place], MD 12/07/15 1749

## 2015-12-08 DIAGNOSIS — H04123 Dry eye syndrome of bilateral lacrimal glands: Secondary | ICD-10-CM | POA: Diagnosis not present

## 2015-12-08 DIAGNOSIS — M316 Other giant cell arteritis: Secondary | ICD-10-CM | POA: Diagnosis not present

## 2015-12-08 DIAGNOSIS — H5711 Ocular pain, right eye: Secondary | ICD-10-CM | POA: Diagnosis not present

## 2015-12-08 DIAGNOSIS — H11153 Pinguecula, bilateral: Secondary | ICD-10-CM | POA: Diagnosis not present

## 2015-12-08 DIAGNOSIS — Z961 Presence of intraocular lens: Secondary | ICD-10-CM | POA: Diagnosis not present

## 2015-12-08 DIAGNOSIS — H401134 Primary open-angle glaucoma, bilateral, indeterminate stage: Secondary | ICD-10-CM | POA: Diagnosis not present

## 2015-12-08 DIAGNOSIS — H35342 Macular cyst, hole, or pseudohole, left eye: Secondary | ICD-10-CM | POA: Diagnosis not present

## 2015-12-08 DIAGNOSIS — H401133 Primary open-angle glaucoma, bilateral, severe stage: Secondary | ICD-10-CM | POA: Diagnosis not present

## 2015-12-08 DIAGNOSIS — H18413 Arcus senilis, bilateral: Secondary | ICD-10-CM | POA: Diagnosis not present

## 2015-12-08 DIAGNOSIS — H11423 Conjunctival edema, bilateral: Secondary | ICD-10-CM | POA: Diagnosis not present

## 2015-12-08 DIAGNOSIS — K921 Melena: Secondary | ICD-10-CM | POA: Diagnosis not present

## 2015-12-08 DIAGNOSIS — H16143 Punctate keratitis, bilateral: Secondary | ICD-10-CM | POA: Diagnosis not present

## 2015-12-08 DIAGNOSIS — H35033 Hypertensive retinopathy, bilateral: Secondary | ICD-10-CM | POA: Diagnosis not present

## 2015-12-08 DIAGNOSIS — I10 Essential (primary) hypertension: Secondary | ICD-10-CM | POA: Diagnosis not present

## 2015-12-22 DIAGNOSIS — S40012A Contusion of left shoulder, initial encounter: Secondary | ICD-10-CM | POA: Diagnosis not present

## 2015-12-22 DIAGNOSIS — M7061 Trochanteric bursitis, right hip: Secondary | ICD-10-CM | POA: Diagnosis not present

## 2016-01-06 DIAGNOSIS — Z9849 Cataract extraction status, unspecified eye: Secondary | ICD-10-CM | POA: Diagnosis not present

## 2016-01-06 DIAGNOSIS — H11153 Pinguecula, bilateral: Secondary | ICD-10-CM | POA: Diagnosis not present

## 2016-01-06 DIAGNOSIS — H5053 Vertical heterophoria: Secondary | ICD-10-CM | POA: Diagnosis not present

## 2016-01-06 DIAGNOSIS — H11423 Conjunctival edema, bilateral: Secondary | ICD-10-CM | POA: Diagnosis not present

## 2016-01-06 DIAGNOSIS — H5201 Hypermetropia, right eye: Secondary | ICD-10-CM | POA: Diagnosis not present

## 2016-01-06 DIAGNOSIS — H18413 Arcus senilis, bilateral: Secondary | ICD-10-CM | POA: Diagnosis not present

## 2016-01-06 DIAGNOSIS — H5212 Myopia, left eye: Secondary | ICD-10-CM | POA: Diagnosis not present

## 2016-01-06 DIAGNOSIS — H401133 Primary open-angle glaucoma, bilateral, severe stage: Secondary | ICD-10-CM | POA: Diagnosis not present

## 2016-01-06 DIAGNOSIS — Z961 Presence of intraocular lens: Secondary | ICD-10-CM | POA: Diagnosis not present

## 2016-01-06 DIAGNOSIS — H52223 Regular astigmatism, bilateral: Secondary | ICD-10-CM | POA: Diagnosis not present

## 2016-02-09 DIAGNOSIS — E559 Vitamin D deficiency, unspecified: Secondary | ICD-10-CM | POA: Diagnosis not present

## 2016-02-09 DIAGNOSIS — E784 Other hyperlipidemia: Secondary | ICD-10-CM | POA: Diagnosis not present

## 2016-02-09 DIAGNOSIS — I10 Essential (primary) hypertension: Secondary | ICD-10-CM | POA: Diagnosis not present

## 2016-02-09 DIAGNOSIS — N390 Urinary tract infection, site not specified: Secondary | ICD-10-CM | POA: Diagnosis not present

## 2016-02-09 DIAGNOSIS — E119 Type 2 diabetes mellitus without complications: Secondary | ICD-10-CM | POA: Diagnosis not present

## 2016-02-16 DIAGNOSIS — M4692 Unspecified inflammatory spondylopathy, cervical region: Secondary | ICD-10-CM | POA: Diagnosis not present

## 2016-02-16 DIAGNOSIS — Z0000 Encounter for general adult medical examination without abnormal findings: Secondary | ICD-10-CM | POA: Diagnosis not present

## 2016-02-16 DIAGNOSIS — Z1231 Encounter for screening mammogram for malignant neoplasm of breast: Secondary | ICD-10-CM | POA: Diagnosis not present

## 2016-02-16 DIAGNOSIS — M1990 Unspecified osteoarthritis, unspecified site: Secondary | ICD-10-CM | POA: Diagnosis not present

## 2016-02-16 DIAGNOSIS — E1129 Type 2 diabetes mellitus with other diabetic kidney complication: Secondary | ICD-10-CM | POA: Diagnosis not present

## 2016-02-16 DIAGNOSIS — Z96642 Presence of left artificial hip joint: Secondary | ICD-10-CM | POA: Diagnosis not present

## 2016-02-16 DIAGNOSIS — N3090 Cystitis, unspecified without hematuria: Secondary | ICD-10-CM | POA: Diagnosis not present

## 2016-02-16 DIAGNOSIS — R808 Other proteinuria: Secondary | ICD-10-CM | POA: Diagnosis not present

## 2016-02-16 DIAGNOSIS — E559 Vitamin D deficiency, unspecified: Secondary | ICD-10-CM | POA: Diagnosis not present

## 2016-02-16 DIAGNOSIS — I10 Essential (primary) hypertension: Secondary | ICD-10-CM | POA: Diagnosis not present

## 2016-02-16 DIAGNOSIS — N182 Chronic kidney disease, stage 2 (mild): Secondary | ICD-10-CM | POA: Diagnosis not present

## 2016-02-16 DIAGNOSIS — M810 Age-related osteoporosis without current pathological fracture: Secondary | ICD-10-CM | POA: Diagnosis not present

## 2016-02-16 DIAGNOSIS — Z6826 Body mass index (BMI) 26.0-26.9, adult: Secondary | ICD-10-CM | POA: Diagnosis not present

## 2016-02-18 DIAGNOSIS — Z961 Presence of intraocular lens: Secondary | ICD-10-CM | POA: Diagnosis not present

## 2016-02-18 DIAGNOSIS — H401134 Primary open-angle glaucoma, bilateral, indeterminate stage: Secondary | ICD-10-CM | POA: Diagnosis not present

## 2016-02-18 DIAGNOSIS — H5711 Ocular pain, right eye: Secondary | ICD-10-CM | POA: Diagnosis not present

## 2016-02-23 ENCOUNTER — Other Ambulatory Visit (HOSPITAL_COMMUNITY): Payer: Self-pay | Admitting: *Deleted

## 2016-02-24 ENCOUNTER — Ambulatory Visit (HOSPITAL_COMMUNITY)
Admission: RE | Admit: 2016-02-24 | Discharge: 2016-02-24 | Disposition: A | Discharge status: Home / Self Care | Payer: Medicare Other | Source: Ambulatory Visit | Attending: Internal Medicine | Admitting: Internal Medicine

## 2016-02-24 DIAGNOSIS — M810 Age-related osteoporosis without current pathological fracture: Secondary | ICD-10-CM | POA: Insufficient documentation

## 2016-02-24 MED ORDER — ZOLEDRONIC ACID 5 MG/100ML IV SOLN
5.0000 mg | Freq: Once | INTRAVENOUS | Status: AC
  Administered 2016-02-24: 5 mg via INTRAVENOUS

## 2016-02-24 MED ORDER — ZOLEDRONIC ACID 5 MG/100ML IV SOLN
INTRAVENOUS | Status: AC
  Filled 2016-02-24: qty 100

## 2016-04-09 DIAGNOSIS — M7061 Trochanteric bursitis, right hip: Secondary | ICD-10-CM | POA: Diagnosis not present

## 2016-04-19 DIAGNOSIS — Z6825 Body mass index (BMI) 25.0-25.9, adult: Secondary | ICD-10-CM | POA: Diagnosis not present

## 2016-04-19 DIAGNOSIS — I10 Essential (primary) hypertension: Secondary | ICD-10-CM | POA: Diagnosis not present

## 2016-04-19 DIAGNOSIS — R5383 Other fatigue: Secondary | ICD-10-CM | POA: Diagnosis not present

## 2016-04-19 DIAGNOSIS — R946 Abnormal results of thyroid function studies: Secondary | ICD-10-CM | POA: Diagnosis not present

## 2016-04-19 DIAGNOSIS — R634 Abnormal weight loss: Secondary | ICD-10-CM | POA: Diagnosis not present

## 2016-04-19 DIAGNOSIS — E559 Vitamin D deficiency, unspecified: Secondary | ICD-10-CM | POA: Diagnosis not present

## 2016-04-19 DIAGNOSIS — E119 Type 2 diabetes mellitus without complications: Secondary | ICD-10-CM | POA: Diagnosis not present

## 2016-05-02 DIAGNOSIS — Z23 Encounter for immunization: Secondary | ICD-10-CM | POA: Diagnosis not present

## 2016-05-21 DIAGNOSIS — M7061 Trochanteric bursitis, right hip: Secondary | ICD-10-CM | POA: Diagnosis not present

## 2016-05-26 ENCOUNTER — Ambulatory Visit (INDEPENDENT_AMBULATORY_CARE_PROVIDER_SITE_OTHER): Payer: Medicare Other | Admitting: Ophthalmology

## 2016-05-26 DIAGNOSIS — E11319 Type 2 diabetes mellitus with unspecified diabetic retinopathy without macular edema: Secondary | ICD-10-CM | POA: Diagnosis not present

## 2016-05-26 DIAGNOSIS — I10 Essential (primary) hypertension: Secondary | ICD-10-CM | POA: Diagnosis not present

## 2016-05-26 DIAGNOSIS — H59031 Cystoid macular edema following cataract surgery, right eye: Secondary | ICD-10-CM

## 2016-05-26 DIAGNOSIS — H43811 Vitreous degeneration, right eye: Secondary | ICD-10-CM

## 2016-05-26 DIAGNOSIS — H35033 Hypertensive retinopathy, bilateral: Secondary | ICD-10-CM | POA: Diagnosis not present

## 2016-05-26 DIAGNOSIS — E113293 Type 2 diabetes mellitus with mild nonproliferative diabetic retinopathy without macular edema, bilateral: Secondary | ICD-10-CM

## 2016-06-02 DIAGNOSIS — G894 Chronic pain syndrome: Secondary | ICD-10-CM | POA: Diagnosis not present

## 2016-06-02 DIAGNOSIS — M5136 Other intervertebral disc degeneration, lumbar region: Secondary | ICD-10-CM | POA: Diagnosis not present

## 2016-06-02 DIAGNOSIS — M961 Postlaminectomy syndrome, not elsewhere classified: Secondary | ICD-10-CM | POA: Diagnosis not present

## 2016-06-02 DIAGNOSIS — M7551 Bursitis of right shoulder: Secondary | ICD-10-CM | POA: Diagnosis not present

## 2016-06-14 DIAGNOSIS — Z96611 Presence of right artificial shoulder joint: Secondary | ICD-10-CM | POA: Diagnosis not present

## 2016-06-14 DIAGNOSIS — Z471 Aftercare following joint replacement surgery: Secondary | ICD-10-CM | POA: Diagnosis not present

## 2016-06-17 DIAGNOSIS — E119 Type 2 diabetes mellitus without complications: Secondary | ICD-10-CM | POA: Diagnosis not present

## 2016-06-17 DIAGNOSIS — I10 Essential (primary) hypertension: Secondary | ICD-10-CM | POA: Diagnosis not present

## 2016-06-17 DIAGNOSIS — M810 Age-related osteoporosis without current pathological fracture: Secondary | ICD-10-CM | POA: Diagnosis not present

## 2016-06-17 DIAGNOSIS — Z6825 Body mass index (BMI) 25.0-25.9, adult: Secondary | ICD-10-CM | POA: Diagnosis not present

## 2016-06-21 DIAGNOSIS — Z961 Presence of intraocular lens: Secondary | ICD-10-CM | POA: Diagnosis not present

## 2016-06-21 DIAGNOSIS — H401134 Primary open-angle glaucoma, bilateral, indeterminate stage: Secondary | ICD-10-CM | POA: Diagnosis not present

## 2016-06-21 DIAGNOSIS — H5711 Ocular pain, right eye: Secondary | ICD-10-CM | POA: Diagnosis not present

## 2016-06-29 DIAGNOSIS — M961 Postlaminectomy syndrome, not elsewhere classified: Secondary | ICD-10-CM | POA: Diagnosis not present

## 2016-06-29 DIAGNOSIS — M5136 Other intervertebral disc degeneration, lumbar region: Secondary | ICD-10-CM | POA: Diagnosis not present

## 2016-07-14 DIAGNOSIS — M961 Postlaminectomy syndrome, not elsewhere classified: Secondary | ICD-10-CM | POA: Diagnosis not present

## 2016-07-14 DIAGNOSIS — M5136 Other intervertebral disc degeneration, lumbar region: Secondary | ICD-10-CM | POA: Diagnosis not present

## 2016-07-14 DIAGNOSIS — Z79891 Long term (current) use of opiate analgesic: Secondary | ICD-10-CM | POA: Diagnosis not present

## 2016-07-30 DIAGNOSIS — M79672 Pain in left foot: Secondary | ICD-10-CM | POA: Diagnosis not present

## 2016-07-30 DIAGNOSIS — M2041 Other hammer toe(s) (acquired), right foot: Secondary | ICD-10-CM | POA: Diagnosis not present

## 2016-07-30 DIAGNOSIS — M2012 Hallux valgus (acquired), left foot: Secondary | ICD-10-CM | POA: Diagnosis not present

## 2016-07-30 DIAGNOSIS — M2042 Other hammer toe(s) (acquired), left foot: Secondary | ICD-10-CM | POA: Diagnosis not present

## 2016-08-06 ENCOUNTER — Emergency Department (HOSPITAL_COMMUNITY): Payer: Medicare Other

## 2016-08-06 ENCOUNTER — Emergency Department (HOSPITAL_COMMUNITY)
Admission: EM | Admit: 2016-08-06 | Discharge: 2016-08-06 | Disposition: A | Discharge status: Home / Self Care | Payer: Medicare Other | Attending: Emergency Medicine | Admitting: Emergency Medicine

## 2016-08-06 ENCOUNTER — Encounter (HOSPITAL_COMMUNITY): Payer: Self-pay | Admitting: *Deleted

## 2016-08-06 DIAGNOSIS — Z96611 Presence of right artificial shoulder joint: Secondary | ICD-10-CM | POA: Insufficient documentation

## 2016-08-06 DIAGNOSIS — Z96642 Presence of left artificial hip joint: Secondary | ICD-10-CM | POA: Diagnosis not present

## 2016-08-06 DIAGNOSIS — M25511 Pain in right shoulder: Secondary | ICD-10-CM | POA: Diagnosis not present

## 2016-08-06 DIAGNOSIS — Y929 Unspecified place or not applicable: Secondary | ICD-10-CM | POA: Insufficient documentation

## 2016-08-06 DIAGNOSIS — Y999 Unspecified external cause status: Secondary | ICD-10-CM | POA: Diagnosis not present

## 2016-08-06 DIAGNOSIS — M25561 Pain in right knee: Secondary | ICD-10-CM | POA: Diagnosis not present

## 2016-08-06 DIAGNOSIS — E119 Type 2 diabetes mellitus without complications: Secondary | ICD-10-CM | POA: Insufficient documentation

## 2016-08-06 DIAGNOSIS — I10 Essential (primary) hypertension: Secondary | ICD-10-CM | POA: Diagnosis not present

## 2016-08-06 DIAGNOSIS — Y939 Activity, unspecified: Secondary | ICD-10-CM | POA: Insufficient documentation

## 2016-08-06 DIAGNOSIS — S7001XA Contusion of right hip, initial encounter: Secondary | ICD-10-CM | POA: Insufficient documentation

## 2016-08-06 DIAGNOSIS — Z7984 Long term (current) use of oral hypoglycemic drugs: Secondary | ICD-10-CM | POA: Diagnosis not present

## 2016-08-06 DIAGNOSIS — Z7982 Long term (current) use of aspirin: Secondary | ICD-10-CM | POA: Insufficient documentation

## 2016-08-06 DIAGNOSIS — S7011XA Contusion of right thigh, initial encounter: Secondary | ICD-10-CM | POA: Insufficient documentation

## 2016-08-06 DIAGNOSIS — S79911A Unspecified injury of right hip, initial encounter: Secondary | ICD-10-CM | POA: Diagnosis not present

## 2016-08-06 DIAGNOSIS — W19XXXA Unspecified fall, initial encounter: Secondary | ICD-10-CM

## 2016-08-06 DIAGNOSIS — W1839XA Other fall on same level, initial encounter: Secondary | ICD-10-CM | POA: Insufficient documentation

## 2016-08-06 DIAGNOSIS — M25461 Effusion, right knee: Secondary | ICD-10-CM | POA: Diagnosis not present

## 2016-08-06 DIAGNOSIS — Z85828 Personal history of other malignant neoplasm of skin: Secondary | ICD-10-CM | POA: Insufficient documentation

## 2016-08-06 NOTE — ED Notes (Signed)
Patient d/c'd self care.  F/U reviewed.  Patient verbalized understanding. 

## 2016-08-06 NOTE — ED Notes (Signed)
Triage delayed d/t Pt needing to use the restroom.

## 2016-08-06 NOTE — ED Triage Notes (Signed)
Pt states she fell last night, has pain in her rt knee, hip and shoulder. No Loc or head injury

## 2016-08-06 NOTE — ED Provider Notes (Signed)
[facility] DEPT Provider Note   CSN: [id number] Arrival date & time: 08/06/16  1338     History   Chief Complaint Chief Complaint  Patient presents with  . Fall  . Rt hip pain  . Rt shoulder    HPI NABIHAH MAROLDA is a 83 y.o. female.  Patient is an 83 year old female with a history of arthritis, hypertension, hyperlipidemia and diabetes presenting today after mechanical fall yesterday. She was wearing oversized slippers and she stepped on the slipper causing her to fall to her right side. She has been able to walk today but her knee and hip have been sore. She is using her walker at home and still able to get around the house. She is taking Percocet at home which is a chronic medication which does help with pain.   The history is provided by the patient.  Fall  This is a new problem. The current episode started yesterday. The problem occurs constantly. The problem has not changed since onset.Associated symptoms comments: Right hip pain, right knee pain and swelling, right shoulder pain.. The symptoms are aggravated by walking. The symptoms are relieved by narcotics. She has tried nothing for the symptoms. The treatment provided no relief.    Past Medical History:  Diagnosis Date  . Anxiety   . Arthritis   . Cancer ([place])    hx of skin cancer removal on hand   . Diabetes mellitus without complication ([place])   . Gallstones   . Hyperlipidemia   . Hypertension   . IBS (irritable bowel syndrome)   . Osteoporosis     Patient Active Problem List   Diagnosis Date Noted  . 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 OA (osteoarthritis) of hip 02/20/2014  . Essential hypertension 12/03/2013  . Hyperlipidemia 12/03/2013  . Diabetes ([place]) 12/03/2013    Past Surgical History:  Procedure Laterality Date  . APPENDECTOMY    . BACK SURGERY     x2  . cataract surgery      bilateral   . CHOLECYSTECTOMY  1999  . EYE SURGERY     for glaucoma   . left eye peeling  surgery     . left femur bone surgery      1978  . REVERSE SHOULDER ARTHROPLASTY Right 10/10/2014   Procedure: RIGHT REVERSE SHOULDER ARTHROPLASTY;  Surgeon: Justice Britain, MD;  Location: [facility];  Service: Orthopedics;  Laterality: Right;  . ROTATOR CUFF REPAIR    . TOTAL HIP ARTHROPLASTY Left 02/20/2014   Procedure: LEFT TOTAL HIP ARTHROPLASTY ANTERIOR APPROACH;  Surgeon: Gearlean Alf, MD;  Location: [facility];  Service: Orthopedics;  Laterality: Left;  . WRIST SURGERY     left     OB History    No data available       Home Medications    Prior to Admission medications   Medication Sig Start Date End Date Taking? Authorizing Provider  acetaminophen (TYLENOL) 325 MG tablet Take 2 tablets (650 mg total) by mouth every 6 (six) hours as needed for mild pain (or Fever >/= 101). Patient not taking: Reported on 10/02/2014 02/25/14   Alexzandrew L Dara Lords, PA-C  ALPRAZolam Duanne Moron) 0.25 MG tablet Take 0.25-0.5 mg by mouth at bedtime as needed for sleep.    Historical Provider, MD  aspirin EC 81 MG tablet Take 81 mg by mouth daily.    Historical Provider, MD  bisacodyl (DULCOLAX) 10 MG suppository Place 1 suppository (10 mg total) rectally daily as needed for  mild constipation or moderate constipation. Patient not taking: Reported on 10/02/2014 02/25/14   Alexzandrew L Perkins, PA-C  Cinnamon 500 MG capsule Take 500 mg by mouth daily.    Historical Provider, MD  cyclobenzaprine (FLEXERIL) 10 MG tablet Take 1 tablet (10 mg total) by mouth 3 (three) times daily as needed for muscle spasms. Patient not taking: Reported on 10/02/2014 02/25/14   Alexzandrew L Perkins, PA-C  diazepam (VALIUM) 5 MG tablet Take 0.5-1 tablets (2.5-5 mg total) by mouth every 6 (six) hours as needed for muscle spasms or sedation. Patient not taking: Reported on 12/20/2014 10/10/14   Jenetta Loges, PA-C  docusate sodium 100 MG CAPS Take 100 mg by mouth 2 (two) times daily. Patient taking differently: Take 100 mg by mouth daily as  needed (constipation).  02/25/14   Alexzandrew L Perkins, PA-C  ezetimibe-simvastatin (VYTORIN) 10-40 MG per tablet Take 0.5 tablets by mouth at bedtime.     Historical Provider, MD  furosemide (LASIX) 20 MG tablet Take 20 mg by mouth 3 (three) times a week.    Historical Provider, MD  gabapentin (NEURONTIN) 300 MG capsule Take 300 mg by mouth 4 (four) times daily.     Historical Provider, MD  HYDROmorphone (DILAUDID) 2 MG tablet Take 1 tablet (2 mg total) by mouth every 4 (four) hours as needed for moderate pain or severe pain. Patient not taking: Reported on 10/02/2014 02/25/14   Alexzandrew L Perkins, PA-C  losartan (COZAAR) 50 MG tablet Take 50 mg by mouth daily with supper.     Historical Provider, MD  metFORMIN (GLUCOPHAGE) 500 MG tablet Take 500 mg by mouth daily with breakfast.    Historical Provider, MD  metoCLOPramide (REGLAN) 5 MG tablet Take 1-2 tablets (5-10 mg total) by mouth every 8 (eight) hours as needed for nausea (if ondansetron (ZOFRAN) ineffective.). Patient not taking: Reported on 10/02/2014 02/25/14   Alexzandrew L Perkins, PA-C  nepafenac (ILEVRO) 0.3 % ophthalmic suspension Place 1 drop into the right eye daily.    Historical Provider, MD  Omega-3 Fatty Acids (FISH OIL) 1000 MG CAPS Take 1 capsule by mouth daily.    Historical Provider, MD  ondansetron (ZOFRAN) 4 MG tablet Take 1 tablet (4 mg total) by mouth every 6 (six) hours as needed for nausea. Patient not taking: Reported on 10/02/2014 02/25/14   Alexzandrew L Perkins, PA-C  oxyCODONE (OXY IR/ROXICODONE) 5 MG immediate release tablet Take 1-2 tablets (5-10 mg total) by mouth every 4 (four) hours as needed for moderate pain or severe pain. 10/10/14   Olivia Mackie Shuford, PA-C  polyethylene glycol (MIRALAX / GLYCOLAX) packet Take 17 g by mouth daily as needed for mild constipation. 02/25/14   Alexzandrew L Perkins, PA-C  potassium chloride (MICRO-K) 10 MEQ CR capsule Take 10 mEq by mouth 3 (three) times a week.    Historical Provider,  MD  prednisoLONE acetate (PRED FORTE) 1 % ophthalmic suspension Place 1 drop into the right eye 2 (two) times daily.    Historical Provider, MD  senna (SENOKOT) 8.6 MG TABS tablet Take 1 tablet by mouth daily as needed for mild constipation.    Historical Provider, MD  sertraline (ZOLOFT) 50 MG tablet Take 50 mg by mouth daily.    Historical Provider, MD  traMADol (ULTRAM) 50 MG tablet Take 1-2 tablets (50-100 mg total) by mouth every 6 (six) hours as needed (mild pain). Patient not taking: Reported on 10/02/2014 02/25/14   Alexzandrew L Perkins, PA-C  traMADol (ULTRAM) 50 MG tablet Take  1 tablet (50 mg total) by mouth every 6 (six) hours as needed (mild pain). Patient not taking: Reported on 12/20/2014 10/10/14   Jenetta Loges, PA-C  Vitamin D, Ergocalciferol, (DRISDOL) 50000 UNITS CAPS capsule Take 50,000 Units by mouth every 7 (seven) days.    Historical Provider, MD    Family History Family History  Problem Relation Age of Onset  . Stroke Mother   . Hypertension Mother   . Ovarian cancer Sister     Social History Social History  Substance Use Topics  . Smoking status: Never Smoker  . Smokeless tobacco: Never Used  . Alcohol use Yes     Comment: rare glass of wine      Allergies   Robaxin [methocarbamol]; Ancef [cefazolin]; Morphine and related; Penicillins; and Flurbiprofen   Review of Systems Review of Systems  All other systems reviewed and are negative.    Physical Exam Updated Vital Signs BP (!) 142/51 (BP Location: Right Arm)   Pulse 72   Temp 98.1 F (36.7 C) (Oral)   Resp 16   Ht 5\' 4"  (1.626 m)   Wt 137 lb (62.1 kg)   SpO2 98%   BMI 23.52 kg/m   Physical Exam  Constitutional: She is oriented to person, place, and time. She appears well-developed and well-nourished. No distress.  HENT:  Head: Normocephalic and atraumatic.  Mouth/Throat: Oropharynx is clear and moist.  Eyes: Conjunctivae and EOM are normal. Pupils are equal, round, and reactive to light.    Neck: Normal range of motion. Neck supple.  Cardiovascular: Normal rate, regular rhythm and intact distal pulses.   No murmur heard. Pulmonary/Chest: Effort normal and breath sounds normal. No respiratory distress. She has no wheezes. She has no rales.  Abdominal: Soft. She exhibits no distension. There is no tenderness. There is no rebound and no guarding.  Musculoskeletal: Normal range of motion. She exhibits tenderness. She exhibits no edema.       Right shoulder: She exhibits tenderness. She exhibits normal range of motion, no deformity, no spasm, normal pulse and normal strength.       Right hip: She exhibits normal range of motion, normal strength and no deformity.       Right knee: She exhibits swelling, effusion and bony tenderness. She exhibits no deformity. Tenderness found. Lateral joint line tenderness noted.       Legs: Neurological: She is alert and oriented to person, place, and time.  Skin: Skin is warm and dry. No rash noted. No erythema.  Psychiatric: She has a normal mood and affect. Her behavior is normal.  Nursing note and vitals reviewed.    ED Treatments / Results  Labs (all labs ordered are listed, but only abnormal results are displayed) Labs Reviewed - No data to display  EKG  EKG Interpretation None       Radiology Dg Shoulder Right  Result Date: 08/06/2016 CLINICAL DATA:  Right shoulder pain since a fall 2 days ago. Initial encounter. EXAM: RIGHT SHOULDER - 2+ VIEW COMPARISON:  None. FINDINGS: No acute bony or joint abnormality is identified. Reverse shoulder arthroplasty is in place. Hardware is intact. There is some heterotopic calcification of the inferior aspect of the glenoid. IMPRESSION: No acute abnormality. Electronically Signed   By: Inge Rise M.D.   On: 08/06/2016 15:43   Dg Knee Complete 4 Views Right  Result Date: 08/06/2016 CLINICAL DATA:  Right knee pain since a fall 2 days ago. Initial encounter. EXAM: RIGHT KNEE - COMPLETE 4+  VIEW COMPARISON:  None. FINDINGS: Moderate joint effusion is seen. No fracture is identified. Bones are osteopenic. Moderate tricompartmental degenerative disease is seen. Sclerotic lesion in the diaphysis of the distal femur is compatible with an enchondroma or possibly remote medullary infarct. Atherosclerosis noted. IMPRESSION: Negative for fracture. Moderate joint effusion. Osteopenia. Osteoarthritis. Electronically Signed   By: Inge Rise M.D.   On: 08/06/2016 15:46   Dg Hip Unilat  With Pelvis 2-3 Views Right  Result Date: 08/06/2016 CLINICAL DATA:  Initial encounter.  83 year old status post fall. EXAM: DG HIP (WITH OR WITHOUT PELVIS) 2-3V RIGHT COMPARISON:  None. FINDINGS: There is no evidence of hip fracture or dislocation. Mild to moderate right hip osteoarthritis. Previous left hip arthroplasty. There is advanced degenerative disc disease and scoliosis within the lumbar spine. IMPRESSION: 1. No evidence for right hip fracture or dislocation. If there is high clinical suspicion for occult fracture or the patient refuses to weightbear, consider further evaluation with MRI. Although CT is expeditious, evidence is lacking regarding accuracy of CT over plain film radiography. Electronically Signed   By: Kerby Moors M.D.   On: 08/06/2016 15:44    Procedures Procedures (including critical care time)  Medications Ordered in ED Medications - No data to display   Initial Impression / Assessment and Plan / ED Course  I have reviewed the triage vital signs and the nursing notes.  Pertinent labs & imaging results that were available during my care of the patient were reviewed by me and considered in my medical decision making (see chart for details).    Patient is an 83 year old female with a mechanical fall yesterday presenting today to be evaluated for right knee hip and shoulder pain. She denies any head injury or LOC. Patient has been able to ambulate but it is more painful. She's been  taking Percocet at home which she is prescribed some improvement in her pain. Suspicion for hip fracture and plain films are negative. Patient is able to stand and pivot. She does have significant right knee effusion and pain with range of motion. X-ray shows a large knee effusion but no bony abnormality. Patient was placed in a knee sleeve and shoulder imaging is negative. Patient was recommended follow-up with her orthopedist next week for recheck. She has plenty of pain medication at home and her husband is here also to help her if she has problems.  Final Clinical Impressions(s) / ED Diagnoses   Final diagnoses:  Fall, initial encounter  Knee effusion, right  Contusion of hip and thigh, right, initial encounter    New Prescriptions New Prescriptions   No medications on file     Blanchie Dessert, MD 08/06/16 1729

## 2016-08-06 NOTE — ED Notes (Signed)
Unable to obtain VS from patient.  Needed to be D/C'd ASAP as patient and husband were unable to drive in the dark.  Assisted patient and spouse into wheelchairs and escorted to the lobby where they were picked up by valet.

## 2016-08-12 ENCOUNTER — Ambulatory Visit: Payer: Medicare Other | Admitting: Podiatry

## 2016-08-18 ENCOUNTER — Ambulatory Visit (INDEPENDENT_AMBULATORY_CARE_PROVIDER_SITE_OTHER): Payer: Medicare Other | Admitting: Podiatry

## 2016-08-18 ENCOUNTER — Encounter: Payer: Self-pay | Admitting: Podiatry

## 2016-08-18 VITALS — BP 149/75 | HR 70 | Resp 18

## 2016-08-18 DIAGNOSIS — M2011 Hallux valgus (acquired), right foot: Secondary | ICD-10-CM

## 2016-08-18 DIAGNOSIS — M2041 Other hammer toe(s) (acquired), right foot: Secondary | ICD-10-CM | POA: Diagnosis not present

## 2016-08-18 DIAGNOSIS — L84 Corns and callosities: Secondary | ICD-10-CM | POA: Diagnosis not present

## 2016-08-18 DIAGNOSIS — B351 Tinea unguium: Secondary | ICD-10-CM | POA: Diagnosis not present

## 2016-08-18 NOTE — Patient Instructions (Signed)
Today your examination revealed adequate circulation with mild decreased feeling in your feet The discomfort between your left great toe and second left toe is also from the bunion on the left foot rubbing against the hammertoe. This creates chronic friction and rub as well as a local corn in the area I trim the corn on the inside of the left great toe All the toenails 10 were debrided Please where the silicone insert in between the left great toe and the second left toe and ongoing daily basis Return if the symptoms do not improve in the next 8 weeks  Diabetes and Foot Care Diabetes may cause you to have problems because of poor blood supply (circulation) to your feet and legs. This may cause the skin on your feet to become thinner, break easier, and heal more slowly. Your skin may become dry, and the skin may peel and crack. You may also have nerve damage in your legs and feet causing decreased feeling in them. You may not notice minor injuries to your feet that could lead to infections or more serious problems. Taking care of your feet is one of the most important things you can do for yourself. Follow these instructions at home:  Wear shoes at all times, even in the house. Do not go barefoot. Bare feet are easily injured.  Check your feet daily for blisters, cuts, and redness. If you cannot see the bottom of your feet, use a mirror or ask someone for help.  Wash your feet with warm water (do not use hot water) and mild soap. Then pat your feet and the areas between your toes until they are completely dry. Do not soak your feet as this can dry your skin.  Apply a moisturizing lotion or petroleum jelly (that does not contain alcohol and is unscented) to the skin on your feet and to dry, brittle toenails. Do not apply lotion between your toes.  Trim your toenails straight across. Do not dig under them or around the cuticle. File the edges of your nails with an emery board or nail file.  Do  not cut corns or calluses or try to remove them with medicine.  Wear clean socks or stockings every day. Make sure they are not too tight. Do not wear knee-high stockings since they may decrease blood flow to your legs.  Wear shoes that fit properly and have enough cushioning. To break in new shoes, wear them for just a few hours a day. This prevents you from injuring your feet. Always look in your shoes before you put them on to be sure there are no objects inside.  Do not cross your legs. This may decrease the blood flow to your feet.  If you find a minor scrape, cut, or break in the skin on your feet, keep it and the skin around it clean and dry. These areas may be cleansed with mild soap and water. Do not cleanse the area with peroxide, alcohol, or iodine.  When you remove an adhesive bandage, be sure not to damage the skin around it.  If you have a wound, look at it several times a day to make sure it is healing.  Do not use heating pads or hot water bottles. They may burn your skin. If you have lost feeling in your feet or legs, you may not know it is happening until it is too late.  Make sure your health care provider performs a complete foot exam at least annually  or more often if you have foot problems. Report any cuts, sores, or bruises to your health care provider immediately. Contact a health care provider if:  You have an injury that is not healing.  You have cuts or breaks in the skin.  You have an ingrown nail.  You notice redness on your legs or feet.  You feel burning or tingling in your legs or feet.  You have pain or cramps in your legs and feet.  Your legs or feet are numb.  Your feet always feel cold. Get help right away if:  There is increasing redness, swelling, or pain in or around a wound.  There is a red line that goes up your leg.  Pus is coming from a wound.  You develop a fever or as directed by your health care provider.  You notice a bad  smell coming from an ulcer or wound. This information is not intended to replace advice given to you by your health care provider. Make sure you discuss any questions you have with your health care provider. Document Released: 07/02/2000 Document Revised: 12/11/2015 Document Reviewed: 12/12/2012 Elsevier Interactive Patient Education  2017 [employer].

## 2016-08-18 NOTE — Progress Notes (Signed)
   Subjective:    Patient ID: Marissa Ryan, female    DOB: 1925-06-12, 83 y.o.   MRN: [id number]  HPI     This patient presents with approximately 2 month history of pain localized to the left hallux and second left toe area aggravated with weightbearing and direct pressure and shoe wearing and walking. The symptoms have persisted and have worsened over time. Patient has attempted to trim the thickened toenails on the left great toe which provider slight reduction of discomfort in that area.  Patient is diabetic and denies history of foot ulceration, claudication or amputation Patient denies history of smoking  Patient's husband is present in the treatment room  Review of Systems  HENT: Positive for hearing loss.   Cardiovascular: Positive for leg swelling.  Gastrointestinal: Positive for constipation.  Endocrine: Positive for cold intolerance and heat intolerance.  Genitourinary: Positive for frequency and urgency.  Musculoskeletal: Positive for back pain and gait problem.  All other systems reviewed and are negative.      Objective:   Physical Exam    Orientated 3  Vascular: DP pulses 2/4 bilaterally PT pulses 2/4 bilaterally Capillary reflex immediate bilaterally  Neurological: Sensation to 10 g monofilament wire intact 5/5 bilaterally Vibratory sensation nonreactive bilaterally Ankle reflex reactive bilaterally  Dermatological: No open skin lesions bilaterally Atrophic skin bilaterally Corn lateral nailfold left hallux which is tender to direct palpation Toenails 1-5 bilaterally are hypertrophic elongated with deformity  Musculoskeletal: Advanced HAV bilaterally Hammertoe second bilaterally Bunionette's bilaterally Palpable tenderness on the dorsal , lateral, distal left hallux Second left toe tends to overlap hallux Upon weight-bearing and direct palpation the second toe against hallux Cates patient discomfort Unsteady gait with walker     Assessment &  Plan:   Diabetic with satisfactory vascular status Diabetic with mild peripheral neuropathy HAV and overlapping second left toe cause compression and discomfort at point a contact Corn in the lateral left hallux nail groove further increases pain in the area Hypertrophic  left hallux nail further aggravates pain in or on the left hallux Mycotic toenails 6-10  Plan: Reviewed the results of the exam with patient today and recommended debridement of all the toenails including the hallux toenails as well as the corn. Recommended silicone insert to place between left hallux and second left toe to reduce chronic irritation  Toenails 6-10 are debrided mechanically and like without a bleeding Debrided keratoses left hallux 1 without any bleeding Patient advised to return if symptoms do not improve in the next 6-8 weeks

## 2016-08-19 DIAGNOSIS — S8000XA Contusion of unspecified knee, initial encounter: Secondary | ICD-10-CM | POA: Diagnosis not present

## 2016-08-19 DIAGNOSIS — M1711 Unilateral primary osteoarthritis, right knee: Secondary | ICD-10-CM | POA: Diagnosis not present

## 2016-10-06 DIAGNOSIS — M810 Age-related osteoporosis without current pathological fracture: Secondary | ICD-10-CM | POA: Diagnosis not present

## 2016-10-06 DIAGNOSIS — Z6825 Body mass index (BMI) 25.0-25.9, adult: Secondary | ICD-10-CM | POA: Diagnosis not present

## 2016-10-06 DIAGNOSIS — I10 Essential (primary) hypertension: Secondary | ICD-10-CM | POA: Diagnosis not present

## 2016-10-06 DIAGNOSIS — M5489 Other dorsalgia: Secondary | ICD-10-CM | POA: Diagnosis not present

## 2016-10-06 DIAGNOSIS — E119 Type 2 diabetes mellitus without complications: Secondary | ICD-10-CM | POA: Diagnosis not present

## 2016-10-06 DIAGNOSIS — M1990 Unspecified osteoarthritis, unspecified site: Secondary | ICD-10-CM | POA: Diagnosis not present

## 2016-10-11 DIAGNOSIS — L820 Inflamed seborrheic keratosis: Secondary | ICD-10-CM | POA: Diagnosis not present

## 2016-10-11 DIAGNOSIS — Z79891 Long term (current) use of opiate analgesic: Secondary | ICD-10-CM | POA: Diagnosis not present

## 2016-10-11 DIAGNOSIS — M961 Postlaminectomy syndrome, not elsewhere classified: Secondary | ICD-10-CM | POA: Diagnosis not present

## 2016-10-11 DIAGNOSIS — M5136 Other intervertebral disc degeneration, lumbar region: Secondary | ICD-10-CM | POA: Diagnosis not present

## 2016-10-20 DIAGNOSIS — H5711 Ocular pain, right eye: Secondary | ICD-10-CM | POA: Diagnosis not present

## 2016-10-20 DIAGNOSIS — H401134 Primary open-angle glaucoma, bilateral, indeterminate stage: Secondary | ICD-10-CM | POA: Diagnosis not present

## 2016-10-20 DIAGNOSIS — Z961 Presence of intraocular lens: Secondary | ICD-10-CM | POA: Diagnosis not present

## 2016-10-23 DIAGNOSIS — M542 Cervicalgia: Secondary | ICD-10-CM | POA: Diagnosis not present

## 2016-10-28 DIAGNOSIS — M542 Cervicalgia: Secondary | ICD-10-CM | POA: Diagnosis not present

## 2016-10-28 DIAGNOSIS — M5136 Other intervertebral disc degeneration, lumbar region: Secondary | ICD-10-CM | POA: Diagnosis not present

## 2016-10-28 DIAGNOSIS — M961 Postlaminectomy syndrome, not elsewhere classified: Secondary | ICD-10-CM | POA: Diagnosis not present

## 2016-10-28 DIAGNOSIS — S4982XA Other specified injuries of left shoulder and upper arm, initial encounter: Secondary | ICD-10-CM | POA: Diagnosis not present

## 2016-11-11 DIAGNOSIS — I10 Essential (primary) hypertension: Secondary | ICD-10-CM | POA: Diagnosis not present

## 2016-11-11 DIAGNOSIS — M1990 Unspecified osteoarthritis, unspecified site: Secondary | ICD-10-CM | POA: Diagnosis not present

## 2016-11-11 DIAGNOSIS — Z6825 Body mass index (BMI) 25.0-25.9, adult: Secondary | ICD-10-CM | POA: Diagnosis not present

## 2016-11-24 ENCOUNTER — Ambulatory Visit (INDEPENDENT_AMBULATORY_CARE_PROVIDER_SITE_OTHER): Payer: Medicare Other | Admitting: Ophthalmology

## 2016-11-24 DIAGNOSIS — E113211 Type 2 diabetes mellitus with mild nonproliferative diabetic retinopathy with macular edema, right eye: Secondary | ICD-10-CM | POA: Diagnosis not present

## 2016-11-24 DIAGNOSIS — E11311 Type 2 diabetes mellitus with unspecified diabetic retinopathy with macular edema: Secondary | ICD-10-CM | POA: Diagnosis not present

## 2016-11-24 DIAGNOSIS — H35033 Hypertensive retinopathy, bilateral: Secondary | ICD-10-CM

## 2016-11-24 DIAGNOSIS — H43811 Vitreous degeneration, right eye: Secondary | ICD-10-CM

## 2016-11-24 DIAGNOSIS — H35372 Puckering of macula, left eye: Secondary | ICD-10-CM

## 2016-11-24 DIAGNOSIS — E113292 Type 2 diabetes mellitus with mild nonproliferative diabetic retinopathy without macular edema, left eye: Secondary | ICD-10-CM

## 2016-11-24 DIAGNOSIS — I10 Essential (primary) hypertension: Secondary | ICD-10-CM

## 2016-11-24 DIAGNOSIS — H59031 Cystoid macular edema following cataract surgery, right eye: Secondary | ICD-10-CM

## 2016-12-07 DIAGNOSIS — L03113 Cellulitis of right upper limb: Secondary | ICD-10-CM | POA: Diagnosis not present

## 2016-12-17 DIAGNOSIS — L03113 Cellulitis of right upper limb: Secondary | ICD-10-CM | POA: Diagnosis not present

## 2016-12-21 DIAGNOSIS — M542 Cervicalgia: Secondary | ICD-10-CM | POA: Diagnosis not present

## 2016-12-21 DIAGNOSIS — M5136 Other intervertebral disc degeneration, lumbar region: Secondary | ICD-10-CM | POA: Diagnosis not present

## 2017-01-11 DIAGNOSIS — R05 Cough: Secondary | ICD-10-CM | POA: Diagnosis not present

## 2017-01-11 DIAGNOSIS — J069 Acute upper respiratory infection, unspecified: Secondary | ICD-10-CM | POA: Diagnosis not present

## 2017-01-11 DIAGNOSIS — Z6825 Body mass index (BMI) 25.0-25.9, adult: Secondary | ICD-10-CM | POA: Diagnosis not present

## 2017-01-15 ENCOUNTER — Emergency Department (HOSPITAL_COMMUNITY): Payer: Medicare Other

## 2017-01-15 ENCOUNTER — Encounter (HOSPITAL_COMMUNITY): Payer: Self-pay | Admitting: Emergency Medicine

## 2017-01-15 ENCOUNTER — Emergency Department (HOSPITAL_COMMUNITY)
Admission: EM | Admit: 2017-01-15 | Discharge: 2017-01-15 | Disposition: A | Discharge status: Home / Self Care | Payer: Medicare Other | Attending: Emergency Medicine | Admitting: Emergency Medicine

## 2017-01-15 DIAGNOSIS — R079 Chest pain, unspecified: Secondary | ICD-10-CM | POA: Diagnosis not present

## 2017-01-15 DIAGNOSIS — Z96642 Presence of left artificial hip joint: Secondary | ICD-10-CM | POA: Diagnosis not present

## 2017-01-15 DIAGNOSIS — Z85828 Personal history of other malignant neoplasm of skin: Secondary | ICD-10-CM | POA: Diagnosis not present

## 2017-01-15 DIAGNOSIS — Z96611 Presence of right artificial shoulder joint: Secondary | ICD-10-CM | POA: Diagnosis not present

## 2017-01-15 DIAGNOSIS — Z7982 Long term (current) use of aspirin: Secondary | ICD-10-CM | POA: Diagnosis not present

## 2017-01-15 DIAGNOSIS — J189 Pneumonia, unspecified organism: Secondary | ICD-10-CM | POA: Insufficient documentation

## 2017-01-15 DIAGNOSIS — E119 Type 2 diabetes mellitus without complications: Secondary | ICD-10-CM | POA: Insufficient documentation

## 2017-01-15 DIAGNOSIS — Z7984 Long term (current) use of oral hypoglycemic drugs: Secondary | ICD-10-CM | POA: Insufficient documentation

## 2017-01-15 DIAGNOSIS — R05 Cough: Secondary | ICD-10-CM | POA: Diagnosis present

## 2017-01-15 DIAGNOSIS — R9431 Abnormal electrocardiogram [ECG] [EKG]: Secondary | ICD-10-CM | POA: Diagnosis not present

## 2017-01-15 DIAGNOSIS — I10 Essential (primary) hypertension: Secondary | ICD-10-CM | POA: Insufficient documentation

## 2017-01-15 DIAGNOSIS — Z79899 Other long term (current) drug therapy: Secondary | ICD-10-CM | POA: Insufficient documentation

## 2017-01-15 LAB — I-STAT TROPONIN, ED: TROPONIN I, POC: 0 ng/mL (ref 0.00–0.08)

## 2017-01-15 LAB — BASIC METABOLIC PANEL
Anion gap: 8 (ref 5–15)
BUN: 12 mg/dL (ref 6–20)
CALCIUM: 9.2 mg/dL (ref 8.9–10.3)
CO2: 26 mmol/L (ref 22–32)
Chloride: 103 mmol/L (ref 101–111)
Creatinine, Ser: 0.76 mg/dL (ref 0.44–1.00)
GFR calc Af Amer: >60 mL/min (ref >60)
GLUCOSE: 122 mg/dL — AB (ref 65–99)
Potassium: 4.6 mmol/L (ref 3.5–5.1)
Sodium: 137 mmol/L (ref 135–145)

## 2017-01-15 LAB — CBC
HCT: 34.8 % — ABNORMAL LOW (ref 36.0–46.0)
HEMOGLOBIN: 11.5 g/dL — AB (ref 12.0–15.0)
MCH: 31.2 pg (ref 26.0–34.0)
MCHC: 33 g/dL (ref 30.0–36.0)
MCV: 94.3 fL (ref 78.0–100.0)
Platelets: 316 10*3/uL (ref 150–400)
RBC: 3.69 MIL/uL — ABNORMAL LOW (ref 3.87–5.11)
RDW: 13.1 % (ref 11.5–15.5)
WBC: 9.6 10*3/uL (ref 4.0–10.5)

## 2017-01-15 MED ORDER — AZITHROMYCIN 250 MG PO TABS: 250.0000 mg | ORAL_TABLET | Freq: Every day | ORAL | 0 refills | Status: DC

## 2017-01-15 MED ORDER — AZITHROMYCIN 250 MG PO TABS
500.0000 mg | ORAL_TABLET | Freq: Once | ORAL | Status: AC
  Administered 2017-01-15: 500 mg via ORAL
  Filled 2017-01-15: qty 2

## 2017-01-15 NOTE — ED Provider Notes (Signed)
[facility] DEPT Provider Note   CSN: [id number] Arrival date & time: 01/15/17  1227     History   Chief Complaint Chief Complaint  Patient presents with  . Chest Pain    HPI Marissa Ryan is a 83 y.o. female.  The history is provided by the patient.  Cough  This is a new problem. The current episode started more than 2 days ago. The problem occurs constantly. The problem has been gradually worsening. The cough is productive of sputum. The maximum temperature recorded prior to her arrival was 100 to 100.9 F. Associated symptoms include chest pain, ear congestion and rhinorrhea. She has tried nothing for the symptoms. The treatment provided no relief. She is not a smoker.    Past Medical History:  Diagnosis Date  . Anxiety   . Arthritis   . Cancer ([place])    hx of skin cancer removal on hand   . Diabetes mellitus without complication ([place])   . Gallstones   . Hyperlipidemia   . Hypertension   . IBS (irritable bowel syndrome)   . Osteoporosis     Patient Active Problem List   Diagnosis Date Noted  . 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 OA (osteoarthritis) of hip 02/20/2014  . Essential hypertension 12/03/2013  . Hyperlipidemia 12/03/2013  . Diabetes ([place]) 12/03/2013    Past Surgical History:  Procedure Laterality Date  . APPENDECTOMY    . BACK SURGERY     x2  . cataract surgery      bilateral   . CHOLECYSTECTOMY  1999  . EYE SURGERY     for glaucoma   . left eye peeling surgery     . left femur bone surgery      1978  . REVERSE SHOULDER ARTHROPLASTY Right 10/10/2014   Procedure: RIGHT REVERSE SHOULDER ARTHROPLASTY;  Surgeon: Justice Britain, MD;  Location: [facility];  Service: Orthopedics;  Laterality: Right;  . ROTATOR CUFF REPAIR    . TOTAL HIP ARTHROPLASTY Left 02/20/2014   Procedure: LEFT TOTAL HIP ARTHROPLASTY ANTERIOR APPROACH;  Surgeon: Gearlean Alf, MD;  Location: [facility];  Service: Orthopedics;  Laterality: Left;  . WRIST SURGERY     left     OB History    No data available       Home Medications    Prior to Admission medications   Medication Sig Start Date End Date Taking? Authorizing Provider  ALPRAZolam (XANAX) 0.25 MG tablet Take 0.25 mg by mouth at bedtime.    Yes [provider]  amLODipine (NORVASC) 5 MG tablet Take 5 mg by mouth daily.  12/30/16  Yes [provider]  aspirin EC 81 MG tablet Take 81 mg by mouth daily.   Yes [provider]  ezetimibe-simvastatin (VYTORIN) 10-40 MG per tablet Take 0.5 tablets by mouth at bedtime.    Yes [provider]  furosemide (LASIX) 20 MG tablet Take 20 mg by mouth daily.    Yes [provider]  gabapentin (NEURONTIN) 300 MG capsule Take 300 mg by mouth 3 (three) times daily as needed (pain).    Yes [provider]  losartan (COZAAR) 50 MG tablet Take 50 mg by mouth daily with supper.    Yes [provider]  metFORMIN (GLUCOPHAGE) 500 MG tablet Take 500 mg by mouth daily with breakfast.   Yes [provider]  oxyCODONE-acetaminophen (PERCOCET) 10-325 MG tablet Take 1 tablet by mouth every 6 (six) hours as needed for pain.  12/22/16  Yes [provider]  valsartan (DIOVAN) 320 MG tablet Take 320 mg by mouth daily.  01/10/17  Yes [provider]  azithromycin (ZITHROMAX) 250 MG tablet Take 1 tablet (250 mg total) by mouth daily. Take 1 every day until finished. 01/15/17   Finlay Godbee, Corene Cornea, MD  Cinnamon 500 MG capsule Take 500 mg by mouth daily.    [provider]  docusate sodium 100 MG CAPS Take 100 mg by mouth 2 (two) times daily. Patient taking differently: Take 100 mg by mouth daily as needed (constipation).  02/25/14   Perkins, Alexzandrew L, PA-C    Family History Family History  Problem Relation Age of Onset  . Stroke Mother   . Hypertension Mother   . Ovarian cancer Sister     Social History Social History  Substance Use Topics  . Smoking status: Never Smoker  .  Smokeless tobacco: Never Used  . Alcohol use Yes     Comment: rare glass of wine      Allergies   Robaxin [methocarbamol]; Ancef [cefazolin]; Morphine and related; Penicillins; and Flurbiprofen   Review of Systems Review of Systems  HENT: Positive for rhinorrhea.   Respiratory: Positive for cough.   Cardiovascular: Positive for chest pain.  All other systems reviewed and are negative.    Physical Exam Updated Vital Signs BP 133/64   Pulse 72   Temp 97.6 F (36.4 C) (Oral)   Resp (!) 21   Ht 5\' 4"  (1.626 m)   Wt 64 kg (141 lb)   SpO2 99%   BMI 24.20 kg/m   Physical Exam  Constitutional: She appears well-developed and well-nourished.  HENT:  Head: Normocephalic and atraumatic.  Eyes: Conjunctivae and EOM are normal.  Neck: Normal range of motion.  Cardiovascular: Normal rate and regular rhythm.   Pulmonary/Chest: No stridor. Tachypnea noted. No respiratory distress. She has rales (bilateral lower lobes, L>R).  Abdominal: She exhibits no distension.  Neurological: She is alert.  Nursing note and vitals reviewed.    ED Treatments / Results  Labs (all labs ordered are listed, but only abnormal results are displayed) Labs Reviewed  BASIC METABOLIC PANEL - Abnormal; Notable for the following:       Result Value   Glucose, Bld 122 (*)    All other components within normal limits  CBC - Abnormal; Notable for the following:    RBC 3.69 (*)    Hemoglobin 11.5 (*)    HCT 34.8 (*)    All other components within normal limits  I-STAT TROPOININ, ED    EKG  EKG Interpretation  Date/Time:  Saturday January 15 2017 12:43:28 EDT Ventricular Rate:  73 PR Interval:    QRS Duration: 87 QT Interval:  371 QTC Calculation: 409 R Axis:   33 Text Interpretation:  Sinus rhythm No significant change since last tracing Confirmed by Merrily Pew [phone]) on 01/15/2017 1:17:11 PM       Radiology Dg Chest 2 View  Result Date: 01/15/2017 CLINICAL DATA:  Right-sided chest  pain. EXAM: CHEST  2 VIEW COMPARISON:  02/12/2014 FINDINGS: Cardiomediastinal silhouette is normal. Mediastinal contours appear intact. Calcific atherosclerotic disease of the aorta. There is no evidence of focal airspace consolidation, pleural effusion or pneumothorax. Osteoarthritic changes of the thoraco lumbar spine with chronic mild compression deformity of L1 vertebral body. Right humeral prosthesis. Soft tissues are grossly normal. IMPRESSION: No active cardiopulmonary disease. Calcific atherosclerotic disease of the aorta. Electronically Signed   By: Linwood Dibbles.D.  On: 01/15/2017 13:13    Procedures Procedures (including critical care time)  Medications Ordered in ED Medications  azithromycin (ZITHROMAX) tablet 500 mg (500 mg Oral Given 01/15/17 1445)     Initial Impression / Assessment and Plan / ED Course  I have reviewed the triage vital signs and the nursing notes.  Pertinent labs & imaging results that were available during my care of the patient were reviewed by me and considered in my medical decision making (see chart for details).     Low suspicion for ACS. Pain is mostly right sided and related coughing, nasal congestion, etc. Has L>R lower lobe rales along with what appears to be LLL opacity on CXR with a story concerning for CAP, will treat for same. No hypoxia, resp distress to necessitate inpatient admission.  Doubt PE, PTX or other more emergent cause at this time.    Final Clinical Impressions(s) / ED Diagnoses   Final diagnoses:  Community acquired pneumonia, unspecified laterality    New Prescriptions Discharge Medication List as of 01/15/2017  3:23 PM    START taking these medications   Details  azithromycin (ZITHROMAX) 250 MG tablet Take 1 tablet (250 mg total) by mouth daily. Take 1 every day until finished., Starting Sat 01/15/2017, Print         Romy Mcgue, Corene Cornea, MD 01/16/17 1558

## 2017-01-15 NOTE — ED Notes (Signed)
Unable to collect labs patient had to go to the restroom

## 2017-01-15 NOTE — ED Triage Notes (Signed)
Pt began having right sided chest pain on Thursday that has progressed and has increased her baseline weakness.  Pt c/o pain in Right lower chest.

## 2017-01-20 DIAGNOSIS — Z6825 Body mass index (BMI) 25.0-25.9, adult: Secondary | ICD-10-CM | POA: Diagnosis not present

## 2017-01-20 DIAGNOSIS — J069 Acute upper respiratory infection, unspecified: Secondary | ICD-10-CM | POA: Diagnosis not present

## 2017-01-20 DIAGNOSIS — M47812 Spondylosis without myelopathy or radiculopathy, cervical region: Secondary | ICD-10-CM | POA: Diagnosis not present

## 2017-01-20 DIAGNOSIS — M961 Postlaminectomy syndrome, not elsewhere classified: Secondary | ICD-10-CM | POA: Diagnosis not present

## 2017-01-20 DIAGNOSIS — M5136 Other intervertebral disc degeneration, lumbar region: Secondary | ICD-10-CM | POA: Diagnosis not present

## 2017-01-20 DIAGNOSIS — M5489 Other dorsalgia: Secondary | ICD-10-CM | POA: Diagnosis not present

## 2017-01-26 ENCOUNTER — Encounter: Payer: Self-pay | Admitting: Podiatry

## 2017-01-26 ENCOUNTER — Ambulatory Visit (INDEPENDENT_AMBULATORY_CARE_PROVIDER_SITE_OTHER): Payer: Medicare Other | Admitting: Podiatry

## 2017-01-26 ENCOUNTER — Ambulatory Visit (INDEPENDENT_AMBULATORY_CARE_PROVIDER_SITE_OTHER): Payer: Medicare Other

## 2017-01-26 VITALS — BP 110/74 | HR 67

## 2017-01-26 DIAGNOSIS — R52 Pain, unspecified: Secondary | ICD-10-CM

## 2017-01-26 DIAGNOSIS — S92354A Nondisplaced fracture of fifth metatarsal bone, right foot, initial encounter for closed fracture: Secondary | ICD-10-CM

## 2017-01-26 NOTE — Progress Notes (Signed)
This patient presents the office with chief complaint of pain on the outside of her right foot. She says that she started having pain over the weekend on Saturday.  She says she has no history of trauma or injury to the foot.  She says she experiences pain walking on her right foot and the pain is increasing in severity.  She does relate that the area is swollen and there is pain extending all the way up to the fifth toe right foot.  She presents the office today for an evaluation and treatment of a painful right foot  GENERAL APPEARANCE: Alert, conversant. Appropriately groomed. No acute distress.  VASCULAR: Pedal pulses are  palpable at  [facility] and PT bilateral.  Capillary refill time is immediate to all digits,  Normal temperature gradient.  Digital hair growth is present bilateral  NEUROLOGIC: sensation is normal to 5.07 monofilament at 5/5 sites bilateral.  Light touch is intact bilateral, Muscle strength normal.  MUSCULOSKELETAL: acceptable muscle strength, tone and stability bilateral.  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  Hammer toe  B/L.  Tailor bunion  B/L.  there is increased redness, swelling and pain noted at the dorsal aspect of the fifth meta-base.  There is also pain noted upon palpation of the plantar fifth metatarsal right foot.    DERMATOLOGIC: skin color, texture, and turgor are within normal limits.  No preulcerative lesions or ulcers  are seen, no interdigital maceration noted.  No open lesions present.  Digital nails are asymptomatic. No drainage noted.  Fracture fifth metatarsal right foot.    ROV  x-rays taken reveal a fracture noted through the cortical bone on the plantar aspect of the fifth meta-base, right foot.  This is a closed nondisplaced fracture.  Patient was treated with an Haematologist and a surgical shoe.  She is to return the office in one week for further evaluation of her right foot.  She says she has pain medicine at home and does not need pain medicine.  RTC 1 week.   Gardiner Barefoot DPM

## 2017-02-01 DIAGNOSIS — H353122 Nonexudative age-related macular degeneration, left eye, intermediate dry stage: Secondary | ICD-10-CM | POA: Diagnosis not present

## 2017-02-01 DIAGNOSIS — M316 Other giant cell arteritis: Secondary | ICD-10-CM | POA: Diagnosis not present

## 2017-02-01 DIAGNOSIS — H401134 Primary open-angle glaucoma, bilateral, indeterminate stage: Secondary | ICD-10-CM | POA: Diagnosis not present

## 2017-02-01 DIAGNOSIS — Z961 Presence of intraocular lens: Secondary | ICD-10-CM | POA: Diagnosis not present

## 2017-02-01 DIAGNOSIS — H5711 Ocular pain, right eye: Secondary | ICD-10-CM | POA: Diagnosis not present

## 2017-02-02 ENCOUNTER — Ambulatory Visit (INDEPENDENT_AMBULATORY_CARE_PROVIDER_SITE_OTHER): Payer: Medicare Other | Admitting: Podiatry

## 2017-02-02 ENCOUNTER — Encounter: Payer: Self-pay | Admitting: Podiatry

## 2017-02-02 ENCOUNTER — Ambulatory Visit (INDEPENDENT_AMBULATORY_CARE_PROVIDER_SITE_OTHER): Payer: Medicare Other

## 2017-02-02 DIAGNOSIS — S92354D Nondisplaced fracture of fifth metatarsal bone, right foot, subsequent encounter for fracture with routine healing: Secondary | ICD-10-CM | POA: Diagnosis not present

## 2017-02-02 NOTE — Progress Notes (Signed)
This patient presents the office 1 week following a diagnosis of a fracture on the plantar aspect of the fifth metatarsal right foot.  She has been wearing an Haematologist and walking in a surgical shoe for the last week.  She presents the office today for removal of the Unna boot and reevaluation of her right foot.    Neurovascular status is intact as noted on 01/26/2017.  There is minimal redness and swelling noted at the fifth meta-base, right foot. There is still pain elicited on pressure to the plantar aspect of the fifth metatarsal right foot  Diagnosis  fracture fifth metatarsal right foot   Return office visit.  X-rays taken reveal the fracture is still only through the plantar cortex right foot.  The Unna boot was removed and patient was told to ambulate with the surgical shoe only.  I applied some padding to soften the inside of the surgical shoe.  She was told activity to tolerance with the shoe.  Return to clinic in 3 weeks for further evaluation and treatment   Gardiner Barefoot DPM

## 2017-02-03 ENCOUNTER — Ambulatory Visit: Payer: Medicare Other | Admitting: Podiatry

## 2017-02-08 ENCOUNTER — Ambulatory Visit (INDEPENDENT_AMBULATORY_CARE_PROVIDER_SITE_OTHER): Payer: Medicare Other

## 2017-02-08 ENCOUNTER — Encounter: Payer: Self-pay | Admitting: Podiatry

## 2017-02-08 ENCOUNTER — Ambulatory Visit (INDEPENDENT_AMBULATORY_CARE_PROVIDER_SITE_OTHER): Payer: Medicare Other | Admitting: Podiatry

## 2017-02-08 DIAGNOSIS — M79671 Pain in right foot: Secondary | ICD-10-CM

## 2017-02-08 DIAGNOSIS — S92354D Nondisplaced fracture of fifth metatarsal bone, right foot, subsequent encounter for fracture with routine healing: Secondary | ICD-10-CM

## 2017-02-08 DIAGNOSIS — R52 Pain, unspecified: Secondary | ICD-10-CM

## 2017-02-08 NOTE — Progress Notes (Signed)
This patient presents the office 1 week following a diagnosis of a fracture on the plantar aspect of the fifth metatarsal right foot.  She was seen last week and told to ambulate with a surgical shoe.  She says that she started to have pain in the afternoon and difficulty sleeping at night. She returns the office stating that there is pain that is uncomfortable and she desires an evaluation and treatment of her closed fracture, right foot  Neurovascular status is intact as noted on 01/26/2017.  There is minimal redness and swelling noted at the fifth meta-base, right.  Pain radiates up to the fifth toe of the right foot.  Diagnosis  fracture fifth metatarsal right foot   Return office visit.  After discussion with this patient. We decided to re-apply an Unna boot for her to wear.  She is to continue to ambulate in her surgical shoe.  She says she has pain medicine to take at night as needed.  She should return the office in 2 weeks at which time the [facility] boot will then be removed.  Examination of the x-ray reveals that the fracture has worsened and has spread dorsally to the top of the metatarsal.   Gardiner Barefoot DPM

## 2017-02-11 DIAGNOSIS — S92354A Nondisplaced fracture of fifth metatarsal bone, right foot, initial encounter for closed fracture: Secondary | ICD-10-CM | POA: Diagnosis not present

## 2017-02-23 ENCOUNTER — Other Ambulatory Visit (HOSPITAL_COMMUNITY): Payer: Self-pay | Admitting: Internal Medicine

## 2017-02-23 ENCOUNTER — Other Ambulatory Visit: Payer: Self-pay | Admitting: Internal Medicine

## 2017-02-23 DIAGNOSIS — H547 Unspecified visual loss: Secondary | ICD-10-CM

## 2017-02-23 DIAGNOSIS — I10 Essential (primary) hypertension: Secondary | ICD-10-CM

## 2017-02-24 ENCOUNTER — Ambulatory Visit (HOSPITAL_COMMUNITY)
Admission: RE | Admit: 2017-02-24 | Discharge: 2017-02-24 | Disposition: A | Discharge status: Home / Self Care | Payer: Medicare Other | Source: Ambulatory Visit | Attending: Vascular Surgery | Admitting: Vascular Surgery

## 2017-02-24 ENCOUNTER — Other Ambulatory Visit: Payer: Self-pay

## 2017-02-24 ENCOUNTER — Ambulatory Visit (HOSPITAL_BASED_OUTPATIENT_CLINIC_OR_DEPARTMENT_OTHER): Payer: Medicare Other

## 2017-02-24 DIAGNOSIS — E785 Hyperlipidemia, unspecified: Secondary | ICD-10-CM | POA: Insufficient documentation

## 2017-02-24 DIAGNOSIS — E119 Type 2 diabetes mellitus without complications: Secondary | ICD-10-CM | POA: Diagnosis not present

## 2017-02-24 DIAGNOSIS — I10 Essential (primary) hypertension: Secondary | ICD-10-CM | POA: Diagnosis not present

## 2017-02-24 DIAGNOSIS — I119 Hypertensive heart disease without heart failure: Secondary | ICD-10-CM | POA: Insufficient documentation

## 2017-02-24 DIAGNOSIS — F419 Anxiety disorder, unspecified: Secondary | ICD-10-CM | POA: Diagnosis not present

## 2017-02-24 DIAGNOSIS — H547 Unspecified visual loss: Secondary | ICD-10-CM

## 2017-02-24 LAB — VAS US CAROTID
LCCADDIAS: 20 cm/s
LCCADSYS: 76 cm/s
LCCAPDIAS: 17 cm/s
LICADDIAS: -35 cm/s
LICADSYS: -105 cm/s
LICAPDIAS: -17 cm/s
Left CCA prox sys: 96 cm/s
Left ICA prox sys: -76 cm/s
RCCADSYS: -100 cm/s
RIGHT CCA MID DIAS: -20 cm/s
RIGHT ECA DIAS: -10 cm/s
Right CCA prox dias: -17 cm/s
Right CCA prox sys: -96 cm/s

## 2017-02-25 DIAGNOSIS — H353122 Nonexudative age-related macular degeneration, left eye, intermediate dry stage: Secondary | ICD-10-CM | POA: Diagnosis not present

## 2017-02-25 DIAGNOSIS — Z961 Presence of intraocular lens: Secondary | ICD-10-CM | POA: Diagnosis not present

## 2017-02-25 DIAGNOSIS — H401134 Primary open-angle glaucoma, bilateral, indeterminate stage: Secondary | ICD-10-CM | POA: Diagnosis not present

## 2017-02-25 DIAGNOSIS — H5711 Ocular pain, right eye: Secondary | ICD-10-CM | POA: Diagnosis not present

## 2017-03-02 ENCOUNTER — Ambulatory Visit: Payer: Medicare Other | Admitting: Podiatry

## 2017-03-03 ENCOUNTER — Ambulatory Visit: Payer: Medicare Other | Admitting: Podiatry

## 2017-03-07 ENCOUNTER — Encounter (INDEPENDENT_AMBULATORY_CARE_PROVIDER_SITE_OTHER): Payer: Medicare Other | Admitting: Ophthalmology

## 2017-03-07 DIAGNOSIS — I10 Essential (primary) hypertension: Secondary | ICD-10-CM | POA: Diagnosis not present

## 2017-03-07 DIAGNOSIS — E113392 Type 2 diabetes mellitus with moderate nonproliferative diabetic retinopathy without macular edema, left eye: Secondary | ICD-10-CM | POA: Diagnosis not present

## 2017-03-07 DIAGNOSIS — H43813 Vitreous degeneration, bilateral: Secondary | ICD-10-CM

## 2017-03-07 DIAGNOSIS — E113311 Type 2 diabetes mellitus with moderate nonproliferative diabetic retinopathy with macular edema, right eye: Secondary | ICD-10-CM | POA: Diagnosis not present

## 2017-03-07 DIAGNOSIS — H35033 Hypertensive retinopathy, bilateral: Secondary | ICD-10-CM | POA: Diagnosis not present

## 2017-03-07 DIAGNOSIS — H59031 Cystoid macular edema following cataract surgery, right eye: Secondary | ICD-10-CM | POA: Diagnosis not present

## 2017-03-07 DIAGNOSIS — E11311 Type 2 diabetes mellitus with unspecified diabetic retinopathy with macular edema: Secondary | ICD-10-CM

## 2017-03-16 DIAGNOSIS — S92354D Nondisplaced fracture of fifth metatarsal bone, right foot, subsequent encounter for fracture with routine healing: Secondary | ICD-10-CM | POA: Diagnosis not present

## 2017-03-22 DIAGNOSIS — E559 Vitamin D deficiency, unspecified: Secondary | ICD-10-CM | POA: Diagnosis not present

## 2017-03-22 DIAGNOSIS — N182 Chronic kidney disease, stage 2 (mild): Secondary | ICD-10-CM | POA: Diagnosis not present

## 2017-03-22 DIAGNOSIS — E784 Other hyperlipidemia: Secondary | ICD-10-CM | POA: Diagnosis not present

## 2017-03-22 DIAGNOSIS — N390 Urinary tract infection, site not specified: Secondary | ICD-10-CM | POA: Diagnosis not present

## 2017-03-22 DIAGNOSIS — E119 Type 2 diabetes mellitus without complications: Secondary | ICD-10-CM | POA: Diagnosis not present

## 2017-03-22 DIAGNOSIS — R8299 Other abnormal findings in urine: Secondary | ICD-10-CM | POA: Diagnosis not present

## 2017-03-23 DIAGNOSIS — M47812 Spondylosis without myelopathy or radiculopathy, cervical region: Secondary | ICD-10-CM | POA: Diagnosis not present

## 2017-03-29 DIAGNOSIS — M810 Age-related osteoporosis without current pathological fracture: Secondary | ICD-10-CM | POA: Diagnosis not present

## 2017-03-29 DIAGNOSIS — Z0000 Encounter for general adult medical examination without abnormal findings: Secondary | ICD-10-CM | POA: Diagnosis not present

## 2017-03-29 DIAGNOSIS — Z6825 Body mass index (BMI) 25.0-25.9, adult: Secondary | ICD-10-CM | POA: Diagnosis not present

## 2017-03-29 DIAGNOSIS — M1990 Unspecified osteoarthritis, unspecified site: Secondary | ICD-10-CM | POA: Diagnosis not present

## 2017-03-29 DIAGNOSIS — I519 Heart disease, unspecified: Secondary | ICD-10-CM | POA: Diagnosis not present

## 2017-03-29 DIAGNOSIS — J069 Acute upper respiratory infection, unspecified: Secondary | ICD-10-CM | POA: Diagnosis not present

## 2017-03-29 DIAGNOSIS — E1129 Type 2 diabetes mellitus with other diabetic kidney complication: Secondary | ICD-10-CM | POA: Diagnosis not present

## 2017-03-29 DIAGNOSIS — Z1389 Encounter for screening for other disorder: Secondary | ICD-10-CM | POA: Diagnosis not present

## 2017-03-29 DIAGNOSIS — R808 Other proteinuria: Secondary | ICD-10-CM | POA: Diagnosis not present

## 2017-03-29 DIAGNOSIS — H547 Unspecified visual loss: Secondary | ICD-10-CM | POA: Diagnosis not present

## 2017-03-29 DIAGNOSIS — N182 Chronic kidney disease, stage 2 (mild): Secondary | ICD-10-CM | POA: Diagnosis not present

## 2017-03-29 DIAGNOSIS — Z1231 Encounter for screening mammogram for malignant neoplasm of breast: Secondary | ICD-10-CM | POA: Diagnosis not present

## 2017-04-01 DIAGNOSIS — Z1212 Encounter for screening for malignant neoplasm of rectum: Secondary | ICD-10-CM | POA: Diagnosis not present

## 2017-04-18 ENCOUNTER — Encounter (INDEPENDENT_AMBULATORY_CARE_PROVIDER_SITE_OTHER): Payer: Medicare Other | Admitting: Ophthalmology

## 2017-04-18 DIAGNOSIS — E113211 Type 2 diabetes mellitus with mild nonproliferative diabetic retinopathy with macular edema, right eye: Secondary | ICD-10-CM

## 2017-04-18 DIAGNOSIS — H4422 Degenerative myopia, left eye: Secondary | ICD-10-CM

## 2017-04-18 DIAGNOSIS — E11311 Type 2 diabetes mellitus with unspecified diabetic retinopathy with macular edema: Secondary | ICD-10-CM

## 2017-04-18 DIAGNOSIS — H59031 Cystoid macular edema following cataract surgery, right eye: Secondary | ICD-10-CM | POA: Diagnosis not present

## 2017-04-18 DIAGNOSIS — E113292 Type 2 diabetes mellitus with mild nonproliferative diabetic retinopathy without macular edema, left eye: Secondary | ICD-10-CM | POA: Diagnosis not present

## 2017-04-18 DIAGNOSIS — H43813 Vitreous degeneration, bilateral: Secondary | ICD-10-CM

## 2017-04-18 DIAGNOSIS — I10 Essential (primary) hypertension: Secondary | ICD-10-CM | POA: Diagnosis not present

## 2017-04-18 DIAGNOSIS — H35033 Hypertensive retinopathy, bilateral: Secondary | ICD-10-CM

## 2017-04-25 DIAGNOSIS — H401133 Primary open-angle glaucoma, bilateral, severe stage: Secondary | ICD-10-CM | POA: Diagnosis not present

## 2017-04-25 DIAGNOSIS — H35351 Cystoid macular degeneration, right eye: Secondary | ICD-10-CM | POA: Diagnosis not present

## 2017-04-25 DIAGNOSIS — Z961 Presence of intraocular lens: Secondary | ICD-10-CM | POA: Diagnosis not present

## 2017-05-23 DIAGNOSIS — Z961 Presence of intraocular lens: Secondary | ICD-10-CM | POA: Diagnosis not present

## 2017-05-23 DIAGNOSIS — H5711 Ocular pain, right eye: Secondary | ICD-10-CM | POA: Diagnosis not present

## 2017-05-23 DIAGNOSIS — H401134 Primary open-angle glaucoma, bilateral, indeterminate stage: Secondary | ICD-10-CM | POA: Diagnosis not present

## 2017-05-23 DIAGNOSIS — H353122 Nonexudative age-related macular degeneration, left eye, intermediate dry stage: Secondary | ICD-10-CM | POA: Diagnosis not present

## 2017-05-25 DIAGNOSIS — Z23 Encounter for immunization: Secondary | ICD-10-CM | POA: Diagnosis not present

## 2017-05-30 ENCOUNTER — Encounter (INDEPENDENT_AMBULATORY_CARE_PROVIDER_SITE_OTHER): Payer: Medicare Other | Admitting: Ophthalmology

## 2017-06-01 ENCOUNTER — Ambulatory Visit (INDEPENDENT_AMBULATORY_CARE_PROVIDER_SITE_OTHER): Payer: Medicare Other | Admitting: Ophthalmology

## 2017-06-13 ENCOUNTER — Encounter (INDEPENDENT_AMBULATORY_CARE_PROVIDER_SITE_OTHER): Payer: Medicare Other | Admitting: Ophthalmology

## 2017-06-13 ENCOUNTER — Encounter (INDEPENDENT_AMBULATORY_CARE_PROVIDER_SITE_OTHER): Payer: Self-pay

## 2017-07-08 ENCOUNTER — Encounter (INDEPENDENT_AMBULATORY_CARE_PROVIDER_SITE_OTHER): Payer: Medicare Other | Admitting: Ophthalmology

## 2017-07-08 DIAGNOSIS — H35033 Hypertensive retinopathy, bilateral: Secondary | ICD-10-CM | POA: Diagnosis not present

## 2017-07-08 DIAGNOSIS — H43811 Vitreous degeneration, right eye: Secondary | ICD-10-CM

## 2017-07-08 DIAGNOSIS — E11319 Type 2 diabetes mellitus with unspecified diabetic retinopathy without macular edema: Secondary | ICD-10-CM

## 2017-07-08 DIAGNOSIS — E113293 Type 2 diabetes mellitus with mild nonproliferative diabetic retinopathy without macular edema, bilateral: Secondary | ICD-10-CM | POA: Diagnosis not present

## 2017-07-08 DIAGNOSIS — I10 Essential (primary) hypertension: Secondary | ICD-10-CM | POA: Diagnosis not present

## 2017-07-08 DIAGNOSIS — H59031 Cystoid macular edema following cataract surgery, right eye: Secondary | ICD-10-CM | POA: Diagnosis not present

## 2017-07-27 DIAGNOSIS — Z6825 Body mass index (BMI) 25.0-25.9, adult: Secondary | ICD-10-CM | POA: Diagnosis not present

## 2017-07-27 DIAGNOSIS — E119 Type 2 diabetes mellitus without complications: Secondary | ICD-10-CM | POA: Diagnosis not present

## 2017-07-27 DIAGNOSIS — I10 Essential (primary) hypertension: Secondary | ICD-10-CM | POA: Diagnosis not present

## 2017-07-27 DIAGNOSIS — M5489 Other dorsalgia: Secondary | ICD-10-CM | POA: Diagnosis not present

## 2017-07-27 DIAGNOSIS — F4320 Adjustment disorder, unspecified: Secondary | ICD-10-CM | POA: Diagnosis not present

## 2017-08-22 DIAGNOSIS — M545 Low back pain: Secondary | ICD-10-CM | POA: Diagnosis not present

## 2017-08-22 DIAGNOSIS — M542 Cervicalgia: Secondary | ICD-10-CM | POA: Diagnosis not present

## 2017-08-22 DIAGNOSIS — Z79891 Long term (current) use of opiate analgesic: Secondary | ICD-10-CM | POA: Diagnosis not present

## 2017-09-02 ENCOUNTER — Encounter (INDEPENDENT_AMBULATORY_CARE_PROVIDER_SITE_OTHER): Payer: Medicare Other | Admitting: Ophthalmology

## 2017-09-02 DIAGNOSIS — I10 Essential (primary) hypertension: Secondary | ICD-10-CM | POA: Diagnosis not present

## 2017-09-02 DIAGNOSIS — H59031 Cystoid macular edema following cataract surgery, right eye: Secondary | ICD-10-CM | POA: Diagnosis not present

## 2017-09-02 DIAGNOSIS — E113211 Type 2 diabetes mellitus with mild nonproliferative diabetic retinopathy with macular edema, right eye: Secondary | ICD-10-CM | POA: Diagnosis not present

## 2017-09-02 DIAGNOSIS — H43811 Vitreous degeneration, right eye: Secondary | ICD-10-CM | POA: Diagnosis not present

## 2017-09-02 DIAGNOSIS — E11311 Type 2 diabetes mellitus with unspecified diabetic retinopathy with macular edema: Secondary | ICD-10-CM | POA: Diagnosis not present

## 2017-09-02 DIAGNOSIS — E113292 Type 2 diabetes mellitus with mild nonproliferative diabetic retinopathy without macular edema, left eye: Secondary | ICD-10-CM

## 2017-09-02 DIAGNOSIS — H35033 Hypertensive retinopathy, bilateral: Secondary | ICD-10-CM | POA: Diagnosis not present

## 2017-09-05 DIAGNOSIS — Z96611 Presence of right artificial shoulder joint: Secondary | ICD-10-CM | POA: Diagnosis not present

## 2017-09-05 DIAGNOSIS — M79644 Pain in right finger(s): Secondary | ICD-10-CM | POA: Diagnosis not present

## 2017-09-05 DIAGNOSIS — M19012 Primary osteoarthritis, left shoulder: Secondary | ICD-10-CM | POA: Diagnosis not present

## 2017-09-05 DIAGNOSIS — M25512 Pain in left shoulder: Secondary | ICD-10-CM | POA: Diagnosis not present

## 2017-09-21 DIAGNOSIS — H5711 Ocular pain, right eye: Secondary | ICD-10-CM | POA: Diagnosis not present

## 2017-09-21 DIAGNOSIS — Z961 Presence of intraocular lens: Secondary | ICD-10-CM | POA: Diagnosis not present

## 2017-09-21 DIAGNOSIS — H353122 Nonexudative age-related macular degeneration, left eye, intermediate dry stage: Secondary | ICD-10-CM | POA: Diagnosis not present

## 2017-09-21 DIAGNOSIS — H401134 Primary open-angle glaucoma, bilateral, indeterminate stage: Secondary | ICD-10-CM | POA: Diagnosis not present

## 2017-09-27 DIAGNOSIS — M4850XA Collapsed vertebra, not elsewhere classified, site unspecified, initial encounter for fracture: Secondary | ICD-10-CM | POA: Diagnosis not present

## 2017-09-27 DIAGNOSIS — M4692 Unspecified inflammatory spondylopathy, cervical region: Secondary | ICD-10-CM | POA: Diagnosis not present

## 2017-09-28 ENCOUNTER — Other Ambulatory Visit: Payer: Self-pay | Admitting: Internal Medicine

## 2017-09-28 DIAGNOSIS — M4850XA Collapsed vertebra, not elsewhere classified, site unspecified, initial encounter for fracture: Secondary | ICD-10-CM

## 2017-09-30 DIAGNOSIS — M47816 Spondylosis without myelopathy or radiculopathy, lumbar region: Secondary | ICD-10-CM | POA: Diagnosis not present

## 2017-09-30 DIAGNOSIS — M4854XD Collapsed vertebra, not elsewhere classified, thoracic region, subsequent encounter for fracture with routine healing: Secondary | ICD-10-CM | POA: Diagnosis not present

## 2017-09-30 DIAGNOSIS — M4856XD Collapsed vertebra, not elsewhere classified, lumbar region, subsequent encounter for fracture with routine healing: Secondary | ICD-10-CM | POA: Diagnosis not present

## 2017-09-30 DIAGNOSIS — M4854XA Collapsed vertebra, not elsewhere classified, thoracic region, initial encounter for fracture: Secondary | ICD-10-CM | POA: Diagnosis not present

## 2017-09-30 DIAGNOSIS — M47814 Spondylosis without myelopathy or radiculopathy, thoracic region: Secondary | ICD-10-CM | POA: Diagnosis not present

## 2017-10-03 DIAGNOSIS — M25532 Pain in left wrist: Secondary | ICD-10-CM | POA: Diagnosis not present

## 2017-10-03 DIAGNOSIS — M25531 Pain in right wrist: Secondary | ICD-10-CM | POA: Diagnosis not present

## 2017-10-04 DIAGNOSIS — E119 Type 2 diabetes mellitus without complications: Secondary | ICD-10-CM | POA: Diagnosis not present

## 2017-10-04 DIAGNOSIS — R55 Syncope and collapse: Secondary | ICD-10-CM | POA: Diagnosis not present

## 2017-10-04 DIAGNOSIS — Z6825 Body mass index (BMI) 25.0-25.9, adult: Secondary | ICD-10-CM | POA: Diagnosis not present

## 2017-10-04 DIAGNOSIS — I10 Essential (primary) hypertension: Secondary | ICD-10-CM | POA: Diagnosis not present

## 2017-10-04 DIAGNOSIS — I519 Heart disease, unspecified: Secondary | ICD-10-CM | POA: Diagnosis not present

## 2017-10-04 DIAGNOSIS — M4850XA Collapsed vertebra, not elsewhere classified, site unspecified, initial encounter for fracture: Secondary | ICD-10-CM | POA: Diagnosis not present

## 2017-10-13 ENCOUNTER — Other Ambulatory Visit: Payer: Self-pay

## 2017-10-13 ENCOUNTER — Inpatient Hospital Stay (HOSPITAL_COMMUNITY)
Admission: EM | Admit: 2017-10-13 | Discharge: 2017-10-20 | DRG: 392 | Disposition: A | Discharge status: Skilled Nursing Facility | Payer: Medicare Other | Attending: Internal Medicine | Admitting: Internal Medicine

## 2017-10-13 DIAGNOSIS — K5730 Diverticulosis of large intestine without perforation or abscess without bleeding: Principal | ICD-10-CM | POA: Diagnosis present

## 2017-10-13 DIAGNOSIS — Z823 Family history of stroke: Secondary | ICD-10-CM

## 2017-10-13 DIAGNOSIS — E860 Dehydration: Secondary | ICD-10-CM | POA: Diagnosis present

## 2017-10-13 DIAGNOSIS — R079 Chest pain, unspecified: Secondary | ICD-10-CM | POA: Diagnosis not present

## 2017-10-13 DIAGNOSIS — R1011 Right upper quadrant pain: Secondary | ICD-10-CM | POA: Diagnosis not present

## 2017-10-13 DIAGNOSIS — K589 Irritable bowel syndrome without diarrhea: Secondary | ICD-10-CM

## 2017-10-13 DIAGNOSIS — Z885 Allergy status to narcotic agent status: Secondary | ICD-10-CM

## 2017-10-13 DIAGNOSIS — R109 Unspecified abdominal pain: Secondary | ICD-10-CM | POA: Diagnosis present

## 2017-10-13 DIAGNOSIS — I10 Essential (primary) hypertension: Secondary | ICD-10-CM | POA: Diagnosis present

## 2017-10-13 DIAGNOSIS — M545 Low back pain: Secondary | ICD-10-CM | POA: Diagnosis not present

## 2017-10-13 DIAGNOSIS — Z880 Allergy status to penicillin: Secondary | ICD-10-CM

## 2017-10-13 DIAGNOSIS — M810 Age-related osteoporosis without current pathological fracture: Secondary | ICD-10-CM | POA: Diagnosis present

## 2017-10-13 DIAGNOSIS — L89151 Pressure ulcer of sacral region, stage 1: Secondary | ICD-10-CM | POA: Diagnosis present

## 2017-10-13 DIAGNOSIS — M1990 Unspecified osteoarthritis, unspecified site: Secondary | ICD-10-CM | POA: Diagnosis present

## 2017-10-13 DIAGNOSIS — E1140 Type 2 diabetes mellitus with diabetic neuropathy, unspecified: Secondary | ICD-10-CM | POA: Diagnosis present

## 2017-10-13 DIAGNOSIS — Z79891 Long term (current) use of opiate analgesic: Secondary | ICD-10-CM

## 2017-10-13 DIAGNOSIS — Z8041 Family history of malignant neoplasm of ovary: Secondary | ICD-10-CM

## 2017-10-13 DIAGNOSIS — R197 Diarrhea, unspecified: Secondary | ICD-10-CM | POA: Diagnosis not present

## 2017-10-13 DIAGNOSIS — D649 Anemia, unspecified: Secondary | ICD-10-CM | POA: Diagnosis present

## 2017-10-13 DIAGNOSIS — E871 Hypo-osmolality and hyponatremia: Secondary | ICD-10-CM | POA: Diagnosis present

## 2017-10-13 DIAGNOSIS — Z85828 Personal history of other malignant neoplasm of skin: Secondary | ICD-10-CM

## 2017-10-13 DIAGNOSIS — Z888 Allergy status to other drugs, medicaments and biological substances status: Secondary | ICD-10-CM

## 2017-10-13 DIAGNOSIS — R531 Weakness: Secondary | ICD-10-CM

## 2017-10-13 DIAGNOSIS — E119 Type 2 diabetes mellitus without complications: Secondary | ICD-10-CM

## 2017-10-13 DIAGNOSIS — Z8249 Family history of ischemic heart disease and other diseases of the circulatory system: Secondary | ICD-10-CM

## 2017-10-13 DIAGNOSIS — T502X5A Adverse effect of carbonic-anhydrase inhibitors, benzothiadiazides and other diuretics, initial encounter: Secondary | ICD-10-CM | POA: Diagnosis present

## 2017-10-13 DIAGNOSIS — Z96611 Presence of right artificial shoulder joint: Secondary | ICD-10-CM | POA: Diagnosis present

## 2017-10-13 DIAGNOSIS — L8990 Pressure ulcer of unspecified site, unspecified stage: Secondary | ICD-10-CM

## 2017-10-13 DIAGNOSIS — Z7984 Long term (current) use of oral hypoglycemic drugs: Secondary | ICD-10-CM

## 2017-10-13 DIAGNOSIS — Y929 Unspecified place or not applicable: Secondary | ICD-10-CM

## 2017-10-13 DIAGNOSIS — F0394 Unspecified dementia, unspecified severity, with anxiety: Secondary | ICD-10-CM

## 2017-10-13 DIAGNOSIS — R059 Cough, unspecified: Secondary | ICD-10-CM

## 2017-10-13 DIAGNOSIS — G8929 Other chronic pain: Secondary | ICD-10-CM | POA: Diagnosis not present

## 2017-10-13 DIAGNOSIS — K580 Irritable bowel syndrome with diarrhea: Secondary | ICD-10-CM | POA: Diagnosis present

## 2017-10-13 DIAGNOSIS — F419 Anxiety disorder, unspecified: Secondary | ICD-10-CM | POA: Diagnosis present

## 2017-10-13 DIAGNOSIS — Z96642 Presence of left artificial hip joint: Secondary | ICD-10-CM | POA: Diagnosis present

## 2017-10-13 DIAGNOSIS — R05 Cough: Secondary | ICD-10-CM | POA: Diagnosis not present

## 2017-10-13 DIAGNOSIS — Z9841 Cataract extraction status, right eye: Secondary | ICD-10-CM

## 2017-10-13 DIAGNOSIS — E785 Hyperlipidemia, unspecified: Secondary | ICD-10-CM | POA: Diagnosis present

## 2017-10-13 DIAGNOSIS — Z79899 Other long term (current) drug therapy: Secondary | ICD-10-CM

## 2017-10-13 DIAGNOSIS — H409 Unspecified glaucoma: Secondary | ICD-10-CM | POA: Diagnosis present

## 2017-10-13 DIAGNOSIS — Z792 Long term (current) use of antibiotics: Secondary | ICD-10-CM

## 2017-10-13 DIAGNOSIS — Z7982 Long term (current) use of aspirin: Secondary | ICD-10-CM

## 2017-10-13 DIAGNOSIS — E876 Hypokalemia: Secondary | ICD-10-CM | POA: Diagnosis not present

## 2017-10-13 DIAGNOSIS — Z90710 Acquired absence of both cervix and uterus: Secondary | ICD-10-CM

## 2017-10-13 DIAGNOSIS — Z9049 Acquired absence of other specified parts of digestive tract: Secondary | ICD-10-CM

## 2017-10-13 DIAGNOSIS — Z9842 Cataract extraction status, left eye: Secondary | ICD-10-CM

## 2017-10-13 HISTORY — DX: Noninfective gastroenteritis and colitis, unspecified: K52.9

## 2017-10-13 MED ORDER — SODIUM CHLORIDE 0.9 % IV SOLN
Freq: Once | INTRAVENOUS | Status: AC
  Administered 2017-10-14: via INTRAVENOUS

## 2017-10-13 MED ORDER — FENTANYL CITRATE (PF) 100 MCG/2ML IJ SOLN
50.0000 ug | Freq: Once | INTRAMUSCULAR | Status: AC
  Administered 2017-10-14: 50 ug via INTRAVENOUS
  Filled 2017-10-13: qty 2

## 2017-10-13 NOTE — ED Triage Notes (Signed)
Per EMS; patient c/o CP. Patient denies CP and c/o only abdominal pain (epigastric area). Alert and oriented x4 and lives at home alone.

## 2017-10-13 NOTE — ED Provider Notes (Addendum)
[facility] EMERGENCY DEPARTMENT Provider Note   CSN: [id number] Arrival date & time: 10/13/17  2311     History   Chief Complaint Chief Complaint  Patient presents with  . Abdominal Pain    HPI C[place] is a 84 y.o. female.  HPI  84 year old female with history of gallstones, diabetes comes in with chief complaint of abdominal pain.  Contrary to what is listed as her chief complaint, patient does not have any chest pain. Patient states that she started having right-sided abdominal pain and epigastric abdominal pain when she woke up this morning.  She took some Pepto-Bismol, and it initially helped her, but over time the pain returned and has gotten worse.  Patient describes her pain as burning type pain that is nonradiating.  Patient does not have any specific aggravating or relieving factors.  No history of similar pain in the past, and patient denies any associated nausea, but does admit to diarrhea-like bowel movements and reduced appetite.   Past Medical History:  Diagnosis Date  . Anxiety   . Arthritis   . Cancer ([place])    hx of skin cancer removal on hand   . Diabetes mellitus without complication ([place])   . Gallstones   . Hyperlipidemia   . Hypertension   . IBS (irritable bowel syndrome)   . Osteoporosis     Patient Active Problem List   Diagnosis Date Noted  . 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 OA (osteoarthritis) of hip 02/20/2014  . Essential hypertension 12/03/2013  . Hyperlipidemia 12/03/2013  . Diabetes ([place]) 12/03/2013    Past Surgical History:  Procedure Laterality Date  . APPENDECTOMY    . BACK SURGERY     x2  . cataract surgery      bilateral   . CHOLECYSTECTOMY  1999  . EYE SURGERY     for glaucoma   . left eye peeling surgery     . left femur bone surgery      1978  . REVERSE SHOULDER ARTHROPLASTY Right 10/10/2014   Procedure: RIGHT REVERSE SHOULDER ARTHROPLASTY;  Surgeon: Justice Britain, MD;   Location: [facility];  Service: Orthopedics;  Laterality: Right;  . ROTATOR CUFF REPAIR    . TOTAL HIP ARTHROPLASTY Left 02/20/2014   Procedure: LEFT TOTAL HIP ARTHROPLASTY ANTERIOR APPROACH;  Surgeon: Gearlean Alf, MD;  Location[facility];  Service: Orthopedics;  Laterality: Left;  . WRIST SURGERY     left      OB History   None      Home Medications    Prior to Admission medications   Medication Sig Start Date End Date Taking? Authorizing Provider  ALPRAZolam (XANAX) 0.25 MG tablet Take 0.25 mg by mouth at bedtime.     [provider]  amLODipine (NORVASC) 5 MG tablet Take 5 mg by mouth daily.  12/30/16   [provider]  aspirin EC 81 MG tablet Take 81 mg by mouth daily.    [provider]  azithromycin (ZITHROMAX) 250 MG tablet Take 1 tablet (250 mg total) by mouth daily. Take 1 every day until finished. 01/15/17   Mesner, Corene Cornea, MD  Cinnamon 500 MG capsule Take 500 mg by mouth daily.    [provider]  docusate sodium 100 MG CAPS Take 100 mg by mouth 2 (two) times daily. Patient taking differently: Take 100 mg by mouth daily as needed (constipation).  02/25/14   Perkins, Alexzandrew L, PA-C  ezetimibe-simvastatin (VYTORIN) 10-40  MG per tablet Take 0.5 tablets by mouth at bedtime.     [provider]  furosemide (LASIX) 20 MG tablet Take 20 mg by mouth daily.     [provider]  gabapentin (NEURONTIN) 300 MG capsule Take 300 mg by mouth 3 (three) times daily as needed (pain).     [provider]  losartan (COZAAR) 50 MG tablet Take 50 mg by mouth daily with supper.     [provider]  metFORMIN (GLUCOPHAGE) 500 MG tablet Take 500 mg by mouth daily with breakfast.    [provider]  oxyCODONE-acetaminophen (PERCOCET) 10-325 MG tablet Take 1 tablet by mouth every 6 (six) hours as needed for pain.  12/22/16   [provider]  valsartan (DIOVAN) 320 MG tablet Take 320 mg by mouth daily.  01/10/17    [provider]    Family History Family History  Problem Relation Age of Onset  . Stroke Mother   . Hypertension Mother   . Ovarian cancer Sister     Social History Social History   Tobacco Use  . Smoking status: Never Smoker  . Smokeless tobacco: Never Used  Substance Use Topics  . Alcohol use: Yes    Comment: rare glass of wine   . Drug use: No     Allergies   Robaxin [methocarbamol]; Ancef [cefazolin]; Morphine and related; Penicillins; and Flurbiprofen   Review of Systems Review of Systems  Constitutional: Positive for activity change.  Gastrointestinal: Positive for abdominal pain and diarrhea.  Genitourinary: Negative for dysuria.  Allergic/Immunologic: Negative for immunocompromised state.  Hematological: Does not bruise/bleed easily.    Physical Exam Updated Vital Signs BP (!) 132/52   Pulse (!) 57   Temp 98.9 F (37.2 C) (Oral)   Resp (!) 22   SpO2 97%   Physical Exam  Constitutional: She is oriented to person, place, and time. She appears well-developed.  HENT:  Head: Normocephalic and atraumatic.  Eyes: EOM are normal.  Neck: Normal range of motion. Neck supple.  Cardiovascular: Normal rate.  Pulmonary/Chest: Effort normal.  Abdominal: Bowel sounds are normal. There is tenderness in the right upper quadrant and right lower quadrant. There is no rigidity, no rebound and no guarding.  Neurological: She is alert and oriented to person, place, and time.  Skin: Skin is warm and dry.  Nursing note and vitals reviewed.    ED Treatments / Results  Labs (all labs ordered are listed, but only abnormal results are displayed) Labs Reviewed  CBC WITH DIFFERENTIAL/PLATELET - Abnormal; Notable for the following components:      Result Value   WBC 12.0 (*)    RBC 3.63 (*)    Hemoglobin 10.9 (*)    HCT 33.9 (*)    Platelets 447 (*)    Neutro Abs 9.4 (*)    All other components within normal limits  COMPREHENSIVE METABOLIC PANEL - Abnormal;  Notable for the following components:   Sodium 130 (*)    Chloride 98 (*)    CO2 19 (*)    Glucose, Bld 119 (*)    Total Protein 6.2 (*)    All other components within normal limits  LIPASE, BLOOD  TROPONIN I  CBC  I-STAT CG4 LACTIC ACID, ED  POC OCCULT BLOOD, ED    EKG EKG Interpretation  Date/Time:  Thursday October 13 2017 23:18:47 EDT Ventricular Rate:  77 PR Interval:  172 QRS Duration: 68 QT Interval:  366 QTC Calculation: 414 R Axis:  Text Interpretation:  Sinus rhythm with sinus arrhythmia with occasional Premature ventricular complexes Cannot rule out Anterior infarct , age undetermined Abnormal ECG No acute changes Nonspecific ST and T wave abnormality Confirmed by Varney Biles [phone]) on 10/13/2017 11:26:34 PM   Radiology Ct Abdomen Pelvis W Contrast  Result Date: 10/14/2017 CLINICAL DATA:  84 year old female with acute generalized abdominal pain. EXAM: CT ABDOMEN AND PELVIS WITH CONTRAST TECHNIQUE: Multidetector CT imaging of the abdomen and pelvis was performed using the standard protocol following bolus administration of intravenous contrast. CONTRAST:  188mL ISOVUE-300 IOPAMIDOL (ISOVUE-300) INJECTION 61% COMPARISON:  None. FINDINGS: Lower chest: The visualized lung bases are clear. No intra-abdominal free air or free fluid. Hepatobiliary: Cholecystectomy.  The liver is unremarkable. Pancreas: Unremarkable. No pancreatic ductal dilatation or surrounding inflammatory changes. Spleen: Normal in size without focal abnormality. Adrenals/Urinary Tract: The adrenal glands are unremarkable. There is no hydronephrosis on either side. The visualized ureters and urinary bladder appear unremarkable. Stomach/Bowel: There is extensive sigmoid diverticulosis with muscular hypertrophy. Mild thickened appearance of the descending colon, likely related to underdistention. Colitis is less likely. Clinical correlation is recommended. There is no bowel obstruction. Appendectomy.  Vascular/Lymphatic: Mild aortoiliac atherosclerotic disease. The origins of the celiac axis, SMA, IMA remain patent. No portal venous gas. There is no adenopathy. Reproductive: The uterus is not visualized, likely atrophic or surgically absent. No pelvic mass. Other: None Musculoskeletal: Total left hip arthroplasty. There is osteopenia with scoliosis and degenerative changes of the spine. Multilevel old-appearing compression deformities. No acute osseous pathology. IMPRESSION: 1. Underdistention of the descending colon versus mild colitis. Clinical correlation is recommended. 2. Sigmoid diverticulosis.  No bowel obstruction. 3.  Aortic Atherosclerosis (ICD10-I70.0). Electronically Signed   By: Anner Crete M.D.   On: 10/14/2017 03:12    Procedures Procedures (including critical care time)  Medications Ordered in ED Medications  fentaNYL (SUBLIMAZE) injection 50 mcg (50 mcg Intravenous Given 10/14/17 0357)  fentaNYL (SUBLIMAZE) injection 50 mcg (50 mcg Intravenous Given 10/14/17 0023)  0.9 %  sodium chloride infusion ( Intravenous New Bag/Given 10/14/17 0023)  iopamidol (ISOVUE-300) 61 % injection (100 mLs  Contrast Given 10/14/17 0240)  acetaminophen (TYLENOL) tablet 650 mg (650 mg Oral Given 10/14/17 0400)  gabapentin (NEURONTIN) capsule 300 mg (300 mg Oral Given 10/14/17 0630)     Initial Impression / Assessment and Plan / ED Course  I have reviewed the triage vital signs and the nursing notes.  Pertinent labs & imaging results that were available during my care of the patient were reviewed by me and considered in my medical decision making (see chart for details).  Clinical Course as of Oct 15 703  Fri Oct 14, 2017  0702 Results from the ER discussed with the patient and family.  Patient still is having pain.  She started reporting that she is having dark tarry stools.  Digital rectal exam reveals dark stools that are guaiac positive, but no melena.  BUN is not elevated, hemoglobin is  about half a gram less than baseline.  Therefore concerns for upper GI bleed are slightly low.  Patient has known diverticulosis but she is having abdominal pain, therefore diverticular bleed is less likely.  Patient's granddaughter at bedside.  She tried to ambulate patient, but noted that grandmother was too weak.  Patient is 32 and lives by herself, and the granddaughter is not comfortable taking her home like this.  Given that patient is having persistent abdominal pain, back pain, questionable bloody stools, generalized weakness -we will try  to admit patient as observation status.  Have also consulted case management to see if they can assist further in case patient is discharged.   [AN]    Clinical Course User Index [AN] Varney Biles, MD    84 year old female comes in with chief complaint of abdominal pain. Patient's tenderness is located in the epigastric region, right upper and lower quadrants.  Pain is worse in the right lower quadrant, and she has history of abdominal surgeries.  Differential diagnosis includes SBO, appendicitis, perforation.  It does not seem like patient is having any chest pain.  Given that there is discomfort in the epigastric region, we will get troponin and EKG.  Final Clinical Impressions(s) / ED Diagnoses   Final diagnoses:  RUQ abdominal pain  Generalized weakness    ED Discharge Orders    None       Varney Biles, MD 10/14/17 7096    Varney Biles, MD 10/14/17 4383    Varney Biles, MD 10/14/17 [phone]

## 2017-10-14 ENCOUNTER — Emergency Department (HOSPITAL_COMMUNITY): Payer: Medicare Other

## 2017-10-14 DIAGNOSIS — Z885 Allergy status to narcotic agent status: Secondary | ICD-10-CM | POA: Diagnosis not present

## 2017-10-14 DIAGNOSIS — Z85828 Personal history of other malignant neoplasm of skin: Secondary | ICD-10-CM | POA: Diagnosis not present

## 2017-10-14 DIAGNOSIS — I10 Essential (primary) hypertension: Secondary | ICD-10-CM

## 2017-10-14 DIAGNOSIS — M545 Low back pain: Secondary | ICD-10-CM | POA: Diagnosis present

## 2017-10-14 DIAGNOSIS — E782 Mixed hyperlipidemia: Secondary | ICD-10-CM | POA: Diagnosis not present

## 2017-10-14 DIAGNOSIS — K529 Noninfective gastroenteritis and colitis, unspecified: Secondary | ICD-10-CM | POA: Diagnosis not present

## 2017-10-14 DIAGNOSIS — G8929 Other chronic pain: Secondary | ICD-10-CM | POA: Diagnosis present

## 2017-10-14 DIAGNOSIS — R109 Unspecified abdominal pain: Secondary | ICD-10-CM

## 2017-10-14 DIAGNOSIS — Z96642 Presence of left artificial hip joint: Secondary | ICD-10-CM | POA: Diagnosis present

## 2017-10-14 DIAGNOSIS — E871 Hypo-osmolality and hyponatremia: Secondary | ICD-10-CM | POA: Diagnosis present

## 2017-10-14 DIAGNOSIS — K5190 Ulcerative colitis, unspecified, without complications: Secondary | ICD-10-CM | POA: Diagnosis not present

## 2017-10-14 DIAGNOSIS — M6281 Muscle weakness (generalized): Secondary | ICD-10-CM | POA: Diagnosis not present

## 2017-10-14 DIAGNOSIS — H409 Unspecified glaucoma: Secondary | ICD-10-CM | POA: Diagnosis present

## 2017-10-14 DIAGNOSIS — Z9049 Acquired absence of other specified parts of digestive tract: Secondary | ICD-10-CM | POA: Diagnosis not present

## 2017-10-14 DIAGNOSIS — R278 Other lack of coordination: Secondary | ICD-10-CM | POA: Diagnosis not present

## 2017-10-14 DIAGNOSIS — K588 Other irritable bowel syndrome: Secondary | ICD-10-CM | POA: Diagnosis not present

## 2017-10-14 DIAGNOSIS — Z7982 Long term (current) use of aspirin: Secondary | ICD-10-CM | POA: Diagnosis not present

## 2017-10-14 DIAGNOSIS — D649 Anemia, unspecified: Secondary | ICD-10-CM | POA: Diagnosis not present

## 2017-10-14 DIAGNOSIS — F0394 Unspecified dementia, unspecified severity, with anxiety: Secondary | ICD-10-CM

## 2017-10-14 DIAGNOSIS — Z9841 Cataract extraction status, right eye: Secondary | ICD-10-CM | POA: Diagnosis not present

## 2017-10-14 DIAGNOSIS — L89151 Pressure ulcer of sacral region, stage 1: Secondary | ICD-10-CM | POA: Diagnosis present

## 2017-10-14 DIAGNOSIS — F419 Anxiety disorder, unspecified: Secondary | ICD-10-CM

## 2017-10-14 DIAGNOSIS — E785 Hyperlipidemia, unspecified: Secondary | ICD-10-CM | POA: Diagnosis present

## 2017-10-14 DIAGNOSIS — R1032 Left lower quadrant pain: Secondary | ICD-10-CM | POA: Diagnosis not present

## 2017-10-14 DIAGNOSIS — Z96611 Presence of right artificial shoulder joint: Secondary | ICD-10-CM | POA: Diagnosis present

## 2017-10-14 DIAGNOSIS — Z9842 Cataract extraction status, left eye: Secondary | ICD-10-CM | POA: Diagnosis not present

## 2017-10-14 DIAGNOSIS — Z79891 Long term (current) use of opiate analgesic: Secondary | ICD-10-CM | POA: Diagnosis not present

## 2017-10-14 DIAGNOSIS — E0800 Diabetes mellitus due to underlying condition with hyperosmolarity without nonketotic hyperglycemic-hyperosmolar coma (NKHHC): Secondary | ICD-10-CM | POA: Diagnosis not present

## 2017-10-14 DIAGNOSIS — K5730 Diverticulosis of large intestine without perforation or abscess without bleeding: Secondary | ICD-10-CM | POA: Diagnosis present

## 2017-10-14 DIAGNOSIS — Z880 Allergy status to penicillin: Secondary | ICD-10-CM | POA: Diagnosis not present

## 2017-10-14 DIAGNOSIS — R1011 Right upper quadrant pain: Secondary | ICD-10-CM | POA: Diagnosis not present

## 2017-10-14 DIAGNOSIS — Y929 Unspecified place or not applicable: Secondary | ICD-10-CM | POA: Diagnosis not present

## 2017-10-14 DIAGNOSIS — M1990 Unspecified osteoarthritis, unspecified site: Secondary | ICD-10-CM | POA: Diagnosis present

## 2017-10-14 DIAGNOSIS — M810 Age-related osteoporosis without current pathological fracture: Secondary | ICD-10-CM | POA: Diagnosis present

## 2017-10-14 DIAGNOSIS — R2681 Unsteadiness on feet: Secondary | ICD-10-CM | POA: Diagnosis not present

## 2017-10-14 DIAGNOSIS — K581 Irritable bowel syndrome with constipation: Secondary | ICD-10-CM | POA: Diagnosis not present

## 2017-10-14 DIAGNOSIS — K580 Irritable bowel syndrome with diarrhea: Secondary | ICD-10-CM | POA: Diagnosis not present

## 2017-10-14 DIAGNOSIS — R05 Cough: Secondary | ICD-10-CM | POA: Diagnosis not present

## 2017-10-14 DIAGNOSIS — Z9181 History of falling: Secondary | ICD-10-CM | POA: Diagnosis not present

## 2017-10-14 DIAGNOSIS — K589 Irritable bowel syndrome without diarrhea: Secondary | ICD-10-CM

## 2017-10-14 LAB — CBC WITH DIFFERENTIAL/PLATELET
BASOS ABS: 0 10*3/uL (ref 0.0–0.1)
BASOS PCT: 0 %
Eosinophils Absolute: 0 10*3/uL (ref 0.0–0.7)
Eosinophils Relative: 0 %
HEMATOCRIT: 33.9 % — AB (ref 36.0–46.0)
HEMOGLOBIN: 10.9 g/dL — AB (ref 12.0–15.0)
Lymphocytes Relative: 14 %
Lymphs Abs: 1.6 10*3/uL (ref 0.7–4.0)
MCH: 30 pg (ref 26.0–34.0)
MCHC: 32.2 g/dL (ref 30.0–36.0)
MCV: 93.4 fL (ref 78.0–100.0)
MONOS PCT: 8 %
Monocytes Absolute: 1 10*3/uL (ref 0.1–1.0)
NEUTROS ABS: 9.4 10*3/uL — AB (ref 1.7–7.7)
NEUTROS PCT: 78 %
Platelets: 447 10*3/uL — ABNORMAL HIGH (ref 150–400)
RBC: 3.63 MIL/uL — ABNORMAL LOW (ref 3.87–5.11)
RDW: 12.9 % (ref 11.5–15.5)
WBC: 12 10*3/uL — ABNORMAL HIGH (ref 4.0–10.5)

## 2017-10-14 LAB — POC OCCULT BLOOD, ED: Fecal Occult Bld: NEGATIVE

## 2017-10-14 LAB — CBC
HCT: 33.5 % — ABNORMAL LOW (ref 36.0–46.0)
HEMATOCRIT: 30.1 % — AB (ref 36.0–46.0)
HEMOGLOBIN: 9.9 g/dL — AB (ref 12.0–15.0)
Hemoglobin: 10.9 g/dL — ABNORMAL LOW (ref 12.0–15.0)
MCH: 30.8 pg (ref 26.0–34.0)
MCH: 30.4 pg (ref 26.0–34.0)
MCHC: 32.9 g/dL (ref 30.0–36.0)
MCHC: 32.5 g/dL (ref 30.0–36.0)
MCV: 93.8 fL (ref 78.0–100.0)
MCV: 93.3 fL (ref 78.0–100.0)
PLATELETS: 440 10*3/uL — AB (ref 150–400)
Platelets: 397 10*3/uL (ref 150–400)
RBC: 3.21 MIL/uL — ABNORMAL LOW (ref 3.87–5.11)
RBC: 3.59 MIL/uL — AB (ref 3.87–5.11)
RDW: 13.2 % (ref 11.5–15.5)
RDW: 13 % (ref 11.5–15.5)
WBC: 8.1 10*3/uL (ref 4.0–10.5)
WBC: 11.6 10*3/uL — ABNORMAL HIGH (ref 4.0–10.5)

## 2017-10-14 LAB — URINALYSIS, ROUTINE W REFLEX MICROSCOPIC
Bilirubin Urine: NEGATIVE
GLUCOSE, UA: NEGATIVE mg/dL
Ketones, ur: 20 mg/dL — AB
Leukocytes, UA: NEGATIVE
Nitrite: NEGATIVE
PROTEIN: NEGATIVE mg/dL
SPECIFIC GRAVITY, URINE: 1.006 (ref 1.005–1.030)
pH: 7 (ref 5.0–8.0)

## 2017-10-14 LAB — TROPONIN I
Troponin I: <0.03 ng/mL (ref <0.03)
Troponin I: <0.03 ng/mL (ref <0.03)

## 2017-10-14 LAB — BASIC METABOLIC PANEL
Anion gap: 9 (ref 5–15)
BUN: 5 mg/dL — ABNORMAL LOW (ref 6–20)
CALCIUM: 8.5 mg/dL — AB (ref 8.9–10.3)
CO2: 20 mmol/L — ABNORMAL LOW (ref 22–32)
CREATININE: 0.59 mg/dL (ref 0.44–1.00)
Chloride: 102 mmol/L (ref 101–111)
Glucose, Bld: 89 mg/dL (ref 65–99)
Potassium: 3.5 mmol/L (ref 3.5–5.1)
SODIUM: 131 mmol/L — AB (ref 135–145)

## 2017-10-14 LAB — COMPREHENSIVE METABOLIC PANEL
ALBUMIN: 3.5 g/dL (ref 3.5–5.0)
ALK PHOS: 58 U/L (ref 38–126)
ALT: 23 U/L (ref 14–54)
AST: 32 U/L (ref 15–41)
Anion gap: 13 (ref 5–15)
BUN: 8 mg/dL (ref 6–20)
CALCIUM: 9.3 mg/dL (ref 8.9–10.3)
CO2: 19 mmol/L — AB (ref 22–32)
CREATININE: 0.64 mg/dL (ref 0.44–1.00)
Chloride: 98 mmol/L — ABNORMAL LOW (ref 101–111)
GFR calc Af Amer: >60 mL/min (ref >60)
GFR calc non Af Amer: >60 mL/min (ref >60)
GLUCOSE: 119 mg/dL — AB (ref 65–99)
Potassium: 3.7 mmol/L (ref 3.5–5.1)
SODIUM: 130 mmol/L — AB (ref 135–145)
Total Bilirubin: 0.6 mg/dL (ref 0.3–1.2)
Total Protein: 6.2 g/dL — ABNORMAL LOW (ref 6.5–8.1)

## 2017-10-14 LAB — I-STAT CG4 LACTIC ACID, ED: Lactic Acid, Venous: 0.82 mmol/L (ref 0.5–1.9)

## 2017-10-14 LAB — CBG MONITORING, ED
GLUCOSE-CAPILLARY: 95 mg/dL (ref 65–99)
GLUCOSE-CAPILLARY: 85 mg/dL (ref 65–99)

## 2017-10-14 LAB — GLUCOSE, CAPILLARY
Glucose-Capillary: 67 mg/dL (ref 65–99)
Glucose-Capillary: 72 mg/dL (ref 65–99)
Glucose-Capillary: 82 mg/dL (ref 65–99)

## 2017-10-14 LAB — HEMOGLOBIN A1C
HEMOGLOBIN A1C: 6.7 % — AB (ref 4.8–5.6)
Mean Plasma Glucose: 145.59 mg/dL

## 2017-10-14 LAB — LIPASE, BLOOD: Lipase: 38 U/L (ref 11–51)

## 2017-10-14 MED ORDER — EZETIMIBE-SIMVASTATIN 10-40 MG PO TABS
0.5000 | ORAL_TABLET | Freq: Every day | ORAL | Status: DC
  Administered 2017-10-14 – 2017-10-19 (×6): 0.5 via ORAL
  Filled 2017-10-14 (×7): qty 0.5

## 2017-10-14 MED ORDER — ACETAMINOPHEN 325 MG PO TABS
650.0000 mg | ORAL_TABLET | Freq: Once | ORAL | Status: AC
  Administered 2017-10-14: 650 mg via ORAL
  Filled 2017-10-14: qty 2

## 2017-10-14 MED ORDER — AMLODIPINE BESYLATE 5 MG PO TABS
5.0000 mg | ORAL_TABLET | Freq: Every day | ORAL | Status: DC
  Administered 2017-10-14 – 2017-10-20 (×7): 5 mg via ORAL
  Filled 2017-10-14 (×7): qty 1

## 2017-10-14 MED ORDER — INSULIN ASPART 100 UNIT/ML ~~LOC~~ SOLN
0.0000 [IU] | Freq: Three times a day (TID) | SUBCUTANEOUS | Status: DC
  Administered 2017-10-15: 2 [IU] via SUBCUTANEOUS
  Administered 2017-10-16 – 2017-10-19 (×6): 1 [IU] via SUBCUTANEOUS
  Administered 2017-10-20: 3 [IU] via SUBCUTANEOUS

## 2017-10-14 MED ORDER — BISACODYL 10 MG RE SUPP: 10.0000 mg | Freq: Every day | RECTAL | Status: DC | PRN

## 2017-10-14 MED ORDER — OXYCODONE-ACETAMINOPHEN 5-325 MG PO TABS
1.0000 | ORAL_TABLET | Freq: Four times a day (QID) | ORAL | Status: DC | PRN
  Administered 2017-10-14 – 2017-10-20 (×16): 1 via ORAL
  Filled 2017-10-14 (×17): qty 1

## 2017-10-14 MED ORDER — FENTANYL CITRATE (PF) 100 MCG/2ML IJ SOLN
50.0000 ug | INTRAMUSCULAR | Status: DC | PRN
  Administered 2017-10-14: 50 ug via INTRAVENOUS
  Filled 2017-10-14: qty 2

## 2017-10-14 MED ORDER — SODIUM CHLORIDE 0.9 % IV SOLN
INTRAVENOUS | Status: DC
  Administered 2017-10-14 (×2): via INTRAVENOUS

## 2017-10-14 MED ORDER — DEXTROSE-NACL 5-0.9 % IV SOLN
INTRAVENOUS | Status: DC
  Administered 2017-10-14: 100 mL/h via INTRAVENOUS
  Administered 2017-10-15 (×2): via INTRAVENOUS

## 2017-10-14 MED ORDER — ALPRAZOLAM 0.25 MG PO TABS
0.2500 mg | ORAL_TABLET | Freq: Every day | ORAL | Status: DC
  Administered 2017-10-14 – 2017-10-19 (×6): 0.25 mg via ORAL
  Filled 2017-10-14 (×6): qty 1

## 2017-10-14 MED ORDER — OXYCODONE-ACETAMINOPHEN 10-325 MG PO TABS: 1.0000 | ORAL_TABLET | Freq: Four times a day (QID) | ORAL | Status: DC | PRN

## 2017-10-14 MED ORDER — ACETAMINOPHEN 650 MG RE SUPP: 650.0000 mg | Freq: Four times a day (QID) | RECTAL | Status: DC | PRN

## 2017-10-14 MED ORDER — SENNOSIDES-DOCUSATE SODIUM 8.6-50 MG PO TABS: 1.0000 | ORAL_TABLET | Freq: Every evening | ORAL | Status: DC | PRN

## 2017-10-14 MED ORDER — TRAMADOL HCL 50 MG PO TABS
50.0000 mg | ORAL_TABLET | Freq: Four times a day (QID) | ORAL | Status: DC | PRN
  Administered 2017-10-15 – 2017-10-20 (×13): 50 mg via ORAL
  Filled 2017-10-14 (×13): qty 1

## 2017-10-14 MED ORDER — ONDANSETRON HCL 4 MG PO TABS: 4.0000 mg | ORAL_TABLET | Freq: Four times a day (QID) | ORAL | Status: DC | PRN

## 2017-10-14 MED ORDER — GABAPENTIN 300 MG PO CAPS
300.0000 mg | ORAL_CAPSULE | Freq: Three times a day (TID) | ORAL | Status: DC | PRN
Start: 2017-10-14 — End: 2017-10-20
  Administered 2017-10-14 – 2017-10-20 (×15): 300 mg via ORAL
  Filled 2017-10-14 (×15): qty 1

## 2017-10-14 MED ORDER — ACETAMINOPHEN 325 MG PO TABS
650.0000 mg | ORAL_TABLET | Freq: Four times a day (QID) | ORAL | Status: DC | PRN
  Administered 2017-10-15 – 2017-10-20 (×5): 650 mg via ORAL
  Filled 2017-10-14 (×5): qty 2

## 2017-10-14 MED ORDER — ONDANSETRON HCL 4 MG/2ML IJ SOLN: 4.0000 mg | Freq: Four times a day (QID) | INTRAMUSCULAR | Status: DC | PRN

## 2017-10-14 MED ORDER — SODIUM CHLORIDE 0.9 % IV SOLN
1.5000 g | Freq: Four times a day (QID) | INTRAVENOUS | Status: DC
  Administered 2017-10-14 – 2017-10-20 (×26): 1.5 g via INTRAVENOUS
  Filled 2017-10-14 (×29): qty 1.5

## 2017-10-14 MED ORDER — IOPAMIDOL (ISOVUE-300) INJECTION 61%
INTRAVENOUS | Status: AC
  Administered 2017-10-14: 100 mL
  Filled 2017-10-14: qty 100

## 2017-10-14 MED ORDER — GABAPENTIN 300 MG PO CAPS
300.0000 mg | ORAL_CAPSULE | Freq: Once | ORAL | Status: AC
  Administered 2017-10-14: 300 mg via ORAL
  Filled 2017-10-14: qty 1

## 2017-10-14 MED ORDER — LOSARTAN POTASSIUM 50 MG PO TABS
50.0000 mg | ORAL_TABLET | Freq: Every day | ORAL | Status: DC
  Administered 2017-10-14 – 2017-10-19 (×6): 50 mg via ORAL
  Filled 2017-10-14 (×7): qty 1

## 2017-10-14 MED ORDER — OXYCODONE HCL 5 MG PO TABS
5.0000 mg | ORAL_TABLET | Freq: Four times a day (QID) | ORAL | Status: DC | PRN
  Administered 2017-10-14 – 2017-10-20 (×13): 5 mg via ORAL
  Filled 2017-10-14 (×14): qty 1

## 2017-10-14 NOTE — ED Notes (Signed)
PT requesting to eat. MD paged/ pt notified.

## 2017-10-14 NOTE — ED Notes (Signed)
ED Provider at bedside. 

## 2017-10-14 NOTE — Progress Notes (Signed)
[facility] paged informed that patient CBG is 72 and that patient is NPO. New orders received will continue to monitor. Arthor Captain LPN

## 2017-10-14 NOTE — Progress Notes (Signed)
Pharmacy Antibiotic Note  Marissa Ryan is a 84 y.o. female admitted on 10/13/2017 with intra-abdominal infection/ colitis.  Pharmacy has been consulted for unasyn dosing. Noted reactions documented for penicillin and ancef. Allergies discussed with admitting provider and she believes this regimen is better option than cipro/flagyl in this elderly patient.   Plan: Unasyn 1.5g IV q6 Monitor for itching and rash.      Temp (24hrs), Avg:98.9 F (37.2 C), Min:98.9 F (37.2 C), Max:98.9 F (37.2 C)  Recent Labs  Lab 10/14/17 0015 10/14/17 0438 10/14/17 0613  WBC 12.0*  --  8.1  CREATININE 0.64  --   --   LATICACIDVEN  --  0.82  --     CrCl cannot be calculated (Unknown ideal weight.).    Allergies  Allergen Reactions  . Robaxin [Methocarbamol] Rash    Red rash on right side of face and forehead cleared after benadryl given ( per [name] )  . Ancef [Cefazolin] Diarrhea  . Morphine And Related Itching  . Penicillins Itching  . Flurbiprofen Diarrhea    Thank you for allowing pharmacy to be a part of this patient's care.  Jodean Lima Jiyaan Steinhauser 10/14/2017 7:45 AM

## 2017-10-14 NOTE — ED Notes (Signed)
Report Given to RN 

## 2017-10-14 NOTE — ED Notes (Signed)
Attempted Report 

## 2017-10-14 NOTE — H&P (Signed)
History and Physical    Marissa Ryan [id number]:[id number] DOB: [birth year] DOA: 10/13/2017   PCP: Crist Infante, MD   Patient coming from:  Home    Chief Complaint: Right abdominal pain   HPI: Marissa Ryan is a 84 y.o. female with medical history significant for hypertension, hyperlipidemia, diabetes, osteoarthritis, history of IBS, anxiety, and history of gallstones, presenting with right sided abdominal pain, mild epigastric abdominal pain since yesterday evening. Patient took some Pepto-Bismol, initially helping her, although the pain got worse for which he presented for further evaluation. The pain is described as burning, non radiating.  She denies any other areas of pain.  She denies any nausea or vomiting. She has intermittent diarrhea, which she states he may be related to medications, and her appetite has been decreased without significant weight loss.  She is able to pass flatus. she denies any chest pain, or palpitations.  He denies any shortness of breath, or cough.  His any dysuria or gross hematuria. No confusion.  She denies any lower extremity swelling or calf pain her granddaughter reports that the patient has been very weak, falling frequently (mechanical falls).  She takes aspirin on a daily basis, with no other NSAIDs. Patient reports prior surgeries in the abdomen, including hysterectomy, and cholecystectomy.  No tobacco, alcohol or recreational drug use.    ED Course:  BP (!) 132/52   Pulse (!) 57   Temp 98.9 F (37.2 C) (Oral)   Resp (!) 22   SpO2 97%   Sodium 130 Glucose 119 Creatinine 0.64 Lipase 38 White count 8.1, hemoglobin 9.9 was 10.9 prior draw 5 hours ago Lactic acid 0.82 Platelets 447 Troponin less than 0.03 Hemoccult negative (conflicting results) CT of the abdomen and pelvis Underdistention of the descending colon versus mild colitis,  Sigmoid diverticulosis.  No bowel obstruction.      Review of Systems:  As per HPI otherwise all other systems reviewed  and are negative  Past Medical History:  Diagnosis Date  . Anxiety   . Arthritis   . Cancer ([place])    hx of skin cancer removal on hand   . Diabetes mellitus without complication ([place])   . Gallstones   . Hyperlipidemia   . Hypertension   . IBS (irritable bowel syndrome)   . Osteoporosis     Past Surgical History:  Procedure Laterality Date  . APPENDECTOMY    . BACK SURGERY     x2  . cataract surgery      bilateral   . CHOLECYSTECTOMY  1999  . EYE SURGERY     for glaucoma   . left eye peeling surgery     . left femur bone surgery      1978  . REVERSE SHOULDER ARTHROPLASTY Right 10/10/2014   Procedure: RIGHT REVERSE SHOULDER ARTHROPLASTY;  Surgeon: Justice Britain, MD;  Location: [facility];  Service: Orthopedics;  Laterality: Right;  . ROTATOR CUFF REPAIR    . TOTAL HIP ARTHROPLASTY Left 02/20/2014   Procedure: LEFT TOTAL HIP ARTHROPLASTY ANTERIOR APPROACH;  Surgeon: Gearlean Alf, MD;  Location: [facility];  Service: Orthopedics;  Laterality: Left;  . WRIST SURGERY     left     Social History Social History   Socioeconomic History  . Marital status: Married    Spouse name: Not on file  . Number of children: Not on file  . Years of education: Not on file  . Highest education level: Not on file  Occupational History  .  Not on file  Social Needs  . Financial resource strain: Not on file  . Food insecurity:    Worry: Not on file    Inability: Not on file  . Transportation needs:    Medical: Not on file    Non-medical: Not on file  Tobacco Use  . Smoking status: Never Smoker  . Smokeless tobacco: Never Used  Substance and Sexual Activity  . Alcohol use: Yes    Comment: rare glass of wine   . Drug use: No  . Sexual activity: Not on file  Lifestyle  . Physical activity:    Days per week: Not on file    Minutes per session: Not on file  . Stress: Not on file  Relationships  . Social connections:    Talks on phone: Not on file    Gets together: Not on file     Attends religious service: Not on file    Active member of club or organization: Not on file    Attends meetings of clubs or organizations: Not on file    Relationship status: Not on file  . Intimate partner violence:    Fear of current or ex partner: Not on file    Emotionally abused: Not on file    Physically abused: Not on file    Forced sexual activity: Not on file  Other Topics Concern  . Not on file  Social History Narrative   3 cups of coffee a day     Allergies  Allergen Reactions  . Robaxin [Methocarbamol] Rash    Red rash on right side of face and forehead cleared after benadryl given ( per [name] )  . Ancef [Cefazolin] Diarrhea  . Morphine And Related Itching  . Penicillins Itching  . Flurbiprofen Diarrhea    Family History  Problem Relation Age of Onset  . Stroke Mother   . Hypertension Mother   . Ovarian cancer Sister       Prior to Admission medications   Medication Sig Start Date End Date Taking? Authorizing Provider  ALPRAZolam (XANAX) 0.25 MG tablet Take 0.25 mg by mouth at bedtime.     [provider]  amLODipine (NORVASC) 5 MG tablet Take 5 mg by mouth daily.  12/30/16   [provider]  aspirin EC 81 MG tablet Take 81 mg by mouth daily.    [provider]  azithromycin (ZITHROMAX) 250 MG tablet Take 1 tablet (250 mg total) by mouth daily. Take 1 every day until finished. 01/15/17   Mesner, Corene Cornea, MD  Cinnamon 500 MG capsule Take 500 mg by mouth daily.    [provider]  docusate sodium 100 MG CAPS Take 100 mg by mouth 2 (two) times daily. Patient taking differently: Take 100 mg by mouth daily as needed (constipation).  02/25/14   Perkins, Alexzandrew L, PA-C  ezetimibe-simvastatin (VYTORIN) 10-40 MG per tablet Take 0.5 tablets by mouth at bedtime.     [provider]  furosemide (LASIX) 20 MG tablet Take 20 mg by mouth daily.     [provider]  gabapentin (NEURONTIN) 300 MG capsule Take 300 mg by  mouth 3 (three) times daily as needed (pain).     [provider]  losartan (COZAAR) 50 MG tablet Take 50 mg by mouth daily with supper.     [provider]  metFORMIN (GLUCOPHAGE) 500 MG tablet Take 500 mg by mouth daily with breakfast.    [provider]  oxyCODONE-acetaminophen (PERCOCET)  10-325 MG tablet Take 1 tablet by mouth every 6 (six) hours as needed for pain.  12/22/16   [provider]  valsartan (DIOVAN) 320 MG tablet Take 320 mg by mouth daily.  01/10/17   [provider]    Physical Exam:  Vitals:   10/13/17 2320 10/14/17 0345 10/14/17 0634 10/14/17 0700  BP: (!) 141/114 (!) 150/57 (!) 126/50 (!) 132/52  Pulse: 87 80 66 (!) 57  Resp: 14 17 11  (!) 22  Temp: 98.9 F (37.2 C)     TempSrc: Oral     SpO2: 99% 96% 97% 97%   Constitutional: NAD, calm, appears more comfortable after receiving fentanyl Eyes: PERRL, lids and conjunctivae normal ENMT: Mucous membranes are moist, without exudate or lesions  Neck: normal, supple, no masses, no thyromegaly Respiratory: clear to auscultation bilaterally, no wheezing, no crackles. Normal respiratory effort  Cardiovascular: Regular rate and rhythm, no murmur, rubs or gallops. No extremity edema. 2+ pedal pulses. No carotid bruits.  Abdomen: Soft, somewhat tender palpation in the epigastrium, but the pain is mostly located in the right lower quadrant.  No hepatosplenomegaly. Bowel sounds positive.  Musculoskeletal: no clubbing / cyanosis. Moves all extremities Skin: no jaundice, No lesions.  Neurologic: Sensation intact  Strength equal in all extremities Psychiatric:   Alert and oriented x 3. Normal mood.     Labs on Admission: I have personally reviewed following labs and imaging studies  CBC: Recent Labs  Lab 10/14/17 0015 10/14/17 0613  WBC 12.0* 8.1  NEUTROABS 9.4*  --   HGB 10.9* 9.9*  HCT 33.9* 30.1*  MCV 93.4 93.8  PLT 447* 756    Basic Metabolic Panel: Recent Labs  Lab  10/14/17 0015  NA 130*  K 3.7  CL 98*  CO2 19*  GLUCOSE 119*  BUN 8  CREATININE 0.64  CALCIUM 9.3    GFR: CrCl cannot be calculated (Unknown ideal weight.).  Liver Function Tests: Recent Labs  Lab 10/14/17 0015  AST 32  ALT 23  ALKPHOS 58  BILITOT 0.6  PROT 6.2*  ALBUMIN 3.5   Recent Labs  Lab 10/14/17 0015  LIPASE 38   No results for input(s): AMMONIA in the last 168 hours.  Coagulation Profile: No results for input(s): INR, PROTIME in the last 168 hours.  Cardiac Enzymes: Recent Labs  Lab 10/14/17 0015  TROPONINI <0.03    BNP (last 3 results) No results for input(s): PROBNP in the last 8760 hours.  HbA1C: No results for input(s): HGBA1C in the last 72 hours.  CBG: No results for input(s): GLUCAP in the last 168 hours.  Lipid Profile: No results for input(s): CHOL, HDL, LDLCALC, TRIG, CHOLHDL, LDLDIRECT in the last 72 hours.  Thyroid Function Tests: No results for input(s): TSH, T4TOTAL, FREET4, T3FREE, THYROIDAB in the last 72 hours.  Anemia Panel: No results for input(s): VITAMINB12, FOLATE, FERRITIN, TIBC, IRON, RETICCTPCT in the last 72 hours.  Urine analysis:    Component Value Date/Time   COLORURINE YELLOW 10/12/2014 0315   APPEARANCEUR CLOUDY (A) 10/12/2014 0315   LABSPEC 1.010 10/12/2014 0315   PHURINE 6.0 10/12/2014 0315   GLUCOSEU NEGATIVE 10/12/2014 0315   HGBUR MODERATE (A) 10/12/2014 0315   BILIRUBINUR NEGATIVE 10/12/2014 0315   KETONESUR NEGATIVE 10/12/2014 0315   PROTEINUR NEGATIVE 10/12/2014 0315   UROBILINOGEN 0.2 10/12/2014 0315   NITRITE POSITIVE (A) 10/12/2014 0315   LEUKOCYTESUR LARGE (A) 10/12/2014 0315    Sepsis Labs: @LABRCNTIP (procalcitonin:4,lacticidven:4) )No results found for this or any  previous visit (from the past 240 hour(s)).   Radiological Exams on Admission: Ct Abdomen Pelvis W Contrast  Result Date: 10/14/2017 CLINICAL DATA:  84 year old female with acute generalized abdominal pain. EXAM: CT  ABDOMEN AND PELVIS WITH CONTRAST TECHNIQUE: Multidetector CT imaging of the abdomen and pelvis was performed using the standard protocol following bolus administration of intravenous contrast. CONTRAST:  175mL ISOVUE-300 IOPAMIDOL (ISOVUE-300) INJECTION 61% COMPARISON:  None. FINDINGS: Lower chest: The visualized lung bases are clear. No intra-abdominal free air or free fluid. Hepatobiliary: Cholecystectomy.  The liver is unremarkable. Pancreas: Unremarkable. No pancreatic ductal dilatation or surrounding inflammatory changes. Spleen: Normal in size without focal abnormality. Adrenals/Urinary Tract: The adrenal glands are unremarkable. There is no hydronephrosis on either side. The visualized ureters and urinary bladder appear unremarkable. Stomach/Bowel: There is extensive sigmoid diverticulosis with muscular hypertrophy. Mild thickened appearance of the descending colon, likely related to underdistention. Colitis is less likely. Clinical correlation is recommended. There is no bowel obstruction. Appendectomy. Vascular/Lymphatic: Mild aortoiliac atherosclerotic disease. The origins of the celiac axis, SMA, IMA remain patent. No portal venous gas. There is no adenopathy. Reproductive: The uterus is not visualized, likely atrophic or surgically absent. No pelvic mass. Other: None Musculoskeletal: Total left hip arthroplasty. There is osteopenia with scoliosis and degenerative changes of the spine. Multilevel old-appearing compression deformities. No acute osseous pathology. IMPRESSION: 1. Underdistention of the descending colon versus mild colitis. Clinical correlation is recommended. 2. Sigmoid diverticulosis.  No bowel obstruction. 3.  Aortic Atherosclerosis (ICD10-I70.0). Electronically Signed   By: Anner Crete M.D.   On: 10/14/2017 03:12    EKG: Independently reviewed.  Assessment/Plan Principal Problem:   Abdominal pain Active Problems:   Essential hypertension   Hyperlipidemia   Diabetes (HCC)    IBS (irritable bowel syndrome)   Anxiety   Anemia    RLQ abdominal pain, likely due to colitis and diverticulosis per CT A/P, in a patient with IBS. No SBO seen. Guaiac has been reordered due to conflicting results. Hb 9.9. Intermittent diarrhea. WBC normal 8.1. Lactic acid 0.82 normal . Lipase 38  Admit to tele IP.   GI panel NPO for now, may advance to clear liquids if symptoms improve and no GIB is present, at which time may need GI evaluation  Careful use of narcotics, will add Ultram  IVF at 100 cc/h  Unasyn per Pharmacy  Antiemetics prn  Anemia, multifactorial, rule out GIB  Hemoglobin on admission 10.9->9.9 . Hcult   Serial CBC  Hemoccult No transfusion is indicated at this time    Hyponatremia  likely due to diuretics and decreased oral intake   No neuro deficits sodium 130 with a glucose of 119 Hold diuretics  IV normal saline, repeat BMET today to avoid overcorrection  Consider SIADH w/u if values do not improve   Repeat BMET today Avoid correcting more than 2meq over 24 hour    Hypertension BP   132/52   Pulse 57   Controlled Continue home anti-hypertensive medications  ASA on hold today   Hyperlipidemia Continue home statins   Type II Diabetes with neuropathy Current blood sugar level is 119 No results found for: HGBA1C Hgb A1C Hold home oral diabetic medications.   SSI Continue Neurontin  Anxiety Continue home Xanax  Debilitation/ falls/recent fractures requiring splints L arm Obtain PT/OT evaluation Fall precautions   DVT prophylaxis: SCD Code Status:    Full Family Communication:  Discussed with patient Disposition Plan: Expect patient to be discharged to home after condition improves  Consults called:    None Admission status: Inpatient telemetry   Sharene Butters, [place] [facility]   Amion text  [phone]   10/14/2017, 8:19 AM

## 2017-10-14 NOTE — ED Notes (Signed)
Unable to give scheduled norvasc, pharmacy sent note to verify this.

## 2017-10-15 DIAGNOSIS — R1011 Right upper quadrant pain: Secondary | ICD-10-CM

## 2017-10-15 DIAGNOSIS — K588 Other irritable bowel syndrome: Secondary | ICD-10-CM

## 2017-10-15 DIAGNOSIS — L8990 Pressure ulcer of unspecified site, unspecified stage: Secondary | ICD-10-CM

## 2017-10-15 LAB — CBC
HEMATOCRIT: 33.3 % — AB (ref 36.0–46.0)
HEMOGLOBIN: 10.6 g/dL — AB (ref 12.0–15.0)
MCH: 30 pg (ref 26.0–34.0)
MCHC: 31.8 g/dL (ref 30.0–36.0)
MCV: 94.3 fL (ref 78.0–100.0)
Platelets: 435 10*3/uL — ABNORMAL HIGH (ref 150–400)
RBC: 3.53 MIL/uL — AB (ref 3.87–5.11)
RDW: 13.4 % (ref 11.5–15.5)
WBC: 9.2 10*3/uL (ref 4.0–10.5)

## 2017-10-15 LAB — GLUCOSE, CAPILLARY
GLUCOSE-CAPILLARY: 141 mg/dL — AB (ref 65–99)
GLUCOSE-CAPILLARY: 188 mg/dL — AB (ref 65–99)
GLUCOSE-CAPILLARY: 117 mg/dL — AB (ref 65–99)
Glucose-Capillary: 181 mg/dL — ABNORMAL HIGH (ref 65–99)
Glucose-Capillary: 200 mg/dL — ABNORMAL HIGH (ref 65–99)
Glucose-Capillary: 247 mg/dL — ABNORMAL HIGH (ref 65–99)

## 2017-10-15 LAB — BASIC METABOLIC PANEL
Anion gap: 8 (ref 5–15)
BUN: <5 mg/dL — ABNORMAL LOW (ref 6–20)
CO2: 21 mmol/L — ABNORMAL LOW (ref 22–32)
Calcium: 8.2 mg/dL — ABNORMAL LOW (ref 8.9–10.3)
Chloride: 107 mmol/L (ref 101–111)
Creatinine, Ser: 0.6 mg/dL (ref 0.44–1.00)
GFR calc non Af Amer: >60 mL/min (ref >60)
Glucose, Bld: 129 mg/dL — ABNORMAL HIGH (ref 65–99)
POTASSIUM: 3.1 mmol/L — AB (ref 3.5–5.1)
SODIUM: 136 mmol/L (ref 135–145)

## 2017-10-15 LAB — TROPONIN I

## 2017-10-15 MED ORDER — POLYVINYL ALCOHOL 1.4 % OP SOLN
1.0000 [drp] | OPHTHALMIC | Status: DC | PRN
  Administered 2017-10-16 – 2017-10-19 (×3): 1 [drp] via OPHTHALMIC
  Filled 2017-10-15: qty 15

## 2017-10-15 MED ORDER — DEXTROSE 50 % IV SOLN
INTRAVENOUS | Status: AC
  Administered 2017-10-15: 50 mL
  Filled 2017-10-15: qty 50

## 2017-10-15 MED ORDER — SODIUM CHLORIDE 0.9 % IV SOLN
INTRAVENOUS | Status: DC
  Administered 2017-10-15 – 2017-10-20 (×8): via INTRAVENOUS

## 2017-10-15 MED ORDER — POTASSIUM CHLORIDE CRYS ER 20 MEQ PO TBCR
40.0000 meq | EXTENDED_RELEASE_TABLET | Freq: Once | ORAL | Status: AC
Start: 2017-10-15 — End: 2017-10-15
  Administered 2017-10-15: 40 meq via ORAL
  Filled 2017-10-15: qty 2

## 2017-10-15 MED ORDER — SERTRALINE HCL 50 MG PO TABS
50.0000 mg | ORAL_TABLET | Freq: Every day | ORAL | Status: DC
  Administered 2017-10-15 – 2017-10-20 (×6): 50 mg via ORAL
  Filled 2017-10-15 (×6): qty 1

## 2017-10-15 MED ORDER — PREDNISOLONE ACETATE 1 % OP SUSP
1.0000 [drp] | Freq: Two times a day (BID) | OPHTHALMIC | Status: DC
  Administered 2017-10-15 – 2017-10-20 (×10): 1 [drp] via OPHTHALMIC
  Filled 2017-10-15 (×3): qty 5

## 2017-10-15 MED ORDER — SIMVASTATIN 20 MG PO TABS: 20.0000 mg | ORAL_TABLET | Freq: Every day | ORAL | Status: DC

## 2017-10-15 MED ORDER — MUSCLE RUB 10-15 % EX CREA
TOPICAL_CREAM | CUTANEOUS | Status: DC | PRN
  Administered 2017-10-17 – 2017-10-19 (×2): via TOPICAL
  Filled 2017-10-15: qty 85

## 2017-10-15 MED ORDER — CARBOXYMETHYLCELLULOSE SODIUM 0.5 % OP SOLN: 1.0000 [drp] | OPHTHALMIC | Status: DC | PRN

## 2017-10-15 NOTE — Progress Notes (Signed)
Hypoglycemic Event  CBG: 67  Treatment: amp D50 patient NPO  Symptoms: asymptomatic  Follow-up CBG: Time:0021 CBG Result:247  Possible Reasons for Event: patient NPO  Comments/MD notified:Yes   Marissa Ryan, Marissa Ryan

## 2017-10-15 NOTE — Progress Notes (Signed)
Patient ID: Marissa Ryan, female   DOB: 1925/03/11, 84 y.o.   MRN: [id number]  PROGRESS NOTE    Marissa Ryan  KYH:[id number] DOB: [birth year] DOA: 10/13/2017 PCP: Crist Infante, MD    Brief Narrative: Marissa Ryan is a 84 y.o. female with a Past Medical History significant for htn, hld, OA, anxiety who presents with R abd pain. She has history of IBS as well as possible colitis on CT has anxiety disorder. Has acute on chronic low back pain.  Assessment & Plan:   Principal Problem:   Abdominal pain Active Problems:   Essential hypertension   Hyperlipidemia   Diabetes (HCC)   IBS (irritable bowel syndrome)   Anxiety   Anemia   Pressure injury of skin   1 RUQ abdominal pain: Suspect colitis.  Continue empiric antibiotics.  Start clear liquid diet if tolerated.  Monitor patient's symptoms.  2.  Low back pain: Patient has chronic low back pain.  Continue home regimen.  On oxycodone and tramadol.  3 anxiety disorder: Continue Ativan  4 Diabetes: Appears to be controlled.  5. Hyperlipidemia: Continue statins  6.  Hypertension: Blood pressure is controlled now.  Continue management  7.  Hyponatremia: sodium slowly improved and 131.  Continue fluids    DVT prophylaxis: SCD  Code Status: Full  Family Communication: None available  Disposition Plan: Home  Consultants:   None  Procedures:  Antimicrobials: Unasyn day 1  Subjective: Patient has right upper quadrant abdominal pain with distention.  Also epigastric discomfort  Objective: Vitals:   10/14/17 2341 10/15/17 0532 10/15/17 0959 10/15/17 1330  BP: (!) 146/80 (!) 126/49 (!) 145/71 (!) 152/56  Pulse: 68 65 69 65  Resp: 16 18  17   Temp: 97.7 F (36.5 C) 97.7 F (36.5 C)  98.8 F (37.1 C)  TempSrc: Oral Oral  Oral  SpO2: 98% 97%  99%  Weight:  60.6 kg (133 lb 9.6 oz)    Height:        Intake/Output Summary (Last 24 hours) at 10/15/2017 1451 Last data filed at 10/15/2017 1330 Gross per 24 hour  Intake 460  ml  Output 300 ml  Net 160 ml   Filed Weights   10/14/17 1756 10/15/17 0532  Weight: 61.7 kg (136 lb 0.4 oz) 60.6 kg (133 lb 9.6 oz)    Examination:  General exam: Appears calm and comfortable  Respiratory system: Clear to auscultation. Respiratory effort normal. Cardiovascular system: S1 & S2 heard, RRR. No JVD, murmurs, rubs, gallops or clicks. No pedal edema. Gastrointestinal system: Abdomen isdistended, soft and mildly tender. No organomegaly or masses felt. Normal bowel sounds heard. Central nervous system: Alert and oriented. No focal neurological deficits. Extremities: Symmetric 5 x 5 power. Skin: No rashes, lesions or ulcers Psychiatry: Judgement and insight appear normal. Mood & affect appropriate.     Data Reviewed: I have personally reviewed following labs and imaging studies  CBC: Recent Labs  Lab 10/14/17 0015 10/14/17 0613 10/14/17 1529 10/15/17 0640  WBC 12.0* 8.1 11.6* 9.2  NEUTROABS 9.4*  --   --   --   HGB 10.9* 9.9* 10.9* 10.6*  HCT 33.9* 30.1* 33.5* 33.3*  MCV 93.4 93.8 93.3 94.3  PLT 447* 397 440* 151*   Basic Metabolic Panel: Recent Labs  Lab 10/14/17 0015 10/14/17 1529 10/15/17 0640  NA 130* 131* 136  K 3.7 3.5 3.1*  CL 98* 102 107  CO2 19* 20* 21*  GLUCOSE 119* 89 129*  BUN 8 5* <  5*  CREATININE 0.64 0.59 0.60  CALCIUM 9.3 8.5* 8.2*   GFR: Estimated Creatinine Clearance: 39 mL/min (by C-G formula based on SCr of 0.6 mg/dL). Liver Function Tests: Recent Labs  Lab 10/14/17 0015  AST 32  ALT 23  ALKPHOS 58  BILITOT 0.6  PROT 6.2*  ALBUMIN 3.5   Recent Labs  Lab 10/14/17 0015  LIPASE 38   No results for input(s): AMMONIA in the last 168 hours. Coagulation Profile: No results for input(s): INR, PROTIME in the last 168 hours. Cardiac Enzymes: Recent Labs  Lab 10/14/17 0015 10/14/17 0722 10/14/17 1529 10/14/17 2331  TROPONINI <0.03 <0.03 <0.03 <0.03   BNP (last 3 results) No results for input(s): PROBNP in the last  8760 hours. HbA1C: Recent Labs    10/14/17 0717  HGBA1C 6.7*   CBG: Recent Labs  Lab 10/14/17 2349 10/15/17 0021 10/15/17 0327 10/15/17 0809 10/15/17 1242  GLUCAP 67 247* 181* 141* 188*   Lipid Profile: No results for input(s): CHOL, HDL, LDLCALC, TRIG, CHOLHDL, LDLDIRECT in the last 72 hours. Thyroid Function Tests: No results for input(s): TSH, T4TOTAL, FREET4, T3FREE, THYROIDAB in the last 72 hours. Anemia Panel: No results for input(s): VITAMINB12, FOLATE, FERRITIN, TIBC, IRON, RETICCTPCT in the last 72 hours. Urine analysis:    Component Value Date/Time   COLORURINE STRAW (A) 10/14/2017 0722   APPEARANCEUR CLEAR 10/14/2017 0722   LABSPEC 1.006 10/14/2017 0722   PHURINE 7.0 10/14/2017 0722   GLUCOSEU NEGATIVE 10/14/2017 0722   HGBUR SMALL (A) 10/14/2017 0722   BILIRUBINUR NEGATIVE 10/14/2017 0722   KETONESUR 20 (A) 10/14/2017 0722   PROTEINUR NEGATIVE 10/14/2017 0722   UROBILINOGEN 0.2 10/12/2014 0315   NITRITE NEGATIVE 10/14/2017 0722   LEUKOCYTESUR NEGATIVE 10/14/2017 0722   Sepsis Labs: @LABRCNTIP (procalcitonin:4,lacticidven:4)  )No results found for this or any previous visit (from the past 240 hour(s)).       Radiology Studies: Ct Abdomen Pelvis W Contrast  Result Date: 10/14/2017 CLINICAL DATA:  84 year old female with acute generalized abdominal pain. EXAM: CT ABDOMEN AND PELVIS WITH CONTRAST TECHNIQUE: Multidetector CT imaging of the abdomen and pelvis was performed using the standard protocol following bolus administration of intravenous contrast. CONTRAST:  185mL ISOVUE-300 IOPAMIDOL (ISOVUE-300) INJECTION 61% COMPARISON:  None. FINDINGS: Lower chest: The visualized lung bases are clear. No intra-abdominal free air or free fluid. Hepatobiliary: Cholecystectomy.  The liver is unremarkable. Pancreas: Unremarkable. No pancreatic ductal dilatation or surrounding inflammatory changes. Spleen: Normal in size without focal abnormality. Adrenals/Urinary Tract:  The adrenal glands are unremarkable. There is no hydronephrosis on either side. The visualized ureters and urinary bladder appear unremarkable. Stomach/Bowel: There is extensive sigmoid diverticulosis with muscular hypertrophy. Mild thickened appearance of the descending colon, likely related to underdistention. Colitis is less likely. Clinical correlation is recommended. There is no bowel obstruction. Appendectomy. Vascular/Lymphatic: Mild aortoiliac atherosclerotic disease. The origins of the celiac axis, SMA, IMA remain patent. No portal venous gas. There is no adenopathy. Reproductive: The uterus is not visualized, likely atrophic or surgically absent. No pelvic mass. Other: None Musculoskeletal: Total left hip arthroplasty. There is osteopenia with scoliosis and degenerative changes of the spine. Multilevel old-appearing compression deformities. No acute osseous pathology. IMPRESSION: 1. Underdistention of the descending colon versus mild colitis. Clinical correlation is recommended. 2. Sigmoid diverticulosis.  No bowel obstruction. 3.  Aortic Atherosclerosis (ICD10-I70.0). Electronically Signed   By: Anner Crete M.D.   On: 10/14/2017 03:12        Scheduled Meds: . ALPRAZolam  0.25 mg Oral  QHS  . amLODipine  5 mg Oral Daily  . ezetimibe-simvastatin  0.5 tablet Oral QHS  . insulin aspart  0-9 Units Subcutaneous TID WC  . losartan  50 mg Oral Q supper  . prednisoLONE acetate  1 drop Right Eye BID  . sertraline  50 mg Oral Daily   Continuous Infusions: . ampicillin-sulbactam (UNASYN) IV 1.5 g (10/15/17 1403)  . dextrose 5 % and 0.9% NaCl 100 mL/hr at 10/15/17 1239     LOS: 1 day    Time spent: 79 minutes   Avonda Toso,LAWAL, MD [facility] Pager [phone]  If 7PM-7AM, please contact night-coverage [email or web address] Password [email or web address] 10/15/2017, 2:51 PM

## 2017-10-15 NOTE — Evaluation (Signed)
Physical Therapy Evaluation Patient Details Name: Marissa Ryan MRN: [id number] DOB: 1924/07/31 Today's Date: 10/15/2017   History of Present Illness  LAJUANNA Ryan is a 84 y.o. female with a Past Medical History significant for htn, hld, OA, anxiety who presents with R abd pain.  Possibly colitis and GIB.   Clinical Impression  Pt admitted with above diagnosis. Pt currently with functional limitations due to the deficits listed below (see PT Problem List). Pt was able to ambulate with RW to bathroom with min assist with poor overall safety needing incr cues and steadying assist several times.  Will need SNF for therapy.  Pt will benefit from skilled PT to increase their independence and safety with mobility to allow discharge to the venue listed below.      Follow Up Recommendations SNF;Supervision/Assistance - 24 hour(Prefers [facility] in [place])    Equipment Recommendations  None recommended by PT    Recommendations for Other Services       Precautions / Restrictions Precautions Precautions: Fall Precaution Comments: left wrist brace due to recent sprain per pt Restrictions Weight Bearing Restrictions: No      Mobility  Bed Mobility Overal bed mobility: Needs Assistance Bed Mobility: Supine to Sit     Supine to sit: Min assist     General bed mobility comments: Assist for elevation of trunk   Transfers Overall transfer level: Needs assistance Equipment used: Rolling walker (2 wheeled) Transfers: Sit to/from Stand Sit to Stand: Min assist         General transfer comment: steadying assist needed adn cues for walker safety.  Cues for hand placement as well.   Ambulation/Gait Ambulation/Gait assistance: Min assist Ambulation Distance (Feet): 30 Feet(15 feet x 2) Assistive device: Rolling walker (2 wheeled) Gait Pattern/deviations: Step-through pattern;Decreased stride length;Staggering left;Staggering right;Trunk flexed;Wide base of support   Gait velocity  interpretation: Below normal speed for age/gender General Gait Details: Pt with poor safety needing cues for sequencing steps and RW.  Pt with LOB needing steadying assist several times during gait.  Walked into bathroom and back to bed.   Stairs            Wheelchair Mobility    Modified Rankin (Stroke Patients Only)       Balance Overall balance assessment: Needs assistance;History of Falls Sitting-balance support: No upper extremity supported;Feet supported Sitting balance-Leahy Scale: Good     Standing balance support: Bilateral upper extremity supported;During functional activity Standing balance-Leahy Scale: Poor Standing balance comment: relies on UE support and poor safety awareness.                              Pertinent Vitals/Pain Pain Assessment: No/denies pain    Home Living Family/patient expects to be discharged to:: Private residence Living Arrangements: Other relatives;Non-relatives/Friends Available Help at Discharge: Family;Available PRN/intermittently Type of Home: House Home Access: Stairs to enter Entrance Stairs-Rails: Right Entrance Stairs-Number of Steps: 2 Home Layout: One level Home Equipment: Walker - 2 wheels;Walker - 4 wheels;Cane - single point;Bedside commode;Shower seat;Hand held shower head;Grab bars - tub/shower Additional Comments: Husband died in 2023/08/09.  She has recently begun using rollator per pt that she borrowed.     Prior Function Level of Independence: Independent with assistive device(s)         Comments: has had a recent fall     Hand Dominance   Dominant Hand: Left    Extremity/Trunk Assessment   Upper Extremity  Assessment Upper Extremity Assessment: Defer to OT evaluation    Lower Extremity Assessment Lower Extremity Assessment: Generalized weakness    Cervical / Trunk Assessment Cervical / Trunk Assessment: Kyphotic  Communication   Communication: HOH  Cognition Arousal/Alertness:  Awake/alert Behavior During Therapy: WFL for tasks assessed/performed Overall Cognitive Status: Within Functional Limits for tasks assessed                                        General Comments      Exercises     Assessment/Plan    PT Assessment Patient needs continued PT services  PT Problem List Decreased activity tolerance;Decreased balance;Decreased mobility;Decreased knowledge of use of DME;Decreased safety awareness;Decreased knowledge of precautions       PT Treatment Interventions DME instruction;Gait training;Functional mobility training;Therapeutic activities;Therapeutic exercise;Balance training;Patient/family education    PT Goals (Current goals can be found in the Care Plan section)  Acute Rehab PT Goals Patient Stated Goal: to get better PT Goal Formulation: With patient Time For Goal Achievement: 10/29/17 Potential to Achieve Goals: Good    Frequency Min 3X/week   Barriers to discharge Decreased caregiver support      Co-evaluation               AM-PAC PT "6 Clicks" Daily Activity  Outcome Measure Difficulty turning over in bed (including adjusting bedclothes, sheets and blankets)?: None Difficulty moving from lying on back to sitting on the side of the bed? : Unable Difficulty sitting down on and standing up from a chair with arms (e.g., wheelchair, bedside commode, etc,.)?: A Little Help needed moving to and from a bed to chair (including a wheelchair)?: A Little Help needed walking in hospital room?: A Little Help needed climbing 3-5 steps with a railing? : A Lot 6 Click Score: 16    End of Session Equipment Utilized During Treatment: Gait belt Activity Tolerance: Patient limited by fatigue Patient left: with call bell/phone within reach;in bed;with bed alarm set Nurse Communication: Mobility status PT Visit Diagnosis: Muscle weakness (generalized) (M62.81);Unsteadiness on feet (R26.81)    Time: 6440-3474 PT Time  Calculation (min) (ACUTE ONLY): 24 min   Charges:   PT Evaluation $PT Eval Moderate Complexity: 1 Mod PT Treatments $Gait Training: 8-22 mins   PT G Codes:        Khalea Ventura,PT Acute Rehabilitation [phone] (pager)   Denice Paradise 10/15/2017, 4:32 PM

## 2017-10-16 DIAGNOSIS — R1032 Left lower quadrant pain: Secondary | ICD-10-CM

## 2017-10-16 DIAGNOSIS — K581 Irritable bowel syndrome with constipation: Secondary | ICD-10-CM

## 2017-10-16 LAB — COMPREHENSIVE METABOLIC PANEL
ALBUMIN: 3 g/dL — AB (ref 3.5–5.0)
ALK PHOS: 62 U/L (ref 38–126)
ALT: 22 U/L (ref 14–54)
AST: 26 U/L (ref 15–41)
Anion gap: 7 (ref 5–15)
CALCIUM: 8.2 mg/dL — AB (ref 8.9–10.3)
CO2: 22 mmol/L (ref 22–32)
CREATININE: 0.49 mg/dL (ref 0.44–1.00)
Chloride: 106 mmol/L (ref 101–111)
GFR calc non Af Amer: >60 mL/min (ref >60)
GLUCOSE: 125 mg/dL — AB (ref 65–99)
Potassium: 3.1 mmol/L — ABNORMAL LOW (ref 3.5–5.1)
Sodium: 135 mmol/L (ref 135–145)
Total Bilirubin: 0.2 mg/dL — ABNORMAL LOW (ref 0.3–1.2)
Total Protein: 5.6 g/dL — ABNORMAL LOW (ref 6.5–8.1)

## 2017-10-16 LAB — CBC WITH DIFFERENTIAL/PLATELET
BASOS ABS: 0 10*3/uL (ref 0.0–0.1)
Basophils Relative: 0 %
EOS ABS: 0.1 10*3/uL (ref 0.0–0.7)
Eosinophils Relative: 1 %
HCT: 33.6 % — ABNORMAL LOW (ref 36.0–46.0)
HEMOGLOBIN: 10.7 g/dL — AB (ref 12.0–15.0)
LYMPHS ABS: 0.6 10*3/uL — AB (ref 0.7–4.0)
LYMPHS PCT: 7 %
MCH: 30.2 pg (ref 26.0–34.0)
MCHC: 31.8 g/dL (ref 30.0–36.0)
MCV: 94.9 fL (ref 78.0–100.0)
Monocytes Absolute: 1 10*3/uL (ref 0.1–1.0)
Monocytes Relative: 12 %
NEUTROS PCT: 80 %
Neutro Abs: 6.6 10*3/uL (ref 1.7–7.7)
Platelets: 395 10*3/uL (ref 150–400)
RBC: 3.54 MIL/uL — AB (ref 3.87–5.11)
RDW: 13.3 % (ref 11.5–15.5)
WBC: 8.3 10*3/uL (ref 4.0–10.5)

## 2017-10-16 LAB — URINE CULTURE: Culture: NO GROWTH

## 2017-10-16 LAB — GLUCOSE, CAPILLARY
GLUCOSE-CAPILLARY: 142 mg/dL — AB (ref 65–99)
Glucose-Capillary: 109 mg/dL — ABNORMAL HIGH (ref 65–99)
Glucose-Capillary: 109 mg/dL — ABNORMAL HIGH (ref 65–99)
Glucose-Capillary: 148 mg/dL — ABNORMAL HIGH (ref 65–99)
Glucose-Capillary: 112 mg/dL — ABNORMAL HIGH (ref 65–99)
Glucose-Capillary: 122 mg/dL — ABNORMAL HIGH (ref 65–99)

## 2017-10-16 MED ORDER — PHENOL 1.4 % MT LIQD
1.0000 | OROMUCOSAL | Status: DC | PRN
  Administered 2017-10-16: 1 via OROMUCOSAL
  Filled 2017-10-16: qty 177

## 2017-10-16 NOTE — Progress Notes (Signed)
Patient ID: Marissa Ryan, female   DOB: Jan 08, 1925, 84 y.o.   MRN: [id number]  PROGRESS NOTE    Marissa Ryan  KGY:[id number] DOB: [birth year] DOA: 10/13/2017 PCP: Marissa Infante, MD    Brief Narrative: Marissa Ryan is a 84 y.o. female with a Past Medical History significant for htn, hld, OA, anxiety who presents with R abd pain. She has history of IBS as well as possible colitis on CT has anxiety disorder. Has acute on chronic low back pain.  Assessment & Plan:   Principal Problem:   Abdominal pain Active Problems:   Essential hypertension   Hyperlipidemia   Diabetes (HCC)   IBS (irritable bowel syndrome)   Anxiety   Anemia   Pressure injury of skin   1 RUQ abdominal pain: Still has pain but mostly gas. No BM. Suspect colitis.  Continue empiric antibiotics.  Continue clear liquid diet.  Advance diet if BM ensures and pain improves. Aggressive Bowel regimen due to being on Narcotics.  2.  Low back pain: Patient has chronic low back pain.  Continue home regimen.  On oxycodone and tramadol.  3 anxiety disorder: Continue Ativan  4 Diabetes: Appears to be controlled.  5. Hyperlipidemia: Continue statins  6.  Hypertension: Blood pressure is controlled now.  Continue management  7.  Hyponatremia: sodium slowly improved.  Continue fluids and close monitoring.     DVT prophylaxis: SCD  Code Status: Full  Family Communication: None available  Disposition Plan: Home  Consultants:   None  Procedures:  Antimicrobials: Unasyn day 2/7  Subjective: Patient still has right upper quadrant abdominal pain with distention. Exaggerated bowel sounds. She has passed gas but no BM.  Objective: Vitals:   10/15/17 2202 10/16/17 0557 10/16/17 0830 10/16/17 1405  BP: (!) 158/62 (!) 158/88 (!) 156/60 (!) 145/60  Pulse: 67 90 62 66  Resp: 16 17 18 18   Temp: (!) 97.5 F (36.4 C) 98.2 F (36.8 C) 98.6 F (37 C) 98.4 F (36.9 C)  TempSrc: Oral Oral Oral Oral  SpO2: 97% 99% 98% 99%    Weight:      Height:        Intake/Output Summary (Last 24 hours) at 10/16/2017 1422 Last data filed at 10/16/2017 1405 Gross per 24 hour  Intake 1070 ml  Output 300 ml  Net 770 ml   Filed Weights   10/14/17 1756 10/15/17 0532  Weight: 61.7 kg (136 lb 0.4 oz) 60.6 kg (133 lb 9.6 oz)    Examination:  General exam: Appears calm and comfortable  Respiratory system: Clear to auscultation. Respiratory effort normal. Cardiovascular system: S1 & S2 heard, RRR. No JVD, murmurs, rubs, gallops or clicks. No pedal edema. Gastrointestinal system: Abdomen isdistended, soft and mildly tender. No organomegaly or masses felt. Normal bowel sounds heard. Central nervous system: Alert and oriented. No focal neurological deficits. Extremities: Symmetric 5 x 5 power. Skin: No rashes, lesions or ulcers Psychiatry: Judgement and insight appear normal. Mood & affect appropriate.     Data Reviewed: I have personally reviewed following labs and imaging studies  CBC: Recent Labs  Lab 10/14/17 0015 10/14/17 0613 10/14/17 1529 10/15/17 0640 10/16/17 0912  WBC 12.0* 8.1 11.6* 9.2 8.3  NEUTROABS 9.4*  --   --   --  6.6  HGB 10.9* 9.9* 10.9* 10.6* 10.7*  HCT 33.9* 30.1* 33.5* 33.3* 33.6*  MCV 93.4 93.8 93.3 94.3 94.9  PLT 447* 397 440* 435* 026   Basic Metabolic Panel: Recent Labs  Lab 10/14/17 0015 10/14/17 1529 10/15/17 0640 10/16/17 0912  NA 130* 131* 136 135  K 3.7 3.5 3.1* 3.1*  CL 98* 102 107 106  CO2 19* 20* 21* 22  GLUCOSE 119* 89 129* 125*  BUN 8 5* <5* <5*  CREATININE 0.64 0.59 0.60 0.49  CALCIUM 9.3 8.5* 8.2* 8.2*   GFR: Estimated Creatinine Clearance: 39 mL/min (by C-G formula based on SCr of 0.49 mg/dL). Liver Function Tests: Recent Labs  Lab 10/14/17 0015 10/16/17 0912  AST 32 26  ALT 23 22  ALKPHOS 58 62  BILITOT 0.6 0.2*  PROT 6.2* 5.6*  ALBUMIN 3.5 3.0*   Recent Labs  Lab 10/14/17 0015  LIPASE 38   No results for input(s): AMMONIA in the last 168  hours. Coagulation Profile: No results for input(s): INR, PROTIME in the last 168 hours. Cardiac Enzymes: Recent Labs  Lab 10/14/17 0015 10/14/17 0722 10/14/17 1529 10/14/17 2331  TROPONINI <0.03 <0.03 <0.03 <0.03   BNP (last 3 results) No results for input(s): PROBNP in the last 8760 hours. HbA1C: Recent Labs    10/14/17 0717  HGBA1C 6.7*   CBG: Recent Labs  Lab 10/15/17 2002 10/16/17 0130 10/16/17 0409 10/16/17 0809 10/16/17 1212  GLUCAP 200* 109* 109* 142* 148*   Lipid Profile: No results for input(s): CHOL, HDL, LDLCALC, TRIG, CHOLHDL, LDLDIRECT in the last 72 hours. Thyroid Function Tests: No results for input(s): TSH, T4TOTAL, FREET4, T3FREE, THYROIDAB in the last 72 hours. Anemia Panel: No results for input(s): VITAMINB12, FOLATE, FERRITIN, TIBC, IRON, RETICCTPCT in the last 72 hours. Urine analysis:    Component Value Date/Time   COLORURINE STRAW (A) 10/14/2017 0722   APPEARANCEUR CLEAR 10/14/2017 0722   LABSPEC 1.006 10/14/2017 0722   PHURINE 7.0 10/14/2017 0722   GLUCOSEU NEGATIVE 10/14/2017 0722   HGBUR SMALL (A) 10/14/2017 0722   BILIRUBINUR NEGATIVE 10/14/2017 0722   KETONESUR 20 (A) 10/14/2017 0722   PROTEINUR NEGATIVE 10/14/2017 0722   UROBILINOGEN 0.2 10/12/2014 0315   NITRITE NEGATIVE 10/14/2017 0722   LEUKOCYTESUR NEGATIVE 10/14/2017 0722   Sepsis Labs: @LABRCNTIP (procalcitonin:4,lacticidven:4)  ) Recent Results (from the past 240 hour(s))  Urine culture     Status: None   Collection Time: 10/14/17  9:20 PM  Result Value Ref Range Status   Specimen Description URINE, CLEAN CATCH  Final   Special Requests NONE  Final   Culture   Final    NO GROWTH Performed at [facility], [place] [address], [place], [place] [address]    Report Status 10/16/2017 FINAL  Final         Radiology Studies: No results found.      Scheduled Meds: . ALPRAZolam  0.25 mg Oral QHS  . amLODipine  5 mg Oral Daily  . ezetimibe-simvastatin  0.5  tablet Oral QHS  . insulin aspart  0-9 Units Subcutaneous TID WC  . losartan  50 mg Oral Q supper  . prednisoLONE acetate  1 drop Right Eye BID  . sertraline  50 mg Oral Daily   Continuous Infusions: . sodium chloride 100 mL/hr at 10/15/17 2306  . ampicillin-sulbactam (UNASYN) IV Stopped (10/16/17 1122)     LOS: 2 days    Time spent: 13 minutes   Colbi Staubs,LAWAL, MD [facility] Pager [phone]  If 7PM-7AM, please contact night-coverage [email or web address] Password [email or web address] 10/16/2017, 2:22 PM

## 2017-10-17 DIAGNOSIS — E0800 Diabetes mellitus due to underlying condition with hyperosmolarity without nonketotic hyperglycemic-hyperosmolar coma (NKHHC): Secondary | ICD-10-CM

## 2017-10-17 DIAGNOSIS — E782 Mixed hyperlipidemia: Secondary | ICD-10-CM

## 2017-10-17 LAB — COMPREHENSIVE METABOLIC PANEL
ALT: 22 U/L (ref 14–54)
AST: 29 U/L (ref 15–41)
Albumin: 3 g/dL — ABNORMAL LOW (ref 3.5–5.0)
Alkaline Phosphatase: 56 U/L (ref 38–126)
Anion gap: 12 (ref 5–15)
BILIRUBIN TOTAL: 0.4 mg/dL (ref 0.3–1.2)
BUN: <5 mg/dL — ABNORMAL LOW (ref 6–20)
CHLORIDE: 102 mmol/L (ref 101–111)
CO2: 17 mmol/L — ABNORMAL LOW (ref 22–32)
CREATININE: 0.61 mg/dL (ref 0.44–1.00)
Calcium: 7.8 mg/dL — ABNORMAL LOW (ref 8.9–10.3)
GFR calc non Af Amer: >60 mL/min (ref >60)
Glucose, Bld: 171 mg/dL — ABNORMAL HIGH (ref 65–99)
Potassium: 2.8 mmol/L — ABNORMAL LOW (ref 3.5–5.1)
Sodium: 131 mmol/L — ABNORMAL LOW (ref 135–145)
TOTAL PROTEIN: 5.3 g/dL — AB (ref 6.5–8.1)

## 2017-10-17 LAB — GASTROINTESTINAL PANEL BY PCR, STOOL (REPLACES STOOL CULTURE)
Adenovirus F40/41: NOT DETECTED
Astrovirus: NOT DETECTED
CAMPYLOBACTER SPECIES: NOT DETECTED
CRYPTOSPORIDIUM: NOT DETECTED
CYCLOSPORA CAYETANENSIS: NOT DETECTED
ENTAMOEBA HISTOLYTICA: NOT DETECTED
Enteroaggregative E coli (EAEC): NOT DETECTED
Enteropathogenic E coli (EPEC): NOT DETECTED
Enterotoxigenic E coli (ETEC): NOT DETECTED
Giardia lamblia: NOT DETECTED
Norovirus GI/GII: NOT DETECTED
PLESIMONAS SHIGELLOIDES: NOT DETECTED
Rotavirus A: NOT DETECTED
SALMONELLA SPECIES: NOT DETECTED
SAPOVIRUS (I, II, IV, AND V): NOT DETECTED
SHIGELLA/ENTEROINVASIVE E COLI (EIEC): NOT DETECTED
Shiga like toxin producing E coli (STEC): NOT DETECTED
VIBRIO CHOLERAE: NOT DETECTED
VIBRIO SPECIES: NOT DETECTED
Yersinia enterocolitica: NOT DETECTED

## 2017-10-17 LAB — CBC WITH DIFFERENTIAL/PLATELET
Basophils Absolute: 0 10*3/uL (ref 0.0–0.1)
Basophils Relative: 0 %
EOS PCT: 0 %
Eosinophils Absolute: 0 10*3/uL (ref 0.0–0.7)
HEMATOCRIT: 33.1 % — AB (ref 36.0–46.0)
Hemoglobin: 10.9 g/dL — ABNORMAL LOW (ref 12.0–15.0)
LYMPHS ABS: 0.5 10*3/uL — AB (ref 0.7–4.0)
LYMPHS PCT: 8 %
MCH: 31.3 pg (ref 26.0–34.0)
MCHC: 32.9 g/dL (ref 30.0–36.0)
MCV: 95.1 fL (ref 78.0–100.0)
MONO ABS: 1.1 10*3/uL — AB (ref 0.1–1.0)
Monocytes Relative: 20 %
Neutro Abs: 4 10*3/uL (ref 1.7–7.7)
Neutrophils Relative %: 72 %
PLATELETS: 329 10*3/uL (ref 150–400)
RBC: 3.48 MIL/uL — AB (ref 3.87–5.11)
RDW: 13.7 % (ref 11.5–15.5)
WBC: 5.6 10*3/uL (ref 4.0–10.5)

## 2017-10-17 LAB — GLUCOSE, CAPILLARY
GLUCOSE-CAPILLARY: 109 mg/dL — AB (ref 65–99)
GLUCOSE-CAPILLARY: 141 mg/dL — AB (ref 65–99)
GLUCOSE-CAPILLARY: 86 mg/dL (ref 65–99)
Glucose-Capillary: 103 mg/dL — ABNORMAL HIGH (ref 65–99)
Glucose-Capillary: 124 mg/dL — ABNORMAL HIGH (ref 65–99)
Glucose-Capillary: 124 mg/dL — ABNORMAL HIGH (ref 65–99)

## 2017-10-17 LAB — OCCULT BLOOD X 1 CARD TO LAB, STOOL: Fecal Occult Bld: NEGATIVE

## 2017-10-17 NOTE — Evaluation (Signed)
Occupational Therapy Evaluation Patient Details Name: Marissa Ryan MRN: [id number] DOB: August 13, 1924 Today's Date: 10/17/2017    History of Present Illness Marissa Ryan is a 84 y.o. female with a Past Medical History significant for htn, hld, OA, anxiety who presents with R abd pain.  Possibly colitis and GIB.    Clinical Impression   Pt presents to OT session slightly lethargic with difficulty problem solving and reporting her living situation. Per chart, she was independent with rollator for ADL and functional mobility. She does report that she does not shower but performs sponge baths. Pt also with recent L wrist sprain and wearing splint during session. OT assisted pt to adjust splint for correct positioning. She requires min assist for toilet transfers and min guard assist for LB ADL at this time. She presents with decreased problem solving and skills and safety awareness. Pt would benefit from short-term SNF level rehabilitation post-acute D/C to maximize return to PLOF. Will continue to follow while admitted.    Follow Up Recommendations  SNF;Supervision/Assistance - 24 hour    Equipment Recommendations  Other (comment)(defer to next venue of care)    Recommendations for Other Services       Precautions / Restrictions Precautions Precautions: Fall Precaution Comments: left wrist brace due to recent sprain per pt Restrictions Weight Bearing Restrictions: No      Mobility Bed Mobility               General bed mobility comments: OOB in chair on my arrival.   Transfers Overall transfer level: Needs assistance Equipment used: Rolling walker (2 wheeled) Transfers: Sit to/from Stand Sit to Stand: Min assist         General transfer comment: Attempts to pull up on RW. Min assist to power up with min guard once ambulating.     Balance Overall balance assessment: Needs assistance;History of Falls Sitting-balance support: No upper extremity supported;Feet  supported Sitting balance-Leahy Scale: Good     Standing balance support: Bilateral upper extremity supported;During functional activity Standing balance-Leahy Scale: Poor Standing balance comment: Relies on UE support.                            ADL either performed or assessed with clinical judgement   ADL Overall ADL's : Needs assistance/impaired Eating/Feeding: Set up;Sitting   Grooming: Minimal assistance;Sitting   Upper Body Bathing: Sitting;Supervision/ safety   Lower Body Bathing: Min guard;Sit to/from stand   Upper Body Dressing : Supervision/safety;Sitting   Lower Body Dressing: Min guard;Sit to/from stand   Toilet Transfer: Minimal assistance;Ambulation;Regular Toilet;Grab bars Toilet Transfer Details (indicate cue type and reason): Min assist to power up and min guard assist for ambulation with RW.  Toileting- Clothing Manipulation and Hygiene: Supervision/safety;Sitting/lateral lean       Functional mobility during ADLs: Minimal assistance;Rolling walker General ADL Comments: Pt noted to be lethargic with difficulty problem solving this session. '     Vision Patient Visual Report: No change from baseline Additional Comments: Will continue assessment. No deficits noted.      Perception     Praxis      Pertinent Vitals/Pain Pain Assessment: Faces Faces Pain Scale: Hurts little more Pain Location: R upper leg nerve pain     Hand Dominance Left   Extremity/Trunk Assessment Upper Extremity Assessment Upper Extremity Assessment: LUE deficits/detail LUE Deficits / Details: Recent wrist sprain. Wearing splint on wrist but on incorrectly (OT adjusted). Limited L wrist AROM.  Lower Extremity Assessment Lower Extremity Assessment: Generalized weakness   Cervical / Trunk Assessment Cervical / Trunk Assessment: Kyphotic   Communication Communication Communication: HOH   Cognition Arousal/Alertness: Lethargic Behavior During Therapy: WFL  for tasks assessed/performed Overall Cognitive Status: Impaired/Different from baseline Area of Impairment: Problem solving;Safety/judgement                         Safety/Judgement: Decreased awareness of safety;Decreased awareness of deficits   Problem Solving: Slow processing General Comments: Difficulty answering questions concerning home set up and living arrangements stating "well, I don't think I was living with anyone." Difficulty problem solving to utilize hospital room phone.    General Comments       Exercises     Shoulder Instructions      Home Living Family/patient expects to be discharged to:: Private residence Living Arrangements: Other relatives;Non-relatives/Friends Available Help at Discharge: Family;Available PRN/intermittently Type of Home: House Home Access: Stairs to enter [employer] of Steps: 2 Entrance Stairs-Rails: Right Home Layout: One level     Bathroom Shower/Tub: Walk-in shower(sponge bathes)   Bathroom Toilet: Standard     Home Equipment: Environmental consultant - 2 wheels;Walker - 4 wheels;Cane - single point;Bedside commode;Shower seat;Hand held shower head;Grab bars - tub/shower   Additional Comments: Information concerning home from PT note as pt unable to answer questions this session due to confusion/lethargy. Husband died in 07-20-2023.  She has recently begun using rollator per pt that she borrowed.       Prior Functioning/Environment Level of Independence: Independent with assistive device(s)        Comments: has had a recent fall        OT Problem List: Decreased strength;Decreased activity tolerance;Impaired balance (sitting and/or standing);Decreased safety awareness;Decreased cognition;Decreased knowledge of use of DME or AE;Decreased knowledge of precautions;Pain      OT Treatment/Interventions: Self-care/ADL training;Therapeutic exercise;Energy conservation;DME and/or AE instruction;Patient/family education;Balance  training;Cognitive remediation/compensation;Therapeutic activities    OT Goals(Current goals can be found in the care plan section) Acute Rehab OT Goals Patient Stated Goal: to get better OT Goal Formulation: With patient Time For Goal Achievement: 10/31/17 Potential to Achieve Goals: Good ADL Goals Pt Will Perform Grooming: with modified independence;standing Pt Will Perform Upper Body Dressing: with modified independence;sitting Pt Will Perform Lower Body Dressing: with modified independence;sit to/from stand Pt Will Transfer to Toilet: with modified independence;ambulating;regular height toilet Pt Will Perform Toileting - Clothing Manipulation and hygiene: with modified independence;sit to/from stand  OT Frequency: Min 1X/week   Barriers to D/C:            Co-evaluation              AM-PAC PT "6 Clicks" Daily Activity     Outcome Measure Help from another person eating meals?: None Help from another person taking care of personal grooming?: A Little Help from another person toileting, which includes using toliet, bedpan, or urinal?: A Little Help from another person bathing (including washing, rinsing, drying)?: A Little Help from another person to put on and taking off regular upper body clothing?: A Little Help from another person to put on and taking off regular lower body clothing?: A Little 6 Click Score: 19   End of Session Equipment Utilized During Treatment: Rolling walker  Activity Tolerance: Patient tolerated treatment well Patient left: in chair;with call bell/phone within reach;with nursing/sitter in room  OT Visit Diagnosis: Other abnormalities of gait and mobility (R26.89);Other symptoms and signs involving cognitive function  Time: 1130-1143 OT Time Calculation (min): 13 min Charges:  OT General Charges $OT Visit: 1 Visit OT Evaluation $OT Eval Low Complexity: 1 Low G-Codes:     Norman Herrlich, MS OTR/L  Pager:  Jay A Yohanna Tow 10/17/2017, 1:55 PM

## 2017-10-17 NOTE — Progress Notes (Signed)
PT Cancellation Note  Patient Details Name: Marissa Ryan MRN: [id number] DOB: July 23, 1924   Cancelled Treatment:    Reason Eval/Treat Not Completed: Pain limiting ability to participate. Checked on pt this morning in attempt to see pt after OT session. Pt too fatigued to participate and asked that PT come back later. Upon second attempt, pt reports her pain is too high to tolerate PT. She has questions regarding her gabapentin dosage and the effects on her pain. RN notified. Will check back tomorrow to continue with PT POC.    Thelma Comp 10/17/2017, 1:52 PM   Rolinda Roan, PT, DPT Acute Rehabilitation Services Pager: [phone]

## 2017-10-17 NOTE — Progress Notes (Signed)
Pharmacy Antibiotic Note  Marissa Ryan is a 84 y.o. female admitted on 10/13/2017 with intra-abdominal infection/ colitis.  Pharmacy has been consulted for unasyn dosing. Noted reactions documented for penicillin and ancef. Allergies discussed with admitting provider and she believes this regimen is better option than cipro/flagyl in this elderly patient.  Plan: Continue Unasyn 1.5g IV q6 (Day 4) Monitor for itching and rash.  Deescalate to PO when clinically appropriate.   Height: 5\' 1"  (154.9 cm) Weight: 133 lb 9.6 oz (60.6 kg) IBW/kg (Calculated) : 47.8  Temp (24hrs), Avg:98.3 F (36.8 C), Min:98.1 F (36.7 C), Max:98.4 F (36.9 C)  Recent Labs  Lab 10/14/17 0015 10/14/17 0438 10/14/17 0613 10/14/17 1529 10/15/17 0640 10/16/17 0912  WBC 12.0*  --  8.1 11.6* 9.2 8.3  CREATININE 0.64  --   --  0.59 0.60 0.49  LATICACIDVEN  --  0.82  --   --   --   --     Estimated Creatinine Clearance: 39 mL/min (by C-G formula based on SCr of 0.49 mg/dL).    Allergies  Allergen Reactions  . Robaxin [Methocarbamol] Rash    Red rash on right side of face and forehead cleared after benadryl given ( per [name] )  . Ancef [Cefazolin] Diarrhea  . Morphine And Related Itching  . Penicillins Itching    Tolerated Unasyn  . Flurbiprofen Diarrhea    Thank you for allowing pharmacy to be a part of this patient's care.  Libby Pharmacist Pager: [phone] 10/17/2017 8:46 AM

## 2017-10-17 NOTE — [place] FL2 (Signed)
[place] MEDICAID FL2 LEVEL OF CARE SCREENING TOOL     IDENTIFICATION  Patient Name: Marissa Ryan Birthdate: [birth year] Sex: female Admission Date (Current Location): 10/13/2017  [facility] and [place] Number:  Herbalist and Address:  [facility], [place] [address], [place], [place] [address]      Provider Number: [id number]  Attending Physician Name and Address:  Elwyn Reach, MD  Relative Name and Phone Number:       Current Level of Care: Hospital Recommended Level of Care: [place] Prior Approval Number:    Date Approved/Denied:   PASRR Number: [id number]  Discharge Plan: SNF    Current Diagnoses: Patient Active Problem List   Diagnosis Date Noted  . Pressure injury of skin 10/15/2017  . Abdominal pain 10/14/2017  . IBS (irritable bowel syndrome) 10/14/2017  . Anxiety 10/14/2017  . Anemia 10/14/2017  . 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 OA (osteoarthritis) of hip 02/20/2014  . Essential hypertension 12/03/2013  . Hyperlipidemia 12/03/2013  . Diabetes ([place]) 12/03/2013    Orientation RESPIRATION BLADDER Height & Weight     Self, Time, Situation, Place  Normal Continent Weight: 133 lb 9.6 oz (60.6 kg) Height:  5\' 1"  (154.9 cm)  BEHAVIORAL SYMPTOMS/MOOD NEUROLOGICAL BOWEL NUTRITION STATUS      Continent Diet(Clear liquid, thin liquids)  AMBULATORY STATUS COMMUNICATION OF NEEDS Skin   Limited Assist Verbally PU Stage and Appropriate Care PU Stage 1 Dressing: (Coccyx, foam dressing)                     Personal Care Assistance Level of Assistance  Bathing, Feeding, Dressing Bathing Assistance: Limited assistance Feeding assistance: Independent Dressing Assistance: Limited assistance     Functional Limitations Info  Sight, Hearing, Speech Sight Info: Adequate Hearing Info: Adequate Speech Info: Adequate    SPECIAL CARE FACTORS FREQUENCY  PT (By licensed PT), OT (By licensed  OT)     PT Frequency: 3x OT Frequency: 3x            Contractures Contractures Info: Not present    Additional Factors Info  Code Status, Allergies Code Status Info: Full Code Allergies Info: Robaxin Methocarbamol, Ancef Cefazolin, Morphine And Related, Penicillins, Flurbiprofen           Current Medications (10/17/2017):  This is the current hospital active medication list Current Facility-Administered Medications  Medication Dose Route Frequency Provider Last Rate Last Dose  . 0.9 %  sodium chloride infusion   Intravenous Continuous Bodenheimer, Charles A, NP 100 mL/hr at 10/17/17 0250    . acetaminophen (TYLENOL) tablet 650 mg  650 mg Oral Q6H PRN Rondel Jumbo, PA-C   650 mg at 10/16/17 4944   Or  . acetaminophen (TYLENOL) suppository 650 mg  650 mg Rectal Q6H PRN Rondel Jumbo, PA-C      . ALPRAZolam Duanne Moron) tablet 0.25 mg  0.25 mg Oral QHS Rondel Jumbo, PA-C   0.25 mg at 10/16/17 2117  . amLODipine (NORVASC) tablet 5 mg  5 mg Oral Daily Rondel Jumbo, PA-C   5 mg at 10/17/17 0854  . ampicillin-sulbactam (UNASYN) 1.5 g in sodium chloride 0.9 % 100 mL IVPB  1.5 g Intravenous Q6H Dawayne Cirri, RPH   Stopped at 10/17/17 9675  . bisacodyl (DULCOLAX) suppository 10 mg  10 mg Rectal Daily PRN Sharene Butters E, PA-C      . ezetimibe-simvastatin (VYTORIN) 10-40 MG per tablet 0.5  tablet  0.5 tablet Oral QHS Rondel Jumbo, PA-C   0.5 tablet at 10/16/17 2118  . fentaNYL (SUBLIMAZE) injection 50 mcg  50 mcg Intravenous Q2H PRN Varney Biles, MD   50 mcg at 10/14/17 0357  . gabapentin (NEURONTIN) capsule 300 mg  300 mg Oral TID PRN Sharene Butters E, PA-C   300 mg at 10/17/17 1031  . insulin aspart (novoLOG) injection 0-9 Units  0-9 Units Subcutaneous TID WC Rondel Jumbo, PA-C   1 Units at 10/17/17 1235  . losartan (COZAAR) tablet 50 mg  50 mg Oral Q supper Rondel Jumbo, PA-C   50 mg at 10/16/17 1717  . MUSCLE RUB CREA   Topical PRN Elwin Mocha, MD      .  ondansetron [facility]) tablet 4 mg  4 mg Oral Q6H PRN Rondel Jumbo, PA-C       Or  . ondansetron [facility]) injection 4 mg  4 mg Intravenous Q6H PRN Rondel Jumbo, PA-C      . oxyCODONE-acetaminophen (PERCOCET/ROXICET) 5-325 MG per tablet 1 tablet  1 tablet Oral Q6H PRN Elwin Mocha, MD   1 tablet at 10/17/17 0522   And  . oxyCODONE (Oxy IR/ROXICODONE) immediate release tablet 5 mg  5 mg Oral Q6H PRN Elwin Mocha, MD   5 mg at 10/17/17 0522  . phenol (CHLORASEPTIC) mouth spray 1 spray  1 spray Mouth/Throat PRN Elwyn Reach, MD   1 spray at 10/16/17 2119  . polyvinyl alcohol (LIQUIFILM TEARS) 1.4 % ophthalmic solution 1 drop  1 drop Both Eyes Q1H PRN Elwyn Reach, MD   1 drop at 10/16/17 2025  . prednisoLONE acetate (PRED FORTE) 1 % ophthalmic suspension 1 drop  1 drop Right Eye BID Elwyn Reach, MD   1 drop at 10/17/17 0902  . senna-docusate (Senokot-S) tablet 1 tablet  1 tablet Oral QHS PRN Rondel Jumbo, PA-C      . sertraline (ZOLOFT) tablet 50 mg  50 mg Oral Daily Elwyn Reach, MD   50 mg at 10/17/17 0902  . traMADol (ULTRAM) tablet 50 mg  50 mg Oral Q6H PRN Rondel Jumbo, PA-C   50 mg at 10/16/17 2118     Discharge Medications: Please see discharge summary for a list of discharge medications.  Relevant Imaging Results:  Relevant Lab Results:   Additional Information SSN: [id number]  Eileen Stanford, LCSW

## 2017-10-17 NOTE — Progress Notes (Signed)
Patient ID: Marissa Ryan, female   DOB: 05-08-1925, 84 y.o.   MRN: [id number]  PROGRESS NOTE    Marissa Ryan  EYC:[id number] DOB: 06-07-1925 DOA: 10/13/2017 PCP: Crist Infante, MD    Brief Narrative: Marissa Ryan is a 84 y.o. female with a Past Medical History significant for htn, hld, OA, anxiety who presents with R abd pain. She has history of IBS as well as possible colitis on CT has anxiety disorder. Has acute on chronic low back pain.  Assessment & Plan:   Principal Problem:   Abdominal pain Active Problems:   Essential hypertension   Hyperlipidemia   Diabetes (HCC)   IBS (irritable bowel syndrome)   Anxiety   Anemia   Pressure injury of skin   1 RUQ abdominal pain: Much improved. Has had BM this morning. Tolerating clear liquid diet. Will advance diet. Continue antibiotics.  2.  Low back pain: Patient has chronic low back pain.  Continue home regimen.  On oxycodone and tramadol.  3 anxiety disorder: Less anxious today. Continue Ativan  4 Diabetes: On SSI. Appears to be controlled.  5. Hyperlipidemia: Continue statins  6.  Hypertension: Blood pressure is controlled now.  Continue management  7.  Hyponatremia: Due to Dehydration. Continue fluids and close monitoring.     DVT prophylaxis: SCD  Code Status: Full  Family Communication: None available  Disposition Plan: Home  Consultants:   None  Procedures:  Antimicrobials: Unasyn day 2/7  Subjective: Patient still has right upper quadrant abdominal pain with distention. Exaggerated bowel sounds. She has passed gas but no BM.  Objective: Vitals:   10/16/17 2145 10/17/17 0518 10/17/17 1446 10/17/17 2027  BP: (!) 158/60 (!) 159/68 (!) 135/55 (!) 158/65  Pulse: 68 91 80 78  Resp: 17 19 18 18   Temp: 98.1 F (36.7 C) 98.4 F (36.9 C) 98.4 F (36.9 C) 98.4 F (36.9 C)  TempSrc: Oral Oral Oral Oral  SpO2: 95% 94% 98% 97%  Weight:      Height:        Intake/Output Summary (Last 24 hours) at 10/17/2017  2117 Last data filed at 10/17/2017 1300 Gross per 24 hour  Intake 3533 ml  Output -  Net 3533 ml   Filed Weights   10/14/17 1756 10/15/17 0532  Weight: 61.7 kg (136 lb 0.4 oz) 60.6 kg (133 lb 9.6 oz)    Examination:  General exam: Appears calm and comfortable  Respiratory system: Clear to auscultation. Respiratory effort normal. Cardiovascular system: S1 & S2 heard, RRR. No JVD, murmurs, rubs, gallops or clicks. No pedal edema. Gastrointestinal system: Abdomen isdistended, soft and mildly tender. No organomegaly or masses felt. Normal bowel sounds heard. Central nervous system: Alert and oriented. No focal neurological deficits. Extremities: Symmetric 5 x 5 power. Skin: No rashes, lesions or ulcers Psychiatry: Judgement and insight appear normal. Mood & affect appropriate.     Data Reviewed: I have personally reviewed following labs and imaging studies  CBC: Recent Labs  Lab 10/14/17 0015 10/14/17 0613 10/14/17 1529 10/15/17 0640 10/16/17 0912 10/17/17 1023  WBC 12.0* 8.1 11.6* 9.2 8.3 5.6  NEUTROABS 9.4*  --   --   --  6.6 4.0  HGB 10.9* 9.9* 10.9* 10.6* 10.7* 10.9*  HCT 33.9* 30.1* 33.5* 33.3* 33.6* 33.1*  MCV 93.4 93.8 93.3 94.3 94.9 95.1  PLT 447* 397 440* 435* 395 149   Basic Metabolic Panel: Recent Labs  Lab 10/14/17 0015 10/14/17 1529 10/15/17 0640 10/16/17 0912 10/17/17 1023  NA 130* 131* 136 135 131*  K 3.7 3.5 3.1* 3.1* 2.8*  CL 98* 102 107 106 102  CO2 19* 20* 21* 22 17*  GLUCOSE 119* 89 129* 125* 171*  BUN 8 5* <5* <5* <5*  CREATININE 0.64 0.59 0.60 0.49 0.61  CALCIUM 9.3 8.5* 8.2* 8.2* 7.8*   GFR: Estimated Creatinine Clearance: 39 mL/min (by C-G formula based on SCr of 0.61 mg/dL). Liver Function Tests: Recent Labs  Lab 10/14/17 0015 10/16/17 0912 10/17/17 1023  AST 32 26 29  ALT 23 22 22   ALKPHOS 58 62 56  BILITOT 0.6 0.2* 0.4  PROT 6.2* 5.6* 5.3*  ALBUMIN 3.5 3.0* 3.0*   Recent Labs  Lab 10/14/17 0015  LIPASE 38   No  results for input(s): AMMONIA in the last 168 hours. Coagulation Profile: No results for input(s): INR, PROTIME in the last 168 hours. Cardiac Enzymes: Recent Labs  Lab 10/14/17 0015 10/14/17 0722 10/14/17 1529 10/14/17 2331  TROPONINI <0.03 <0.03 <0.03 <0.03   BNP (last 3 results) No results for input(s): PROBNP in the last 8760 hours. HbA1C: No results for input(s): HGBA1C in the last 72 hours. CBG: Recent Labs  Lab 10/17/17 0429 10/17/17 0747 10/17/17 1232 10/17/17 1639 10/17/17 2024  GLUCAP 141* 103* 124* 124* 109*   Lipid Profile: No results for input(s): CHOL, HDL, LDLCALC, TRIG, CHOLHDL, LDLDIRECT in the last 72 hours. Thyroid Function Tests: No results for input(s): TSH, T4TOTAL, FREET4, T3FREE, THYROIDAB in the last 72 hours. Anemia Panel: No results for input(s): VITAMINB12, FOLATE, FERRITIN, TIBC, IRON, RETICCTPCT in the last 72 hours. Urine analysis:    Component Value Date/Time   COLORURINE STRAW (A) 10/14/2017 0722   APPEARANCEUR CLEAR 10/14/2017 0722   LABSPEC 1.006 10/14/2017 0722   PHURINE 7.0 10/14/2017 0722   GLUCOSEU NEGATIVE 10/14/2017 0722   HGBUR SMALL (A) 10/14/2017 0722   BILIRUBINUR NEGATIVE 10/14/2017 0722   KETONESUR 20 (A) 10/14/2017 0722   PROTEINUR NEGATIVE 10/14/2017 0722   UROBILINOGEN 0.2 10/12/2014 0315   NITRITE NEGATIVE 10/14/2017 0722   LEUKOCYTESUR NEGATIVE 10/14/2017 0722   Sepsis Labs: @LABRCNTIP (procalcitonin:4,lacticidven:4)  ) Recent Results (from the past 240 hour(s))  Urine culture     Status: None   Collection Time: 10/14/17  9:20 PM  Result Value Ref Range Status   Specimen Description URINE, CLEAN CATCH  Final   Special Requests NONE  Final   Culture   Final    NO GROWTH Performed at [facility], [place] [address] [place], [place] [address]    Report Status 10/16/2017 FINAL  Final  Gastrointestinal Panel by PCR , Stool     Status: None   Collection Time: 10/17/17 11:50 AM  Result Value Ref Range  Status   Campylobacter species NOT DETECTED NOT DETECTED Final   Plesimonas shigelloides NOT DETECTED NOT DETECTED Final   Salmonella species NOT DETECTED NOT DETECTED Final   Yersinia enterocolitica NOT DETECTED NOT DETECTED Final   Vibrio species NOT DETECTED NOT DETECTED Final   Vibrio cholerae NOT DETECTED NOT DETECTED Final   Enteroaggregative E coli (EAEC) NOT DETECTED NOT DETECTED Final   Enteropathogenic E coli (EPEC) NOT DETECTED NOT DETECTED Final   Enterotoxigenic E coli (ETEC) NOT DETECTED NOT DETECTED Final   Shiga like toxin producing E coli (STEC) NOT DETECTED NOT DETECTED Final   Shigella/Enteroinvasive E coli (EIEC) NOT DETECTED NOT DETECTED Final   Cryptosporidium NOT DETECTED NOT DETECTED Final   Cyclospora cayetanensis NOT DETECTED NOT DETECTED Final  Entamoeba histolytica NOT DETECTED NOT DETECTED Final   Giardia lamblia NOT DETECTED NOT DETECTED Final   Adenovirus F40/41 NOT DETECTED NOT DETECTED Final   Astrovirus NOT DETECTED NOT DETECTED Final   Norovirus GI/GII NOT DETECTED NOT DETECTED Final   Rotavirus A NOT DETECTED NOT DETECTED Final   Sapovirus (I, II, IV, and V) NOT DETECTED NOT DETECTED Final    Comment: Performed at [facility], [address], [place], [place] [address]         Radiology Studies: No results found.      Scheduled Meds: . ALPRAZolam  0.25 mg Oral QHS  . amLODipine  5 mg Oral Daily  . ezetimibe-simvastatin  0.5 tablet Oral QHS  . insulin aspart  0-9 Units Subcutaneous TID WC  . losartan  50 mg Oral Q supper  . prednisoLONE acetate  1 drop Right Eye BID  . sertraline  50 mg Oral Daily   Continuous Infusions: . sodium chloride 100 mL/hr at 10/17/17 1630  . ampicillin-sulbactam (UNASYN) IV Stopped (10/17/17 2011)     LOS: 3 days    Time spent: 29 minutes   Tremaine Fuhriman,LAWAL, MD [facility] Pager [phone]  If 7PM-7AM, please contact night-coverage [email or web address] Password [facility] 10/17/2017, 9:17 PM

## 2017-10-18 LAB — MAGNESIUM: Magnesium: 1.3 mg/dL — ABNORMAL LOW (ref 1.7–2.4)

## 2017-10-18 LAB — BASIC METABOLIC PANEL
Anion gap: 10 (ref 5–15)
CHLORIDE: 99 mmol/L — AB (ref 101–111)
CO2: 20 mmol/L — AB (ref 22–32)
Calcium: 7.8 mg/dL — ABNORMAL LOW (ref 8.9–10.3)
Creatinine, Ser: 0.63 mg/dL (ref 0.44–1.00)
GFR calc Af Amer: >60 mL/min (ref >60)
GFR calc non Af Amer: >60 mL/min (ref >60)
Glucose, Bld: 196 mg/dL — ABNORMAL HIGH (ref 65–99)
POTASSIUM: 2.3 mmol/L — AB (ref 3.5–5.1)
Sodium: 129 mmol/L — ABNORMAL LOW (ref 135–145)

## 2017-10-18 LAB — GLUCOSE, CAPILLARY
GLUCOSE-CAPILLARY: 90 mg/dL (ref 65–99)
GLUCOSE-CAPILLARY: 114 mg/dL — AB (ref 65–99)
GLUCOSE-CAPILLARY: 134 mg/dL — AB (ref 65–99)
GLUCOSE-CAPILLARY: 97 mg/dL (ref 65–99)
Glucose-Capillary: 89 mg/dL (ref 65–99)
Glucose-Capillary: 90 mg/dL (ref 65–99)

## 2017-10-18 MED ORDER — POTASSIUM CHLORIDE CRYS ER 20 MEQ PO TBCR
20.0000 meq | EXTENDED_RELEASE_TABLET | Freq: Once | ORAL | Status: AC
Start: 2017-10-18 — End: 2017-10-18
  Administered 2017-10-18: 20 meq via ORAL
  Filled 2017-10-18: qty 1

## 2017-10-18 MED ORDER — POTASSIUM CHLORIDE CRYS ER 20 MEQ PO TBCR
40.0000 meq | EXTENDED_RELEASE_TABLET | ORAL | Status: AC
  Administered 2017-10-18 (×2): 40 meq via ORAL
  Filled 2017-10-18 (×2): qty 2

## 2017-10-18 MED ORDER — CLINDAMYCIN HCL 150 MG PO CAPS: 150.0000 mg | ORAL_CAPSULE | Freq: Four times a day (QID) | ORAL | 0 refills | Status: AC

## 2017-10-18 MED ORDER — POTASSIUM CHLORIDE CRYS ER 20 MEQ PO TBCR
40.0000 meq | EXTENDED_RELEASE_TABLET | Freq: Two times a day (BID) | ORAL | Status: DC
  Administered 2017-10-18: 40 meq via ORAL
  Filled 2017-10-18: qty 2

## 2017-10-18 NOTE — Clinical Social Work Note (Signed)
Clinical Social Work Assessment  Patient Details  Name: Marissa Ryan MRN: [id number] Date of Birth: 03-21-1925  Date of referral:  10/18/17               Reason for consult:  Facility Placement, Discharge Planning                Permission sought to share information with:  Facility Sport and exercise psychologist, Family Supports Permission granted to share information::  Yes, Verbal Permission Granted  Name::        Agency::  [facility]  Relationship::  granddaughter  Contact Information:     Housing/Transportation Living arrangements for the past 2 months:  Single Family Home Source of Information:  Patient Patient Interpreter Needed:  None Criminal Activity/Legal Involvement Pertinent to Current Situation/Hospitalization:  No - Comment as needed Significant Relationships:  Adult Children, Other Family Members Lives with:  Self Do you feel safe going back to the place where you live?  Yes Need for family participation in patient care:  Yes (Comment)  Care giving concerns:  Pt states that she lives alone and has nobody that comes by to help supervise, just to visit. Is requiring more support for mobility and supervision than she currently has.    Social Worker assessment / plan: CSW met with pt at bedside while she ate lunch. Pt states that she lives at home alone with some family members nearby. Pt states that her grandchildren come by "but mostly for visits, not to help me out with much." Pt worked with therapies and was amenable to SNF, preference for [facility] which has offered a bed. Pt states that she would prefer to go home but knows more therapy is probably important. Pt lost her husband in December. CSW reflected with pt on that loss and spoke about their marriage and went through a short life review. Pt expressed gratitude for the time to talk about her husband.   CSW continuing to follow.    Employment status:  Retired Forensic scientist:  Medicare PT Recommendations:   [place] / Referral to community resources:  [facility])  Patient/Family's Response to care:  Pt amenable to [place] visit, understands CSW role, and SNF recommendation.   Patient/Family's Understanding of and Emotional Response to Diagnosis, Current Treatment, and Prognosis: Pt states understanding of diagnosis, current treatment, and prognosis. Pt states realistic goals and expectations, understands her limitations and looks forward to getting better. Pt was emotionally calm, only briefly tearing up when telling CSW about her husband stating that "you never really get over that loss." CSW validated this feeling and provided supportive listening for pt.   Emotional Assessment Appearance:  Appears stated age Attitude/Demeanor/Rapport:  Gracious, Engaged Affect (typically observed):  Accepting, Adaptable, Pleasant Orientation:  Oriented to Situation, Oriented to  Time, Oriented to Place, Oriented to Self Alcohol / Substance use:  Not Applicable Psych involvement (Current and /or in the community):  No (Comment)  Discharge Needs  Concerns to be addressed:  Care Coordination, Discharge Planning Concerns Readmission within the last 30 days:    Current discharge risk:  Lives alone, Physical Impairment Barriers to Discharge:  Continued Medical Work up   [employer], [place] 10/18/2017, 2:01 PM

## 2017-10-18 NOTE — Progress Notes (Signed)
Physical Therapy Treatment Patient Details Name: JILLISA HARRIS MRN: [id number] DOB: 11-Feb-1925 Today's Date: 10/18/2017    History of Present Illness WILDA WETHERELL is a 84 y.o. female with a Past Medical History significant for htn, hld, OA, anxiety who presents with R abd pain. She has history of IBS as well as possible colitis on CT has anxiety disorder. Has acute on chronic low back pain.    PT Comments    Patient continues to be unsafe to discharge home alone due to assistance required for safe transfer and ambulation and will benefit from continued post-acute therapy. Currently recommending skilled nursing facility stay unless caregivers can provide assistance with all out of bed activities.     Follow Up Recommendations  SNF;Supervision/Assistance - 24 hour     Equipment Recommendations  None recommended by PT    Recommendations for Other Services       Precautions / Restrictions Precautions Precautions: Fall Precaution Comments: left wrist brace due to recent sprain per pt Restrictions Weight Bearing Restrictions: No    Mobility  Bed Mobility               General bed mobility comments: OOB in chair on therapist arrival.  Transfers Overall transfer level: Needs assistance Equipment used: Rolling walker (2 wheeled) Transfers: Sit to/from Stand Sit to Stand: Min assist         General transfer comment: Attempts to pull up on RW. min guard once ambulating.   Ambulation/Gait Ambulation/Gait assistance: Min guard Ambulation Distance (Feet): 250 Feet Assistive device: Rolling walker (2 wheeled) Gait Pattern/deviations: Step-through pattern;Decreased step length - right;Decreased step length - left;Decreased stride length;Trunk flexed;Drifts right/left   Gait velocity interpretation: Below normal speed for age/gender General Gait Details: Patient performance and safety much improved today. Safety still a concern.   Stairs            Wheelchair  Mobility    Modified Rankin (Stroke Patients Only)       Balance Overall balance assessment: Needs assistance;History of Falls Sitting-balance support: No upper extremity supported;Feet supported Sitting balance-Leahy Scale: Good     Standing balance support: Bilateral upper extremity supported;During functional activity Standing balance-Leahy Scale: Fair                              Cognition Arousal/Alertness: Awake/alert Behavior During Therapy: WFL for tasks assessed/performed Overall Cognitive Status: No family/caregiver present to determine baseline cognitive functioning                                        Exercises General Exercises - Lower Extremity Long Arc Quad: Seated;Strengthening;Both;10 reps Hip Flexion/Marching: Seated;Strengthening;Both;10 reps Toe Raises: Seated;Strengthening;Both;10 reps Heel Raises: Seated;Strengthening;Both;10 reps    General Comments        Pertinent Vitals/Pain Pain Assessment: 0-10 Pain Score: 4  Pain Location: Rt abdomen; 8/10 upper leg nerve pain    Home Living                      Prior Function            PT Goals (current goals can now be found in the care plan section) Progress towards PT goals: Progressing toward goals    Frequency    Min 3X/week      PT Plan Current plan remains appropriate  Co-evaluation              AM-PAC PT "6 Clicks" Daily Activity  Outcome Measure  Difficulty turning over in bed (including adjusting bedclothes, sheets and blankets)?: A Little Difficulty moving from lying on back to sitting on the side of the bed? : A Little Difficulty sitting down on and standing up from a chair with arms (e.g., wheelchair, bedside commode, etc,.)?: A Little Help needed moving to and from a bed to chair (including a wheelchair)?: A Little Help needed walking in hospital room?: A Little Help needed climbing 3-5 steps with a railing? : A Little 6  Click Score: 18    End of Session Equipment Utilized During Treatment: Gait belt Activity Tolerance: Patient tolerated treatment well Patient left: in chair;with call bell/phone within reach Nurse Communication: Mobility status PT Visit Diagnosis: Muscle weakness (generalized) (M62.81);Unsteadiness on feet (R26.81);History of falling (Z91.81)     Time: 4196-2229 PT Time Calculation (min) (ACUTE ONLY): 31 min  Charges:  $Gait Training: 8-22 mins $Therapeutic Exercise: 8-22 mins                    G Codes:      Mateus Rewerts D. Hartnett-Rands, MS, PT Per [place] [phone] 10/18/2017, 11:53 AM

## 2017-10-18 NOTE — Progress Notes (Signed)
CRITICAL VALUE ALERT  Critical Value:  K+ 2.3  Date & Time Notied:  10/18/2017-1113  Provider Notified: Jonelle Sidle  Orders Received/Actions taken: ordered KCL tablets to be administered

## 2017-10-18 NOTE — Progress Notes (Signed)
Patient ID: Marissa Ryan, female   DOB: 04-May-1925, 84 y.o.   MRN: [id number]  PROGRESS NOTE    Marissa Ryan  WUJ:[id number] DOB: 1924-10-14 DOA: 10/13/2017 PCP: Crist Infante, MD    Brief Narrative: Marissa Ryan is a 84 y.o. female with a Past Medical History significant for htn, hld, OA, anxiety who presents with R abd pain. She has history of IBS as well as possible colitis on CT has anxiety disorder. Has acute on chronic low back pain.  Assessment & Plan:   Principal Problem:   Abdominal pain Active Problems:   Essential hypertension   Hyperlipidemia   Diabetes (HCC)   IBS (irritable bowel syndrome)   Anxiety   Anemia   Pressure injury of skin   1 persistent hypokalemia: Patient's potassium has remained low. More than likely secondary to GI loss. We will aggressively replete potassium. Check her magnesium level  2. RUQ abdominal pain: Still has some pain although Much improved. Has had BM this morning. Tolerating clear liquid diet. Will advance diet. Continue antibiotics.  3 anxiety disorder: Less anxious today. Continue Ativan  4 Diabetes: On SSI. Appears to be controlled.  5. Hyperlipidemia: Continue statins  6.  Hypertension: Blood pressure is controlled now.  Continue management  7.  Hyponatremia: Due to Dehydration. Continue fluids and close monitoring.   8.  Low back pain: Patient has chronic low back pain.  Continue home regimen.  On oxycodone and tramadol   DVT prophylaxis: SCD  Code Status: Full  Family Communication: None available  Disposition Plan: Home  Consultants:   None  Procedures:  Antimicrobials: Unasyn day 3/7  Subjective: Patient still has right upper quadrant abdominal pain with distention. Has been having good BM.  Objective: Vitals:   10/17/17 0518 10/17/17 1446 10/17/17 2027 10/18/17 0527  BP: (!) 159/68 (!) 135/55 (!) 158/65 (!) 168/78  Pulse: 91 80 78 76  Resp: 19 18 18 17   Temp: 98.4 F (36.9 C) 98.4 F (36.9 C) 98.4 F  (36.9 C) 100.1 F (37.8 C)  TempSrc: Oral Oral Oral Oral  SpO2: 94% 98% 97% 98%  Weight:      Height:        Intake/Output Summary (Last 24 hours) at 10/18/2017 1335 Last data filed at 10/18/2017 0900 Gross per 24 hour  Intake 3463.33 ml  Output -  Net 3463.33 ml   Filed Weights   10/14/17 1756 10/15/17 0532  Weight: 61.7 kg (136 lb 0.4 oz) 60.6 kg (133 lb 9.6 oz)    Examination:  General exam: Appears calm and comfortable  Respiratory system: Clear to auscultation. Respiratory effort normal. Cardiovascular system: S1 & S2 heard, RRR. No JVD, murmurs, rubs, gallops or clicks. No pedal edema. Gastrointestinal system: Abdomen isdistended, soft and mildly tender. No organomegaly or masses felt. Normal bowel sounds heard. Central nervous system: Alert and oriented. No focal neurological deficits. Extremities: Symmetric 5 x 5 power. Skin: No rashes, lesions or ulcers Psychiatry: Judgement and insight appear normal. Mood & affect appropriate.     Data Reviewed: I have personally reviewed following labs and imaging studies  CBC: Recent Labs  Lab 10/14/17 0015 10/14/17 0613 10/14/17 1529 10/15/17 0640 10/16/17 0912 10/17/17 1023  WBC 12.0* 8.1 11.6* 9.2 8.3 5.6  NEUTROABS 9.4*  --   --   --  6.6 4.0  HGB 10.9* 9.9* 10.9* 10.6* 10.7* 10.9*  HCT 33.9* 30.1* 33.5* 33.3* 33.6* 33.1*  MCV 93.4 93.8 93.3 94.3 94.9 95.1  PLT 447*  397 440* 435* 395 300   Basic Metabolic Panel: Recent Labs  Lab 10/14/17 1529 10/15/17 0640 10/16/17 0912 10/17/17 1023 10/18/17 1024  NA 131* 136 135 131* 129*  K 3.5 3.1* 3.1* 2.8* 2.3*  CL 102 107 106 102 99*  CO2 20* 21* 22 17* 20*  GLUCOSE 89 129* 125* 171* 196*  BUN 5* <5* <5* <5* <5*  CREATININE 0.59 0.60 0.49 0.61 0.63  CALCIUM 8.5* 8.2* 8.2* 7.8* 7.8*   GFR: Estimated Creatinine Clearance: 39 mL/min (by C-G formula based on SCr of 0.63 mg/dL). Liver Function Tests: Recent Labs  Lab 10/14/17 0015 10/16/17 0912 10/17/17 1023    AST 32 26 29  ALT 23 22 22   ALKPHOS 58 62 56  BILITOT 0.6 0.2* 0.4  PROT 6.2* 5.6* 5.3*  ALBUMIN 3.5 3.0* 3.0*   Recent Labs  Lab 10/14/17 0015  LIPASE 38   No results for input(s): AMMONIA in the last 168 hours. Coagulation Profile: No results for input(s): INR, PROTIME in the last 168 hours. Cardiac Enzymes: Recent Labs  Lab 10/14/17 0015 10/14/17 0722 10/14/17 1529 10/14/17 2331  TROPONINI <0.03 <0.03 <0.03 <0.03   BNP (last 3 results) No results for input(s): PROBNP in the last 8760 hours. HbA1C: No results for input(s): HGBA1C in the last 72 hours. CBG: Recent Labs  Lab 10/17/17 2024 10/18/17 0126 10/18/17 0522 10/18/17 0830 10/18/17 1254  GLUCAP 109* 90 89 90 114*   Lipid Profile: No results for input(s): CHOL, HDL, LDLCALC, TRIG, CHOLHDL, LDLDIRECT in the last 72 hours. Thyroid Function Tests: No results for input(s): TSH, T4TOTAL, FREET4, T3FREE, THYROIDAB in the last 72 hours. Anemia Panel: No results for input(s): VITAMINB12, FOLATE, FERRITIN, TIBC, IRON, RETICCTPCT in the last 72 hours. Urine analysis:    Component Value Date/Time   COLORURINE STRAW (A) 10/14/2017 0722   APPEARANCEUR CLEAR 10/14/2017 0722   LABSPEC 1.006 10/14/2017 0722   PHURINE 7.0 10/14/2017 0722   GLUCOSEU NEGATIVE 10/14/2017 0722   HGBUR SMALL (A) 10/14/2017 0722   BILIRUBINUR NEGATIVE 10/14/2017 0722   KETONESUR 20 (A) 10/14/2017 0722   PROTEINUR NEGATIVE 10/14/2017 0722   UROBILINOGEN 0.2 10/12/2014 0315   NITRITE NEGATIVE 10/14/2017 0722   LEUKOCYTESUR NEGATIVE 10/14/2017 0722   Sepsis Labs: @LABRCNTIP (procalcitonin:4,lacticidven:4)  ) Recent Results (from the past 240 hour(s))  Urine culture     Status: None   Collection Time: 10/14/17  9:20 PM  Result Value Ref Range Status   Specimen Description URINE, CLEAN CATCH  Final   Special Requests NONE  Final   Culture   Final    NO GROWTH Performed at [facility], [place] [address], [place], [place]  [address]    Report Status 10/16/2017 FINAL  Final  Gastrointestinal Panel by PCR , Stool     Status: None   Collection Time: 10/17/17 11:50 AM  Result Value Ref Range Status   Campylobacter species NOT DETECTED NOT DETECTED Final   Plesimonas shigelloides NOT DETECTED NOT DETECTED Final   Salmonella species NOT DETECTED NOT DETECTED Final   Yersinia enterocolitica NOT DETECTED NOT DETECTED Final   Vibrio species NOT DETECTED NOT DETECTED Final   Vibrio cholerae NOT DETECTED NOT DETECTED Final   Enteroaggregative E coli (EAEC) NOT DETECTED NOT DETECTED Final   Enteropathogenic E coli (EPEC) NOT DETECTED NOT DETECTED Final   Enterotoxigenic E coli (ETEC) NOT DETECTED NOT DETECTED Final   Shiga like toxin producing E coli (STEC) NOT DETECTED NOT DETECTED Final   Shigella/Enteroinvasive  E coli (EIEC) NOT DETECTED NOT DETECTED Final   Cryptosporidium NOT DETECTED NOT DETECTED Final   Cyclospora cayetanensis NOT DETECTED NOT DETECTED Final   Entamoeba histolytica NOT DETECTED NOT DETECTED Final   Giardia lamblia NOT DETECTED NOT DETECTED Final   Adenovirus F40/41 NOT DETECTED NOT DETECTED Final   Astrovirus NOT DETECTED NOT DETECTED Final   Norovirus GI/GII NOT DETECTED NOT DETECTED Final   Rotavirus A NOT DETECTED NOT DETECTED Final   Sapovirus (I, II, IV, and V) NOT DETECTED NOT DETECTED Final    Comment: Performed at [facility], [address], [place], [place] [address]         Radiology Studies: No results found.      Scheduled Meds: . ALPRAZolam  0.25 mg Oral QHS  . amLODipine  5 mg Oral Daily  . ezetimibe-simvastatin  0.5 tablet Oral QHS  . insulin aspart  0-9 Units Subcutaneous TID WC  . losartan  50 mg Oral Q supper  . potassium chloride  20 mEq Oral Once  . potassium chloride  40 mEq Oral Q4H  . prednisoLONE acetate  1 drop Right Eye BID  . sertraline  50 mg Oral Daily   Continuous Infusions: . sodium chloride 100 mL/hr at 10/18/17 0341  .  ampicillin-sulbactam (UNASYN) IV Stopped (10/18/17 1025)     LOS: 4 days    Time spent: 38 minutes   GARBA,LAWAL, MD [facility] Pager [phone]  If 7PM-7AM, please contact night-coverage [email or web address] Password [email or web address] 10/18/2017, 1:35 PM

## 2017-10-19 LAB — BASIC METABOLIC PANEL
Anion gap: 11 (ref 5–15)
BUN: <5 mg/dL — ABNORMAL LOW (ref 6–20)
CHLORIDE: 102 mmol/L (ref 101–111)
CO2: 21 mmol/L — ABNORMAL LOW (ref 22–32)
CREATININE: 0.54 mg/dL (ref 0.44–1.00)
Calcium: 7.9 mg/dL — ABNORMAL LOW (ref 8.9–10.3)
Glucose, Bld: 106 mg/dL — ABNORMAL HIGH (ref 65–99)
POTASSIUM: 3.8 mmol/L (ref 3.5–5.1)
SODIUM: 134 mmol/L — AB (ref 135–145)

## 2017-10-19 LAB — GLUCOSE, CAPILLARY
GLUCOSE-CAPILLARY: 140 mg/dL — AB (ref 65–99)
GLUCOSE-CAPILLARY: 79 mg/dL (ref 65–99)
GLUCOSE-CAPILLARY: 82 mg/dL (ref 65–99)
Glucose-Capillary: 102 mg/dL — ABNORMAL HIGH (ref 65–99)
Glucose-Capillary: 84 mg/dL (ref 65–99)
Glucose-Capillary: 142 mg/dL — ABNORMAL HIGH (ref 65–99)

## 2017-10-19 NOTE — Care Management Important Message (Signed)
Important Message  Patient Details  Name: Marissa Ryan MRN: [id number] Date of Birth: March 11, 1925   Medicare Important Message Given:  Yes    Elisama Thissen [address]ine Circle 10/19/2017, 1:55 PM

## 2017-10-19 NOTE — Progress Notes (Signed)
ANTIBIOTIC CONSULT NOTE   Pharmacy Consult for Unasyn Indication: Intra-abdominal infection  Allergies  Allergen Reactions  . Robaxin [Methocarbamol] Rash    Red rash on right side of face and forehead cleared after benadryl given ( per [name] )  . Ancef [Cefazolin] Diarrhea  . Morphine And Related Itching  . Penicillins Itching    Tolerated Unasyn  . Flurbiprofen Diarrhea    Patient Measurements: Height: 5\' 1"  (154.9 cm) Weight: 138 lb (62.6 kg) IBW/kg (Calculated) : 47.8 Adjusted Body Weight:    Vital Signs: Temp: 98 F (36.7 C) (04/03 0509) Temp Source: Oral (04/03 0509) BP: 138/62 (04/03 0509) Pulse Rate: 72 (04/03 0509) Intake/Output from previous day: 04/02 0701 - 04/03 0700 In: 4443.3 [P.O.:2150; I.V.:1893.3; IV Piggyback:400] Out: -  Intake/Output from this shift: Total I/O In: 100 [IV Piggyback:100] Out: 750 [Urine:750]  Labs: Recent Labs    10/17/17 1023 10/18/17 1024 10/19/17 0429  WBC 5.6  --   --   HGB 10.9*  --   --   PLT 329  --   --   CREATININE 0.61 0.63 0.54   Estimated Creatinine Clearance: 39.6 mL/min (by C-G formula based on SCr of 0.54 mg/dL). No results for input(s): VANCOTROUGH, VANCOPEAK, VANCORANDOM, GENTTROUGH, GENTPEAK, GENTRANDOM, TOBRATROUGH, TOBRAPEAK, TOBRARND, AMIKACINPEAK, AMIKACINTROU, AMIKACIN in the last 72 hours.   Microbiology:   Medical History: Past Medical History:  Diagnosis Date  . Anxiety   . Arthritis   . Cancer ([place])    hx of skin cancer removal on hand   . Diabetes mellitus without complication ([place])   . Gallstones   . Hyperlipidemia   . Hypertension   . IBS (irritable bowel syndrome)   . Osteoporosis     Assessment: ID: On day #6 of Unasyn for abdominal infection. Afebrile, WBC wnl. CrCl 39  Unasyn 3/29>>  4/1: GI panel: negative 3/29: Ucx: negative  Goal of Therapy:  Eradication of infection   Plan:  Unasyn 1.5g IV q6 Pharmacy will sign off. Please reconsult for further dosing  assitance.   Joellen Tullos S. Alford Highland, PharmD, BCPS Clinical Staff Pharmacist Pager [phone]  Eilene Ghazi Stillinger 10/19/2017,1:19 PM

## 2017-10-19 NOTE — Consult Note (Signed)
[facility] CM Primary Care Navigator  10/19/2017  Marissa Ryan 01-29-1925 [id number]  Met with patient and granddaughter Marissa Ryan) at the bedside to identify possible discharge needs. Patient reportshaving persistent abdominal pain and diarrhea related to "colitis" which resulted to this admission.  Patient endorses Dr.Mark Perini with [place] astheprimary care provider.   [facility] [address].  Granddaughterstates that patient has beenmanagingherownmedicationsat homestraight out of the containers.  According to granddaughter, patient still drives prior to admission. Patient's daughter (Marissa Ryan)provides transportation to her doctors'appointments whenever needed.  Patientlives alone at home and still independent with self care prior to admission. Per granddaughter, patient has been the primary caregiver for herself since husband died in 07/13/23. Her daughterhas been providingassistance withany of her needs at home.  Anticipated discharge plan is skilled nursing facility (SNF-in process) for rehabilitationper therapy recommendation,prior to returninghome.  Prefers [facility].  Patient's granddaughter voiced understanding to callprimary care provider's officewhen patient returnsbackhometo schedulea post discharge follow-upvisit within1-2 weeksor sooner if needs arise. Patient letter (with PCP's contact number) was provided as areminder.  Explained topatient's granddaughterregardingTHN CM services available for health managementand resourcesat home and had expressed interest.  Granddaughter reports that patient had been managing her health needs for years (mainly diabetes) with medication, diet, monitoring and close follow-up with provider.  Patient's granddaughter voiced understandingof needto seek referral from primary care provider  to [facility] care management asdeemed necessary and appropriatefor services in the near future-once patient returnsback home.  [facility] care management information provided for future needs thatpatient may have.   For additional questions please contact:  Edwena Felty A. Romuald Mccaslin, BSN, RN-BC [facility] PRIMARY CARE Navigator Cell: [phone]

## 2017-10-19 NOTE — Progress Notes (Signed)
Patient ID: Marissa Ryan, female   DOB: [birth year], 84 y.o.   MRN: [id number]  PROGRESS NOTE    Marissa Ryan  [id number]:[id number] DOB: [birth year] DOA: 10/13/2017 PCP: Crist Infante, MD    Brief Narrative: Marissa Ryan is a 84 y.o. female with a Past Medical History significant for htn, hld, OA, anxiety who presents with R abd pain. She has history of IBS as well as possible colitis on CT has anxiety disorder. Has acute on chronic low back pain.  Assessment & Plan:   Principal Problem:   Abdominal pain Active Problems:   Essential hypertension   Hyperlipidemia   Diabetes (HCC)   IBS (irritable bowel syndrome)   Anxiety   Anemia   Pressure injury of skin   1 persistent hypokalemia: Patient's potassium has been corrected today at 3.8. We will recheck level in the morning.  2. RUQ abdominal pain: patient is improving. Advance diet to soft mechanical today.  3 anxiety disorder: Stable at baseline. Continue Ativan  4 Diabetes: On SSI. Appears to be controlled.  5. Hyperlipidemia: Continue statins  6.  Hypertension: Blood pressure is mostly controlled.  Continue management  7.  Hyponatremia: Due to Dehydration. Recheck BMP in the morning  8.  Low back pain: Patient has chronic low back pain.  Continue home regimen.  On oxycodone and tramadol   DVT prophylaxis: SCD  Code Status: Full  Family Communication: Daughter and granddaughter at bedside  Disposition Plan: Home  Consultants:   None  Procedures:  Antimicrobials: Unasyn day 3/7  Subjective: Patient still has right upper quadrant abdominal pain with distention. Has been having good BM.  Objective: Vitals:   10/18/17 0527 10/18/17 1405 10/18/17 2142 10/19/17 0509  BP: (!) 168/78 (!) 145/66 (!) 142/60 138/62  Pulse: 76 63 70 72  Resp: 17  17 18   Temp: 100.1 F (37.8 C) 98.2 F (36.8 C) 98.4 F (36.9 C) 98 F (36.7 C)  TempSrc: Oral Oral Axillary Oral  SpO2: 98% 97% 99% 98%  Weight:    62.6 kg (138 lb)    Height:        Intake/Output Summary (Last 24 hours) at 10/19/2017 0806 Last data filed at 10/19/2017 0747 Gross per 24 hour  Intake 4443.34 ml  Output 550 ml  Net 3893.34 ml   Filed Weights   10/14/17 1756 10/15/17 0532 10/19/17 0509  Weight: 61.7 kg (136 lb 0.4 oz) 60.6 kg (133 lb 9.6 oz) 62.6 kg (138 lb)    Examination:  General exam: Appears calm and comfortable  Respiratory system: Clear to auscultation. Respiratory effort normal. Cardiovascular system: S1 & S2 heard, RRR. No JVD, murmurs, rubs, gallops or clicks. No pedal edema. Gastrointestinal system: Abdomen isdistended, soft and mildly tender. No organomegaly or masses felt. Normal bowel sounds heard. Central nervous system: Alert and oriented. No focal neurological deficits. Extremities: Symmetric 5 x 5 power. Skin: No rashes, lesions or ulcers Psychiatry: Judgement and insight appear normal. Mood & affect appropriate.     Data Reviewed: I have personally reviewed following labs and imaging studies  CBC: Recent Labs  Lab 10/14/17 0015 10/14/17 0613 10/14/17 1529 10/15/17 0640 10/16/17 0912 10/17/17 1023  WBC 12.0* 8.1 11.6* 9.2 8.3 5.6  NEUTROABS 9.4*  --   --   --  6.6 4.0  HGB 10.9* 9.9* 10.9* 10.6* 10.7* 10.9*  HCT 33.9* 30.1* 33.5* 33.3* 33.6* 33.1*  MCV 93.4 93.8 93.3 94.3 94.9 95.1  PLT 447* 397 440* 435* 395 329  Basic Metabolic Panel: Recent Labs  Lab 10/15/17 0640 10/16/17 0912 10/17/17 1023 10/18/17 1024 10/19/17 0429  NA 136 135 131* 129* 134*  K 3.1* 3.1* 2.8* 2.3* 3.8  CL 107 106 102 99* 102  CO2 21* 22 17* 20* 21*  GLUCOSE 129* 125* 171* 196* 106*  BUN <5* <5* <5* <5* <5*  CREATININE 0.60 0.49 0.61 0.63 0.54  CALCIUM 8.2* 8.2* 7.8* 7.8* 7.9*  MG  --   --   --  1.3*  --    GFR: Estimated Creatinine Clearance: 39.6 mL/min (by C-G formula based on SCr of 0.54 mg/dL). Liver Function Tests: Recent Labs  Lab 10/14/17 0015 10/16/17 0912 10/17/17 1023  AST 32 26 29  ALT 23 22 22    ALKPHOS 58 62 56  BILITOT 0.6 0.2* 0.4  PROT 6.2* 5.6* 5.3*  ALBUMIN 3.5 3.0* 3.0*   Recent Labs  Lab 10/14/17 0015  LIPASE 38   No results for input(s): AMMONIA in the last 168 hours. Coagulation Profile: No results for input(s): INR, PROTIME in the last 168 hours. Cardiac Enzymes: Recent Labs  Lab 10/14/17 0015 10/14/17 0722 10/14/17 1529 10/14/17 2331  TROPONINI <0.03 <0.03 <0.03 <0.03   BNP (last 3 results) No results for input(s): PROBNP in the last 8760 hours. HbA1C: No results for input(s): HGBA1C in the last 72 hours. CBG: Recent Labs  Lab 10/18/17 1254 10/18/17 1611 10/18/17 2002 10/19/17 0037 10/19/17 0356  GLUCAP 114* 134* 97 82 79   Lipid Profile: No results for input(s): CHOL, HDL, LDLCALC, TRIG, CHOLHDL, LDLDIRECT in the last 72 hours. Thyroid Function Tests: No results for input(s): TSH, T4TOTAL, FREET4, T3FREE, THYROIDAB in the last 72 hours. Anemia Panel: No results for input(s): VITAMINB12, FOLATE, FERRITIN, TIBC, IRON, RETICCTPCT in the last 72 hours. Urine analysis:    Component Value Date/Time   COLORURINE STRAW (A) 10/14/2017 0722   APPEARANCEUR CLEAR 10/14/2017 0722   LABSPEC 1.006 10/14/2017 0722   PHURINE 7.0 10/14/2017 0722   GLUCOSEU NEGATIVE 10/14/2017 0722   HGBUR SMALL (A) 10/14/2017 0722   BILIRUBINUR NEGATIVE 10/14/2017 0722   KETONESUR 20 (A) 10/14/2017 0722   PROTEINUR NEGATIVE 10/14/2017 0722   UROBILINOGEN 0.2 10/12/2014 0315   NITRITE NEGATIVE 10/14/2017 0722   LEUKOCYTESUR NEGATIVE 10/14/2017 0722   Sepsis Labs: @LABRCNTIP (procalcitonin:4,lacticidven:4)  ) Recent Results (from the past 240 hour(s))  Urine culture     Status: None   Collection Time: 10/14/17  9:20 PM  Result Value Ref Range Status   Specimen Description URINE, CLEAN CATCH  Final   Special Requests NONE  Final   Culture   Final    NO GROWTH Performed at [facility], [place] [address], [place],  [address]    Report Status  10/16/2017 FINAL  Final  Gastrointestinal Panel by PCR , Stool     Status: None   Collection Time: 10/17/17 11:50 AM  Result Value Ref Range Status   Campylobacter species NOT DETECTED NOT DETECTED Final   Plesimonas shigelloides NOT DETECTED NOT DETECTED Final   Salmonella species NOT DETECTED NOT DETECTED Final   Yersinia enterocolitica NOT DETECTED NOT DETECTED Final   Vibrio species NOT DETECTED NOT DETECTED Final   Vibrio cholerae NOT DETECTED NOT DETECTED Final   Enteroaggregative E coli (EAEC) NOT DETECTED NOT DETECTED Final   Enteropathogenic E coli (EPEC) NOT DETECTED NOT DETECTED Final   Enterotoxigenic E coli (ETEC) NOT DETECTED NOT DETECTED Final   Shiga like toxin producing E coli (STEC) NOT  DETECTED NOT DETECTED Final   Shigella/Enteroinvasive E coli (EIEC) NOT DETECTED NOT DETECTED Final   Cryptosporidium NOT DETECTED NOT DETECTED Final   Cyclospora cayetanensis NOT DETECTED NOT DETECTED Final   Entamoeba histolytica NOT DETECTED NOT DETECTED Final   Giardia lamblia NOT DETECTED NOT DETECTED Final   Adenovirus F40/41 NOT DETECTED NOT DETECTED Final   Astrovirus NOT DETECTED NOT DETECTED Final   Norovirus GI/GII NOT DETECTED NOT DETECTED Final   Rotavirus A NOT DETECTED NOT DETECTED Final   Sapovirus (I, II, IV, and V) NOT DETECTED NOT DETECTED Final    Comment: Performed at [facility], [address], [place], [place] [address]         Radiology Studies: No results found.      Scheduled Meds: . ALPRAZolam  0.25 mg Oral QHS  . amLODipine  5 mg Oral Daily  . ezetimibe-simvastatin  0.5 tablet Oral QHS  . insulin aspart  0-9 Units Subcutaneous TID WC  . losartan  50 mg Oral Q supper  . prednisoLONE acetate  1 drop Right Eye BID  . sertraline  50 mg Oral Daily   Continuous Infusions: . sodium chloride 100 mL/hr at 10/19/17 0213  . ampicillin-sulbactam (UNASYN) IV 1.5 g (10/19/17 0755)     LOS: 5 days    Time spent: 64  minutes   Lanijah Warzecha,LAWAL, MD [facility] Pager [phone]  If 7PM-7AM, please contact night-coverage [email or web address] Password [email or web address] 10/19/2017, 8:06 AM

## 2017-10-20 ENCOUNTER — Inpatient Hospital Stay (HOSPITAL_COMMUNITY): Payer: Medicare Other

## 2017-10-20 ENCOUNTER — Encounter (HOSPITAL_COMMUNITY): Payer: Self-pay | Admitting: General Practice

## 2017-10-20 DIAGNOSIS — R1011 Right upper quadrant pain: Secondary | ICD-10-CM | POA: Diagnosis not present

## 2017-10-20 DIAGNOSIS — R109 Unspecified abdominal pain: Secondary | ICD-10-CM | POA: Diagnosis not present

## 2017-10-20 DIAGNOSIS — M545 Low back pain: Secondary | ICD-10-CM | POA: Diagnosis not present

## 2017-10-20 DIAGNOSIS — E119 Type 2 diabetes mellitus without complications: Secondary | ICD-10-CM | POA: Diagnosis not present

## 2017-10-20 DIAGNOSIS — K5190 Ulcerative colitis, unspecified, without complications: Secondary | ICD-10-CM | POA: Diagnosis not present

## 2017-10-20 DIAGNOSIS — Z9181 History of falling: Secondary | ICD-10-CM | POA: Diagnosis not present

## 2017-10-20 DIAGNOSIS — K580 Irritable bowel syndrome with diarrhea: Secondary | ICD-10-CM

## 2017-10-20 DIAGNOSIS — R278 Other lack of coordination: Secondary | ICD-10-CM | POA: Diagnosis not present

## 2017-10-20 DIAGNOSIS — F419 Anxiety disorder, unspecified: Secondary | ICD-10-CM | POA: Diagnosis not present

## 2017-10-20 DIAGNOSIS — R2681 Unsteadiness on feet: Secondary | ICD-10-CM | POA: Diagnosis not present

## 2017-10-20 DIAGNOSIS — I10 Essential (primary) hypertension: Secondary | ICD-10-CM | POA: Diagnosis not present

## 2017-10-20 DIAGNOSIS — L89152 Pressure ulcer of sacral region, stage 2: Secondary | ICD-10-CM | POA: Diagnosis not present

## 2017-10-20 DIAGNOSIS — R05 Cough: Secondary | ICD-10-CM | POA: Diagnosis not present

## 2017-10-20 DIAGNOSIS — M810 Age-related osteoporosis without current pathological fracture: Secondary | ICD-10-CM | POA: Diagnosis not present

## 2017-10-20 DIAGNOSIS — M546 Pain in thoracic spine: Secondary | ICD-10-CM | POA: Diagnosis not present

## 2017-10-20 DIAGNOSIS — M6281 Muscle weakness (generalized): Secondary | ICD-10-CM | POA: Diagnosis not present

## 2017-10-20 DIAGNOSIS — R2689 Other abnormalities of gait and mobility: Secondary | ICD-10-CM | POA: Diagnosis not present

## 2017-10-20 DIAGNOSIS — K529 Noninfective gastroenteritis and colitis, unspecified: Secondary | ICD-10-CM | POA: Diagnosis not present

## 2017-10-20 DIAGNOSIS — E0800 Diabetes mellitus due to underlying condition with hyperosmolarity without nonketotic hyperglycemic-hyperosmolar coma (NKHHC): Secondary | ICD-10-CM | POA: Diagnosis not present

## 2017-10-20 LAB — GLUCOSE, CAPILLARY
GLUCOSE-CAPILLARY: 74 mg/dL (ref 65–99)
GLUCOSE-CAPILLARY: 113 mg/dL — AB (ref 65–99)
GLUCOSE-CAPILLARY: 206 mg/dL — AB (ref 65–99)
Glucose-Capillary: 89 mg/dL (ref 65–99)

## 2017-10-20 LAB — CBC WITH DIFFERENTIAL/PLATELET
BASOS ABS: 0 10*3/uL (ref 0.0–0.1)
BASOS PCT: 0 %
EOS PCT: 2 %
Eosinophils Absolute: 0.1 10*3/uL (ref 0.0–0.7)
HEMATOCRIT: 36.4 % (ref 36.0–46.0)
Hemoglobin: 11.8 g/dL — ABNORMAL LOW (ref 12.0–15.0)
LYMPHS PCT: 32 %
Lymphs Abs: 1.2 10*3/uL (ref 0.7–4.0)
MCH: 30.3 pg (ref 26.0–34.0)
MCHC: 32.4 g/dL (ref 30.0–36.0)
MCV: 93.6 fL (ref 78.0–100.0)
MONOS PCT: 13 %
Monocytes Absolute: 0.5 10*3/uL (ref 0.1–1.0)
Neutro Abs: 2.1 10*3/uL (ref 1.7–7.7)
Neutrophils Relative %: 53 %
PLATELETS: 281 10*3/uL (ref 150–400)
RBC: 3.89 MIL/uL (ref 3.87–5.11)
RDW: 13.8 % (ref 11.5–15.5)
WBC: 3.9 10*3/uL — ABNORMAL LOW (ref 4.0–10.5)

## 2017-10-20 LAB — COMPREHENSIVE METABOLIC PANEL
ALT: 23 U/L (ref 14–54)
ANION GAP: 10 (ref 5–15)
AST: 31 U/L (ref 15–41)
Albumin: 3 g/dL — ABNORMAL LOW (ref 3.5–5.0)
Alkaline Phosphatase: 60 U/L (ref 38–126)
BILIRUBIN TOTAL: 0.3 mg/dL (ref 0.3–1.2)
BUN: 6 mg/dL (ref 6–20)
CHLORIDE: 101 mmol/L (ref 101–111)
CO2: 22 mmol/L (ref 22–32)
Calcium: 8.1 mg/dL — ABNORMAL LOW (ref 8.9–10.3)
Creatinine, Ser: 0.56 mg/dL (ref 0.44–1.00)
GFR calc Af Amer: >60 mL/min (ref >60)
Glucose, Bld: 100 mg/dL — ABNORMAL HIGH (ref 65–99)
POTASSIUM: 3.3 mmol/L — AB (ref 3.5–5.1)
Sodium: 133 mmol/L — ABNORMAL LOW (ref 135–145)
TOTAL PROTEIN: 5.4 g/dL — AB (ref 6.5–8.1)

## 2017-10-20 MED ORDER — ALPRAZOLAM 0.25 MG PO TABS: 0.2500 mg | ORAL_TABLET | Freq: Every day | ORAL | 0 refills | Status: DC

## 2017-10-20 MED ORDER — GUAIFENESIN ER 600 MG PO TB12
600.0000 mg | ORAL_TABLET | Freq: Two times a day (BID) | ORAL | Status: DC
  Administered 2017-10-20: 600 mg via ORAL
  Filled 2017-10-20: qty 1

## 2017-10-20 MED ORDER — AZITHROMYCIN 500 MG PO TABS: 500.0000 mg | ORAL_TABLET | Freq: Every day | ORAL | 0 refills | Status: AC

## 2017-10-20 MED ORDER — GUAIFENESIN ER 600 MG PO TB12: 600.0000 mg | ORAL_TABLET | Freq: Two times a day (BID) | ORAL | Status: DC

## 2017-10-20 MED ORDER — OXYCODONE-ACETAMINOPHEN 10-325 MG PO TABS: 1.0000 | ORAL_TABLET | Freq: Four times a day (QID) | ORAL | 0 refills | Status: DC | PRN

## 2017-10-20 NOTE — Social Work (Signed)
CSW has spoken with pt and pt granddaughter at bedside, pt understands that discharge is today, pt granddaughter states that pt will be transported (per pt preference) by daughter in personal car.   CSW awaits discharge summary, signed scripts are on pt chart.  Alexander Mt, [place] Work [phone]

## 2017-10-20 NOTE — Discharge Summary (Signed)
Physician Discharge Summary  Marissa Ryan CVE:[id number] DOB: 11-07-1924 DOA: 10/13/2017  PCP: Crist Infante, MD  Admit date: 10/13/2017 Discharge date: 10/20/2017  Time spent: 32 minutes  Recommendations for Outpatient Follow-up:  1. Complete antibiotics  2. Will need referral to gastroenterology for irritable bowel syndrome 3. Discharged to [facility]   Discharge Diagnoses:  Principal Problem:   Abdominal pain Active Problems:   Essential hypertension   Hyperlipidemia   Diabetes (HCC)   IBS (irritable bowel syndrome)   Anxiety   Anemia   Pressure injury of skin   Discharge Condition: Good  Diet recommendation: Heart Healthy  Filed Weights   10/14/17 1756 10/15/17 0532 10/19/17 0509  Weight: 61.7 kg (136 lb 0.4 oz) 60.6 kg (133 lb 9.6 oz) 62.6 kg (138 lb)    History of present illness:  Marissa F Rivesis a 84 y.o.femalewith a Past Medical History significant for htn, hld, OA, anxiety who presents with R abd pain. She has history of IBS as well as possible colitis on CT has anxiety disorder. Has acute on chronic low back pain. She recently had compression fracture of the lumbar spine where her pain is coming from. Patient has also not been seen gastroenterologist for her IBS which is been diagnosed almost a decade ago.   Hospital Course:  Patient was admitted and started on IV Unasyn for colitis. She was nothing by mouth for a while but gradually restarted her on a diet. She has stage I decubitus ulcer of the Coccyx which was managed accordingly and she did well. Patient has diabetes but well managed on sliding scale insulin. With her irritable bowel she's continued to have pain. Diet has been advanced to diabetic diet at the moment. She also has history of hypertension and blood pressure medications where adjusted. She has extreme anxiety and otherwise pleasant. Patient has had issues with low potassium as low as 2.6 but has been repleted. She is weak from  acute illness but also has chronic low back pain due to compression fracture she recently had. Patient at this point will be discharged to skilled nursing facility. She is on chronic narcotics and continues to have that in the hospital. Once she is stable she'll be discharged back home. Patient has been complaining of cough the last 2 days. Lung exam showed no significant findings.  Procedures:  CT abdomen and pelvis showing possible colitis    Consultations:  Physical therapy and case management  Discharge Exam: Vitals:   10/20/17 1053 10/20/17 1346  BP: (!) 145/64 140/66  Pulse:  77  Resp:  18  Temp:  98 F (36.7 C)  SpO2:  96%    General: Stable, NAD Cardiovascular: RRR Respiratory: Good AE Bilaterally  Discharge Instructions   Discharge Instructions    Diet - low sodium heart healthy   Complete by:  As directed    Increase activity slowly   Complete by:  As directed      Allergies as of 10/20/2017      Reactions   Robaxin [methocarbamol] Rash   Red rash on right side of face and forehead cleared after benadryl given ( per [name] RN )   Ancef [cefazolin] Diarrhea   Morphine And Related Itching   Penicillins Itching   Tolerated Unasyn   Flurbiprofen Diarrhea      Medication List    TAKE these medications   ALPRAZolam 0.25 MG tablet Commonly known as:  XANAX Take 1 tablet (0.25 mg total) by mouth  at bedtime.   amLODipine 5 MG tablet Commonly known as:  NORVASC Take 5 mg by mouth daily.   aspirin EC 81 MG tablet Take 81 mg by mouth daily.   azithromycin 500 MG tablet Commonly known as:  ZITHROMAX Z-PAK Take 1 tablet (500 mg total) by mouth daily for 7 days. Take 2 tablets (500 mg) on  Day 1,  followed by 1 tablet (250 mg) once daily on Days 2 through 5.   carboxymethylcellulose 0.5 % Soln Commonly known as:  REFRESH PLUS 1 drop every hour as needed.   Cinnamon 500 MG capsule Take 500 mg by mouth daily.   clindamycin 150 MG capsule Commonly known  as:  CLEOCIN Take 1 capsule (150 mg total) by mouth 4 (four) times daily for 3 days.   DSS 100 MG Caps Take 100 mg by mouth 2 (two) times daily. What changed:    when to take this  reasons to take this   furosemide 20 MG tablet Commonly known as:  LASIX Take 20 mg by mouth daily.   gabapentin 300 MG capsule Commonly known as:  NEURONTIN Take 300 mg by mouth 3 (three) times daily as needed (pain).   guaiFENesin 600 MG 12 hr tablet Commonly known as:  MUCINEX Take 1 tablet (600 mg total) by mouth 2 (two) times daily.   losartan 50 MG tablet Commonly known as:  COZAAR Take 50 mg by mouth daily with supper.   metFORMIN 500 MG tablet Commonly known as:  GLUCOPHAGE Take 500 mg by mouth daily with breakfast.   oxyCODONE-acetaminophen 10-325 MG tablet Commonly known as:  PERCOCET Take 1 tablet by mouth every 6 (six) hours as needed for pain.   prednisoLONE acetate 1 % ophthalmic suspension Commonly known as:  PRED FORTE Place 1 drop into the right eye 2 (two) times daily.   sertraline 50 MG tablet Commonly known as:  ZOLOFT Take 50 mg by mouth daily.   simvastatin 20 MG tablet Commonly known as:  ZOCOR Take 20 mg by mouth daily.   telmisartan 80 MG tablet Commonly known as:  MICARDIS Take 80 mg by mouth daily.   valsartan 320 MG tablet Commonly known as:  DIOVAN Take 320 mg by mouth daily.   valsartan-hydrochlorothiazide 320-25 MG tablet Commonly known as:  DIOVAN-HCT Take 1 tablet by mouth daily.      Allergies  Allergen Reactions  . Robaxin [Methocarbamol] Rash    Red rash on right side of face and forehead cleared after benadryl given ( per [name] )  . Ancef [Cefazolin] Diarrhea  . Morphine And Related Itching  . Penicillins Itching    Tolerated Unasyn  . Flurbiprofen Diarrhea   Contact information for after-discharge care    Destination    [facility] .   Service:  Skilled Nursing Contact information: [place] [phone]               The results of significant diagnostics from this hospitalization (including imaging, microbiology, ancillary and laboratory) are listed below for reference.    Significant Diagnostic Studies: Dg Chest 2 View  Result Date: 10/20/2017 CLINICAL DATA:  Cough. EXAM: CHEST - 2 VIEW COMPARISON:  01/15/2017. FINDINGS: Mediastinum and hilar structures normal. Heart size stable. No pulmonary venous congestion. Low lung volumes with mild bibasilar infiltrates/edema and small bilateral pleural effusions. Right shoulder replacement. Midthoracic spine vertebral body compression fracture. IMPRESSION: 1. Low lung volumes with mild bibasilar infiltrates/edema  and small bilateral pleural effusions. 2.  Midthoracic spine vertebral body compression fracture. Electronically Signed   By: Marcello Moores  Register   On: 10/20/2017 10:40   Ct Abdomen Pelvis W Contrast  Result Date: 10/14/2017 CLINICAL DATA:  84 year old female with acute generalized abdominal pain. EXAM: CT ABDOMEN AND PELVIS WITH CONTRAST TECHNIQUE: Multidetector CT imaging of the abdomen and pelvis was performed using the standard protocol following bolus administration of intravenous contrast. CONTRAST:  117mL ISOVUE-300 IOPAMIDOL (ISOVUE-300) INJECTION 61% COMPARISON:  None. FINDINGS: Lower chest: The visualized lung bases are clear. No intra-abdominal free air or free fluid. Hepatobiliary: Cholecystectomy.  The liver is unremarkable. Pancreas: Unremarkable. No pancreatic ductal dilatation or surrounding inflammatory changes. Spleen: Normal in size without focal abnormality. Adrenals/Urinary Tract: The adrenal glands are unremarkable. There is no hydronephrosis on either side. The visualized ureters and urinary bladder appear unremarkable. Stomach/Bowel: There is extensive sigmoid diverticulosis with muscular hypertrophy. Mild thickened appearance of the descending colon, likely related to  underdistention. Colitis is less likely. Clinical correlation is recommended. There is no bowel obstruction. Appendectomy. Vascular/Lymphatic: Mild aortoiliac atherosclerotic disease. The origins of the celiac axis, SMA, IMA remain patent. No portal venous gas. There is no adenopathy. Reproductive: The uterus is not visualized, likely atrophic or surgically absent. No pelvic mass. Other: None Musculoskeletal: Total left hip arthroplasty. There is osteopenia with scoliosis and degenerative changes of the spine. Multilevel old-appearing compression deformities. No acute osseous pathology. IMPRESSION: 1. Underdistention of the descending colon versus mild colitis. Clinical correlation is recommended. 2. Sigmoid diverticulosis.  No bowel obstruction. 3.  Aortic Atherosclerosis (ICD10-I70.0). Electronically Signed   By: Anner Crete M.D.   On: 10/14/2017 03:12    Microbiology: Recent Results (from the past 240 hour(s))  Urine culture     Status: None   Collection Time: 10/14/17  9:20 PM  Result Value Ref Range Status   Specimen Description URINE, CLEAN CATCH  Final   Special Requests NONE  Final   Culture   Final    NO GROWTH Performed at [facility], [address] [place], [place] [address]    Report Status 10/16/2017 FINAL  Final  Gastrointestinal Panel by PCR , Stool     Status: None   Collection Time: 10/17/17 11:50 AM  Result Value Ref Range Status   Campylobacter species NOT DETECTED NOT DETECTED Final   Plesimonas shigelloides NOT DETECTED NOT DETECTED Final   Salmonella species NOT DETECTED NOT DETECTED Final   Yersinia enterocolitica NOT DETECTED NOT DETECTED Final   Vibrio species NOT DETECTED NOT DETECTED Final   Vibrio cholerae NOT DETECTED NOT DETECTED Final   Enteroaggregative E coli (EAEC) NOT DETECTED NOT DETECTED Final   Enteropathogenic E coli (EPEC) NOT DETECTED NOT DETECTED Final   Enterotoxigenic E coli (ETEC) NOT DETECTED NOT DETECTED Final   Shiga like toxin  producing E coli (STEC) NOT DETECTED NOT DETECTED Final   Shigella/Enteroinvasive E coli (EIEC) NOT DETECTED NOT DETECTED Final   Cryptosporidium NOT DETECTED NOT DETECTED Final   Cyclospora cayetanensis NOT DETECTED NOT DETECTED Final   Entamoeba histolytica NOT DETECTED NOT DETECTED Final   Giardia lamblia NOT DETECTED NOT DETECTED Final   Adenovirus F40/41 NOT DETECTED NOT DETECTED Final   Astrovirus NOT DETECTED NOT DETECTED Final   Norovirus GI/GII NOT DETECTED NOT DETECTED Final   Rotavirus A NOT DETECTED NOT DETECTED Final   Sapovirus (I, II, IV, and V) NOT DETECTED NOT DETECTED Final    Comment: Performed at [facility],  [place], [place] [address]     Labs: Basic Metabolic Panel: Recent Labs  Lab 10/16/17 0912 10/17/17 1023 10/18/17 1024 10/19/17 0429 10/20/17 0848  NA 135 131* 129* 134* 133*  K 3.1* 2.8* 2.3* 3.8 3.3*  CL 106 102 99* 102 101  CO2 22 17* 20* 21* 22  GLUCOSE 125* 171* 196* 106* 100*  BUN <5* <5* <5* <5* 6  CREATININE 0.49 0.61 0.63 0.54 0.56  CALCIUM 8.2* 7.8* 7.8* 7.9* 8.1*  MG  --   --  1.3*  --   --    Liver Function Tests: Recent Labs  Lab 10/14/17 0015 10/16/17 0912 10/17/17 1023 10/20/17 0848  AST 32 26 29 31   ALT 23 22 22 23   ALKPHOS 58 62 56 60  BILITOT 0.6 0.2* 0.4 0.3  PROT 6.2* 5.6* 5.3* 5.4*  ALBUMIN 3.5 3.0* 3.0* 3.0*   Recent Labs  Lab 10/14/17 0015  LIPASE 38   No results for input(s): AMMONIA in the last 168 hours. CBC: Recent Labs  Lab 10/14/17 0015  10/14/17 1529 10/15/17 0640 10/16/17 0912 10/17/17 1023 10/20/17 0848  WBC 12.0*   < > 11.6* 9.2 8.3 5.6 3.9*  NEUTROABS 9.4*  --   --   --  6.6 4.0 2.1  HGB 10.9*   < > 10.9* 10.6* 10.7* 10.9* 11.8*  HCT 33.9*   < > 33.5* 33.3* 33.6* 33.1* 36.4  MCV 93.4   < > 93.3 94.3 94.9 95.1 93.6  PLT 447*   < > 440* 435* 395 329 281   < > = values in this interval not displayed.   Cardiac Enzymes: Recent Labs  Lab 10/14/17 0015  10/14/17 0722 10/14/17 1529 10/14/17 2331  TROPONINI <0.03 <0.03 <0.03 <0.03   BNP: BNP (last 3 results) No results for input(s): BNP in the last 8760 hours.  ProBNP (last 3 results) No results for input(s): PROBNP in the last 8760 hours.  CBG: Recent Labs  Lab 10/19/17 2003 10/20/17 0006 10/20/17 0339 10/20/17 0810 10/20/17 1122  GLUCAP 142* 89 74 113* 206*       SignedBarbette Merino MD.  [facility] 10/20/2017, 2:17 PM

## 2017-10-20 NOTE — Progress Notes (Signed)
Gave Pt Incentive Spirometer and educated her on how to use,

## 2017-10-20 NOTE — Progress Notes (Signed)
Report called to [facility],  Pt family to be given Discharge instructions along with her prescription for Xanax to be given to [facility] on her arrival at facility.  Pt to be driven by family to facility. Pt has her belongings with her and will take with her to facility.

## 2017-10-20 NOTE — Social Work (Signed)
Clinical Social Worker facilitated patient discharge including contacting patient family and facility to confirm patient discharge plans.  Clinical information faxed to facility and family agreeable with plan.  CSW arranged ambulance transport via [facility] to [facility] [address].  RN to call report to [phone]  Clinical Social Worker will sign off for now as social work intervention is no longer needed. Please consult [place] again if new need arises.  Alexander Mt, [place] Social Worker

## 2017-10-20 NOTE — Progress Notes (Signed)
PT Cancellation Note  Patient Details Name: SUMAYYA MUHA MRN: [id number] DOB: 1925-01-17   Cancelled Treatment:    Reason Eval/Treat Not Completed: Patient at procedure or test/unavailable. Transport just arrived to take pt to xray. PT to re-attempt as time allows.   Lorriane Shire 10/20/2017, 10:13 AM   Lorrin Goodell, PT  Office # [phone] Pager [phone]

## 2017-10-20 NOTE — Progress Notes (Signed)
Physical Therapy Treatment Patient Details Name: Marissa Ryan MRN: [id number] DOB: 1925-04-01 Today's Date: 10/20/2017    History of Present Illness Marissa Ryan is a 84 y.o. female with a Past Medical History significant for htn, hld, OA, anxiety who presents with R abd pain. She has history of IBS as well as possible colitis on CT has anxiety disorder. Has acute on chronic low back pain.    PT Comments    Session limited to in room as pt is perseverating on need to urinate. She reports fatigue due to getting on/off BSC all morning. She required min assist transfers and min guard assist ambulation 15 feet with RW. Current POC and recommendations remains appropriate.    Follow Up Recommendations  SNF;Supervision/Assistance - 24 hour     Equipment Recommendations  None recommended by PT    Recommendations for Other Services       Precautions / Restrictions Precautions Precautions: Fall;Other (comment) Precaution Comments: left wrist brace due to recent sprain per pt Restrictions Weight Bearing Restrictions: No    Mobility  Bed Mobility               General bed mobility comments: Pt OOB on arrival.  Transfers Overall transfer level: Needs assistance Equipment used: Ambulation equipment used Transfers: Stand Pivot Transfers Sit to Stand: Min assist Stand pivot transfers: Min assist       General transfer comment: assist to stabilize standing balance  Ambulation/Gait Ambulation/Gait assistance: Min guard Ambulation Distance (Feet): 15 Feet Assistive device: Rolling walker (2 wheeled) Gait Pattern/deviations: Step-through pattern;Decreased stride length;Trunk flexed;Drifts right/left Gait velocity: decreased   General Gait Details: Ambulation limited to in room due to pt perseverating on the need to urinate. She has been on/off the BSC all morning.   Stairs            Wheelchair Mobility    Modified Rankin (Stroke Patients Only)       Balance                                             Cognition Arousal/Alertness: Awake/alert Behavior During Therapy: Anxious Overall Cognitive Status: Impaired/Different from baseline Area of Impairment: Problem solving;Safety/judgement                         Safety/Judgement: Decreased awareness of safety;Decreased awareness of deficits   Problem Solving: Slow processing;Requires verbal cues General Comments: gets frustrated easily during problem solving. mild confusion      Exercises      General Comments        Pertinent Vitals/Pain Pain Assessment: Faces Faces Pain Scale: No hurt    Home Living Family/patient expects to be discharged to:: Private residence Living Arrangements: Alone                  Prior Function            PT Goals (current goals can now be found in the care plan section) Acute Rehab PT Goals Patient Stated Goal: to get better PT Goal Formulation: With patient Time For Goal Achievement: 10/29/17 Potential to Achieve Goals: Good Progress towards PT goals: Progressing toward goals    Frequency    Min 3X/week      PT Plan Current plan remains appropriate    Co-evaluation  AM-PAC PT "6 Clicks" Daily Activity  Outcome Measure  Difficulty turning over in bed (including adjusting bedclothes, sheets and blankets)?: A Little Difficulty moving from lying on back to sitting on the side of the bed? : A Little Difficulty sitting down on and standing up from a chair with arms (e.g., wheelchair, bedside commode, etc,.)?: A Little Help needed moving to and from a bed to chair (including a wheelchair)?: A Little Help needed walking in hospital room?: A Little Help needed climbing 3-5 steps with a railing? : A Little 6 Click Score: 18    End of Session Equipment Utilized During Treatment: Gait belt Activity Tolerance: Patient limited by fatigue Patient left: in chair;with call bell/phone within  reach Nurse Communication: Mobility status PT Visit Diagnosis: Muscle weakness (generalized) (M62.81);Unsteadiness on feet (R26.81);History of falling (Z91.81)     Time: 7579-7282 PT Time Calculation (min) (ACUTE ONLY): 11 min  Charges:  $Therapeutic Activity: 8-22 mins                    G Codes:       Lorrin Goodell, PT  Office # [phone] Pager [phone]    Lorriane Shire 10/20/2017, 12:22 PM

## 2017-10-20 NOTE — Clinical Social Work Placement (Addendum)
   CLINICAL SOCIAL WORK PLACEMENT  NOTE [facility] RN to call report to [phone]  Date:  10/20/2017  Patient Details  Name: Marissa Ryan MRN: [id number] Date of Birth: 12/19/1924  Clinical Social Work is seeking post-discharge placement for this patient at the [place] level of care (*CSW will initial, date and re-position this form in  chart as items are completed):  Yes   Patient/family provided with [facility] Work Department's list of facilities offering this level of care within the geographic area requested by the patient (or if unable, by the patient's family).  Yes   Patient/family informed of their freedom to choose among providers that offer the needed level of care, that participate in Medicare, Medicaid or managed care program needed by the patient, have an available bed and are willing to accept the patient.  Yes   Patient/family informed of [facility]'s ownership interest in [facility] and [facility], as well as of the fact that they are under no obligation to receive care at these facilities.  PASRR submitted to EDS on       PASRR number received on       Existing PASRR number confirmed on 10/17/17     FL2 transmitted to all facilities in geographic area requested by pt/family on 10/17/17     FL2 transmitted to all facilities within larger geographic area on       Patient informed that his/her managed care company has contracts with or will negotiate with certain facilities, including the following:        Yes   Patient/family informed of bed offers received.  Patient chooses bed at [place], [place]     Physician recommends and patient chooses bed at      Patient to be transferred to [place] on 10/20/17.  Patient to be transferred to facility by personal car- daughters     Patient family notified on 10/20/17 of transfer.  Name of family member notified:  pt and pt  granddaughter [name] at bedside.      PHYSICIAN Please prepare priority discharge summary, including medications     Additional Comment:    _______________________________________________ Alexander Mt, LCSWA 10/20/2017, 12:25 PM

## 2017-10-23 DIAGNOSIS — M810 Age-related osteoporosis without current pathological fracture: Secondary | ICD-10-CM | POA: Diagnosis not present

## 2017-10-23 DIAGNOSIS — E119 Type 2 diabetes mellitus without complications: Secondary | ICD-10-CM | POA: Diagnosis not present

## 2017-10-23 DIAGNOSIS — M545 Low back pain: Secondary | ICD-10-CM | POA: Diagnosis not present

## 2017-10-23 DIAGNOSIS — R05 Cough: Secondary | ICD-10-CM | POA: Diagnosis not present

## 2017-10-23 DIAGNOSIS — R2689 Other abnormalities of gait and mobility: Secondary | ICD-10-CM | POA: Diagnosis not present

## 2017-10-23 DIAGNOSIS — I10 Essential (primary) hypertension: Secondary | ICD-10-CM | POA: Diagnosis not present

## 2017-10-23 DIAGNOSIS — K529 Noninfective gastroenteritis and colitis, unspecified: Secondary | ICD-10-CM | POA: Diagnosis not present

## 2017-10-25 DIAGNOSIS — L89152 Pressure ulcer of sacral region, stage 2: Secondary | ICD-10-CM | POA: Diagnosis not present

## 2017-10-27 ENCOUNTER — Telehealth: Payer: Self-pay | Admitting: Gastroenterology

## 2017-10-27 NOTE — Telephone Encounter (Signed)
Hi Dr. Fuller Plan, we have received records from [facility] requesting pt to be seen for IBS. Pt's MD at nursing home is Dr. Leanna Battles. Contact number [phone]. You have not seen pt since 2012. Records have been placed on your desk for review. Thank you.

## 2017-10-28 ENCOUNTER — Encounter: Payer: Self-pay | Admitting: Gastroenterology

## 2017-10-28 NOTE — Telephone Encounter (Signed)
Dr. Derrill Memo reviewed records. Ok to schedule pt for OV. Pt is scheduled for 11/18/17 at 1:30pm.

## 2017-11-01 DIAGNOSIS — L89152 Pressure ulcer of sacral region, stage 2: Secondary | ICD-10-CM | POA: Diagnosis not present

## 2017-11-09 DIAGNOSIS — M1611 Unilateral primary osteoarthritis, right hip: Secondary | ICD-10-CM | POA: Diagnosis not present

## 2017-11-09 DIAGNOSIS — M5136 Other intervertebral disc degeneration, lumbar region: Secondary | ICD-10-CM | POA: Diagnosis not present

## 2017-11-09 DIAGNOSIS — M8008XD Age-related osteoporosis with current pathological fracture, vertebra(e), subsequent encounter for fracture with routine healing: Secondary | ICD-10-CM | POA: Diagnosis not present

## 2017-11-09 DIAGNOSIS — E119 Type 2 diabetes mellitus without complications: Secondary | ICD-10-CM | POA: Diagnosis not present

## 2017-11-09 DIAGNOSIS — H18221 Idiopathic corneal edema, right eye: Secondary | ICD-10-CM | POA: Diagnosis not present

## 2017-11-09 DIAGNOSIS — H5711 Ocular pain, right eye: Secondary | ICD-10-CM | POA: Diagnosis not present

## 2017-11-09 DIAGNOSIS — I10 Essential (primary) hypertension: Secondary | ICD-10-CM | POA: Diagnosis not present

## 2017-11-09 DIAGNOSIS — K589 Irritable bowel syndrome without diarrhea: Secondary | ICD-10-CM | POA: Diagnosis not present

## 2017-11-09 DIAGNOSIS — F418 Other specified anxiety disorders: Secondary | ICD-10-CM | POA: Diagnosis not present

## 2017-11-09 DIAGNOSIS — Z961 Presence of intraocular lens: Secondary | ICD-10-CM | POA: Diagnosis not present

## 2017-11-09 DIAGNOSIS — H353122 Nonexudative age-related macular degeneration, left eye, intermediate dry stage: Secondary | ICD-10-CM | POA: Diagnosis not present

## 2017-11-09 DIAGNOSIS — M5030 Other cervical disc degeneration, unspecified cervical region: Secondary | ICD-10-CM | POA: Diagnosis not present

## 2017-11-09 DIAGNOSIS — K529 Noninfective gastroenteritis and colitis, unspecified: Secondary | ICD-10-CM | POA: Diagnosis not present

## 2017-11-09 DIAGNOSIS — G8929 Other chronic pain: Secondary | ICD-10-CM | POA: Diagnosis not present

## 2017-11-09 DIAGNOSIS — L89152 Pressure ulcer of sacral region, stage 2: Secondary | ICD-10-CM | POA: Diagnosis not present

## 2017-11-09 DIAGNOSIS — D6489 Other specified anemias: Secondary | ICD-10-CM | POA: Diagnosis not present

## 2017-11-10 DIAGNOSIS — K589 Irritable bowel syndrome without diarrhea: Secondary | ICD-10-CM | POA: Diagnosis not present

## 2017-11-10 DIAGNOSIS — K529 Noninfective gastroenteritis and colitis, unspecified: Secondary | ICD-10-CM | POA: Diagnosis not present

## 2017-11-11 DIAGNOSIS — K589 Irritable bowel syndrome without diarrhea: Secondary | ICD-10-CM | POA: Diagnosis not present

## 2017-11-11 DIAGNOSIS — K529 Noninfective gastroenteritis and colitis, unspecified: Secondary | ICD-10-CM | POA: Diagnosis not present

## 2017-11-14 DIAGNOSIS — K589 Irritable bowel syndrome without diarrhea: Secondary | ICD-10-CM | POA: Diagnosis not present

## 2017-11-14 DIAGNOSIS — K529 Noninfective gastroenteritis and colitis, unspecified: Secondary | ICD-10-CM | POA: Diagnosis not present

## 2017-11-15 DIAGNOSIS — K529 Noninfective gastroenteritis and colitis, unspecified: Secondary | ICD-10-CM | POA: Diagnosis not present

## 2017-11-15 DIAGNOSIS — K589 Irritable bowel syndrome without diarrhea: Secondary | ICD-10-CM | POA: Diagnosis not present

## 2017-11-16 DIAGNOSIS — E119 Type 2 diabetes mellitus without complications: Secondary | ICD-10-CM | POA: Diagnosis not present

## 2017-11-16 DIAGNOSIS — K529 Noninfective gastroenteritis and colitis, unspecified: Secondary | ICD-10-CM | POA: Diagnosis not present

## 2017-11-16 DIAGNOSIS — M5489 Other dorsalgia: Secondary | ICD-10-CM | POA: Diagnosis not present

## 2017-11-16 DIAGNOSIS — M810 Age-related osteoporosis without current pathological fracture: Secondary | ICD-10-CM | POA: Diagnosis not present

## 2017-11-16 DIAGNOSIS — Z6823 Body mass index (BMI) 23.0-23.9, adult: Secondary | ICD-10-CM | POA: Diagnosis not present

## 2017-11-16 DIAGNOSIS — I10 Essential (primary) hypertension: Secondary | ICD-10-CM | POA: Diagnosis not present

## 2017-11-17 DIAGNOSIS — K589 Irritable bowel syndrome without diarrhea: Secondary | ICD-10-CM | POA: Diagnosis not present

## 2017-11-17 DIAGNOSIS — K529 Noninfective gastroenteritis and colitis, unspecified: Secondary | ICD-10-CM | POA: Diagnosis not present

## 2017-11-18 ENCOUNTER — Ambulatory Visit: Payer: Medicare Other | Admitting: Gastroenterology

## 2017-11-21 DIAGNOSIS — K589 Irritable bowel syndrome without diarrhea: Secondary | ICD-10-CM | POA: Diagnosis not present

## 2017-11-21 DIAGNOSIS — K529 Noninfective gastroenteritis and colitis, unspecified: Secondary | ICD-10-CM | POA: Diagnosis not present

## 2017-11-22 DIAGNOSIS — K589 Irritable bowel syndrome without diarrhea: Secondary | ICD-10-CM | POA: Diagnosis not present

## 2017-11-22 DIAGNOSIS — K529 Noninfective gastroenteritis and colitis, unspecified: Secondary | ICD-10-CM | POA: Diagnosis not present

## 2017-11-23 DIAGNOSIS — R6884 Jaw pain: Secondary | ICD-10-CM | POA: Diagnosis not present

## 2017-11-23 DIAGNOSIS — H9202 Otalgia, left ear: Secondary | ICD-10-CM | POA: Diagnosis not present

## 2017-11-24 DIAGNOSIS — K589 Irritable bowel syndrome without diarrhea: Secondary | ICD-10-CM | POA: Diagnosis not present

## 2017-11-24 DIAGNOSIS — K529 Noninfective gastroenteritis and colitis, unspecified: Secondary | ICD-10-CM | POA: Diagnosis not present

## 2017-11-26 ENCOUNTER — Emergency Department (HOSPITAL_COMMUNITY)
Admission: EM | Admit: 2017-11-26 | Discharge: 2017-11-26 | Disposition: A | Discharge status: Home / Self Care | Payer: Medicare Other | Attending: Emergency Medicine | Admitting: Emergency Medicine

## 2017-11-26 ENCOUNTER — Other Ambulatory Visit: Payer: Self-pay

## 2017-11-26 ENCOUNTER — Emergency Department (HOSPITAL_COMMUNITY): Payer: Medicare Other

## 2017-11-26 DIAGNOSIS — Z79899 Other long term (current) drug therapy: Secondary | ICD-10-CM | POA: Diagnosis not present

## 2017-11-26 DIAGNOSIS — Z7984 Long term (current) use of oral hypoglycemic drugs: Secondary | ICD-10-CM | POA: Insufficient documentation

## 2017-11-26 DIAGNOSIS — I10 Essential (primary) hypertension: Secondary | ICD-10-CM | POA: Insufficient documentation

## 2017-11-26 DIAGNOSIS — Z85828 Personal history of other malignant neoplasm of skin: Secondary | ICD-10-CM | POA: Insufficient documentation

## 2017-11-26 DIAGNOSIS — Z96642 Presence of left artificial hip joint: Secondary | ICD-10-CM | POA: Diagnosis not present

## 2017-11-26 DIAGNOSIS — R0789 Other chest pain: Secondary | ICD-10-CM | POA: Diagnosis not present

## 2017-11-26 DIAGNOSIS — R079 Chest pain, unspecified: Secondary | ICD-10-CM | POA: Diagnosis not present

## 2017-11-26 DIAGNOSIS — E119 Type 2 diabetes mellitus without complications: Secondary | ICD-10-CM | POA: Insufficient documentation

## 2017-11-26 DIAGNOSIS — Z7982 Long term (current) use of aspirin: Secondary | ICD-10-CM | POA: Diagnosis not present

## 2017-11-26 DIAGNOSIS — R1013 Epigastric pain: Secondary | ICD-10-CM | POA: Diagnosis not present

## 2017-11-26 LAB — CBC
HCT: 34.4 % — ABNORMAL LOW (ref 36.0–46.0)
Hemoglobin: 11 g/dL — ABNORMAL LOW (ref 12.0–15.0)
MCH: 30.8 pg (ref 26.0–34.0)
MCHC: 32 g/dL (ref 30.0–36.0)
MCV: 96.4 fL (ref 78.0–100.0)
Platelets: 332 10*3/uL (ref 150–400)
RBC: 3.57 MIL/uL — AB (ref 3.87–5.11)
RDW: 14.6 % (ref 11.5–15.5)
WBC: 11.1 10*3/uL — ABNORMAL HIGH (ref 4.0–10.5)

## 2017-11-26 LAB — I-STAT TROPONIN, ED
Troponin i, poc: 0.01 ng/mL (ref 0.00–0.08)
Troponin i, poc: 0.02 ng/mL (ref 0.00–0.08)

## 2017-11-26 LAB — LIPASE, BLOOD: Lipase: 33 U/L (ref 11–51)

## 2017-11-26 LAB — HEPATIC FUNCTION PANEL
ALBUMIN: 3.6 g/dL (ref 3.5–5.0)
ALT: 15 U/L (ref 14–54)
AST: 25 U/L (ref 15–41)
Alkaline Phosphatase: 62 U/L (ref 38–126)
Bilirubin, Direct: <0.1 mg/dL — ABNORMAL LOW (ref 0.1–0.5)
TOTAL PROTEIN: 6.2 g/dL — AB (ref 6.5–8.1)
Total Bilirubin: 0.5 mg/dL (ref 0.3–1.2)

## 2017-11-26 LAB — URINALYSIS, ROUTINE W REFLEX MICROSCOPIC
Bilirubin Urine: NEGATIVE
GLUCOSE, UA: NEGATIVE mg/dL
Hgb urine dipstick: NEGATIVE
Ketones, ur: NEGATIVE mg/dL
Leukocytes, UA: NEGATIVE
Nitrite: NEGATIVE
Protein, ur: NEGATIVE mg/dL
SPECIFIC GRAVITY, URINE: 1.011 (ref 1.005–1.030)
pH: 6 (ref 5.0–8.0)

## 2017-11-26 LAB — BASIC METABOLIC PANEL
Anion gap: 10 (ref 5–15)
BUN: 9 mg/dL (ref 6–20)
CHLORIDE: 102 mmol/L (ref 101–111)
CO2: 24 mmol/L (ref 22–32)
Calcium: 9.1 mg/dL (ref 8.9–10.3)
Creatinine, Ser: 0.77 mg/dL (ref 0.44–1.00)
GFR calc Af Amer: >60 mL/min (ref >60)
GFR calc non Af Amer: >60 mL/min (ref >60)
GLUCOSE: 133 mg/dL — AB (ref 65–99)
POTASSIUM: 3.8 mmol/L (ref 3.5–5.1)
Sodium: 136 mmol/L (ref 135–145)

## 2017-11-26 LAB — D-DIMER, QUANTITATIVE: D-Dimer, Quant: 1.35 ug/mL-FEU — ABNORMAL HIGH (ref 0.00–0.50)

## 2017-11-26 MED ORDER — OXYCODONE-ACETAMINOPHEN 5-325 MG PO TABS
1.0000 | ORAL_TABLET | Freq: Once | ORAL | Status: AC
  Administered 2017-11-26: 1 via ORAL
  Filled 2017-11-26: qty 1

## 2017-11-26 MED ORDER — GI COCKTAIL ~~LOC~~
30.0000 mL | Freq: Once | ORAL | Status: AC
  Administered 2017-11-26: 30 mL via ORAL
  Filled 2017-11-26: qty 30

## 2017-11-26 NOTE — ED Triage Notes (Signed)
Patient stated chest pain starting at approx 0600 today. Initial B/P on EMS arrival was 170/70. EMS gave 3 nitro with no pain relief and 324 ASA.

## 2017-11-26 NOTE — ED Notes (Signed)
Pt ambulatory to restroom with assistance. Pt back in bed resting at this time.

## 2017-11-26 NOTE — ED Provider Notes (Signed)
[facility] EMERGENCY DEPARTMENT Provider Note   CSN: [id number] Arrival date & time: 11/26/17  1317     History   Chief Complaint Chief Complaint  Patient presents with  . Chest Pain    HPI Marissa Ryan is a 84 y.o. female.  Pt presents to the ED today with cp.  She said it woke her up from sleep around 0600.  The pt said the pain has worsened throughout the day.  The pt was given asa and 3 nitro which did not help her pain.  Pt thinks pain is coming from her compression fracture in her back.  Pt has chronic pain for that problem and takes oxycodone.  Pt was admitted in march for similar pain, but CT showed colitis then.  Pt has not had any diarrhea or colitis sx today.     Past Medical History:  Diagnosis Date  . Anxiety   . Arthritis   . Cancer ([place])    hx of skin cancer removal on hand   . Colitis   . Diabetes mellitus without complication ([place])   . Gallstones   . Hyperlipidemia   . Hypertension   . IBS (irritable bowel syndrome)   . Osteoporosis     Patient Active Problem List   Diagnosis Date Noted  . Pressure injury of skin 10/15/2017  . Abdominal pain 10/14/2017  . IBS (irritable bowel syndrome) 10/14/2017  . Anxiety 10/14/2017  . Anemia 10/14/2017  . 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 OA (osteoarthritis) of hip 02/20/2014  . Essential hypertension 12/03/2013  . Hyperlipidemia 12/03/2013  . Diabetes ([place]) 12/03/2013    Past Surgical History:  Procedure Laterality Date  . APPENDECTOMY    . BACK SURGERY     x2  . cataract surgery      bilateral   . CHOLECYSTECTOMY  1999  . EYE SURGERY     for glaucoma   . left eye peeling surgery     . left femur bone surgery      1978  . REVERSE SHOULDER ARTHROPLASTY Right 10/10/2014   Procedure: RIGHT REVERSE SHOULDER ARTHROPLASTY;  Surgeon: Justice Britain, MD;  Location: [facility];  Service: Orthopedics;  Laterality: Right;  . ROTATOR CUFF REPAIR    . TOTAL HIP ARTHROPLASTY  Left 02/20/2014   Procedure: LEFT TOTAL HIP ARTHROPLASTY ANTERIOR APPROACH;  Surgeon: Gearlean Alf, MD;  Location: [facility];  Service: Orthopedics;  Laterality: Left;  . WRIST SURGERY     left      OB History   None      Home Medications    Prior to Admission medications   Medication Sig Start Date End Date Taking? Authorizing Provider  ALPRAZolam (XANAX) 0.25 MG tablet Take 1 tablet (0.25 mg total) by mouth at bedtime. 10/20/17   Elwyn Reach, MD  amLODipine (NORVASC) 5 MG tablet Take 5 mg by mouth daily.  12/30/16   [provider]  aspirin EC 81 MG tablet Take 81 mg by mouth daily.    [provider]  carboxymethylcellulose (REFRESH PLUS) 0.5 % SOLN 1 drop every hour as needed.    [provider]  Cinnamon 500 MG capsule Take 500 mg by mouth daily.    [provider]  docusate sodium 100 MG CAPS Take 100 mg by mouth 2 (two) times daily. Patient taking differently: Take 100 mg by mouth daily as needed (constipation).  02/25/14   Perkins, Alexzandrew L, PA-C  furosemide (LASIX)  20 MG tablet Take 20 mg by mouth daily.     [provider]  gabapentin (NEURONTIN) 300 MG capsule Take 300 mg by mouth 3 (three) times daily as needed (pain).     [provider]  guaiFENesin (MUCINEX) 600 MG 12 hr tablet Take 1 tablet (600 mg total) by mouth 2 (two) times daily. 10/20/17   Elwyn Reach, MD  losartan (COZAAR) 50 MG tablet Take 50 mg by mouth daily with supper.     [provider]  metFORMIN (GLUCOPHAGE) 500 MG tablet Take 500 mg by mouth daily with breakfast.    [provider]  oxyCODONE-acetaminophen (PERCOCET) 10-325 MG tablet Take 1 tablet by mouth every 6 (six) hours as needed for pain. 10/20/17   Elwyn Reach, MD  prednisoLONE acetate (PRED FORTE) 1 % ophthalmic suspension Place 1 drop into the right eye 2 (two) times daily.    [provider]  sertraline (ZOLOFT) 50 MG tablet Take 50 mg by mouth daily.  08/27/17   [provider]  simvastatin (ZOCOR) 20 MG tablet Take 20 mg by mouth daily. 01/31/17   [provider]  telmisartan (MICARDIS) 80 MG tablet Take 80 mg by mouth daily. 10/12/17   [provider]  valsartan (DIOVAN) 320 MG tablet Take 320 mg by mouth daily.  01/10/17   [provider]  valsartan-hydrochlorothiazide (DIOVAN-HCT) 320-25 MG tablet Take 1 tablet by mouth daily.    [provider]    Family History Family History  Problem Relation Age of Onset  . Stroke Mother   . Hypertension Mother   . Ovarian cancer Sister     Social History Social History   Tobacco Use  . Smoking status: Never Smoker  . Smokeless tobacco: Never Used  Substance Use Topics  . Alcohol use: Yes    Comment: rare glass of wine   . Drug use: No     Allergies   Robaxin [methocarbamol]; Ancef [cefazolin]; Morphine and related; Penicillins; and Flurbiprofen   Review of Systems Review of Systems  Cardiovascular: Positive for chest pain.  All other systems reviewed and are negative.    Physical Exam Updated Vital Signs BP (!) 153/106   Pulse 75   Temp 98.1 F (36.7 C) (Oral)   Resp 18   Ht 5\' 2"  (1.575 m)   Wt 59 kg (130 lb)   SpO2 98%   BMI 23.78 kg/m   Physical Exam  Constitutional: She is oriented to person, place, and time. She appears well-developed and well-nourished.  HENT:  Head: Normocephalic and atraumatic.  Eyes: Pupils are equal, round, and reactive to light. EOM are normal.  Neck: Normal range of motion. Neck supple.  Cardiovascular: Normal rate, regular rhythm, intact distal pulses and normal pulses.  Pulmonary/Chest: Effort normal and breath sounds normal.  Abdominal: Soft. Bowel sounds are normal. There is tenderness in the epigastric area.  Musculoskeletal: Normal range of motion.       Right lower leg: Normal.       Left lower leg: Normal.  Neurological: She is alert and oriented to person, place, and time.  Skin:  Skin is warm. Capillary refill takes less than 2 seconds.  Psychiatric: She has a normal mood and affect. Her behavior is normal.  Nursing note and vitals reviewed.    ED Treatments / Results  Labs (all labs ordered are listed, but only abnormal results are displayed) Labs Reviewed  BASIC METABOLIC PANEL - Abnormal; Notable for the following  components:      Result Value   Glucose, Bld 133 (*)    All other components within normal limits  CBC - Abnormal; Notable for the following components:   WBC 11.1 (*)    RBC 3.57 (*)    Hemoglobin 11.0 (*)    HCT 34.4 (*)    All other components within normal limits  HEPATIC FUNCTION PANEL - Abnormal; Notable for the following components:   Total Protein 6.2 (*)    Bilirubin, Direct <0.1 (*)    All other components within normal limits  LIPASE, BLOOD  URINALYSIS, ROUTINE W REFLEX MICROSCOPIC  D-DIMER, QUANTITATIVE (NOT AT [facility])  I-STAT TROPONIN, ED    EKG EKG Interpretation  Date/Time:  Saturday Nov 26 2017 13:21:17 EDT Ventricular Rate:  89 PR Interval:    QRS Duration: 78 QT Interval:  353 QTC Calculation: 430 R Axis:   -9 Text Interpretation:  Normal sinus rhythm Atrial premature complexes Confirmed by Isla Pence [phone]) on 11/26/2017 2:27:45 PM   Radiology Dg Chest 2 View  Result Date: 11/26/2017 CLINICAL DATA:  Chest pain beginning at 6 o'clock this morning EXAM: CHEST - 2 VIEW COMPARISON:  10/20/2017; 11/16/2026; 728/2015 FINDINGS: Grossly unchanged cardiac silhouette and mediastinal contours. Improved aeration of lung bases with near complete resolution of bilateral effusions. Suspected trace residual left-sided effusion. The lungs remain hyperexpanded with flattening the diaphragms mild diffuse slightly nodular thickening of the pulmonary interstitium. Mild pulmonary is congestion without frank evidence of edema. No acute osseus abnormalities. Unchanged severe compression deformity of the T8 vertebral body and mild  compression deformity of the T7 vertebral body. Post left total shoulder replacement, incompletely evaluated. Post cholecystectomy. IMPRESSION: 1.  No acute cardiopulmonary disease. 2. Improved aeration of the lungs with suspected residual trace left-sided effusion. Electronically Signed   By: Sandi Mariscal M.D.   On: 11/26/2017 13:46    Procedures Procedures (including critical care time)  Medications Ordered in ED Medications  gi cocktail (Maalox,Lidocaine,Donnatal) (30 mLs Oral Given 11/26/17 1522)  oxyCODONE-acetaminophen (PERCOCET/ROXICET) 5-325 MG per tablet 1 tablet (1 tablet Oral Given 11/26/17 1522)     Initial Impression / Assessment and Plan / ED Course  I have reviewed the triage vital signs and the nursing notes.  Pertinent labs & imaging results that were available during my care of the patient were reviewed by me and considered in my medical decision making (see chart for details).    Pt will be signed out to Dr. Jeanell Sparrow at shift change.  Pain is atypical for CAD.  Final Clinical Impressions(s) / ED Diagnoses   Final diagnoses:  Atypical chest pain  Epigastric abdominal pain    ED Discharge Orders    None       Isla Pence, MD 11/26/17 1547

## 2017-11-26 NOTE — ED Provider Notes (Signed)
Patient handoff from Dr. Gilford Raid.  Patient has epigastric pain.  Repeat troponin pending.  Plan is to discharge if normal.   Pattricia Boss, MD 11/28/17 0001

## 2017-11-26 NOTE — Discharge Instructions (Addendum)
Recheck with your doctor tomorrow Return if you are worse at any time

## 2017-11-28 DIAGNOSIS — S8001XA Contusion of right knee, initial encounter: Secondary | ICD-10-CM | POA: Diagnosis not present

## 2017-11-28 DIAGNOSIS — S80211A Abrasion, right knee, initial encounter: Secondary | ICD-10-CM | POA: Diagnosis not present

## 2017-11-28 DIAGNOSIS — M25561 Pain in right knee: Secondary | ICD-10-CM | POA: Diagnosis not present

## 2017-11-29 DIAGNOSIS — K589 Irritable bowel syndrome without diarrhea: Secondary | ICD-10-CM | POA: Diagnosis not present

## 2017-11-29 DIAGNOSIS — K529 Noninfective gastroenteritis and colitis, unspecified: Secondary | ICD-10-CM | POA: Diagnosis not present

## 2017-11-30 DIAGNOSIS — K589 Irritable bowel syndrome without diarrhea: Secondary | ICD-10-CM | POA: Diagnosis not present

## 2017-11-30 DIAGNOSIS — K529 Noninfective gastroenteritis and colitis, unspecified: Secondary | ICD-10-CM | POA: Diagnosis not present

## 2017-12-01 DIAGNOSIS — K589 Irritable bowel syndrome without diarrhea: Secondary | ICD-10-CM | POA: Diagnosis not present

## 2017-12-01 DIAGNOSIS — K529 Noninfective gastroenteritis and colitis, unspecified: Secondary | ICD-10-CM | POA: Diagnosis not present

## 2017-12-02 ENCOUNTER — Encounter (INDEPENDENT_AMBULATORY_CARE_PROVIDER_SITE_OTHER): Payer: Medicare Other | Admitting: Ophthalmology

## 2017-12-02 DIAGNOSIS — E113292 Type 2 diabetes mellitus with mild nonproliferative diabetic retinopathy without macular edema, left eye: Secondary | ICD-10-CM

## 2017-12-02 DIAGNOSIS — E11311 Type 2 diabetes mellitus with unspecified diabetic retinopathy with macular edema: Secondary | ICD-10-CM | POA: Diagnosis not present

## 2017-12-02 DIAGNOSIS — E113211 Type 2 diabetes mellitus with mild nonproliferative diabetic retinopathy with macular edema, right eye: Secondary | ICD-10-CM | POA: Diagnosis not present

## 2017-12-02 DIAGNOSIS — H59031 Cystoid macular edema following cataract surgery, right eye: Secondary | ICD-10-CM

## 2017-12-02 DIAGNOSIS — H43813 Vitreous degeneration, bilateral: Secondary | ICD-10-CM | POA: Diagnosis not present

## 2017-12-06 DIAGNOSIS — K589 Irritable bowel syndrome without diarrhea: Secondary | ICD-10-CM | POA: Diagnosis not present

## 2017-12-06 DIAGNOSIS — K529 Noninfective gastroenteritis and colitis, unspecified: Secondary | ICD-10-CM | POA: Diagnosis not present

## 2017-12-07 DIAGNOSIS — K589 Irritable bowel syndrome without diarrhea: Secondary | ICD-10-CM | POA: Diagnosis not present

## 2017-12-07 DIAGNOSIS — K529 Noninfective gastroenteritis and colitis, unspecified: Secondary | ICD-10-CM | POA: Diagnosis not present

## 2017-12-08 DIAGNOSIS — K589 Irritable bowel syndrome without diarrhea: Secondary | ICD-10-CM | POA: Diagnosis not present

## 2017-12-08 DIAGNOSIS — M810 Age-related osteoporosis without current pathological fracture: Secondary | ICD-10-CM | POA: Diagnosis not present

## 2017-12-08 DIAGNOSIS — K529 Noninfective gastroenteritis and colitis, unspecified: Secondary | ICD-10-CM | POA: Diagnosis not present

## 2017-12-08 DIAGNOSIS — Z6822 Body mass index (BMI) 22.0-22.9, adult: Secondary | ICD-10-CM | POA: Diagnosis not present

## 2017-12-08 DIAGNOSIS — R030 Elevated blood-pressure reading, without diagnosis of hypertension: Secondary | ICD-10-CM | POA: Diagnosis not present

## 2017-12-13 DIAGNOSIS — K589 Irritable bowel syndrome without diarrhea: Secondary | ICD-10-CM | POA: Diagnosis not present

## 2017-12-13 DIAGNOSIS — K529 Noninfective gastroenteritis and colitis, unspecified: Secondary | ICD-10-CM | POA: Diagnosis not present

## 2017-12-14 DIAGNOSIS — K529 Noninfective gastroenteritis and colitis, unspecified: Secondary | ICD-10-CM | POA: Diagnosis not present

## 2017-12-14 DIAGNOSIS — K589 Irritable bowel syndrome without diarrhea: Secondary | ICD-10-CM | POA: Diagnosis not present

## 2017-12-19 DIAGNOSIS — Z79891 Long term (current) use of opiate analgesic: Secondary | ICD-10-CM | POA: Diagnosis not present

## 2017-12-19 DIAGNOSIS — G894 Chronic pain syndrome: Secondary | ICD-10-CM | POA: Diagnosis not present

## 2017-12-20 DIAGNOSIS — H353122 Nonexudative age-related macular degeneration, left eye, intermediate dry stage: Secondary | ICD-10-CM | POA: Diagnosis not present

## 2017-12-23 DIAGNOSIS — S8001XA Contusion of right knee, initial encounter: Secondary | ICD-10-CM | POA: Diagnosis not present

## 2017-12-23 DIAGNOSIS — M1711 Unilateral primary osteoarthritis, right knee: Secondary | ICD-10-CM | POA: Diagnosis not present

## 2017-12-23 DIAGNOSIS — S80211A Abrasion, right knee, initial encounter: Secondary | ICD-10-CM | POA: Diagnosis not present

## 2018-01-09 DIAGNOSIS — I10 Essential (primary) hypertension: Secondary | ICD-10-CM | POA: Diagnosis not present

## 2018-01-09 DIAGNOSIS — M4850XA Collapsed vertebra, not elsewhere classified, site unspecified, initial encounter for fracture: Secondary | ICD-10-CM | POA: Diagnosis not present

## 2018-01-09 DIAGNOSIS — M5489 Other dorsalgia: Secondary | ICD-10-CM | POA: Diagnosis not present

## 2018-01-09 DIAGNOSIS — M4692 Unspecified inflammatory spondylopathy, cervical region: Secondary | ICD-10-CM | POA: Diagnosis not present

## 2018-01-09 DIAGNOSIS — M542 Cervicalgia: Secondary | ICD-10-CM | POA: Diagnosis not present

## 2018-01-09 DIAGNOSIS — Z6822 Body mass index (BMI) 22.0-22.9, adult: Secondary | ICD-10-CM | POA: Diagnosis not present

## 2018-01-11 ENCOUNTER — Ambulatory Visit: Payer: Medicare Other | Admitting: Gastroenterology

## 2018-01-16 DIAGNOSIS — M546 Pain in thoracic spine: Secondary | ICD-10-CM | POA: Diagnosis not present

## 2018-01-16 DIAGNOSIS — M5412 Radiculopathy, cervical region: Secondary | ICD-10-CM | POA: Diagnosis not present

## 2018-01-16 DIAGNOSIS — M4854XA Collapsed vertebra, not elsewhere classified, thoracic region, initial encounter for fracture: Secondary | ICD-10-CM | POA: Diagnosis not present

## 2018-01-25 DIAGNOSIS — M546 Pain in thoracic spine: Secondary | ICD-10-CM | POA: Diagnosis not present

## 2018-01-26 DIAGNOSIS — M542 Cervicalgia: Secondary | ICD-10-CM | POA: Diagnosis not present

## 2018-02-02 DIAGNOSIS — M4802 Spinal stenosis, cervical region: Secondary | ICD-10-CM | POA: Diagnosis not present

## 2018-02-03 DIAGNOSIS — H18221 Idiopathic corneal edema, right eye: Secondary | ICD-10-CM | POA: Diagnosis not present

## 2018-02-03 DIAGNOSIS — Z961 Presence of intraocular lens: Secondary | ICD-10-CM | POA: Diagnosis not present

## 2018-02-03 DIAGNOSIS — H353122 Nonexudative age-related macular degeneration, left eye, intermediate dry stage: Secondary | ICD-10-CM | POA: Diagnosis not present

## 2018-02-03 DIAGNOSIS — H5711 Ocular pain, right eye: Secondary | ICD-10-CM | POA: Diagnosis not present

## 2018-02-03 DIAGNOSIS — H401134 Primary open-angle glaucoma, bilateral, indeterminate stage: Secondary | ICD-10-CM | POA: Diagnosis not present

## 2018-02-06 DIAGNOSIS — I10 Essential (primary) hypertension: Secondary | ICD-10-CM | POA: Diagnosis not present

## 2018-02-06 DIAGNOSIS — E1169 Type 2 diabetes mellitus with other specified complication: Secondary | ICD-10-CM | POA: Diagnosis not present

## 2018-02-06 DIAGNOSIS — M542 Cervicalgia: Secondary | ICD-10-CM | POA: Diagnosis not present

## 2018-02-06 DIAGNOSIS — Z6823 Body mass index (BMI) 23.0-23.9, adult: Secondary | ICD-10-CM | POA: Diagnosis not present

## 2018-02-06 DIAGNOSIS — I519 Heart disease, unspecified: Secondary | ICD-10-CM | POA: Diagnosis not present

## 2018-02-06 DIAGNOSIS — M810 Age-related osteoporosis without current pathological fracture: Secondary | ICD-10-CM | POA: Diagnosis not present

## 2018-02-09 DIAGNOSIS — M542 Cervicalgia: Secondary | ICD-10-CM | POA: Diagnosis not present

## 2018-02-10 DIAGNOSIS — M542 Cervicalgia: Secondary | ICD-10-CM | POA: Diagnosis not present

## 2018-02-10 DIAGNOSIS — Z79891 Long term (current) use of opiate analgesic: Secondary | ICD-10-CM | POA: Diagnosis not present

## 2018-02-10 DIAGNOSIS — M79642 Pain in left hand: Secondary | ICD-10-CM | POA: Diagnosis not present

## 2018-02-17 ENCOUNTER — Other Ambulatory Visit (HOSPITAL_COMMUNITY): Payer: Self-pay

## 2018-02-20 ENCOUNTER — Ambulatory Visit (HOSPITAL_COMMUNITY)
Admission: RE | Admit: 2018-02-20 | Discharge: 2018-02-20 | Disposition: A | Discharge status: Home / Self Care | Payer: Medicare Other | Source: Ambulatory Visit | Attending: Internal Medicine | Admitting: Internal Medicine

## 2018-02-20 DIAGNOSIS — M810 Age-related osteoporosis without current pathological fracture: Secondary | ICD-10-CM | POA: Insufficient documentation

## 2018-02-20 MED ORDER — DENOSUMAB 60 MG/ML ~~LOC~~ SOSY
60.0000 mg | PREFILLED_SYRINGE | Freq: Once | SUBCUTANEOUS | Status: AC
  Administered 2018-02-20: 60 mg via SUBCUTANEOUS

## 2018-02-20 MED ORDER — DENOSUMAB 60 MG/ML ~~LOC~~ SOSY
PREFILLED_SYRINGE | SUBCUTANEOUS | Status: AC
  Administered 2018-02-20: 60 mg via SUBCUTANEOUS
  Filled 2018-02-20: qty 1

## 2018-02-20 NOTE — Discharge Instructions (Signed)
Denosumab injection °What is this medicine? °DENOSUMAB (den oh sue mab) slows bone breakdown. Prolia is used to treat osteoporosis in women after menopause and in men. Xgeva is used to treat a high calcium level due to cancer and to prevent bone fractures and other bone problems caused by multiple myeloma or cancer bone metastases. Xgeva is also used to treat giant cell tumor of the bone. °This medicine may be used for other purposes; ask your health care provider or pharmacist if you have questions. °COMMON BRAND NAME(S): Prolia, XGEVA °What should I tell my health care provider before I take this medicine? °They need to know if you have any of these conditions: °-dental disease °-having surgery or tooth extraction °-infection °-kidney disease °-low levels of calcium or Vitamin D in the blood °-malnutrition °-on hemodialysis °-skin conditions or sensitivity °-thyroid or parathyroid disease °-an unusual reaction to denosumab, other medicines, foods, dyes, or preservatives °-pregnant or trying to get pregnant °-breast-feeding °How should I use this medicine? °This medicine is for injection under the skin. It is given by a health care professional in a hospital or clinic setting. °If you are getting Prolia, a special MedGuide will be given to you by the pharmacist with each prescription and refill. Be sure to read this information carefully each time. °For Prolia, talk to your pediatrician regarding the use of this medicine in children. Special care may be needed. For Xgeva, talk to your pediatrician regarding the use of this medicine in children. While this drug may be prescribed for children as young as 13 years for selected conditions, precautions do apply. °Overdosage: If you think you have taken too much of this medicine contact a poison control center or emergency room at once. °NOTE: This medicine is only for you. Do not share this medicine with others. °What if I miss a dose? °It is important not to miss your  dose. Call your doctor or health care professional if you are unable to keep an appointment. °What may interact with this medicine? °Do not take this medicine with any of the following medications: °-other medicines containing denosumab °This medicine may also interact with the following medications: °-medicines that lower your chance of fighting infection °-steroid medicines like prednisone or cortisone °This list may not describe all possible interactions. Give your health care provider a list of all the medicines, herbs, non-prescription drugs, or dietary supplements you use. Also tell them if you smoke, drink alcohol, or use illegal drugs. Some items may interact with your medicine. °What should I watch for while using this medicine? °Visit your doctor or health care professional for regular checks on your progress. Your doctor or health care professional may order blood tests and other tests to see how you are doing. °Call your doctor or health care professional for advice if you get a fever, chills or sore throat, or other symptoms of a cold or flu. Do not treat yourself. This drug may decrease your body's ability to fight infection. Try to avoid being around people who are sick. °You should make sure you get enough calcium and vitamin D while you are taking this medicine, unless your doctor tells you not to. Discuss the foods you eat and the vitamins you take with your health care professional. °See your dentist regularly. Brush and floss your teeth as directed. Before you have any dental work done, tell your dentist you are receiving this medicine. °Do not become pregnant while taking this medicine or for 5 months after stopping   it. Talk with your doctor or health care professional about your birth control options while taking this medicine. Women should inform their doctor if they wish to become pregnant or think they might be pregnant. There is a potential for serious side effects to an unborn child. Talk  to your health care professional or pharmacist for more information. What side effects may I notice from receiving this medicine? Side effects that you should report to your doctor or health care professional as soon as possible: -allergic reactions like skin rash, itching or hives, swelling of the face, lips, or tongue -bone pain -breathing problems -dizziness -jaw pain, especially after dental work -redness, blistering, peeling of the skin -signs and symptoms of infection like fever or chills; cough; sore throat; pain or trouble passing urine -signs of low calcium like fast heartbeat, muscle cramps or muscle pain; pain, tingling, numbness in the hands or feet; seizures -unusual bleeding or bruising -unusually weak or tired Side effects that usually do not require medical attention (report to your doctor or health care professional if they continue or are bothersome): -constipation -diarrhea -headache -joint pain -loss of appetite -muscle pain -runny nose -tiredness -upset stomach This list may not describe all possible side effects. Call your doctor for medical advice about side effects. You may report side effects to FDA at 1-800-FDA-1088. Where should I keep my medicine? This medicine is only given in a clinic, doctor's office, or other health care setting and will not be stored at home. NOTE: This sheet is a summary. It may not cover all possible information. If you have questions about this medicine, talk to your doctor, pharmacist, or health care provider.  2018 [employer] Standard (2016-07-27 19:17:21)

## 2018-03-06 DIAGNOSIS — M810 Age-related osteoporosis without current pathological fracture: Secondary | ICD-10-CM | POA: Diagnosis not present

## 2018-03-21 ENCOUNTER — Ambulatory Visit: Payer: Medicare Other | Admitting: Gastroenterology

## 2018-03-31 DIAGNOSIS — M62838 Other muscle spasm: Secondary | ICD-10-CM | POA: Diagnosis not present

## 2018-03-31 DIAGNOSIS — M542 Cervicalgia: Secondary | ICD-10-CM | POA: Diagnosis not present

## 2018-04-13 DIAGNOSIS — M5136 Other intervertebral disc degeneration, lumbar region: Secondary | ICD-10-CM | POA: Diagnosis not present

## 2018-04-18 DIAGNOSIS — M961 Postlaminectomy syndrome, not elsewhere classified: Secondary | ICD-10-CM | POA: Diagnosis not present

## 2018-04-18 DIAGNOSIS — M5136 Other intervertebral disc degeneration, lumbar region: Secondary | ICD-10-CM | POA: Diagnosis not present

## 2018-04-18 DIAGNOSIS — M5030 Other cervical disc degeneration, unspecified cervical region: Secondary | ICD-10-CM | POA: Diagnosis not present

## 2018-04-18 DIAGNOSIS — Z79891 Long term (current) use of opiate analgesic: Secondary | ICD-10-CM | POA: Diagnosis not present

## 2018-04-26 DIAGNOSIS — E1169 Type 2 diabetes mellitus with other specified complication: Secondary | ICD-10-CM | POA: Diagnosis not present

## 2018-04-26 DIAGNOSIS — E7849 Other hyperlipidemia: Secondary | ICD-10-CM | POA: Diagnosis not present

## 2018-04-26 DIAGNOSIS — R82998 Other abnormal findings in urine: Secondary | ICD-10-CM | POA: Diagnosis not present

## 2018-04-26 DIAGNOSIS — E559 Vitamin D deficiency, unspecified: Secondary | ICD-10-CM | POA: Diagnosis not present

## 2018-04-26 DIAGNOSIS — Z23 Encounter for immunization: Secondary | ICD-10-CM | POA: Diagnosis not present

## 2018-04-26 DIAGNOSIS — I10 Essential (primary) hypertension: Secondary | ICD-10-CM | POA: Diagnosis not present

## 2018-05-03 DIAGNOSIS — N182 Chronic kidney disease, stage 2 (mild): Secondary | ICD-10-CM | POA: Diagnosis not present

## 2018-05-03 DIAGNOSIS — M542 Cervicalgia: Secondary | ICD-10-CM | POA: Diagnosis not present

## 2018-05-03 DIAGNOSIS — M810 Age-related osteoporosis without current pathological fracture: Secondary | ICD-10-CM | POA: Diagnosis not present

## 2018-05-03 DIAGNOSIS — Z1389 Encounter for screening for other disorder: Secondary | ICD-10-CM | POA: Diagnosis not present

## 2018-05-03 DIAGNOSIS — M4850XA Collapsed vertebra, not elsewhere classified, site unspecified, initial encounter for fracture: Secondary | ICD-10-CM | POA: Diagnosis not present

## 2018-05-03 DIAGNOSIS — Z0000 Encounter for general adult medical examination without abnormal findings: Secondary | ICD-10-CM | POA: Diagnosis not present

## 2018-05-03 DIAGNOSIS — F4320 Adjustment disorder, unspecified: Secondary | ICD-10-CM | POA: Diagnosis not present

## 2018-05-03 DIAGNOSIS — D72829 Elevated white blood cell count, unspecified: Secondary | ICD-10-CM | POA: Diagnosis not present

## 2018-05-03 DIAGNOSIS — I519 Heart disease, unspecified: Secondary | ICD-10-CM | POA: Diagnosis not present

## 2018-05-03 DIAGNOSIS — Z6823 Body mass index (BMI) 23.0-23.9, adult: Secondary | ICD-10-CM | POA: Diagnosis not present

## 2018-05-03 DIAGNOSIS — R634 Abnormal weight loss: Secondary | ICD-10-CM | POA: Diagnosis not present

## 2018-05-03 DIAGNOSIS — E1169 Type 2 diabetes mellitus with other specified complication: Secondary | ICD-10-CM | POA: Diagnosis not present

## 2018-05-04 DIAGNOSIS — M961 Postlaminectomy syndrome, not elsewhere classified: Secondary | ICD-10-CM | POA: Diagnosis not present

## 2018-05-04 DIAGNOSIS — M5136 Other intervertebral disc degeneration, lumbar region: Secondary | ICD-10-CM | POA: Diagnosis not present

## 2018-05-04 DIAGNOSIS — Z1212 Encounter for screening for malignant neoplasm of rectum: Secondary | ICD-10-CM | POA: Diagnosis not present

## 2018-05-12 DIAGNOSIS — R221 Localized swelling, mass and lump, neck: Secondary | ICD-10-CM | POA: Diagnosis not present

## 2018-05-12 DIAGNOSIS — M50322 Other cervical disc degeneration at C5-C6 level: Secondary | ICD-10-CM | POA: Diagnosis not present

## 2018-05-24 DIAGNOSIS — L821 Other seborrheic keratosis: Secondary | ICD-10-CM | POA: Diagnosis not present

## 2018-05-24 DIAGNOSIS — D0461 Carcinoma in situ of skin of right upper limb, including shoulder: Secondary | ICD-10-CM | POA: Diagnosis not present

## 2018-06-06 DIAGNOSIS — H18221 Idiopathic corneal edema, right eye: Secondary | ICD-10-CM | POA: Diagnosis not present

## 2018-06-06 DIAGNOSIS — H401134 Primary open-angle glaucoma, bilateral, indeterminate stage: Secondary | ICD-10-CM | POA: Diagnosis not present

## 2018-06-06 DIAGNOSIS — H5711 Ocular pain, right eye: Secondary | ICD-10-CM | POA: Diagnosis not present

## 2018-06-06 DIAGNOSIS — H353122 Nonexudative age-related macular degeneration, left eye, intermediate dry stage: Secondary | ICD-10-CM | POA: Diagnosis not present

## 2018-06-06 DIAGNOSIS — Z961 Presence of intraocular lens: Secondary | ICD-10-CM | POA: Diagnosis not present

## 2018-06-09 ENCOUNTER — Encounter (INDEPENDENT_AMBULATORY_CARE_PROVIDER_SITE_OTHER): Payer: Medicare Other | Admitting: Ophthalmology

## 2018-06-09 DIAGNOSIS — E11319 Type 2 diabetes mellitus with unspecified diabetic retinopathy without macular edema: Secondary | ICD-10-CM

## 2018-06-09 DIAGNOSIS — E113291 Type 2 diabetes mellitus with mild nonproliferative diabetic retinopathy without macular edema, right eye: Secondary | ICD-10-CM | POA: Diagnosis not present

## 2018-06-09 DIAGNOSIS — H59031 Cystoid macular edema following cataract surgery, right eye: Secondary | ICD-10-CM

## 2018-06-09 DIAGNOSIS — H35033 Hypertensive retinopathy, bilateral: Secondary | ICD-10-CM | POA: Diagnosis not present

## 2018-06-09 DIAGNOSIS — I10 Essential (primary) hypertension: Secondary | ICD-10-CM | POA: Diagnosis not present

## 2018-06-09 DIAGNOSIS — H43811 Vitreous degeneration, right eye: Secondary | ICD-10-CM | POA: Diagnosis not present

## 2018-06-12 DIAGNOSIS — M545 Low back pain: Secondary | ICD-10-CM | POA: Diagnosis not present

## 2018-06-12 DIAGNOSIS — M5136 Other intervertebral disc degeneration, lumbar region: Secondary | ICD-10-CM | POA: Diagnosis not present

## 2018-06-12 DIAGNOSIS — Z79891 Long term (current) use of opiate analgesic: Secondary | ICD-10-CM | POA: Diagnosis not present

## 2018-06-20 ENCOUNTER — Emergency Department (HOSPITAL_COMMUNITY)
Admission: EM | Admit: 2018-06-20 | Discharge: 2018-06-20 | Disposition: A | Discharge status: Home / Self Care | Payer: Medicare Other | Attending: Emergency Medicine | Admitting: Emergency Medicine

## 2018-06-20 ENCOUNTER — Encounter (HOSPITAL_COMMUNITY): Payer: Self-pay

## 2018-06-20 ENCOUNTER — Other Ambulatory Visit: Payer: Self-pay

## 2018-06-20 ENCOUNTER — Emergency Department (HOSPITAL_COMMUNITY): Payer: Medicare Other

## 2018-06-20 DIAGNOSIS — R109 Unspecified abdominal pain: Secondary | ICD-10-CM | POA: Diagnosis not present

## 2018-06-20 DIAGNOSIS — E119 Type 2 diabetes mellitus without complications: Secondary | ICD-10-CM | POA: Insufficient documentation

## 2018-06-20 DIAGNOSIS — Z7984 Long term (current) use of oral hypoglycemic drugs: Secondary | ICD-10-CM | POA: Insufficient documentation

## 2018-06-20 DIAGNOSIS — Z7982 Long term (current) use of aspirin: Secondary | ICD-10-CM | POA: Insufficient documentation

## 2018-06-20 DIAGNOSIS — Z85828 Personal history of other malignant neoplasm of skin: Secondary | ICD-10-CM | POA: Insufficient documentation

## 2018-06-20 DIAGNOSIS — K5900 Constipation, unspecified: Secondary | ICD-10-CM | POA: Diagnosis not present

## 2018-06-20 DIAGNOSIS — I10 Essential (primary) hypertension: Secondary | ICD-10-CM | POA: Insufficient documentation

## 2018-06-20 DIAGNOSIS — Z79899 Other long term (current) drug therapy: Secondary | ICD-10-CM | POA: Insufficient documentation

## 2018-06-20 DIAGNOSIS — K5730 Diverticulosis of large intestine without perforation or abscess without bleeding: Secondary | ICD-10-CM | POA: Diagnosis not present

## 2018-06-20 LAB — CBC
HCT: 42.6 % (ref 36.0–46.0)
Hemoglobin: 13.3 g/dL (ref 12.0–15.0)
MCH: 31.1 pg (ref 26.0–34.0)
MCHC: 31.2 g/dL (ref 30.0–36.0)
MCV: 99.5 fL (ref 80.0–100.0)
PLATELETS: 388 10*3/uL (ref 150–400)
RBC: 4.28 MIL/uL (ref 3.87–5.11)
RDW: 12.7 % (ref 11.5–15.5)
WBC: 11.1 10*3/uL — ABNORMAL HIGH (ref 4.0–10.5)
nRBC: 0 % (ref 0.0–0.2)

## 2018-06-20 LAB — COMPREHENSIVE METABOLIC PANEL
ALK PHOS: 65 U/L (ref 38–126)
ALT: 22 U/L (ref 0–44)
AST: 28 U/L (ref 15–41)
Albumin: 4.2 g/dL (ref 3.5–5.0)
Anion gap: 10 (ref 5–15)
BUN: 20 mg/dL (ref 8–23)
CALCIUM: 9.3 mg/dL (ref 8.9–10.3)
CO2: 22 mmol/L (ref 22–32)
CREATININE: 0.83 mg/dL (ref 0.44–1.00)
Chloride: 105 mmol/L (ref 98–111)
GFR calc Af Amer: >60 mL/min (ref >60)
GFR calc non Af Amer: >60 mL/min (ref >60)
Glucose, Bld: 141 mg/dL — ABNORMAL HIGH (ref 70–99)
Potassium: 4.2 mmol/L (ref 3.5–5.1)
SODIUM: 137 mmol/L (ref 135–145)
Total Bilirubin: 0.3 mg/dL (ref 0.3–1.2)
Total Protein: 7.4 g/dL (ref 6.5–8.1)

## 2018-06-20 LAB — LIPASE, BLOOD: Lipase: 43 U/L (ref 11–51)

## 2018-06-20 LAB — POC OCCULT BLOOD, ED: Fecal Occult Bld: NEGATIVE

## 2018-06-20 MED ORDER — FENTANYL CITRATE (PF) 100 MCG/2ML IJ SOLN
25.0000 ug | Freq: Once | INTRAMUSCULAR | Status: AC
  Administered 2018-06-20: 25 ug via INTRAVENOUS
  Filled 2018-06-20: qty 2

## 2018-06-20 MED ORDER — SODIUM CHLORIDE 0.9 % IV BOLUS
500.0000 mL | Freq: Once | INTRAVENOUS | Status: AC
Start: 2018-06-20 — End: 2018-06-20
  Administered 2018-06-20: 500 mL via INTRAVENOUS

## 2018-06-20 MED ORDER — FENTANYL CITRATE (PF) 100 MCG/2ML IJ SOLN
50.0000 ug | Freq: Once | INTRAMUSCULAR | Status: AC
  Administered 2018-06-20: 50 ug via INTRAVENOUS
  Filled 2018-06-20: qty 2

## 2018-06-20 MED ORDER — PEG 3350-KCL-NABCB-NACL-NASULF 236 G PO SOLR: 4000.0000 mL | Freq: Once | ORAL | 0 refills | Status: AC

## 2018-06-20 MED ORDER — IOPAMIDOL (ISOVUE-300) INJECTION 61%
100.0000 mL | Freq: Once | INTRAVENOUS | Status: AC | PRN
  Administered 2018-06-20: 100 mL via INTRAVENOUS

## 2018-06-20 MED ORDER — FLEET ENEMA 7-19 GM/118ML RE ENEM
1.0000 | ENEMA | Freq: Once | RECTAL | Status: DC
  Filled 2018-06-20: qty 1

## 2018-06-20 NOTE — ED Triage Notes (Signed)
Patient reports that she took daily laxatives 3 days prior to Thanksgiving and repeated laxatives 2 days after thanksgiving with no results. Patient states she has had some liquid stool 2 days ago and today and it was black in color.

## 2018-06-20 NOTE — ED Notes (Signed)
Sacral mepilex placed on pt

## 2018-06-20 NOTE — ED Notes (Signed)
Pt aware urine specimen is needed. Will ring call bell when she needs to void

## 2018-06-20 NOTE — Discharge Instructions (Signed)
Take GoLytely to have a BM Please follow up with your doctor Return if worsening

## 2018-06-20 NOTE — ED Notes (Signed)
Pt has returned from CT scan.

## 2018-06-20 NOTE — ED Provider Notes (Signed)
[facility] DEPT Provider Note   CSN: [id number] Arrival date & time: 06/20/18  1626     History   Chief Complaint Chief Complaint  Patient presents with  . Constipation  . possible GI bleeding.    HPI Marissa Ryan is a 84 y.o. female who presents with abdominal pain and black stools. PMH significant for chronic neck and back pain on Oxycodone and Gabapentin, DM, HTN, HLD, IBS, diverticulosis. The patient states that she's been constipated since last week. She started taking laxatives but didn't have a BM. On Thanksgiving she ate a lot and Friday didn't feel so well after. She's been just drinking ensures and soups. She took a stool softener but still didn't have a BM. She took some Pepto-bismol yesterday and then last night and today she had a small BM that was a "tablespoon" size and black and slimy. This concerned her so she decided to come to the ED. She had one episode of vomiting last week but this has resolved. She is passing gas. She reports some LLQ abdominal pain which has improved some. No fever, N/V, urinary symptoms. Past surgical hx significant for appendectomy, cholecystectomy  HPI  Past Medical History:  Diagnosis Date  . Anxiety   . Arthritis   . Cancer ([place])    hx of skin cancer removal on hand   . Colitis   . Diabetes mellitus without complication ([place])   . Gallstones   . Hyperlipidemia   . Hypertension   . IBS (irritable bowel syndrome)   . Osteoporosis     Patient Active Problem List   Diagnosis Date Noted  . Pressure injury of skin 10/15/2017  . Abdominal pain 10/14/2017  . IBS (irritable bowel syndrome) 10/14/2017  . Anxiety 10/14/2017  . Anemia 10/14/2017  . 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 OA (osteoarthritis) of hip 02/20/2014  . Essential hypertension 12/03/2013  . Hyperlipidemia 12/03/2013  . Diabetes ([place]) 12/03/2013    Past Surgical History:  Procedure Laterality Date  . APPENDECTOMY     . BACK SURGERY     x2  . cataract surgery      bilateral   . CHOLECYSTECTOMY  1999  . EYE SURGERY     for glaucoma   . left eye peeling surgery     . left femur bone surgery      1978  . REVERSE SHOULDER ARTHROPLASTY Right 10/10/2014   Procedure: RIGHT REVERSE SHOULDER ARTHROPLASTY;  Surgeon: Justice Britain, MD;  Location: [facility];  Service: Orthopedics;  Laterality: Right;  . ROTATOR CUFF REPAIR    . TOTAL HIP ARTHROPLASTY Left 02/20/2014   Procedure: LEFT TOTAL HIP ARTHROPLASTY ANTERIOR APPROACH;  Surgeon: Gearlean Alf, MD;  Location: [facility];  Service: Orthopedics;  Laterality: Left;  . WRIST SURGERY     left      OB History   None      Home Medications    Prior to Admission medications   Medication Sig Start Date End Date Taking? Authorizing Provider  ALPRAZolam (XANAX) 0.25 MG tablet Take 1 tablet (0.25 mg total) by mouth at bedtime. 10/20/17   Elwyn Reach, MD  amLODipine (NORVASC) 5 MG tablet Take 5 mg by mouth daily.  12/30/16   [provider]  aspirin EC 81 MG tablet Take 81 mg by mouth daily.    [provider]  Cinnamon 500 MG capsule Take 500 mg by mouth daily.    [provider]  docusate sodium 100 MG CAPS Take 100 mg by mouth 2 (two) times daily. 02/25/14   Perkins, Alexzandrew L, PA-C  furosemide (LASIX) 20 MG tablet Take 20 mg by mouth daily.     [provider]  gabapentin (NEURONTIN) 300 MG capsule Take 300 mg by mouth 3 (three) times daily.     [provider]  guaiFENesin (MUCINEX) 600 MG 12 hr tablet Take 1 tablet (600 mg total) by mouth 2 (two) times daily. Patient not taking: Reported on 11/26/2017 10/20/17   Elwyn Reach, MD  ipratropium (ATROVENT) 0.03 % nasal spray Place 2 sprays into both nostrils 3 (three) times daily as needed for rhinitis.    [provider]  losartan (COZAAR) 100 MG tablet Take 100 mg by mouth daily with supper.    [provider]  meloxicam (MOBIC) 15 MG tablet  Take 15 mg by mouth daily.    [provider]  metFORMIN (GLUCOPHAGE) 500 MG tablet Take 500 mg by mouth daily with breakfast.    [provider]  Multiple Vitamins-Calcium (ONE-A-DAY WOMENS PO) Take 1 tablet by mouth daily.    [provider]  oxyCODONE-acetaminophen (PERCOCET) 10-325 MG tablet Take 1 tablet by mouth every 6 (six) hours as needed for pain. Patient taking differently: Take 0.5 tablets by mouth 3 (three) times daily as needed for pain.  10/20/17   Elwyn Reach, MD  Polyethyl Glyc-Propyl Glyc PF (SYSTANE ULTRA PF) 0.4-0.3 % SOLN Place 1-2 drops into both eyes every 6 (six) hours as needed (for irritation).    [provider]  prednisoLONE acetate (PRED FORTE) 1 % ophthalmic suspension Place 1 drop into the right eye 2 (two) times daily.    [provider]  sertraline (ZOLOFT) 50 MG tablet Take 50 mg by mouth daily. 08/27/17   [provider]  simvastatin (ZOCOR) 20 MG tablet Take 20 mg by mouth daily. 01/31/17   [provider]  sodium chloride (MURO 128) 5 % ophthalmic ointment Place 1 application into the right eye at bedtime.    [provider]    Family History Family History  Problem Relation Age of Onset  . Stroke Mother   . Hypertension Mother   . Ovarian cancer Sister     Social History Social History   Tobacco Use  . Smoking status: Never Smoker  . Smokeless tobacco: Never Used  Substance Use Topics  . Alcohol use: Yes    Comment: rare glass of wine   . Drug use: No     Allergies   Robaxin [methocarbamol]; Ancef [cefazolin]; Morphine and related; Penicillins; Tape; and Flurbiprofen   Review of Systems Review of Systems  Constitutional: Negative for chills and fever.  Respiratory: Negative for shortness of breath.   Cardiovascular: Negative for chest pain.  Gastrointestinal: Positive for abdominal pain, blood in stool and constipation. Negative for diarrhea, nausea and vomiting.    Genitourinary: Negative for difficulty urinating, dysuria and flank pain.  All other systems reviewed and are negative.    Physical Exam Updated Vital Signs BP (!) 155/70 (BP Location: Left Arm)   Pulse 91   Temp (!) 97.5 F (36.4 C) (Oral)   Resp 16   Ht 5\' 2"  (1.575 m)   Wt 54.4 kg   SpO2 98%   BMI 21.95 kg/m   Physical Exam  Constitutional: She is oriented to person, place, and time. She appears well-developed and well-nourished. No distress.  Calm and cooperative. Well appearing  HENT:  Head: Normocephalic and atraumatic.  Eyes: Pupils are equal, round, and reactive to light. Conjunctivae are normal. Right eye exhibits no discharge. Left eye exhibits no discharge. No scleral icterus.  Neck: Normal range of motion.  Cardiovascular: Normal rate and regular rhythm.  Pulmonary/Chest: Effort normal and breath sounds normal. No respiratory distress.  Abdominal: Soft. Bowel sounds are normal. She exhibits no distension and no mass. There is tenderness (mild right sided abdominal tenderness). There is no rebound and no guarding. No hernia.  Well healed right sided surgical scar  Genitourinary:  Genitourinary Comments: Rectal: Skin breakdown with ulcer over the left buttocks. No fecal impaction palpated. Dark stool with rectal exam. No tenderness  Neurological: She is alert and oriented to person, place, and time.  Skin: Skin is warm and dry.  Psychiatric: She has a normal mood and affect. Her behavior is normal.  Nursing note and vitals reviewed.    ED Treatments / Results  Labs (all labs ordered are listed, but only abnormal results are displayed) Labs Reviewed  COMPREHENSIVE METABOLIC PANEL - Abnormal; Notable for the following components:      Result Value   Glucose, Bld 141 (*)    All other components within normal limits  CBC - Abnormal; Notable for the following components:   WBC 11.1 (*)    All other components within normal limits  LIPASE, BLOOD  URINALYSIS,  ROUTINE W REFLEX MICROSCOPIC  POC OCCULT BLOOD, ED    EKG None  Radiology Ct Abdomen Pelvis W Contrast  Result Date: 06/20/2018 CLINICAL DATA:  Patient took laxative 3 days prior to Thanksgiving and repeated laxatives 2 days after Thanksgiving without results. Liquid stool 2 days ago and now today has black stools. EXAM: CT ABDOMEN AND PELVIS WITH CONTRAST TECHNIQUE: Multidetector CT imaging of the abdomen and pelvis was performed using the standard protocol following bolus administration of intravenous contrast. CONTRAST:  118mL ISOVUE-300 IOPAMIDOL (ISOVUE-300) INJECTION 61% COMPARISON:  10/14/2017 FINDINGS: Lower chest: Top normal heart size without pericardial effusion. Subpleural atelectasis and/or scarring at the lung bases. Small fat containing [place] diaphragmatic hernia on the right. Hepatobiliary: Cholecystectomy. No hepatic mass or biliary dilatation. Small granuloma in the left hepatic lobe. Pancreas: Unremarkable. No pancreatic ductal dilatation or surrounding inflammatory changes. Spleen: Normal in size without focal abnormality. Adrenals/Urinary Tract: Adrenal glands are unremarkable. Kidneys are normal, without renal calculi, focal lesion, or hydronephrosis. Bladder is unremarkable. Stomach/Bowel: Small hiatal hernia. Decompressed stomach. Normal small bowel rotation without obstruction or inflammation. Moderate stool retention within the colon. Diffuse mild transmural thickening along the descending colon similar to prior may be due to underdistention or given presence of stool, stercoral colitis. Colonic diverticulosis without acute diverticulitis is noted of the distal descending sigmoid colon. Status post appendectomy. Vascular/Lymphatic: Mild aortoiliac and branch vessel atherosclerosis. No adenopathy. Reproductive: Uterus and bilateral adnexa are unremarkable for age though limited in assessment due to metallic streak artifacts from the patient's pre-existing left hip arthroplasty.  Other: No abdominal wall hernia or abnormality. No abdominopelvic ascites. Musculoskeletal: Moderate compression deformity of T11 and L1 with thoracolumbar spondylosis characterized by multilevel degenerative disc disease, moderate-to-marked in appearance with facet arthrosis. IMPRESSION: 1. Moderate stool retention within the colon with redemonstration of mild diffuse transmural thickening along the descending suspicious for either stercoral colitis and/or underdistention. No bowel obstruction perforation. 2. Sigmoid diverticulosis without acute diverticulitis. 3. Moderate compression deformity of T11 and L1 thoracolumbar spondylosis. Electronically Signed   By: Ashley Royalty M.D.   On: 06/20/2018 20:29  Procedures Procedures (including critical care time)  Medications Ordered in ED Medications  sodium chloride 0.9 % bolus 500 mL (0 mLs Intravenous Stopped 06/20/18 2010)  fentaNYL (SUBLIMAZE) injection 50 mcg (50 mcg Intravenous Given 06/20/18 1816)  iopamidol (ISOVUE-300) 61 % injection 100 mL (100 mLs Intravenous Contrast Given 06/20/18 1953)  fentaNYL (SUBLIMAZE) injection 25 mcg (25 mcg Intravenous Given 06/20/18 2100)     Initial Impression / Assessment and Plan / ED Course  I have reviewed the triage vital signs and the nursing notes.  Pertinent labs & imaging results that were available during my care of the patient were reviewed by me and considered in my medical decision making (see chart for details).  84 year old female presents with abdominal pain and constipation with black stool. She is hypertensive but otherwise vitals are normal. On exam she has mild lower abdominal tenderness. She does not have a fecal impaction on rectal exam. CBC is remarkable for mild leukocytosis of 11.1. CMP and lipase are overall normal. Fecal occult is negative. CT shows: moderate stool retention within the colon with redemonstration of mild diffuse transmural thickening along the descending suspicious for  either stercoral colitis and/or underdistention. No bowel obstruction or perforation. Shared visit with Dr. Darl Householder. Pt was offered enema and declined. She will be given golytely for home and is agreeable.   Final Clinical Impressions(s) / ED Diagnoses   Final diagnoses:  Constipation, unspecified constipation type    ED Discharge Orders    None       Recardo Evangelist, PA-C 06/20/18 2145    Drenda Freeze, MD 06/20/18 [id number]

## 2018-06-23 DIAGNOSIS — L89153 Pressure ulcer of sacral region, stage 3: Secondary | ICD-10-CM | POA: Diagnosis not present

## 2018-06-23 DIAGNOSIS — Z6823 Body mass index (BMI) 23.0-23.9, adult: Secondary | ICD-10-CM | POA: Diagnosis not present

## 2018-06-27 DIAGNOSIS — M5136 Other intervertebral disc degeneration, lumbar region: Secondary | ICD-10-CM | POA: Diagnosis not present

## 2018-06-27 DIAGNOSIS — M545 Low back pain: Secondary | ICD-10-CM | POA: Diagnosis not present

## 2018-06-29 DIAGNOSIS — Z7984 Long term (current) use of oral hypoglycemic drugs: Secondary | ICD-10-CM | POA: Diagnosis not present

## 2018-06-29 DIAGNOSIS — F329 Major depressive disorder, single episode, unspecified: Secondary | ICD-10-CM | POA: Diagnosis not present

## 2018-06-29 DIAGNOSIS — K5792 Diverticulitis of intestine, part unspecified, without perforation or abscess without bleeding: Secondary | ICD-10-CM | POA: Diagnosis not present

## 2018-06-29 DIAGNOSIS — E119 Type 2 diabetes mellitus without complications: Secondary | ICD-10-CM | POA: Diagnosis not present

## 2018-06-29 DIAGNOSIS — F3289 Other specified depressive episodes: Secondary | ICD-10-CM | POA: Diagnosis not present

## 2018-06-29 DIAGNOSIS — I10 Essential (primary) hypertension: Secondary | ICD-10-CM | POA: Diagnosis not present

## 2018-06-29 DIAGNOSIS — L89153 Pressure ulcer of sacral region, stage 3: Secondary | ICD-10-CM | POA: Diagnosis not present

## 2018-06-29 DIAGNOSIS — M8008XD Age-related osteoporosis with current pathological fracture, vertebra(e), subsequent encounter for fracture with routine healing: Secondary | ICD-10-CM | POA: Diagnosis not present

## 2018-06-29 DIAGNOSIS — M6281 Muscle weakness (generalized): Secondary | ICD-10-CM | POA: Diagnosis not present

## 2018-06-29 DIAGNOSIS — R2689 Other abnormalities of gait and mobility: Secondary | ICD-10-CM | POA: Diagnosis not present

## 2018-07-01 DIAGNOSIS — L89153 Pressure ulcer of sacral region, stage 3: Secondary | ICD-10-CM | POA: Diagnosis not present

## 2018-07-01 DIAGNOSIS — M8008XD Age-related osteoporosis with current pathological fracture, vertebra(e), subsequent encounter for fracture with routine healing: Secondary | ICD-10-CM | POA: Diagnosis not present

## 2018-07-01 DIAGNOSIS — I10 Essential (primary) hypertension: Secondary | ICD-10-CM | POA: Diagnosis not present

## 2018-07-01 DIAGNOSIS — K5792 Diverticulitis of intestine, part unspecified, without perforation or abscess without bleeding: Secondary | ICD-10-CM | POA: Diagnosis not present

## 2018-07-01 DIAGNOSIS — E119 Type 2 diabetes mellitus without complications: Secondary | ICD-10-CM | POA: Diagnosis not present

## 2018-07-01 DIAGNOSIS — F329 Major depressive disorder, single episode, unspecified: Secondary | ICD-10-CM | POA: Diagnosis not present

## 2018-07-05 DIAGNOSIS — M8008XD Age-related osteoporosis with current pathological fracture, vertebra(e), subsequent encounter for fracture with routine healing: Secondary | ICD-10-CM | POA: Diagnosis not present

## 2018-07-05 DIAGNOSIS — I10 Essential (primary) hypertension: Secondary | ICD-10-CM | POA: Diagnosis not present

## 2018-07-05 DIAGNOSIS — F329 Major depressive disorder, single episode, unspecified: Secondary | ICD-10-CM | POA: Diagnosis not present

## 2018-07-05 DIAGNOSIS — E119 Type 2 diabetes mellitus without complications: Secondary | ICD-10-CM | POA: Diagnosis not present

## 2018-07-05 DIAGNOSIS — L89153 Pressure ulcer of sacral region, stage 3: Secondary | ICD-10-CM | POA: Diagnosis not present

## 2018-07-05 DIAGNOSIS — K5792 Diverticulitis of intestine, part unspecified, without perforation or abscess without bleeding: Secondary | ICD-10-CM | POA: Diagnosis not present

## 2018-07-07 DIAGNOSIS — M8008XD Age-related osteoporosis with current pathological fracture, vertebra(e), subsequent encounter for fracture with routine healing: Secondary | ICD-10-CM | POA: Diagnosis not present

## 2018-07-07 DIAGNOSIS — I10 Essential (primary) hypertension: Secondary | ICD-10-CM | POA: Diagnosis not present

## 2018-07-07 DIAGNOSIS — M545 Low back pain: Secondary | ICD-10-CM | POA: Diagnosis not present

## 2018-07-07 DIAGNOSIS — K5792 Diverticulitis of intestine, part unspecified, without perforation or abscess without bleeding: Secondary | ICD-10-CM | POA: Diagnosis not present

## 2018-07-07 DIAGNOSIS — L89153 Pressure ulcer of sacral region, stage 3: Secondary | ICD-10-CM | POA: Diagnosis not present

## 2018-07-07 DIAGNOSIS — F329 Major depressive disorder, single episode, unspecified: Secondary | ICD-10-CM | POA: Diagnosis not present

## 2018-07-07 DIAGNOSIS — E119 Type 2 diabetes mellitus without complications: Secondary | ICD-10-CM | POA: Diagnosis not present

## 2018-07-10 DIAGNOSIS — E119 Type 2 diabetes mellitus without complications: Secondary | ICD-10-CM | POA: Diagnosis not present

## 2018-07-10 DIAGNOSIS — M8008XD Age-related osteoporosis with current pathological fracture, vertebra(e), subsequent encounter for fracture with routine healing: Secondary | ICD-10-CM | POA: Diagnosis not present

## 2018-07-10 DIAGNOSIS — L89153 Pressure ulcer of sacral region, stage 3: Secondary | ICD-10-CM | POA: Diagnosis not present

## 2018-07-10 DIAGNOSIS — K5792 Diverticulitis of intestine, part unspecified, without perforation or abscess without bleeding: Secondary | ICD-10-CM | POA: Diagnosis not present

## 2018-07-10 DIAGNOSIS — I10 Essential (primary) hypertension: Secondary | ICD-10-CM | POA: Diagnosis not present

## 2018-07-10 DIAGNOSIS — F329 Major depressive disorder, single episode, unspecified: Secondary | ICD-10-CM | POA: Diagnosis not present

## 2018-07-14 DIAGNOSIS — M545 Low back pain: Secondary | ICD-10-CM | POA: Diagnosis not present

## 2018-07-17 DIAGNOSIS — F329 Major depressive disorder, single episode, unspecified: Secondary | ICD-10-CM | POA: Diagnosis not present

## 2018-07-17 DIAGNOSIS — E119 Type 2 diabetes mellitus without complications: Secondary | ICD-10-CM | POA: Diagnosis not present

## 2018-07-17 DIAGNOSIS — K5792 Diverticulitis of intestine, part unspecified, without perforation or abscess without bleeding: Secondary | ICD-10-CM | POA: Diagnosis not present

## 2018-07-17 DIAGNOSIS — M8008XD Age-related osteoporosis with current pathological fracture, vertebra(e), subsequent encounter for fracture with routine healing: Secondary | ICD-10-CM | POA: Diagnosis not present

## 2018-07-17 DIAGNOSIS — I10 Essential (primary) hypertension: Secondary | ICD-10-CM | POA: Diagnosis not present

## 2018-07-17 DIAGNOSIS — L89153 Pressure ulcer of sacral region, stage 3: Secondary | ICD-10-CM | POA: Diagnosis not present

## 2018-07-18 DIAGNOSIS — E119 Type 2 diabetes mellitus without complications: Secondary | ICD-10-CM | POA: Diagnosis not present

## 2018-07-18 DIAGNOSIS — L89153 Pressure ulcer of sacral region, stage 3: Secondary | ICD-10-CM | POA: Diagnosis not present

## 2018-07-18 DIAGNOSIS — F329 Major depressive disorder, single episode, unspecified: Secondary | ICD-10-CM | POA: Diagnosis not present

## 2018-07-18 DIAGNOSIS — M8008XD Age-related osteoporosis with current pathological fracture, vertebra(e), subsequent encounter for fracture with routine healing: Secondary | ICD-10-CM | POA: Diagnosis not present

## 2018-07-18 DIAGNOSIS — K5792 Diverticulitis of intestine, part unspecified, without perforation or abscess without bleeding: Secondary | ICD-10-CM | POA: Diagnosis not present

## 2018-07-18 DIAGNOSIS — I10 Essential (primary) hypertension: Secondary | ICD-10-CM | POA: Diagnosis not present

## 2018-07-21 DIAGNOSIS — K5792 Diverticulitis of intestine, part unspecified, without perforation or abscess without bleeding: Secondary | ICD-10-CM | POA: Diagnosis not present

## 2018-07-21 DIAGNOSIS — E119 Type 2 diabetes mellitus without complications: Secondary | ICD-10-CM | POA: Diagnosis not present

## 2018-07-21 DIAGNOSIS — F329 Major depressive disorder, single episode, unspecified: Secondary | ICD-10-CM | POA: Diagnosis not present

## 2018-07-21 DIAGNOSIS — I10 Essential (primary) hypertension: Secondary | ICD-10-CM | POA: Diagnosis not present

## 2018-07-21 DIAGNOSIS — L89153 Pressure ulcer of sacral region, stage 3: Secondary | ICD-10-CM | POA: Diagnosis not present

## 2018-07-21 DIAGNOSIS — M8008XD Age-related osteoporosis with current pathological fracture, vertebra(e), subsequent encounter for fracture with routine healing: Secondary | ICD-10-CM | POA: Diagnosis not present

## 2018-07-24 ENCOUNTER — Other Ambulatory Visit: Payer: Self-pay | Admitting: Interventional Radiology

## 2018-07-24 ENCOUNTER — Other Ambulatory Visit (HOSPITAL_COMMUNITY): Payer: Self-pay | Admitting: Interventional Radiology

## 2018-07-24 ENCOUNTER — Ambulatory Visit
Admission: RE | Admit: 2018-07-24 | Discharge: 2018-07-24 | Disposition: A | Discharge status: Home / Self Care | Payer: Self-pay | Source: Ambulatory Visit | Attending: Interventional Radiology | Admitting: Interventional Radiology

## 2018-07-24 DIAGNOSIS — R52 Pain, unspecified: Secondary | ICD-10-CM

## 2018-07-25 ENCOUNTER — Telehealth (HOSPITAL_COMMUNITY): Payer: Self-pay

## 2018-07-25 DIAGNOSIS — M8008XD Age-related osteoporosis with current pathological fracture, vertebra(e), subsequent encounter for fracture with routine healing: Secondary | ICD-10-CM | POA: Diagnosis not present

## 2018-07-25 DIAGNOSIS — K5792 Diverticulitis of intestine, part unspecified, without perforation or abscess without bleeding: Secondary | ICD-10-CM | POA: Diagnosis not present

## 2018-07-25 DIAGNOSIS — L89153 Pressure ulcer of sacral region, stage 3: Secondary | ICD-10-CM | POA: Diagnosis not present

## 2018-07-25 DIAGNOSIS — E119 Type 2 diabetes mellitus without complications: Secondary | ICD-10-CM | POA: Diagnosis not present

## 2018-07-25 DIAGNOSIS — I10 Essential (primary) hypertension: Secondary | ICD-10-CM | POA: Diagnosis not present

## 2018-07-25 DIAGNOSIS — F329 Major depressive disorder, single episode, unspecified: Secondary | ICD-10-CM | POA: Diagnosis not present

## 2018-07-25 NOTE — Telephone Encounter (Signed)
Called to schedule consult, no answer, no vm. AW  

## 2018-07-26 DIAGNOSIS — S8001XA Contusion of right knee, initial encounter: Secondary | ICD-10-CM | POA: Diagnosis not present

## 2018-07-26 DIAGNOSIS — M1711 Unilateral primary osteoarthritis, right knee: Secondary | ICD-10-CM | POA: Diagnosis not present

## 2018-07-26 DIAGNOSIS — M25561 Pain in right knee: Secondary | ICD-10-CM | POA: Diagnosis not present

## 2018-07-27 DIAGNOSIS — M8008XD Age-related osteoporosis with current pathological fracture, vertebra(e), subsequent encounter for fracture with routine healing: Secondary | ICD-10-CM | POA: Diagnosis not present

## 2018-07-27 DIAGNOSIS — E119 Type 2 diabetes mellitus without complications: Secondary | ICD-10-CM | POA: Diagnosis not present

## 2018-07-27 DIAGNOSIS — K5792 Diverticulitis of intestine, part unspecified, without perforation or abscess without bleeding: Secondary | ICD-10-CM | POA: Diagnosis not present

## 2018-07-27 DIAGNOSIS — I10 Essential (primary) hypertension: Secondary | ICD-10-CM | POA: Diagnosis not present

## 2018-07-27 DIAGNOSIS — F329 Major depressive disorder, single episode, unspecified: Secondary | ICD-10-CM | POA: Diagnosis not present

## 2018-07-27 DIAGNOSIS — L89153 Pressure ulcer of sacral region, stage 3: Secondary | ICD-10-CM | POA: Diagnosis not present

## 2018-07-31 DIAGNOSIS — F329 Major depressive disorder, single episode, unspecified: Secondary | ICD-10-CM | POA: Diagnosis not present

## 2018-07-31 DIAGNOSIS — I10 Essential (primary) hypertension: Secondary | ICD-10-CM | POA: Diagnosis not present

## 2018-07-31 DIAGNOSIS — M8008XD Age-related osteoporosis with current pathological fracture, vertebra(e), subsequent encounter for fracture with routine healing: Secondary | ICD-10-CM | POA: Diagnosis not present

## 2018-07-31 DIAGNOSIS — L89153 Pressure ulcer of sacral region, stage 3: Secondary | ICD-10-CM | POA: Diagnosis not present

## 2018-07-31 DIAGNOSIS — E119 Type 2 diabetes mellitus without complications: Secondary | ICD-10-CM | POA: Diagnosis not present

## 2018-07-31 DIAGNOSIS — K5792 Diverticulitis of intestine, part unspecified, without perforation or abscess without bleeding: Secondary | ICD-10-CM | POA: Diagnosis not present

## 2018-08-02 DIAGNOSIS — K5792 Diverticulitis of intestine, part unspecified, without perforation or abscess without bleeding: Secondary | ICD-10-CM | POA: Diagnosis not present

## 2018-08-02 DIAGNOSIS — F329 Major depressive disorder, single episode, unspecified: Secondary | ICD-10-CM | POA: Diagnosis not present

## 2018-08-02 DIAGNOSIS — M8008XD Age-related osteoporosis with current pathological fracture, vertebra(e), subsequent encounter for fracture with routine healing: Secondary | ICD-10-CM | POA: Diagnosis not present

## 2018-08-02 DIAGNOSIS — L89153 Pressure ulcer of sacral region, stage 3: Secondary | ICD-10-CM | POA: Diagnosis not present

## 2018-08-02 DIAGNOSIS — E119 Type 2 diabetes mellitus without complications: Secondary | ICD-10-CM | POA: Diagnosis not present

## 2018-08-02 DIAGNOSIS — I10 Essential (primary) hypertension: Secondary | ICD-10-CM | POA: Diagnosis not present

## 2018-08-03 DIAGNOSIS — E119 Type 2 diabetes mellitus without complications: Secondary | ICD-10-CM | POA: Diagnosis not present

## 2018-08-03 DIAGNOSIS — I10 Essential (primary) hypertension: Secondary | ICD-10-CM | POA: Diagnosis not present

## 2018-08-03 DIAGNOSIS — M8008XD Age-related osteoporosis with current pathological fracture, vertebra(e), subsequent encounter for fracture with routine healing: Secondary | ICD-10-CM | POA: Diagnosis not present

## 2018-08-03 DIAGNOSIS — K5792 Diverticulitis of intestine, part unspecified, without perforation or abscess without bleeding: Secondary | ICD-10-CM | POA: Diagnosis not present

## 2018-08-03 DIAGNOSIS — F329 Major depressive disorder, single episode, unspecified: Secondary | ICD-10-CM | POA: Diagnosis not present

## 2018-08-03 DIAGNOSIS — L89153 Pressure ulcer of sacral region, stage 3: Secondary | ICD-10-CM | POA: Diagnosis not present

## 2018-08-07 ENCOUNTER — Telehealth (HOSPITAL_COMMUNITY): Payer: Self-pay

## 2018-08-07 DIAGNOSIS — K5792 Diverticulitis of intestine, part unspecified, without perforation or abscess without bleeding: Secondary | ICD-10-CM | POA: Diagnosis not present

## 2018-08-07 DIAGNOSIS — L89153 Pressure ulcer of sacral region, stage 3: Secondary | ICD-10-CM | POA: Diagnosis not present

## 2018-08-07 DIAGNOSIS — M8008XD Age-related osteoporosis with current pathological fracture, vertebra(e), subsequent encounter for fracture with routine healing: Secondary | ICD-10-CM | POA: Diagnosis not present

## 2018-08-07 DIAGNOSIS — E119 Type 2 diabetes mellitus without complications: Secondary | ICD-10-CM | POA: Diagnosis not present

## 2018-08-07 DIAGNOSIS — F329 Major depressive disorder, single episode, unspecified: Secondary | ICD-10-CM | POA: Diagnosis not present

## 2018-08-07 DIAGNOSIS — I10 Essential (primary) hypertension: Secondary | ICD-10-CM | POA: Diagnosis not present

## 2018-08-07 NOTE — Telephone Encounter (Signed)
Called to schedule consult, no answer, no vm. AW  

## 2018-08-09 DIAGNOSIS — I10 Essential (primary) hypertension: Secondary | ICD-10-CM | POA: Diagnosis not present

## 2018-08-09 DIAGNOSIS — F329 Major depressive disorder, single episode, unspecified: Secondary | ICD-10-CM | POA: Diagnosis not present

## 2018-08-09 DIAGNOSIS — K5792 Diverticulitis of intestine, part unspecified, without perforation or abscess without bleeding: Secondary | ICD-10-CM | POA: Diagnosis not present

## 2018-08-09 DIAGNOSIS — E119 Type 2 diabetes mellitus without complications: Secondary | ICD-10-CM | POA: Diagnosis not present

## 2018-08-09 DIAGNOSIS — L89153 Pressure ulcer of sacral region, stage 3: Secondary | ICD-10-CM | POA: Diagnosis not present

## 2018-08-09 DIAGNOSIS — M8008XD Age-related osteoporosis with current pathological fracture, vertebra(e), subsequent encounter for fracture with routine healing: Secondary | ICD-10-CM | POA: Diagnosis not present

## 2018-08-29 DIAGNOSIS — M25561 Pain in right knee: Secondary | ICD-10-CM | POA: Diagnosis not present

## 2018-08-29 DIAGNOSIS — M1712 Unilateral primary osteoarthritis, left knee: Secondary | ICD-10-CM | POA: Diagnosis not present

## 2018-08-29 DIAGNOSIS — M1711 Unilateral primary osteoarthritis, right knee: Secondary | ICD-10-CM | POA: Diagnosis not present

## 2018-09-05 DIAGNOSIS — M4854XD Collapsed vertebra, not elsewhere classified, thoracic region, subsequent encounter for fracture with routine healing: Secondary | ICD-10-CM | POA: Diagnosis not present

## 2018-09-05 DIAGNOSIS — M545 Low back pain: Secondary | ICD-10-CM | POA: Diagnosis not present

## 2018-09-05 DIAGNOSIS — G894 Chronic pain syndrome: Secondary | ICD-10-CM | POA: Diagnosis not present

## 2018-09-05 DIAGNOSIS — M961 Postlaminectomy syndrome, not elsewhere classified: Secondary | ICD-10-CM | POA: Diagnosis not present

## 2018-09-05 DIAGNOSIS — M5030 Other cervical disc degeneration, unspecified cervical region: Secondary | ICD-10-CM | POA: Diagnosis not present

## 2018-09-05 DIAGNOSIS — M5136 Other intervertebral disc degeneration, lumbar region: Secondary | ICD-10-CM | POA: Diagnosis not present

## 2018-09-05 DIAGNOSIS — Z5181 Encounter for therapeutic drug level monitoring: Secondary | ICD-10-CM | POA: Diagnosis not present

## 2018-09-05 DIAGNOSIS — M542 Cervicalgia: Secondary | ICD-10-CM | POA: Diagnosis not present

## 2018-09-05 DIAGNOSIS — Z79891 Long term (current) use of opiate analgesic: Secondary | ICD-10-CM | POA: Diagnosis not present

## 2018-09-05 DIAGNOSIS — Z79899 Other long term (current) drug therapy: Secondary | ICD-10-CM | POA: Diagnosis not present

## 2018-09-06 ENCOUNTER — Other Ambulatory Visit (HOSPITAL_COMMUNITY): Payer: Self-pay | Admitting: *Deleted

## 2018-09-07 ENCOUNTER — Inpatient Hospital Stay (HOSPITAL_COMMUNITY): Admission: RE | Admit: 2018-09-07 | Payer: Medicare Other | Source: Ambulatory Visit

## 2018-09-08 DIAGNOSIS — Z6822 Body mass index (BMI) 22.0-22.9, adult: Secondary | ICD-10-CM | POA: Diagnosis not present

## 2018-09-08 DIAGNOSIS — I10 Essential (primary) hypertension: Secondary | ICD-10-CM | POA: Diagnosis not present

## 2018-09-08 DIAGNOSIS — M5489 Other dorsalgia: Secondary | ICD-10-CM | POA: Diagnosis not present

## 2018-09-08 DIAGNOSIS — M810 Age-related osteoporosis without current pathological fracture: Secondary | ICD-10-CM | POA: Diagnosis not present

## 2018-09-08 DIAGNOSIS — L89153 Pressure ulcer of sacral region, stage 3: Secondary | ICD-10-CM | POA: Diagnosis not present

## 2018-09-08 DIAGNOSIS — E1169 Type 2 diabetes mellitus with other specified complication: Secondary | ICD-10-CM | POA: Diagnosis not present

## 2018-09-08 DIAGNOSIS — I519 Heart disease, unspecified: Secondary | ICD-10-CM | POA: Diagnosis not present

## 2018-09-08 DIAGNOSIS — M1990 Unspecified osteoarthritis, unspecified site: Secondary | ICD-10-CM | POA: Diagnosis not present

## 2018-09-08 DIAGNOSIS — E7849 Other hyperlipidemia: Secondary | ICD-10-CM | POA: Diagnosis not present

## 2018-09-11 ENCOUNTER — Ambulatory Visit (HOSPITAL_COMMUNITY)
Admission: RE | Admit: 2018-09-11 | Discharge: 2018-09-11 | Disposition: A | Discharge status: Home / Self Care | Payer: Medicare Other | Source: Ambulatory Visit | Attending: Internal Medicine | Admitting: Internal Medicine

## 2018-09-11 DIAGNOSIS — M810 Age-related osteoporosis without current pathological fracture: Secondary | ICD-10-CM | POA: Diagnosis not present

## 2018-09-11 MED ORDER — DENOSUMAB 60 MG/ML ~~LOC~~ SOSY
PREFILLED_SYRINGE | SUBCUTANEOUS | Status: AC
  Administered 2018-09-11: 60 mg via SUBCUTANEOUS
  Filled 2018-09-11: qty 1

## 2018-09-11 MED ORDER — DENOSUMAB 60 MG/ML ~~LOC~~ SOSY
60.0000 mg | PREFILLED_SYRINGE | Freq: Once | SUBCUTANEOUS | Status: AC
  Administered 2018-09-11: 60 mg via SUBCUTANEOUS

## 2018-09-19 DIAGNOSIS — M25561 Pain in right knee: Secondary | ICD-10-CM | POA: Diagnosis not present

## 2018-09-19 DIAGNOSIS — M1711 Unilateral primary osteoarthritis, right knee: Secondary | ICD-10-CM | POA: Diagnosis not present

## 2018-09-26 DIAGNOSIS — M25561 Pain in right knee: Secondary | ICD-10-CM | POA: Diagnosis not present

## 2018-09-26 DIAGNOSIS — M1711 Unilateral primary osteoarthritis, right knee: Secondary | ICD-10-CM | POA: Diagnosis not present

## 2018-10-03 DIAGNOSIS — M1711 Unilateral primary osteoarthritis, right knee: Secondary | ICD-10-CM | POA: Diagnosis not present

## 2018-10-03 DIAGNOSIS — M25561 Pain in right knee: Secondary | ICD-10-CM | POA: Diagnosis not present

## 2018-11-13 DIAGNOSIS — M25561 Pain in right knee: Secondary | ICD-10-CM | POA: Diagnosis not present

## 2018-11-13 DIAGNOSIS — M1711 Unilateral primary osteoarthritis, right knee: Secondary | ICD-10-CM | POA: Diagnosis not present

## 2018-11-13 DIAGNOSIS — S8001XA Contusion of right knee, initial encounter: Secondary | ICD-10-CM | POA: Diagnosis not present

## 2018-12-05 DIAGNOSIS — M5136 Other intervertebral disc degeneration, lumbar region: Secondary | ICD-10-CM | POA: Diagnosis not present

## 2018-12-05 DIAGNOSIS — M961 Postlaminectomy syndrome, not elsewhere classified: Secondary | ICD-10-CM | POA: Diagnosis not present

## 2018-12-05 DIAGNOSIS — M542 Cervicalgia: Secondary | ICD-10-CM | POA: Diagnosis not present

## 2018-12-05 DIAGNOSIS — M545 Low back pain: Secondary | ICD-10-CM | POA: Diagnosis not present

## 2018-12-14 ENCOUNTER — Encounter (INDEPENDENT_AMBULATORY_CARE_PROVIDER_SITE_OTHER): Payer: Medicare Other | Admitting: Ophthalmology

## 2019-01-08 ENCOUNTER — Other Ambulatory Visit: Payer: Self-pay

## 2019-01-08 ENCOUNTER — Encounter (INDEPENDENT_AMBULATORY_CARE_PROVIDER_SITE_OTHER): Payer: Medicare Other | Admitting: Ophthalmology

## 2019-01-08 DIAGNOSIS — I10 Essential (primary) hypertension: Secondary | ICD-10-CM

## 2019-01-08 DIAGNOSIS — H59031 Cystoid macular edema following cataract surgery, right eye: Secondary | ICD-10-CM

## 2019-01-08 DIAGNOSIS — H43811 Vitreous degeneration, right eye: Secondary | ICD-10-CM | POA: Diagnosis not present

## 2019-01-08 DIAGNOSIS — H35033 Hypertensive retinopathy, bilateral: Secondary | ICD-10-CM

## 2019-01-17 DIAGNOSIS — Z961 Presence of intraocular lens: Secondary | ICD-10-CM | POA: Diagnosis not present

## 2019-01-17 DIAGNOSIS — H18221 Idiopathic corneal edema, right eye: Secondary | ICD-10-CM | POA: Diagnosis not present

## 2019-01-17 DIAGNOSIS — H401133 Primary open-angle glaucoma, bilateral, severe stage: Secondary | ICD-10-CM | POA: Diagnosis not present

## 2019-02-20 DIAGNOSIS — Z961 Presence of intraocular lens: Secondary | ICD-10-CM | POA: Diagnosis not present

## 2019-02-20 DIAGNOSIS — H353122 Nonexudative age-related macular degeneration, left eye, intermediate dry stage: Secondary | ICD-10-CM | POA: Diagnosis not present

## 2019-02-20 DIAGNOSIS — H18221 Idiopathic corneal edema, right eye: Secondary | ICD-10-CM | POA: Diagnosis not present

## 2019-02-20 DIAGNOSIS — H401133 Primary open-angle glaucoma, bilateral, severe stage: Secondary | ICD-10-CM | POA: Diagnosis not present

## 2019-03-02 DIAGNOSIS — M5489 Other dorsalgia: Secondary | ICD-10-CM | POA: Diagnosis not present

## 2019-03-02 DIAGNOSIS — M4850XA Collapsed vertebra, not elsewhere classified, site unspecified, initial encounter for fracture: Secondary | ICD-10-CM | POA: Diagnosis not present

## 2019-03-02 DIAGNOSIS — K219 Gastro-esophageal reflux disease without esophagitis: Secondary | ICD-10-CM | POA: Diagnosis not present

## 2019-03-02 DIAGNOSIS — E1122 Type 2 diabetes mellitus with diabetic chronic kidney disease: Secondary | ICD-10-CM | POA: Diagnosis not present

## 2019-03-02 DIAGNOSIS — I519 Heart disease, unspecified: Secondary | ICD-10-CM | POA: Diagnosis not present

## 2019-03-02 DIAGNOSIS — E1169 Type 2 diabetes mellitus with other specified complication: Secondary | ICD-10-CM | POA: Diagnosis not present

## 2019-03-02 DIAGNOSIS — R809 Proteinuria, unspecified: Secondary | ICD-10-CM | POA: Diagnosis not present

## 2019-03-02 DIAGNOSIS — L89153 Pressure ulcer of sacral region, stage 3: Secondary | ICD-10-CM | POA: Diagnosis not present

## 2019-03-02 DIAGNOSIS — I129 Hypertensive chronic kidney disease with stage 1 through stage 4 chronic kidney disease, or unspecified chronic kidney disease: Secondary | ICD-10-CM | POA: Diagnosis not present

## 2019-03-02 DIAGNOSIS — N182 Chronic kidney disease, stage 2 (mild): Secondary | ICD-10-CM | POA: Diagnosis not present

## 2019-03-02 DIAGNOSIS — M810 Age-related osteoporosis without current pathological fracture: Secondary | ICD-10-CM | POA: Diagnosis not present

## 2019-03-02 DIAGNOSIS — F329 Major depressive disorder, single episode, unspecified: Secondary | ICD-10-CM | POA: Diagnosis not present

## 2019-03-12 ENCOUNTER — Other Ambulatory Visit: Payer: Self-pay

## 2019-03-12 ENCOUNTER — Encounter (INDEPENDENT_AMBULATORY_CARE_PROVIDER_SITE_OTHER): Payer: Medicare Other | Admitting: Ophthalmology

## 2019-03-12 DIAGNOSIS — H35033 Hypertensive retinopathy, bilateral: Secondary | ICD-10-CM | POA: Diagnosis not present

## 2019-03-12 DIAGNOSIS — I10 Essential (primary) hypertension: Secondary | ICD-10-CM

## 2019-03-12 DIAGNOSIS — H43813 Vitreous degeneration, bilateral: Secondary | ICD-10-CM | POA: Diagnosis not present

## 2019-03-12 DIAGNOSIS — H59031 Cystoid macular edema following cataract surgery, right eye: Secondary | ICD-10-CM | POA: Diagnosis not present

## 2019-03-19 DIAGNOSIS — G894 Chronic pain syndrome: Secondary | ICD-10-CM | POA: Diagnosis not present

## 2019-03-19 DIAGNOSIS — M546 Pain in thoracic spine: Secondary | ICD-10-CM | POA: Diagnosis not present

## 2019-03-20 ENCOUNTER — Other Ambulatory Visit (HOSPITAL_COMMUNITY): Payer: Self-pay

## 2019-03-21 ENCOUNTER — Inpatient Hospital Stay (HOSPITAL_COMMUNITY)
Admission: RE | Admit: 2019-03-21 | Discharge: 2019-03-21 | Disposition: A | Discharge status: Home / Self Care | Payer: Medicare Other | Source: Ambulatory Visit | Attending: Internal Medicine | Admitting: Internal Medicine

## 2019-04-16 DIAGNOSIS — M4854XD Collapsed vertebra, not elsewhere classified, thoracic region, subsequent encounter for fracture with routine healing: Secondary | ICD-10-CM | POA: Diagnosis not present

## 2019-04-16 DIAGNOSIS — G894 Chronic pain syndrome: Secondary | ICD-10-CM | POA: Diagnosis not present

## 2019-04-16 DIAGNOSIS — M5136 Other intervertebral disc degeneration, lumbar region: Secondary | ICD-10-CM | POA: Diagnosis not present

## 2019-04-16 DIAGNOSIS — M5030 Other cervical disc degeneration, unspecified cervical region: Secondary | ICD-10-CM | POA: Diagnosis not present

## 2019-04-16 DIAGNOSIS — Z79891 Long term (current) use of opiate analgesic: Secondary | ICD-10-CM | POA: Diagnosis not present

## 2019-04-16 DIAGNOSIS — M961 Postlaminectomy syndrome, not elsewhere classified: Secondary | ICD-10-CM | POA: Diagnosis not present

## 2019-04-16 DIAGNOSIS — M545 Low back pain: Secondary | ICD-10-CM | POA: Diagnosis not present

## 2019-04-16 DIAGNOSIS — M542 Cervicalgia: Secondary | ICD-10-CM | POA: Diagnosis not present

## 2019-04-24 DIAGNOSIS — Z23 Encounter for immunization: Secondary | ICD-10-CM | POA: Diagnosis not present

## 2019-05-02 ENCOUNTER — Other Ambulatory Visit (HOSPITAL_COMMUNITY): Payer: Self-pay | Admitting: *Deleted

## 2019-05-03 ENCOUNTER — Encounter (HOSPITAL_COMMUNITY): Payer: Medicare Other

## 2019-05-21 ENCOUNTER — Encounter (INDEPENDENT_AMBULATORY_CARE_PROVIDER_SITE_OTHER): Payer: Medicare Other | Admitting: Ophthalmology

## 2019-05-21 DIAGNOSIS — H43811 Vitreous degeneration, right eye: Secondary | ICD-10-CM | POA: Diagnosis not present

## 2019-05-21 DIAGNOSIS — H35033 Hypertensive retinopathy, bilateral: Secondary | ICD-10-CM | POA: Diagnosis not present

## 2019-05-21 DIAGNOSIS — H59031 Cystoid macular edema following cataract surgery, right eye: Secondary | ICD-10-CM | POA: Diagnosis not present

## 2019-05-21 DIAGNOSIS — I10 Essential (primary) hypertension: Secondary | ICD-10-CM

## 2019-05-22 ENCOUNTER — Telehealth: Payer: Self-pay

## 2019-05-24 DIAGNOSIS — E041 Nontoxic single thyroid nodule: Secondary | ICD-10-CM | POA: Diagnosis not present

## 2019-05-24 DIAGNOSIS — E1122 Type 2 diabetes mellitus with diabetic chronic kidney disease: Secondary | ICD-10-CM | POA: Diagnosis not present

## 2019-05-24 DIAGNOSIS — E7849 Other hyperlipidemia: Secondary | ICD-10-CM | POA: Diagnosis not present

## 2019-05-28 ENCOUNTER — Other Ambulatory Visit (HOSPITAL_COMMUNITY): Payer: Self-pay | Admitting: *Deleted

## 2019-05-29 ENCOUNTER — Ambulatory Visit (HOSPITAL_COMMUNITY)
Admission: RE | Admit: 2019-05-29 | Discharge: 2019-05-29 | Disposition: A | Discharge status: Home / Self Care | Payer: Medicare Other | Source: Ambulatory Visit | Attending: Internal Medicine | Admitting: Internal Medicine

## 2019-05-29 ENCOUNTER — Other Ambulatory Visit: Payer: Self-pay

## 2019-05-29 DIAGNOSIS — M810 Age-related osteoporosis without current pathological fracture: Secondary | ICD-10-CM | POA: Diagnosis not present

## 2019-05-29 MED ORDER — DENOSUMAB 60 MG/ML ~~LOC~~ SOSY
60.0000 mg | PREFILLED_SYRINGE | Freq: Once | SUBCUTANEOUS | Status: AC
  Administered 2019-05-29: 14:00:00 60 mg via SUBCUTANEOUS

## 2019-05-29 MED ORDER — DENOSUMAB 60 MG/ML ~~LOC~~ SOSY
PREFILLED_SYRINGE | SUBCUTANEOUS | Status: AC
  Administered 2019-05-29: 60 mg via SUBCUTANEOUS
  Filled 2019-05-29: qty 1

## 2019-05-30 DIAGNOSIS — L89153 Pressure ulcer of sacral region, stage 3: Secondary | ICD-10-CM | POA: Diagnosis not present

## 2019-05-30 DIAGNOSIS — E1122 Type 2 diabetes mellitus with diabetic chronic kidney disease: Secondary | ICD-10-CM | POA: Diagnosis not present

## 2019-05-30 DIAGNOSIS — E1169 Type 2 diabetes mellitus with other specified complication: Secondary | ICD-10-CM | POA: Diagnosis not present

## 2019-05-30 DIAGNOSIS — E785 Hyperlipidemia, unspecified: Secondary | ICD-10-CM | POA: Diagnosis not present

## 2019-05-30 DIAGNOSIS — R809 Proteinuria, unspecified: Secondary | ICD-10-CM | POA: Diagnosis not present

## 2019-05-30 DIAGNOSIS — R82998 Other abnormal findings in urine: Secondary | ICD-10-CM | POA: Diagnosis not present

## 2019-05-30 DIAGNOSIS — N183 Chronic kidney disease, stage 3 unspecified: Secondary | ICD-10-CM | POA: Diagnosis not present

## 2019-05-30 DIAGNOSIS — I129 Hypertensive chronic kidney disease with stage 1 through stage 4 chronic kidney disease, or unspecified chronic kidney disease: Secondary | ICD-10-CM | POA: Diagnosis not present

## 2019-05-30 DIAGNOSIS — F329 Major depressive disorder, single episode, unspecified: Secondary | ICD-10-CM | POA: Diagnosis not present

## 2019-05-30 DIAGNOSIS — I10 Essential (primary) hypertension: Secondary | ICD-10-CM | POA: Diagnosis not present

## 2019-05-30 DIAGNOSIS — I519 Heart disease, unspecified: Secondary | ICD-10-CM | POA: Diagnosis not present

## 2019-05-30 DIAGNOSIS — Z1331 Encounter for screening for depression: Secondary | ICD-10-CM | POA: Diagnosis not present

## 2019-06-20 DIAGNOSIS — H353122 Nonexudative age-related macular degeneration, left eye, intermediate dry stage: Secondary | ICD-10-CM | POA: Diagnosis not present

## 2019-06-20 DIAGNOSIS — H18221 Idiopathic corneal edema, right eye: Secondary | ICD-10-CM | POA: Diagnosis not present

## 2019-06-20 DIAGNOSIS — H401133 Primary open-angle glaucoma, bilateral, severe stage: Secondary | ICD-10-CM | POA: Diagnosis not present

## 2019-06-20 DIAGNOSIS — Z961 Presence of intraocular lens: Secondary | ICD-10-CM | POA: Diagnosis not present

## 2019-06-22 ENCOUNTER — Other Ambulatory Visit: Payer: Self-pay | Admitting: *Deleted

## 2019-06-22 NOTE — Patient Outreach (Signed)
[facility]) Care Management  06/22/2019  LAIGHLA MCMURDIE Dec 23, 1924 [id number]  RN Health Coach attempted follow up outreach screening call to patient.  Patient stated she was getting ready to eat breakfast and could I call back in a few hrs. Plan: RN will call patient again within 10 days.  [place] Care Management [phone]

## 2019-06-25 NOTE — Patient Outreach (Addendum)
[facility]) Care Management  [place]  06/25/2019   Marissa Ryan 1924/12/06 [id number]  [place] telephone call to patient.  [employer] compliance verified. Per patient she is doing good. Patient stated that her A1C is 6.4.  Patient does not check blood sugars. Patient has hypertension and is on blood pressure medications. She has not been checking her blood pressure. Patient is taking medications as prescribed. Patient meals mostly consists of dinners  made and she stated she opens a can for vegetables. RN health coach will discuss more in depth on frozen and fresh vegetables. Patient has agreed to further outreach calls.  Encounter Medications:  Outpatient Encounter Medications as of 06/22/2019  Medication Sig  . ALPRAZolam (XANAX) 0.25 MG tablet Take 1 tablet (0.25 mg total) by mouth at bedtime.  Marland Kitchen amLODipine (NORVASC) 5 MG tablet Take 5 mg by mouth daily.   Marland Kitchen aspirin EC 81 MG tablet Take 81 mg by mouth daily.  . Cinnamon 500 MG capsule Take 500 mg by mouth daily.  Marland Kitchen docusate sodium 100 MG CAPS Take 100 mg by mouth 2 (two) times daily.  Marland Kitchen gabapentin (NEURONTIN) 300 MG capsule Take 300 mg by mouth 3 (three) times daily.   Marland Kitchen guaiFENesin (MUCINEX) 600 MG 12 hr tablet Take 1 tablet (600 mg total) by mouth 2 (two) times daily. (Patient not taking: Reported on 11/26/2017)  . ipratropium (ATROVENT) 0.03 % nasal spray Place 2 sprays into both nostrils 3 (three) times daily as needed for rhinitis.  Marland Kitchen losartan (COZAAR) 100 MG tablet Take 100 mg by mouth daily with supper.  . meloxicam (MOBIC) 15 MG tablet Take 15 mg by mouth daily.  . metFORMIN (GLUCOPHAGE) 500 MG tablet Take 500 mg by mouth daily with breakfast.  . oxyCODONE-acetaminophen (PERCOCET) 10-325 MG tablet Take 1 tablet by mouth every 6 (six) hours as needed for pain. (Patient taking differently: Take 0.5-1 tablets by mouth 3 (three) times daily as needed for pain. )  . Polyethyl Glyc-Propyl Glyc PF (SYSTANE  ULTRA PF) 0.4-0.3 % SOLN Place 1-2 drops into both eyes every 6 (six) hours as needed (for irritation).  . prednisoLONE acetate (PRED FORTE) 1 % ophthalmic suspension Place 1 drop into the right eye 2 (two) times daily.  . sertraline (ZOLOFT) 50 MG tablet Take 50 mg by mouth daily.  . simvastatin (ZOCOR) 20 MG tablet Take 20 mg by mouth daily.  . sodium chloride (MURO 128) 5 % ophthalmic ointment Place 1 application into the right eye at bedtime.   No facility-administered encounter medications on file as of 06/22/2019.     Functional Status:  In your present state of health, do you have any difficulty performing the following activities: 06/22/2019  Hearing? N  Vision? Y  Comment patient does have glaucoma  Difficulty concentrating or making decisions? N  Walking or climbing stairs? Y  Comment uses a walker in the house and use a cane when going  Dressing or bathing? N  Doing errands, shopping? Y  Preparing Food and eating ? Y  Comment Patient  stated she eats dinners made and opens a can of  vegetables  Using the Toilet? N  In the past six months, have you accidently leaked urine? N  Do you have problems with loss of bowel control? N  Managing your Medications? N  Managing your Finances? N  Housekeeping or managing your Housekeeping? Y  Comment grand daughter helps  Some recent data might be hidden  Fall/Depression Screening: Fall Risk  06/22/2019  Falls in the past year? 0  Number falls in past yr: 0  Injury with Fall? 0  Risk for fall due to : Impaired balance/gait;Impaired mobility  Follow up Falls evaluation completed   PHQ 2/9 Scores 06/22/2019  PHQ - 2 Score 0   THN CM Care Plan Problem One     Most Recent Value  Care Plan Problem One  Knowledge Deficit in Self Mamagement of Hypertension  Role Documenting the Problem One  [place] for Problem One  Active  THN Long Term Goal   Patient wil  have a better understand of how hypertension can affect the  body withn the next 90 days  THN Long Term Goal Start Date  06/22/19  Interventions for Problem One Long Term Goal  RN discussed how hypertension can affect body such as stroke, kidney disease. RN sent A matter of choice. Living well with Diabetes. RN will follow up with further discussion  THN CM Short Term Goal #1   Patient will check blood pressure at least 1-2 x a week and document within the next 30 days  THN CM Short Term Goal #1 Start Date  06/22/19  Interventions for Short Term Goal #1  RN discussed checking blood pressure at intervals. RN sent a 2021 calendar book tp document in. RN will follow up with further discussion  THN CM Short Term Goal #2   Patient will verbalize receiving picture food sheet on foods high and low in sodium within the next 30 days  THN CM Short Term Goal #2 Start Date  06/22/19  Interventions for Short Term Goal #2  RN discussed foods high and low in sodium such as processed meats.RN sent a picture sheet of foods high an low in sodium. RnN will follow up with further discussion.       Assessment:  Patient takes medications as prescribed Patient does not monitor blood pressure Patient eats prepare meals and adds canned vegetables No recent falls Patient has good support from grand daughter   Plan:  RN discussed Advance Directive RN discussed monitoring blood pressure RN sent A matter of Choice Living well with Diabetes booklet RN discussed eating low sodium foods RN sent a picture sheet of foods high and low in sodium RN sent assessment and barriers letter to patient  RN will follow up within the month of February  Shaely Gadberry [place] Management [phone]

## 2019-07-23 DIAGNOSIS — H2000 Unspecified acute and subacute iridocyclitis: Secondary | ICD-10-CM | POA: Diagnosis not present

## 2019-08-01 DIAGNOSIS — H2000 Unspecified acute and subacute iridocyclitis: Secondary | ICD-10-CM | POA: Diagnosis not present

## 2019-08-01 DIAGNOSIS — H18231 Secondary corneal edema, right eye: Secondary | ICD-10-CM | POA: Diagnosis not present

## 2019-08-10 DIAGNOSIS — H20021 Recurrent acute iridocyclitis, right eye: Secondary | ICD-10-CM | POA: Diagnosis not present

## 2019-08-10 DIAGNOSIS — Z961 Presence of intraocular lens: Secondary | ICD-10-CM | POA: Diagnosis not present

## 2019-08-10 DIAGNOSIS — H16223 Keratoconjunctivitis sicca, not specified as Sjogren's, bilateral: Secondary | ICD-10-CM | POA: Diagnosis not present

## 2019-08-10 DIAGNOSIS — H401133 Primary open-angle glaucoma, bilateral, severe stage: Secondary | ICD-10-CM | POA: Diagnosis not present

## 2019-08-10 DIAGNOSIS — H18221 Idiopathic corneal edema, right eye: Secondary | ICD-10-CM | POA: Diagnosis not present

## 2019-08-10 DIAGNOSIS — H353122 Nonexudative age-related macular degeneration, left eye, intermediate dry stage: Secondary | ICD-10-CM | POA: Diagnosis not present

## 2019-08-13 DIAGNOSIS — H20021 Recurrent acute iridocyclitis, right eye: Secondary | ICD-10-CM | POA: Diagnosis not present

## 2019-08-13 DIAGNOSIS — H18221 Idiopathic corneal edema, right eye: Secondary | ICD-10-CM | POA: Diagnosis not present

## 2019-08-17 ENCOUNTER — Ambulatory Visit: Payer: Medicare Other

## 2019-08-22 DIAGNOSIS — H18221 Idiopathic corneal edema, right eye: Secondary | ICD-10-CM | POA: Diagnosis not present

## 2019-08-22 DIAGNOSIS — H20021 Recurrent acute iridocyclitis, right eye: Secondary | ICD-10-CM | POA: Diagnosis not present

## 2019-08-25 ENCOUNTER — Ambulatory Visit: Payer: Medicare Other | Attending: Internal Medicine

## 2019-08-25 DIAGNOSIS — Z23 Encounter for immunization: Secondary | ICD-10-CM | POA: Insufficient documentation

## 2019-08-25 NOTE — Progress Notes (Signed)
   Covid-19 Vaccination Clinic  Name:  Marissa Ryan    MRN: [id number] DOB: [birth year]  08/25/2019  Marissa Ryan was observed post Covid-19 immunization for 15 minutes without incidence. She was provided with Vaccine Information Sheet and instruction to access the V-Safe system.   Marissa Ryan was instructed to call 911 with any severe reactions post vaccine: Marland Kitchen Difficulty breathing  . Swelling of your face and throat  . A fast heartbeat  . A bad rash all over your body  . Dizziness and weakness    Immunizations Administered    Name Date Dose VIS Date Route   Pfizer COVID-19 Vaccine 08/25/2019  4:50 PM 0.3 mL 06/29/2019 Intramuscular   Manufacturer: [place]   Lot: [id number]   [place]: [id number]

## 2019-08-29 ENCOUNTER — Other Ambulatory Visit: Payer: Self-pay | Admitting: *Deleted

## 2019-08-29 NOTE — Patient Outreach (Signed)
[place] [facility]) Care Management  [place]  08/29/2019   Marissa Ryan 02-Sep-1924 [id number]  [place] telephone call to patient.  Hipaa compliance verified. Per patient she is doing good. Patient has not had any recent falls. Per patient she is not having any swelling in her lower extremities, fingers or abdomen. Per patient she is not adding any salt to her diet. Patient has had 1st COVID vaccine. Patient stated she has great support from her granddaughter and has no needs. Patient is able to afford food and medications. Patient has not been checking her blood pressure regularly . Patient has agreed to follow up outreach calls.  Encounter Medications:  Outpatient Encounter Medications as of 08/29/2019  Medication Sig  . ALPRAZolam (XANAX) 0.25 MG tablet Take 1 tablet (0.25 mg total) by mouth at bedtime.  Marland Kitchen amLODipine (NORVASC) 5 MG tablet Take 5 mg by mouth daily.   Marland Kitchen aspirin EC 81 MG tablet Take 81 mg by mouth daily.  . Cinnamon 500 MG capsule Take 500 mg by mouth daily.  Marland Kitchen docusate sodium 100 MG CAPS Take 100 mg by mouth 2 (two) times daily.  Marland Kitchen gabapentin (NEURONTIN) 300 MG capsule Take 300 mg by mouth 3 (three) times daily.   Marland Kitchen guaiFENesin (MUCINEX) 600 MG 12 hr tablet Take 1 tablet (600 mg total) by mouth 2 (two) times daily. (Patient not taking: Reported on 11/26/2017)  . ipratropium (ATROVENT) 0.03 % nasal spray Place 2 sprays into both nostrils 3 (three) times daily as needed for rhinitis.  Marland Kitchen losartan (COZAAR) 100 MG tablet Take 100 mg by mouth daily with supper.  . meloxicam (MOBIC) 15 MG tablet Take 15 mg by mouth daily.  . metFORMIN (GLUCOPHAGE) 500 MG tablet Take 500 mg by mouth daily with breakfast.  . oxyCODONE-acetaminophen (PERCOCET) 10-325 MG tablet Take 1 tablet by mouth every 6 (six) hours as needed for pain. (Patient taking differently: Take 0.5-1 tablets by mouth 3 (three) times daily as needed for pain. )  . Polyethyl Glyc-Propyl Glyc PF  (SYSTANE ULTRA PF) 0.4-0.3 % SOLN Place 1-2 drops into both eyes every 6 (six) hours as needed (for irritation).  . prednisoLONE acetate (PRED FORTE) 1 % ophthalmic suspension Place 1 drop into the right eye 2 (two) times daily.  . sertraline (ZOLOFT) 50 MG tablet Take 50 mg by mouth daily.  . simvastatin (ZOCOR) 20 MG tablet Take 20 mg by mouth daily.  . sodium chloride (MURO 128) 5 % ophthalmic ointment Place 1 application into the right eye at bedtime.   No facility-administered encounter medications on file as of 08/29/2019.    Functional Status:  In your present state of health, do you have any difficulty performing the following activities: 06/22/2019  Hearing? N  Vision? Y  Comment patient does have glaucoma  Difficulty concentrating or making decisions? N  Walking or climbing stairs? Y  Comment uses a walker in the house and use a cane when going  Dressing or bathing? N  Doing errands, shopping? Y  Preparing Food and eating ? Y  Comment Patient  stated she eats dinners made and opens a can of  vegetables  Using the Toilet? N  In the past six months, have you accidently leaked urine? N  Do you have problems with loss of bowel control? N  Managing your Medications? N  Managing your Finances? N  Housekeeping or managing your Housekeeping? Y  Comment grand daughter helps  Some recent data might be  hidden    Fall/Depression Screening: Fall Risk  08/29/2019 06/22/2019  Falls in the past year? 0 0  Number falls in past yr: 0 0  Injury with Fall? 0 0  Risk for fall due to : Impaired balance/gait;Impaired mobility Impaired balance/gait;Impaired mobility  Follow up Falls evaluation completed Falls evaluation completed   PHQ 2/9 Scores 06/22/2019  PHQ - 2 Score 0   THN CM Care Plan Problem One     Most Recent Value  Care Plan Problem One  Knowledge Deficit in Self Mamagement of Hypertension  Role Documenting the Problem One  [place] for Problem One  Active  THN  Long Term Goal   Patient wil  have a better understand of how hypertension can affect the body withn the next 90 days  THN Long Term Goal Start Date  08/29/19  Interventions for Problem One Long Term Goal  RN reiterates checking blood pressure and taking medications as prescribed. RN discussed some symptoms that canoccur if her blood pressure is high. RN will follow up with further discussion.  THN CM Short Term Goal #1   Patient will check blood pressure at least 1-2 x a week and document within the next 30 days  THN CM Short Term Goal #1 Start Date  08/29/19  Interventions for Short Term Goal #1  Rn reiterates that patient needs to check bllod pressure at intervals. patient received calendar book. RN will follow up with further discussion  THN CM Short Term Goal #2   Patient will verbalize receiving picture food sheet on foods high and low in sodium within the next 30 days  THN CM Short Term Goal #2 Met Date  08/29/19  [facility] CM Short Term Goal #3  patient will verbalize following up on Health Maintenance within the next 30 days.  THN CM Short Term Goal #3 Start Date  08/29/19  Interventions for Short Tern Goal #3  RN discussed health maintenance. Patient received 1st COVID vaccine and have scheduled the next. Patient has eye exam scheduled in May. RN will follow up for further discussion      Assessment:  Patient is not adding salt to her diet Patient is not having any hypertensive symptom Patient has adequate food and medication Patient received educational information sent by RN health Coach Patient needs encouragement and reminding to monitor diet and follow up health maintenance Plan:  RN discussed Health Maintenance Patient will get next COVID vaccine 10/07/2019 Eye exam scheduled 11/19/2019 RN discussed healthy low sodium diet RN discussed monitoring blood pressure RN will follow up within the month of May.  [place] [facility]  Management [phone]

## 2019-09-19 ENCOUNTER — Ambulatory Visit: Payer: Medicare Other | Attending: Internal Medicine

## 2019-09-19 DIAGNOSIS — Z23 Encounter for immunization: Secondary | ICD-10-CM | POA: Insufficient documentation

## 2019-09-19 NOTE — Progress Notes (Signed)
   Covid-19 Vaccination Clinic  Name:  Marissa Ryan    MRN: [id number] DOB: 26-Jun-1925  09/19/2019  [name] was observed post Covid-19 immunization for 15 minutes without incident. She was provided with Vaccine Information Sheet and instruction to access the V-Safe system.   [name] was instructed to call 911 with any severe reactions post vaccine: Marland Kitchen Difficulty breathing  . Swelling of face and throat  . A fast heartbeat  . A bad rash all over body  . Dizziness and weakness   Immunizations Administered    Name Date Dose VIS Date Route   Pfizer COVID-19 Vaccine 09/19/2019  1:19 PM 0.3 mL 06/29/2019 Intramuscular   Manufacturer: [place]   Lot: [id number]   [place]: [id number]

## 2019-10-01 DIAGNOSIS — L89153 Pressure ulcer of sacral region, stage 3: Secondary | ICD-10-CM | POA: Diagnosis not present

## 2019-10-01 DIAGNOSIS — M810 Age-related osteoporosis without current pathological fracture: Secondary | ICD-10-CM | POA: Diagnosis not present

## 2019-10-01 DIAGNOSIS — E1169 Type 2 diabetes mellitus with other specified complication: Secondary | ICD-10-CM | POA: Diagnosis not present

## 2019-10-01 DIAGNOSIS — F329 Major depressive disorder, single episode, unspecified: Secondary | ICD-10-CM | POA: Diagnosis not present

## 2019-10-01 DIAGNOSIS — E785 Hyperlipidemia, unspecified: Secondary | ICD-10-CM | POA: Diagnosis not present

## 2019-10-01 DIAGNOSIS — I519 Heart disease, unspecified: Secondary | ICD-10-CM | POA: Diagnosis not present

## 2019-10-01 DIAGNOSIS — I129 Hypertensive chronic kidney disease with stage 1 through stage 4 chronic kidney disease, or unspecified chronic kidney disease: Secondary | ICD-10-CM | POA: Diagnosis not present

## 2019-10-01 DIAGNOSIS — N1831 Chronic kidney disease, stage 3a: Secondary | ICD-10-CM | POA: Diagnosis not present

## 2019-10-01 DIAGNOSIS — M5489 Other dorsalgia: Secondary | ICD-10-CM | POA: Diagnosis not present

## 2019-10-01 DIAGNOSIS — E1122 Type 2 diabetes mellitus with diabetic chronic kidney disease: Secondary | ICD-10-CM | POA: Diagnosis not present

## 2019-10-01 DIAGNOSIS — K589 Irritable bowel syndrome without diarrhea: Secondary | ICD-10-CM | POA: Diagnosis not present

## 2019-10-02 DIAGNOSIS — M5136 Other intervertebral disc degeneration, lumbar region: Secondary | ICD-10-CM | POA: Diagnosis not present

## 2019-10-02 DIAGNOSIS — Z79891 Long term (current) use of opiate analgesic: Secondary | ICD-10-CM | POA: Diagnosis not present

## 2019-10-02 DIAGNOSIS — M542 Cervicalgia: Secondary | ICD-10-CM | POA: Diagnosis not present

## 2019-10-02 DIAGNOSIS — M545 Low back pain: Secondary | ICD-10-CM | POA: Diagnosis not present

## 2019-10-02 DIAGNOSIS — M961 Postlaminectomy syndrome, not elsewhere classified: Secondary | ICD-10-CM | POA: Diagnosis not present

## 2019-10-10 DIAGNOSIS — F329 Major depressive disorder, single episode, unspecified: Secondary | ICD-10-CM | POA: Diagnosis not present

## 2019-10-10 DIAGNOSIS — N183 Chronic kidney disease, stage 3 unspecified: Secondary | ICD-10-CM | POA: Diagnosis not present

## 2019-10-10 DIAGNOSIS — K589 Irritable bowel syndrome without diarrhea: Secondary | ICD-10-CM | POA: Diagnosis not present

## 2019-10-10 DIAGNOSIS — E785 Hyperlipidemia, unspecified: Secondary | ICD-10-CM | POA: Diagnosis not present

## 2019-10-10 DIAGNOSIS — E1169 Type 2 diabetes mellitus with other specified complication: Secondary | ICD-10-CM | POA: Diagnosis not present

## 2019-10-10 DIAGNOSIS — R809 Proteinuria, unspecified: Secondary | ICD-10-CM | POA: Diagnosis not present

## 2019-10-10 DIAGNOSIS — M810 Age-related osteoporosis without current pathological fracture: Secondary | ICD-10-CM | POA: Diagnosis not present

## 2019-10-10 DIAGNOSIS — K219 Gastro-esophageal reflux disease without esophagitis: Secondary | ICD-10-CM | POA: Diagnosis not present

## 2019-10-10 DIAGNOSIS — M5489 Other dorsalgia: Secondary | ICD-10-CM | POA: Diagnosis not present

## 2019-10-10 DIAGNOSIS — I10 Essential (primary) hypertension: Secondary | ICD-10-CM | POA: Diagnosis not present

## 2019-10-10 DIAGNOSIS — E1122 Type 2 diabetes mellitus with diabetic chronic kidney disease: Secondary | ICD-10-CM | POA: Diagnosis not present

## 2019-10-10 DIAGNOSIS — I519 Heart disease, unspecified: Secondary | ICD-10-CM | POA: Diagnosis not present

## 2019-10-22 DIAGNOSIS — H18221 Idiopathic corneal edema, right eye: Secondary | ICD-10-CM | POA: Diagnosis not present

## 2019-10-22 DIAGNOSIS — H16223 Keratoconjunctivitis sicca, not specified as Sjogren's, bilateral: Secondary | ICD-10-CM | POA: Diagnosis not present

## 2019-10-22 DIAGNOSIS — H353122 Nonexudative age-related macular degeneration, left eye, intermediate dry stage: Secondary | ICD-10-CM | POA: Diagnosis not present

## 2019-10-22 DIAGNOSIS — Z961 Presence of intraocular lens: Secondary | ICD-10-CM | POA: Diagnosis not present

## 2019-10-22 DIAGNOSIS — H20021 Recurrent acute iridocyclitis, right eye: Secondary | ICD-10-CM | POA: Diagnosis not present

## 2019-10-22 DIAGNOSIS — H401133 Primary open-angle glaucoma, bilateral, severe stage: Secondary | ICD-10-CM | POA: Diagnosis not present

## 2019-10-30 ENCOUNTER — Encounter (INDEPENDENT_AMBULATORY_CARE_PROVIDER_SITE_OTHER): Payer: Medicare Other | Admitting: Ophthalmology

## 2019-11-19 ENCOUNTER — Encounter (INDEPENDENT_AMBULATORY_CARE_PROVIDER_SITE_OTHER): Payer: Medicare Other | Admitting: Ophthalmology

## 2019-11-19 DIAGNOSIS — H353112 Nonexudative age-related macular degeneration, right eye, intermediate dry stage: Secondary | ICD-10-CM | POA: Diagnosis not present

## 2019-11-19 DIAGNOSIS — H18221 Idiopathic corneal edema, right eye: Secondary | ICD-10-CM

## 2019-11-19 DIAGNOSIS — I10 Essential (primary) hypertension: Secondary | ICD-10-CM

## 2019-11-19 DIAGNOSIS — H35033 Hypertensive retinopathy, bilateral: Secondary | ICD-10-CM

## 2019-11-19 DIAGNOSIS — H43813 Vitreous degeneration, bilateral: Secondary | ICD-10-CM | POA: Diagnosis not present

## 2019-11-19 DIAGNOSIS — H35372 Puckering of macula, left eye: Secondary | ICD-10-CM | POA: Diagnosis not present

## 2019-11-27 ENCOUNTER — Other Ambulatory Visit: Payer: Self-pay | Admitting: *Deleted

## 2019-11-27 NOTE — Patient Outreach (Signed)
Marissa Ryan) Care Management  Marissa Ryan  11/27/2019   Marissa Ryan 1925/01/15 [id number] [place] telephone call to patient.  Hipaa compliance verified. Per patient she does not check blood pressure daily but at random intervals. Last Bp was 160/60. Patient is not having any lower extremity swelling. Patient is taking medications as per ordered. Patient has not had any recent falls. Patient has received her COVID vaccines. Per patient she is not doing a routine exercise but she does get up and walk in her house. Patient appetite is good. She is trying to adhere to low sodium diet. Patient has agreed to follow up outreach calls.  Encounter Medications:  Outpatient Encounter Medications as of 11/27/2019  Medication Sig  . ALPRAZolam (XANAX) 0.25 MG tablet Take 1 tablet (0.25 mg total) by mouth at bedtime.  Marland Kitchen amLODipine (NORVASC) 5 MG tablet Take 5 mg by mouth daily.   Marland Kitchen aspirin EC 81 MG tablet Take 81 mg by mouth daily.  . Cinnamon 500 MG capsule Take 500 mg by mouth daily.  Marland Kitchen docusate sodium 100 MG CAPS Take 100 mg by mouth 2 (two) times daily.  Marland Kitchen gabapentin (NEURONTIN) 300 MG capsule Take 300 mg by mouth 3 (three) times daily.   Marland Kitchen guaiFENesin (MUCINEX) 600 MG 12 hr tablet Take 1 tablet (600 mg total) by mouth 2 (two) times daily. (Patient not taking: Reported on 11/26/2017)  . ipratropium (ATROVENT) 0.03 % nasal spray Place 2 sprays into both nostrils 3 (three) times daily as needed for rhinitis.  Marland Kitchen losartan (COZAAR) 100 MG tablet Take 100 mg by mouth daily with supper.  . meloxicam (MOBIC) 15 MG tablet Take 15 mg by mouth daily.  . metFORMIN (GLUCOPHAGE) 500 MG tablet Take 500 mg by mouth daily with breakfast.  . oxyCODONE-acetaminophen (PERCOCET) 10-325 MG tablet Take 1 tablet by mouth every 6 (six) hours as needed for pain. (Patient taking differently: Take 0.5-1 tablets by mouth 3 (three) times daily as needed for pain. )  . Polyethyl Glyc-Propyl Glyc PF  (SYSTANE ULTRA PF) 0.4-0.3 % SOLN Place 1-2 drops into both eyes every 6 (six) hours as needed (for irritation).  . prednisoLONE acetate (PRED FORTE) 1 % ophthalmic suspension Place 1 drop into the right eye 2 (two) times daily.  . sertraline (ZOLOFT) 50 MG tablet Take 50 mg by mouth daily.  . simvastatin (ZOCOR) 20 MG tablet Take 20 mg by mouth daily.  . sodium chloride (MURO 128) 5 % ophthalmic ointment Place 1 application into the right eye at bedtime.   No facility-administered encounter medications on file as of 11/27/2019.    Functional Status:  In your present state of health, do you have any difficulty performing the following activities: 06/22/2019  Hearing? N  Vision? Y  Comment patient does have glaucoma  Difficulty concentrating or making decisions? N  Walking or climbing stairs? Y  Comment uses a walker in the house and use a cane when going  Dressing or bathing? N  Doing errands, shopping? Y  Preparing Food and eating ? Y  Comment Patient  stated she eats dinners made and opens a can of  vegetables  Using the Toilet? N  In the past six months, have you accidently leaked urine? N  Do you have problems with loss of bowel control? N  Managing your Medications? N  Managing your Finances? N  Housekeeping or managing your Housekeeping? Y  Comment grand daughter helps  Some recent data might be  hidden    Fall/Depression Screening: Fall Risk  11/27/2019 08/29/2019 06/22/2019  Falls in the past year? 0 0 0  Number falls in past yr: 0 0 0  Injury with Fall? 0 0 0  Risk for fall due to : Impaired balance/gait;Impaired mobility Impaired balance/gait;Impaired mobility Impaired balance/gait;Impaired mobility  Follow up Falls evaluation completed;Falls prevention discussed Falls evaluation completed Falls evaluation completed   PHQ 2/9 Scores 06/22/2019  PHQ - 2 Score 0    Assessment:  BP 160/60 at last random blood  Pressure check No lower extremity swelling No recent  falls Patient has received COVID vaccine Patient is adhering to low sodium diet Plan:  RN discussed patient current readings RN sent Clinical Key education on How to take your blood pressure RN sent Clinical  Key education on Managing your Hypertension RN discussed an easy exercise routine.  RN sent Exercise Program activity booklet RN will follow up outreach within the month of August  Rocco Kerkhoff [place] [facility] Management [phone]

## 2019-12-05 DIAGNOSIS — H548 Legal blindness, as defined in USA: Secondary | ICD-10-CM | POA: Diagnosis not present

## 2019-12-05 DIAGNOSIS — H5203 Hypermetropia, bilateral: Secondary | ICD-10-CM | POA: Diagnosis not present

## 2019-12-05 DIAGNOSIS — H353122 Nonexudative age-related macular degeneration, left eye, intermediate dry stage: Secondary | ICD-10-CM | POA: Diagnosis not present

## 2019-12-05 DIAGNOSIS — Z961 Presence of intraocular lens: Secondary | ICD-10-CM | POA: Diagnosis not present

## 2019-12-27 ENCOUNTER — Other Ambulatory Visit (HOSPITAL_COMMUNITY): Payer: Self-pay

## 2019-12-28 ENCOUNTER — Other Ambulatory Visit: Payer: Self-pay

## 2019-12-28 ENCOUNTER — Ambulatory Visit (HOSPITAL_COMMUNITY)
Admission: RE | Admit: 2019-12-28 | Discharge: 2019-12-28 | Disposition: A | Discharge status: Home / Self Care | Payer: Medicare Other | Source: Ambulatory Visit | Attending: Internal Medicine | Admitting: Internal Medicine

## 2019-12-28 DIAGNOSIS — M810 Age-related osteoporosis without current pathological fracture: Secondary | ICD-10-CM | POA: Diagnosis not present

## 2019-12-28 MED ORDER — DENOSUMAB 60 MG/ML ~~LOC~~ SOSY: 60.0000 mg | PREFILLED_SYRINGE | Freq: Once | SUBCUTANEOUS | Status: DC

## 2019-12-28 MED ORDER — DENOSUMAB 60 MG/ML ~~LOC~~ SOSY
PREFILLED_SYRINGE | SUBCUTANEOUS | Status: AC
  Administered 2019-12-28: 60 mg
  Filled 2019-12-28: qty 1

## 2020-01-29 ENCOUNTER — Emergency Department (HOSPITAL_COMMUNITY): Payer: Medicare Other

## 2020-01-29 ENCOUNTER — Encounter (HOSPITAL_COMMUNITY): Payer: Self-pay

## 2020-01-29 ENCOUNTER — Emergency Department (HOSPITAL_COMMUNITY)
Admission: EM | Admit: 2020-01-29 | Discharge: 2020-01-29 | Disposition: A | Discharge status: Home / Self Care | Payer: Medicare Other | Attending: Emergency Medicine | Admitting: Emergency Medicine

## 2020-01-29 ENCOUNTER — Other Ambulatory Visit: Payer: Self-pay

## 2020-01-29 DIAGNOSIS — R52 Pain, unspecified: Secondary | ICD-10-CM | POA: Diagnosis not present

## 2020-01-29 DIAGNOSIS — G8929 Other chronic pain: Secondary | ICD-10-CM | POA: Diagnosis not present

## 2020-01-29 DIAGNOSIS — M47812 Spondylosis without myelopathy or radiculopathy, cervical region: Secondary | ICD-10-CM | POA: Diagnosis not present

## 2020-01-29 DIAGNOSIS — G4489 Other headache syndrome: Secondary | ICD-10-CM | POA: Diagnosis not present

## 2020-01-29 DIAGNOSIS — M4312 Spondylolisthesis, cervical region: Secondary | ICD-10-CM | POA: Diagnosis not present

## 2020-01-29 DIAGNOSIS — R519 Headache, unspecified: Secondary | ICD-10-CM | POA: Diagnosis not present

## 2020-01-29 DIAGNOSIS — Z5321 Procedure and treatment not carried out due to patient leaving prior to being seen by health care provider: Secondary | ICD-10-CM | POA: Diagnosis not present

## 2020-01-29 LAB — BASIC METABOLIC PANEL
Anion gap: 10 (ref 5–15)
BUN: 22 mg/dL (ref 8–23)
CO2: 22 mmol/L (ref 22–32)
Calcium: 9.2 mg/dL (ref 8.9–10.3)
Chloride: 104 mmol/L (ref 98–111)
Creatinine, Ser: 0.8 mg/dL (ref 0.44–1.00)
GFR calc Af Amer: >60 mL/min (ref >60)
GFR calc non Af Amer: >60 mL/min (ref >60)
Glucose, Bld: 187 mg/dL — ABNORMAL HIGH (ref 70–99)
Potassium: 4.5 mmol/L (ref 3.5–5.1)
Sodium: 136 mmol/L (ref 135–145)

## 2020-01-29 LAB — CBC
HCT: 38.3 % (ref 36.0–46.0)
Hemoglobin: 12.3 g/dL (ref 12.0–15.0)
MCH: 31.1 pg (ref 26.0–34.0)
MCHC: 32.1 g/dL (ref 30.0–36.0)
MCV: 97 fL (ref 80.0–100.0)
Platelets: 288 10*3/uL (ref 150–400)
RBC: 3.95 MIL/uL (ref 3.87–5.11)
RDW: 13.2 % (ref 11.5–15.5)
WBC: 9.3 10*3/uL (ref 4.0–10.5)
nRBC: 0 % (ref 0.0–0.2)

## 2020-01-29 MED ORDER — SODIUM CHLORIDE 0.9% FLUSH: 3.0000 mL | Freq: Once | INTRAVENOUS | Status: DC

## 2020-01-29 NOTE — ED Triage Notes (Signed)
Pt presents to ED from home via EMS with complaints of chronic pain to back of head that has gotten worse over the last few days. PCP instructed pt to come to ED. Pt denies fall, injury. PT reports taking oxycodone for pain this morning. A&o x 4.

## 2020-01-29 NOTE — ED Notes (Signed)
Pt is leaving ama.  Son said he was taking her home and they didn't want to wait any longer

## 2020-02-05 DIAGNOSIS — M542 Cervicalgia: Secondary | ICD-10-CM | POA: Diagnosis not present

## 2020-02-05 DIAGNOSIS — N1831 Chronic kidney disease, stage 3a: Secondary | ICD-10-CM | POA: Diagnosis not present

## 2020-02-05 DIAGNOSIS — F329 Major depressive disorder, single episode, unspecified: Secondary | ICD-10-CM | POA: Diagnosis not present

## 2020-02-05 DIAGNOSIS — M810 Age-related osteoporosis without current pathological fracture: Secondary | ICD-10-CM | POA: Diagnosis not present

## 2020-02-05 DIAGNOSIS — M4850XA Collapsed vertebra, not elsewhere classified, site unspecified, initial encounter for fracture: Secondary | ICD-10-CM | POA: Diagnosis not present

## 2020-02-05 DIAGNOSIS — E1122 Type 2 diabetes mellitus with diabetic chronic kidney disease: Secondary | ICD-10-CM | POA: Diagnosis not present

## 2020-02-05 DIAGNOSIS — I10 Essential (primary) hypertension: Secondary | ICD-10-CM | POA: Diagnosis not present

## 2020-02-05 DIAGNOSIS — M5489 Other dorsalgia: Secondary | ICD-10-CM | POA: Diagnosis not present

## 2020-02-05 DIAGNOSIS — K219 Gastro-esophageal reflux disease without esophagitis: Secondary | ICD-10-CM | POA: Diagnosis not present

## 2020-02-05 DIAGNOSIS — I519 Heart disease, unspecified: Secondary | ICD-10-CM | POA: Diagnosis not present

## 2020-02-07 DIAGNOSIS — Z5181 Encounter for therapeutic drug level monitoring: Secondary | ICD-10-CM | POA: Diagnosis not present

## 2020-02-07 DIAGNOSIS — M47812 Spondylosis without myelopathy or radiculopathy, cervical region: Secondary | ICD-10-CM | POA: Diagnosis not present

## 2020-02-07 DIAGNOSIS — Z79899 Other long term (current) drug therapy: Secondary | ICD-10-CM | POA: Diagnosis not present

## 2020-02-27 ENCOUNTER — Other Ambulatory Visit: Payer: Self-pay | Admitting: *Deleted

## 2020-02-27 NOTE — Patient Outreach (Signed)
[facility] [facility]) Care Management  [place]  02/27/2020   ROWYNN MCWEENEY 1924-10-31 [id number]  [place] telephone call to patient.  Hipaa compliance verified. Per patient she has been having some headaches.She is going for an injection next week. Patient stated that she is using her walker more especially when she get up. Patient has not had any recent falls. Per patient her grand daughter takes her to the appointments. And does the shopping.  Per patient her appetite is good. Patient stated she is not using additional salt on her food. Last BP was 152/68. Rn discussed parameters for blood pressure. Patient has agreed to follow up outreach calls.   Encounter Medications:  Outpatient Encounter Medications as of 02/27/2020  Medication Sig  . ALPRAZolam (XANAX) 0.25 MG tablet Take 1 tablet (0.25 mg total) by mouth at bedtime.  Marland Kitchen amLODipine (NORVASC) 5 MG tablet Take 5 mg by mouth daily.   Marland Kitchen aspirin EC 81 MG tablet Take 81 mg by mouth daily.  . Cinnamon 500 MG capsule Take 500 mg by mouth daily.  Marland Kitchen docusate sodium 100 MG CAPS Take 100 mg by mouth 2 (two) times daily.  Marland Kitchen gabapentin (NEURONTIN) 300 MG capsule Take 300 mg by mouth 3 (three) times daily.   Marland Kitchen guaiFENesin (MUCINEX) 600 MG 12 hr tablet Take 1 tablet (600 mg total) by mouth 2 (two) times daily. (Patient not taking: Reported on 11/26/2017)  . ipratropium (ATROVENT) 0.03 % nasal spray Place 2 sprays into both nostrils 3 (three) times daily as needed for rhinitis.  Marland Kitchen losartan (COZAAR) 100 MG tablet Take 100 mg by mouth daily with supper.  . meloxicam (MOBIC) 15 MG tablet Take 15 mg by mouth daily.  . metFORMIN (GLUCOPHAGE) 500 MG tablet Take 500 mg by mouth daily with breakfast.  . oxyCODONE-acetaminophen (PERCOCET) 10-325 MG tablet Take 1 tablet by mouth every 6 (six) hours as needed for pain. (Patient taking differently: Take 0.5-1 tablets by mouth 3 (three) times daily as needed for pain. )  . Polyethyl  Glyc-Propyl Glyc PF (SYSTANE ULTRA PF) 0.4-0.3 % SOLN Place 1-2 drops into both eyes every 6 (six) hours as needed (for irritation).  . prednisoLONE acetate (PRED FORTE) 1 % ophthalmic suspension Place 1 drop into the right eye 2 (two) times daily.  . sertraline (ZOLOFT) 50 MG tablet Take 50 mg by mouth daily.  . simvastatin (ZOCOR) 20 MG tablet Take 20 mg by mouth daily.  . sodium chloride (MURO 128) 5 % ophthalmic ointment Place 1 application into the right eye at bedtime.   No facility-administered encounter medications on file as of 02/27/2020.    Functional Status:  In your present state of health, do you have any difficulty performing the following activities: 06/22/2019  Hearing? N  Vision? Y  Comment patient does have glaucoma  Difficulty concentrating or making decisions? N  Walking or climbing stairs? Y  Comment uses a walker in the house and use a cane when going  Dressing or bathing? N  Doing errands, shopping? Y  Preparing Food and eating ? Y  Comment Patient  stated she eats dinners made and opens a can of  vegetables  Using the Toilet? N  In the past six months, have you accidently leaked urine? N  Do you have problems with loss of bowel control? N  Managing your Medications? N  Managing your Finances? N  Housekeeping or managing your Housekeeping? Y  Comment grand daughter helps  Some recent  data might be hidden    Fall/Depression Screening: Fall Risk  02/27/2020 11/27/2019 08/29/2019  Falls in the past year? 0 0 0  Number falls in past yr: 0 0 0  Injury with Fall? 0 0 0  Risk for fall due to : Impaired balance/gait;Impaired mobility Impaired balance/gait;Impaired mobility Impaired balance/gait;Impaired mobility  Follow up Falls evaluation completed;Falls prevention discussed Falls evaluation completed;Falls prevention discussed Falls evaluation completed   PHQ 2/9 Scores 06/22/2019  PHQ - 2 Score 0   Goals Addressed            This Visit's Progress   . Blood  Pressure < 140/90       CARE PLAN ENTRY (see longtitudinal plan of care for additional care plan information)  Objective:  . Last practice recorded BP readings:  BP Readings from Last 3 Encounters:  01/29/20 (!) 169/58  12/28/19 (!) 155/65  05/29/19 (!) 147/77 .   Marland Kitchen Most recent eGFR/CrCl: No results found for: EGFR  No components found for: CRCL  Current Barriers:  Marland Kitchen Knowledge Deficits related to basic understanding of hypertension diagnosis . Knowledge Deficits related to understanding of medications prescribed for management of hypertension . Knowledge deficit related to self care management of hypertension . Knowledge deficit related to dangers of uncontrolled hypertension  Case Manager Clinical Goal(s):  Marland Kitchen Over the next 90 days, patient will verbalize understanding of plan for hypertension management . Over the next 90 days, patient will attend all scheduled medical appointments: Eye exams, PCP appointments . Over the next 90 days, patient will demonstrate improved adherence to prescribed treatment plan for hypertension as evidenced by taking all medications as prescribed, monitoring and recording blood pressure as directed, adhering to low sodium/DASH diet . Over the next 90 days, patient will demonstrate improved health management independence as evidenced by checking blood pressure as directed and notifying PCP if SBP>180 or DBP > 110, taking all medications as prescribe, and adhering to a low sodium diet as discussed. . Over the next 90 days, patient will verbalize basic understanding of hypertension disease process and self health management plan as evidenced by blood pressure less than1 40/90  Interventions:  . Evaluation of current treatment plan related to hypertension self management and patient's adherence to plan as established by provider. . Reviewed medications with patient and discussed importance of compliance . Discussed plans with patient for ongoing care management  follow up and provided patient with direct contact information for care management team . Advised patient, providing education and rationale, to monitor blood pressure daily and record, calling PCP for findings outside established parameters.  . Reviewed scheduled/upcoming provider appointments including:  . Provided educational material: Matter of Choice Blood Pressure Control . Provided educational material: Exercise Program Activity Book . Provided educational material: Clinical Key (Adult Hypertension, Preventing Hypertension, Managing Hypertension, How to Take your Blood Pressure, Edema, Easy to Read) . Provided educational material:  EMMI (Weight Loss Tips, Weight Loss Diet, Chair Exercises, Relaxation Techniques, Low Sodium Diet, DASH Diet, Controlling your Blood Pressure through Lifestyle, High Blood Pressure Health Problems, High Blood Pressure Taking Your Blood Pressure, Lowering Your Rick of High Blood Pressure, Stress, etc)  Patient Self Care Activities:  . Self administers medications as prescribed . Attends all scheduled provider appointments . Calls provider office for new concerns, questions, or BP outside discussed parameters . Monitors BP and records as discussed . Adheres to a low sodium diet/DASH diet . Increase physical activity as tolerated  Follow up outreach within the month  of November          Assessment:  BP 152/68 Low sodium diet Patient has been following up with eye care Appetite good  Plan:  Patient will document blood pressures and take to Dr appointment Patient will follow up with Dr appointments and injections for back Patient will continue low sodium diet Patient will take medications as per ordered Patient will continue to use pandemic precautions Rn will follow up within the month of November  Sidhant Helderman [place] Management [phone]

## 2020-03-04 DIAGNOSIS — H16223 Keratoconjunctivitis sicca, not specified as Sjogren's, bilateral: Secondary | ICD-10-CM | POA: Diagnosis not present

## 2020-03-04 DIAGNOSIS — H353122 Nonexudative age-related macular degeneration, left eye, intermediate dry stage: Secondary | ICD-10-CM | POA: Diagnosis not present

## 2020-03-04 DIAGNOSIS — H401133 Primary open-angle glaucoma, bilateral, severe stage: Secondary | ICD-10-CM | POA: Diagnosis not present

## 2020-03-04 DIAGNOSIS — H18221 Idiopathic corneal edema, right eye: Secondary | ICD-10-CM | POA: Diagnosis not present

## 2020-03-04 DIAGNOSIS — Z961 Presence of intraocular lens: Secondary | ICD-10-CM | POA: Diagnosis not present

## 2020-03-06 DIAGNOSIS — M47812 Spondylosis without myelopathy or radiculopathy, cervical region: Secondary | ICD-10-CM | POA: Diagnosis not present

## 2020-03-19 IMAGING — CT CT ABD-PELV W/ CM
2 of 5 series · 16 of 46 positions shown, 18 images · IV contrast (APPLIED)
Comparison: None.

CLINICAL DATA: [AGE] female with acute generalized abdominal
pain.

EXAM:
CT ABDOMEN AND PELVIS WITH CONTRAST
TECHNIQUE: Multidetector CT imaging of the abdomen and pelvis was performed
using the standard protocol following bolus administration of
intravenous contrast.
CONTRAST:  100mL F2HLG1-WVV IOPAMIDOL (F2HLG1-WVV) INJECTION 61%

[Series 3: abdomen 5.0 · axial · 0.94mm/px · z∈[+722,+1058]mm · 13 of 77 slices shown, 15 images]
[im 5/77  soft-tissue]
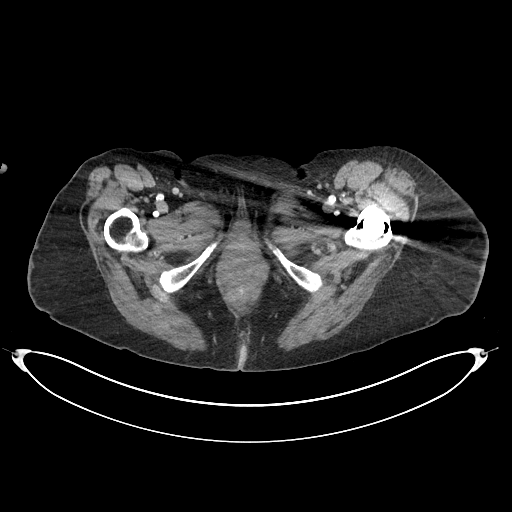
[im 5/77  bone]
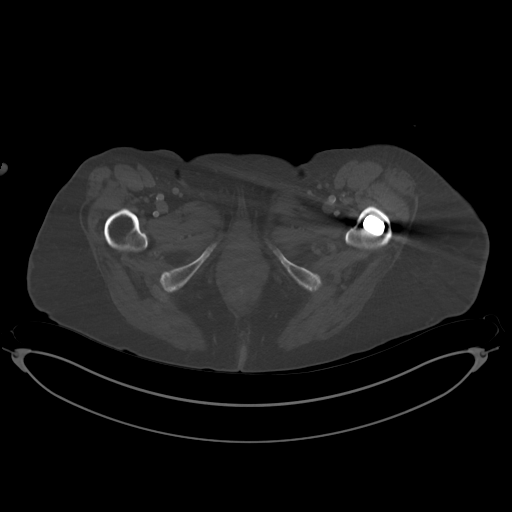
[im 10/77  soft-tissue]
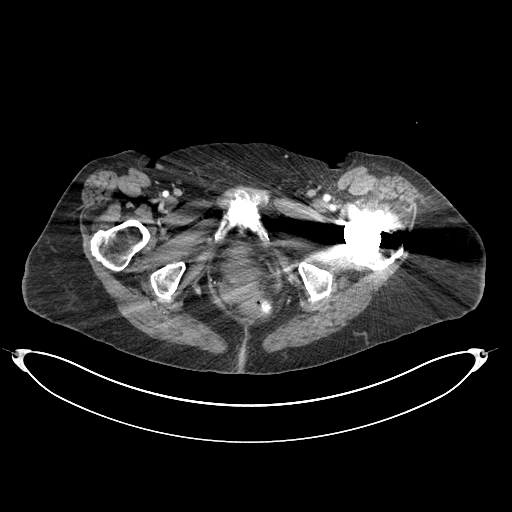
[im 15/77  soft-tissue]
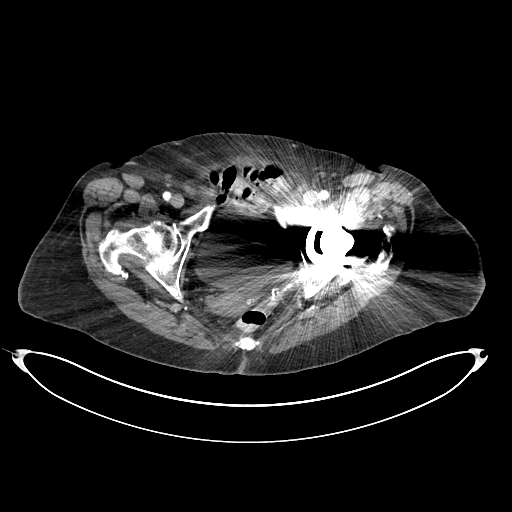
[im 24/77  soft-tissue]
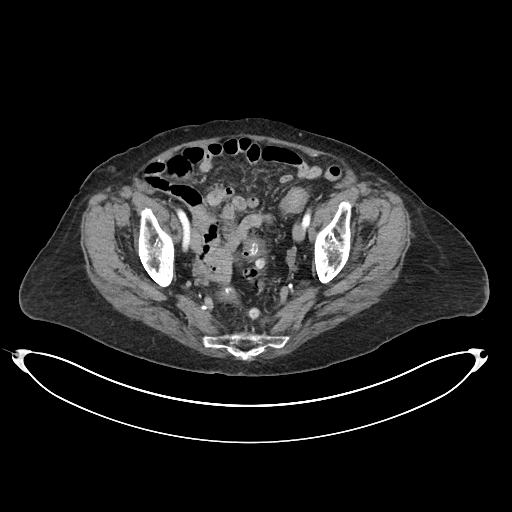
[im 29/77  soft-tissue]
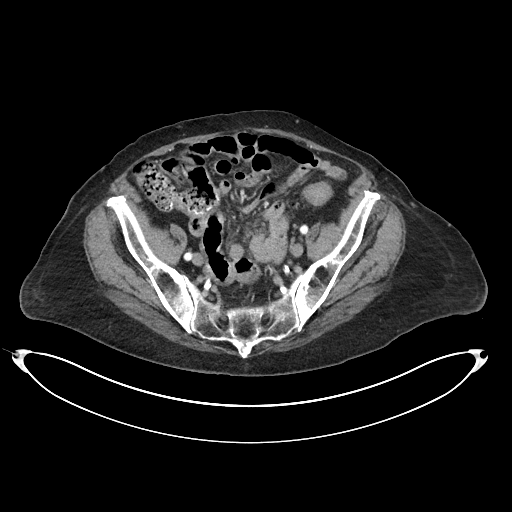
[im 34/77  soft-tissue]
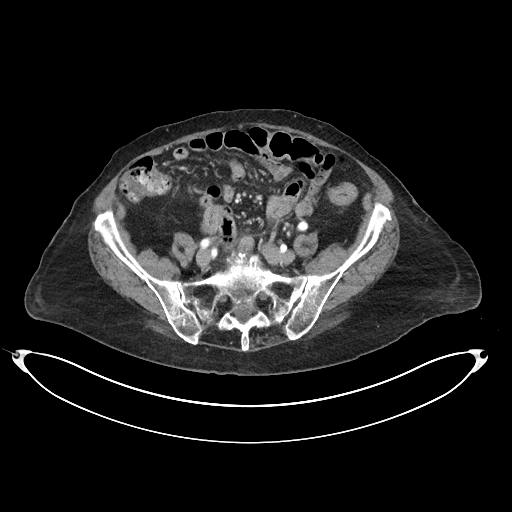
[im 39/77  soft-tissue]
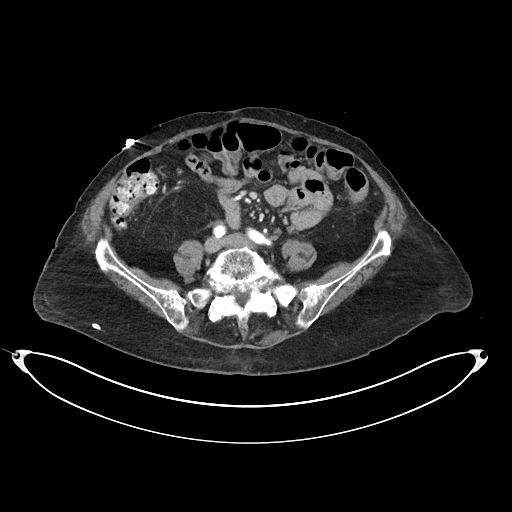
[im 43/77  soft-tissue]
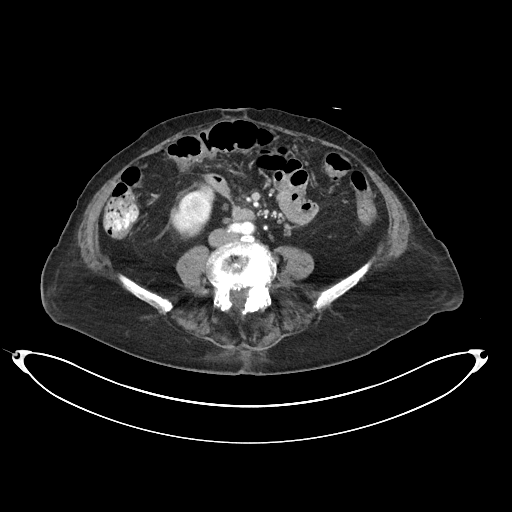
[im 48/77  soft-tissue]
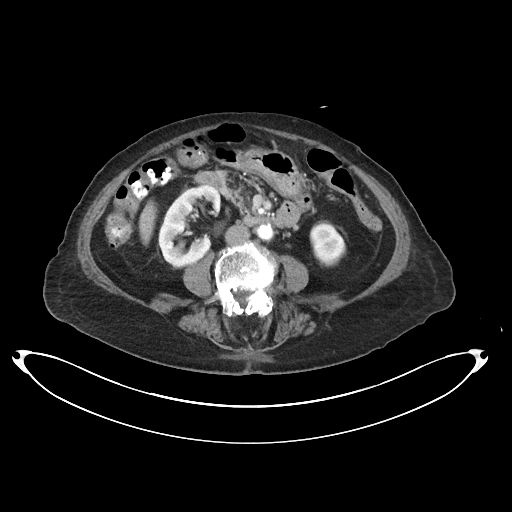
[im 48/77  bone]
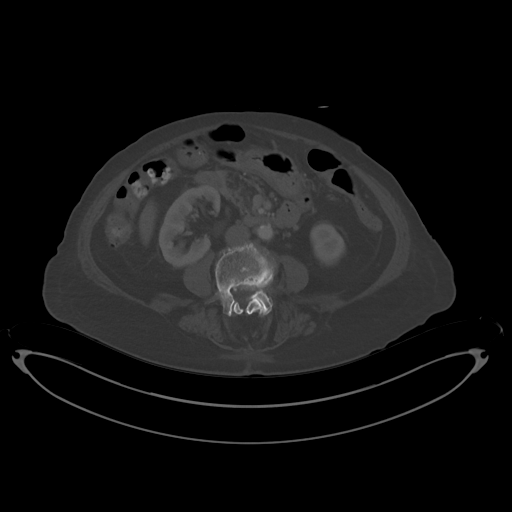
[im 53/77  soft-tissue]
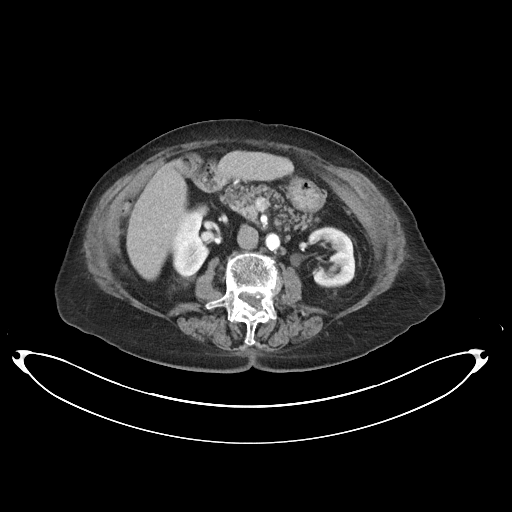
[im 62/77  soft-tissue]
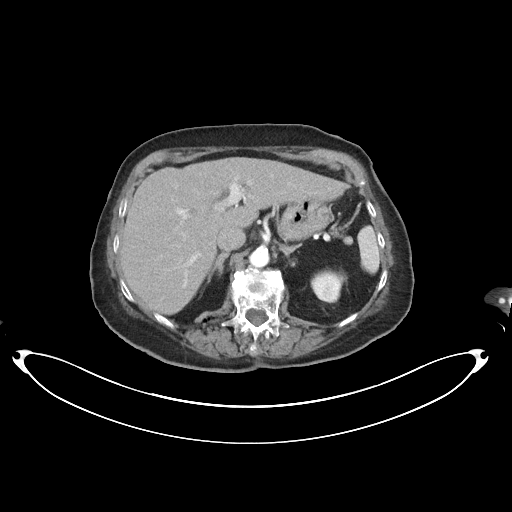
[im 67/77  soft-tissue]
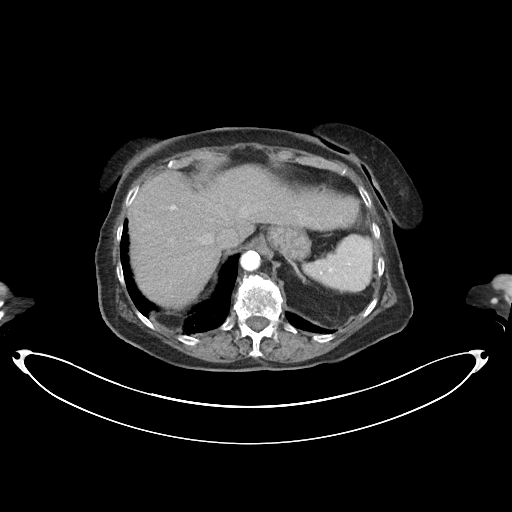
[im 72/77  soft-tissue]
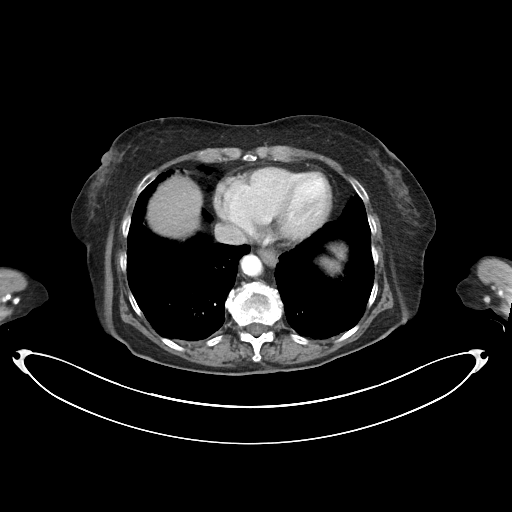

[Series 6: abdomen 3.0 mpr cor · coronal · 0.75mm/px · 3 of 95 slices shown]
[im 32/95  soft-tissue]
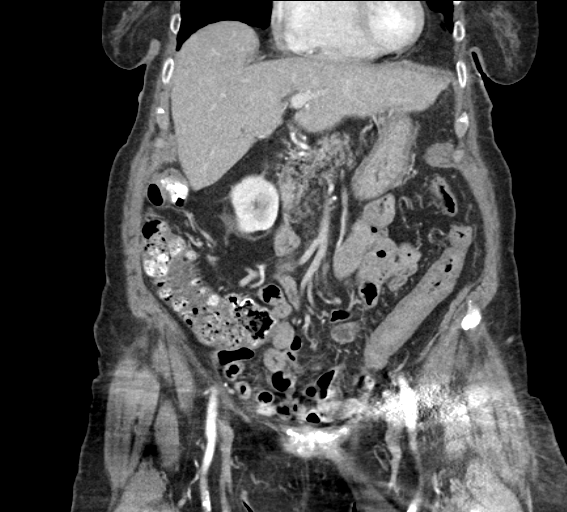
[im 42/95  soft-tissue]
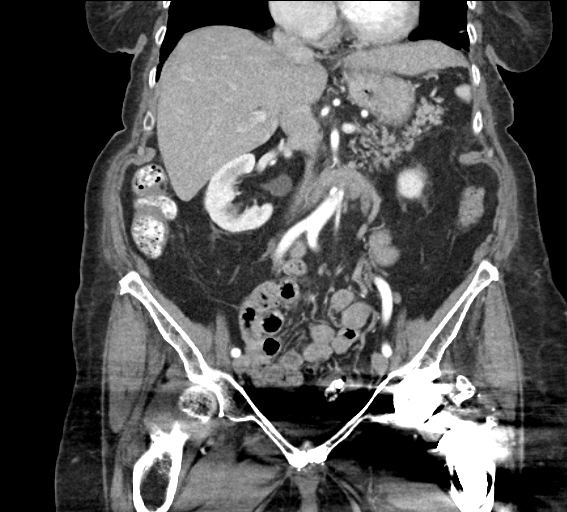
[im 53/95  soft-tissue]
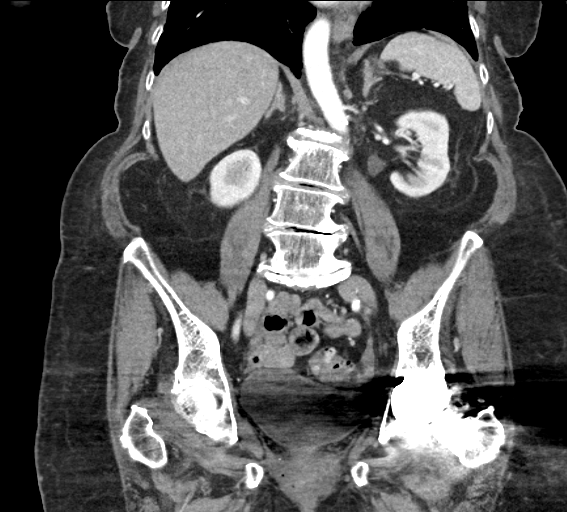

[16 of 46 positions shown; findings below may reference images not displayed]

FINDINGS: Lower chest: The visualized lung bases are clear.

No intra-abdominal free air or free fluid.

Hepatobiliary: Cholecystectomy.  The liver is unremarkable.

Pancreas: Unremarkable. No pancreatic ductal dilatation or
surrounding inflammatory changes.

Spleen: Normal in size without focal abnormality.

Adrenals/Urinary Tract: The adrenal glands are unremarkable. There
is no hydronephrosis on either side. The visualized ureters and
urinary bladder appear unremarkable.

Stomach/Bowel: There is extensive sigmoid diverticulosis with
muscular hypertrophy. Mild thickened appearance of the descending
colon, likely related to underdistention. Colitis is less likely.
Clinical correlation is recommended. There is no bowel obstruction.
Appendectomy.

Vascular/Lymphatic: Mild aortoiliac atherosclerotic disease. The
origins of the celiac axis, SMA, IMA remain patent. No portal venous
gas. There is no adenopathy.

Reproductive: The uterus is not visualized, likely atrophic or
surgically absent. No pelvic mass.

Other: None

Musculoskeletal: Total left hip arthroplasty. There is osteopenia
with scoliosis and degenerative changes of the spine. Multilevel
old-appearing compression deformities. No acute osseous pathology.
IMPRESSION: 1. Underdistention of the descending colon versus mild colitis.
Clinical correlation is recommended.
2. Sigmoid diverticulosis.  No bowel obstruction.
3.  Aortic Atherosclerosis ([id number]).

## 2020-04-26 DIAGNOSIS — Z23 Encounter for immunization: Secondary | ICD-10-CM | POA: Diagnosis not present

## 2020-05-01 DIAGNOSIS — Z23 Encounter for immunization: Secondary | ICD-10-CM | POA: Diagnosis not present

## 2020-05-21 ENCOUNTER — Encounter (INDEPENDENT_AMBULATORY_CARE_PROVIDER_SITE_OTHER): Payer: Medicare Other | Admitting: Ophthalmology

## 2020-05-21 ENCOUNTER — Other Ambulatory Visit: Payer: Self-pay

## 2020-05-21 DIAGNOSIS — H35032 Hypertensive retinopathy, left eye: Secondary | ICD-10-CM | POA: Diagnosis not present

## 2020-05-21 DIAGNOSIS — I10 Essential (primary) hypertension: Secondary | ICD-10-CM | POA: Diagnosis not present

## 2020-05-21 DIAGNOSIS — E113392 Type 2 diabetes mellitus with moderate nonproliferative diabetic retinopathy without macular edema, left eye: Secondary | ICD-10-CM

## 2020-05-21 DIAGNOSIS — E11319 Type 2 diabetes mellitus with unspecified diabetic retinopathy without macular edema: Secondary | ICD-10-CM | POA: Diagnosis not present

## 2020-05-26 DIAGNOSIS — I10 Essential (primary) hypertension: Secondary | ICD-10-CM | POA: Diagnosis not present

## 2020-05-26 DIAGNOSIS — E041 Nontoxic single thyroid nodule: Secondary | ICD-10-CM | POA: Diagnosis not present

## 2020-05-26 DIAGNOSIS — E559 Vitamin D deficiency, unspecified: Secondary | ICD-10-CM | POA: Diagnosis not present

## 2020-05-26 DIAGNOSIS — R82998 Other abnormal findings in urine: Secondary | ICD-10-CM | POA: Diagnosis not present

## 2020-05-26 DIAGNOSIS — E785 Hyperlipidemia, unspecified: Secondary | ICD-10-CM | POA: Diagnosis not present

## 2020-05-28 ENCOUNTER — Other Ambulatory Visit: Payer: Self-pay | Admitting: *Deleted

## 2020-05-28 NOTE — Patient Outreach (Addendum)
[place] [facility]) Care Management  05/28/2020  Marissa Ryan [birth year] [id number]   RN Health Coach attempted follow up outreach call to patient.  Patient was unavailable. No voicemail pick up. Plan: RN will call patient again within 30 days.  [facility] [phone]

## 2020-06-02 DIAGNOSIS — M4692 Unspecified inflammatory spondylopathy, cervical region: Secondary | ICD-10-CM | POA: Diagnosis not present

## 2020-06-02 DIAGNOSIS — Z1331 Encounter for screening for depression: Secondary | ICD-10-CM | POA: Diagnosis not present

## 2020-06-02 DIAGNOSIS — E1122 Type 2 diabetes mellitus with diabetic chronic kidney disease: Secondary | ICD-10-CM | POA: Diagnosis not present

## 2020-06-02 DIAGNOSIS — N1831 Chronic kidney disease, stage 3a: Secondary | ICD-10-CM | POA: Diagnosis not present

## 2020-06-02 DIAGNOSIS — M810 Age-related osteoporosis without current pathological fracture: Secondary | ICD-10-CM | POA: Diagnosis not present

## 2020-06-02 DIAGNOSIS — E1169 Type 2 diabetes mellitus with other specified complication: Secondary | ICD-10-CM | POA: Diagnosis not present

## 2020-06-02 DIAGNOSIS — E041 Nontoxic single thyroid nodule: Secondary | ICD-10-CM | POA: Diagnosis not present

## 2020-06-02 DIAGNOSIS — Z0000 Encounter for general adult medical examination without abnormal findings: Secondary | ICD-10-CM | POA: Diagnosis not present

## 2020-06-02 DIAGNOSIS — I129 Hypertensive chronic kidney disease with stage 1 through stage 4 chronic kidney disease, or unspecified chronic kidney disease: Secondary | ICD-10-CM | POA: Diagnosis not present

## 2020-06-02 DIAGNOSIS — I519 Heart disease, unspecified: Secondary | ICD-10-CM | POA: Diagnosis not present

## 2020-06-02 DIAGNOSIS — F329 Major depressive disorder, single episode, unspecified: Secondary | ICD-10-CM | POA: Diagnosis not present

## 2020-06-02 DIAGNOSIS — H547 Unspecified visual loss: Secondary | ICD-10-CM | POA: Diagnosis not present

## 2020-06-02 DIAGNOSIS — Z96642 Presence of left artificial hip joint: Secondary | ICD-10-CM | POA: Diagnosis not present

## 2020-06-20 ENCOUNTER — Inpatient Hospital Stay (HOSPITAL_COMMUNITY)
Admission: EM | Admit: 2020-06-20 | Discharge: 2020-06-24 | DRG: 535 | Disposition: A | Discharge status: Skilled Nursing Facility | Payer: Medicare Other | Attending: Internal Medicine | Admitting: Internal Medicine

## 2020-06-20 ENCOUNTER — Emergency Department (HOSPITAL_COMMUNITY): Payer: Medicare Other

## 2020-06-20 ENCOUNTER — Other Ambulatory Visit: Payer: Self-pay

## 2020-06-20 DIAGNOSIS — M542 Cervicalgia: Secondary | ICD-10-CM | POA: Diagnosis present

## 2020-06-20 DIAGNOSIS — S32591A Other specified fracture of right pubis, initial encounter for closed fracture: Secondary | ICD-10-CM | POA: Diagnosis present

## 2020-06-20 DIAGNOSIS — M25551 Pain in right hip: Secondary | ICD-10-CM | POA: Diagnosis not present

## 2020-06-20 DIAGNOSIS — G928 Other toxic encephalopathy: Secondary | ICD-10-CM | POA: Diagnosis not present

## 2020-06-20 DIAGNOSIS — W19XXXA Unspecified fall, initial encounter: Secondary | ICD-10-CM

## 2020-06-20 DIAGNOSIS — Z20822 Contact with and (suspected) exposure to covid-19: Secondary | ICD-10-CM | POA: Diagnosis present

## 2020-06-20 DIAGNOSIS — S0990XA Unspecified injury of head, initial encounter: Secondary | ICD-10-CM | POA: Diagnosis not present

## 2020-06-20 DIAGNOSIS — S32301A Unspecified fracture of right ilium, initial encounter for closed fracture: Secondary | ICD-10-CM | POA: Diagnosis not present

## 2020-06-20 DIAGNOSIS — F419 Anxiety disorder, unspecified: Secondary | ICD-10-CM | POA: Diagnosis present

## 2020-06-20 DIAGNOSIS — Z8249 Family history of ischemic heart disease and other diseases of the circulatory system: Secondary | ICD-10-CM

## 2020-06-20 DIAGNOSIS — E785 Hyperlipidemia, unspecified: Secondary | ICD-10-CM | POA: Diagnosis present

## 2020-06-20 DIAGNOSIS — Z885 Allergy status to narcotic agent status: Secondary | ICD-10-CM

## 2020-06-20 DIAGNOSIS — E119 Type 2 diabetes mellitus without complications: Secondary | ICD-10-CM | POA: Diagnosis present

## 2020-06-20 DIAGNOSIS — J984 Other disorders of lung: Secondary | ICD-10-CM | POA: Diagnosis not present

## 2020-06-20 DIAGNOSIS — R0902 Hypoxemia: Secondary | ICD-10-CM | POA: Diagnosis not present

## 2020-06-20 DIAGNOSIS — W2209XA Striking against other stationary object, initial encounter: Secondary | ICD-10-CM | POA: Diagnosis present

## 2020-06-20 DIAGNOSIS — Z79890 Hormone replacement therapy (postmenopausal): Secondary | ICD-10-CM

## 2020-06-20 DIAGNOSIS — Y9301 Activity, walking, marching and hiking: Secondary | ICD-10-CM | POA: Diagnosis present

## 2020-06-20 DIAGNOSIS — S32810A Multiple fractures of pelvis with stable disruption of pelvic ring, initial encounter for closed fracture: Secondary | ICD-10-CM

## 2020-06-20 DIAGNOSIS — Z7982 Long term (current) use of aspirin: Secondary | ICD-10-CM

## 2020-06-20 DIAGNOSIS — Z888 Allergy status to other drugs, medicaments and biological substances status: Secondary | ICD-10-CM

## 2020-06-20 DIAGNOSIS — I10 Essential (primary) hypertension: Secondary | ICD-10-CM | POA: Diagnosis present

## 2020-06-20 DIAGNOSIS — Z79899 Other long term (current) drug therapy: Secondary | ICD-10-CM

## 2020-06-20 DIAGNOSIS — Z96642 Presence of left artificial hip joint: Secondary | ICD-10-CM | POA: Diagnosis present

## 2020-06-20 DIAGNOSIS — Z823 Family history of stroke: Secondary | ICD-10-CM

## 2020-06-20 DIAGNOSIS — Z880 Allergy status to penicillin: Secondary | ICD-10-CM

## 2020-06-20 DIAGNOSIS — N390 Urinary tract infection, site not specified: Secondary | ICD-10-CM | POA: Diagnosis not present

## 2020-06-20 DIAGNOSIS — R52 Pain, unspecified: Secondary | ICD-10-CM | POA: Diagnosis not present

## 2020-06-20 DIAGNOSIS — S32501A Unspecified fracture of right pubis, initial encounter for closed fracture: Secondary | ICD-10-CM | POA: Diagnosis not present

## 2020-06-20 DIAGNOSIS — S32592A Other specified fracture of left pubis, initial encounter for closed fracture: Principal | ICD-10-CM | POA: Diagnosis present

## 2020-06-20 DIAGNOSIS — Z66 Do not resuscitate: Secondary | ICD-10-CM | POA: Diagnosis present

## 2020-06-20 DIAGNOSIS — Y92239 Unspecified place in hospital as the place of occurrence of the external cause: Secondary | ICD-10-CM | POA: Diagnosis not present

## 2020-06-20 DIAGNOSIS — S199XXA Unspecified injury of neck, initial encounter: Secondary | ICD-10-CM | POA: Diagnosis not present

## 2020-06-20 DIAGNOSIS — Z7984 Long term (current) use of oral hypoglycemic drugs: Secondary | ICD-10-CM

## 2020-06-20 DIAGNOSIS — Z9841 Cataract extraction status, right eye: Secondary | ICD-10-CM

## 2020-06-20 DIAGNOSIS — S32511A Fracture of superior rim of right pubis, initial encounter for closed fracture: Secondary | ICD-10-CM | POA: Diagnosis not present

## 2020-06-20 DIAGNOSIS — Z9842 Cataract extraction status, left eye: Secondary | ICD-10-CM

## 2020-06-20 DIAGNOSIS — Z96611 Presence of right artificial shoulder joint: Secondary | ICD-10-CM | POA: Diagnosis present

## 2020-06-20 DIAGNOSIS — H9190 Unspecified hearing loss, unspecified ear: Secondary | ICD-10-CM | POA: Diagnosis present

## 2020-06-20 DIAGNOSIS — Z8041 Family history of malignant neoplasm of ovary: Secondary | ICD-10-CM

## 2020-06-20 DIAGNOSIS — T402X5A Adverse effect of other opioids, initial encounter: Secondary | ICD-10-CM | POA: Diagnosis not present

## 2020-06-20 DIAGNOSIS — G479 Sleep disorder, unspecified: Secondary | ICD-10-CM | POA: Diagnosis present

## 2020-06-20 DIAGNOSIS — M4312 Spondylolisthesis, cervical region: Secondary | ICD-10-CM | POA: Diagnosis not present

## 2020-06-20 DIAGNOSIS — H409 Unspecified glaucoma: Secondary | ICD-10-CM | POA: Diagnosis present

## 2020-06-20 DIAGNOSIS — Y92009 Unspecified place in unspecified non-institutional (private) residence as the place of occurrence of the external cause: Secondary | ICD-10-CM

## 2020-06-20 DIAGNOSIS — Z85828 Personal history of other malignant neoplasm of skin: Secondary | ICD-10-CM

## 2020-06-20 DIAGNOSIS — S32391A Other fracture of right ilium, initial encounter for closed fracture: Secondary | ICD-10-CM | POA: Diagnosis not present

## 2020-06-20 DIAGNOSIS — M47812 Spondylosis without myelopathy or radiculopathy, cervical region: Secondary | ICD-10-CM | POA: Diagnosis not present

## 2020-06-20 DIAGNOSIS — S79911A Unspecified injury of right hip, initial encounter: Secondary | ICD-10-CM | POA: Diagnosis not present

## 2020-06-20 DIAGNOSIS — F0394 Unspecified dementia, unspecified severity, with anxiety: Secondary | ICD-10-CM | POA: Diagnosis present

## 2020-06-20 DIAGNOSIS — Z9049 Acquired absence of other specified parts of digestive tract: Secondary | ICD-10-CM

## 2020-06-20 LAB — COMPREHENSIVE METABOLIC PANEL
ALT: 17 U/L (ref 0–44)
AST: 26 U/L (ref 15–41)
Albumin: 3.6 g/dL (ref 3.5–5.0)
Alkaline Phosphatase: 43 U/L (ref 38–126)
Anion gap: 9 (ref 5–15)
BUN: 26 mg/dL — ABNORMAL HIGH (ref 8–23)
CO2: 25 mmol/L (ref 22–32)
Calcium: 9.3 mg/dL (ref 8.9–10.3)
Chloride: 101 mmol/L (ref 98–111)
Creatinine, Ser: 0.88 mg/dL (ref 0.44–1.00)
GFR, Estimated: >60 mL/min (ref >60)
Glucose, Bld: 198 mg/dL — ABNORMAL HIGH (ref 70–99)
Potassium: 4.6 mmol/L (ref 3.5–5.1)
Sodium: 135 mmol/L (ref 135–145)
Total Bilirubin: 0.4 mg/dL (ref 0.3–1.2)
Total Protein: 6.4 g/dL — ABNORMAL LOW (ref 6.5–8.1)

## 2020-06-20 LAB — HEMOGLOBIN A1C
Hgb A1c MFr Bld: 6.6 % — ABNORMAL HIGH (ref 4.8–5.6)
Mean Plasma Glucose: 142.72 mg/dL

## 2020-06-20 LAB — CBC WITH DIFFERENTIAL/PLATELET
Abs Immature Granulocytes: 0.21 10*3/uL — ABNORMAL HIGH (ref 0.00–0.07)
Basophils Absolute: 0 10*3/uL (ref 0.0–0.1)
Basophils Relative: 0 %
Eosinophils Absolute: 0.1 10*3/uL (ref 0.0–0.5)
Eosinophils Relative: 1 %
HCT: 36.1 % (ref 36.0–46.0)
Hemoglobin: 11.7 g/dL — ABNORMAL LOW (ref 12.0–15.0)
Immature Granulocytes: 1 %
Lymphocytes Relative: 9 %
Lymphs Abs: 1.4 10*3/uL (ref 0.7–4.0)
MCH: 31.4 pg (ref 26.0–34.0)
MCHC: 32.4 g/dL (ref 30.0–36.0)
MCV: 96.8 fL (ref 80.0–100.0)
Monocytes Absolute: 0.9 10*3/uL (ref 0.1–1.0)
Monocytes Relative: 6 %
Neutro Abs: 12.4 10*3/uL — ABNORMAL HIGH (ref 1.7–7.7)
Neutrophils Relative %: 83 %
Platelets: 283 10*3/uL (ref 150–400)
RBC: 3.73 MIL/uL — ABNORMAL LOW (ref 3.87–5.11)
RDW: 13.2 % (ref 11.5–15.5)
WBC: 15 10*3/uL — ABNORMAL HIGH (ref 4.0–10.5)
nRBC: 0 % (ref 0.0–0.2)

## 2020-06-20 LAB — CBG MONITORING, ED: Glucose-Capillary: 97 mg/dL (ref 70–99)

## 2020-06-20 LAB — RESP PANEL BY RT-PCR (FLU A&B, COVID) ARPGX2
Influenza A by PCR: NEGATIVE
Influenza B by PCR: NEGATIVE
SARS Coronavirus 2 by RT PCR: NEGATIVE

## 2020-06-20 MED ORDER — DIPHENHYDRAMINE HCL 25 MG PO CAPS: 25.0000 mg | ORAL_CAPSULE | Freq: Four times a day (QID) | ORAL | Status: DC | PRN

## 2020-06-20 MED ORDER — HYDROMORPHONE HCL 1 MG/ML IJ SOLN
0.5000 mg | Freq: Once | INTRAMUSCULAR | Status: AC
  Administered 2020-06-20: 0.5 mg via INTRAVENOUS
  Filled 2020-06-20: qty 1

## 2020-06-20 MED ORDER — ENOXAPARIN SODIUM 40 MG/0.4ML ~~LOC~~ SOLN
40.0000 mg | SUBCUTANEOUS | Status: DC
  Administered 2020-06-20 – 2020-06-23 (×4): 40 mg via SUBCUTANEOUS
  Filled 2020-06-20 (×4): qty 0.4

## 2020-06-20 MED ORDER — HYDROMORPHONE HCL 1 MG/ML IJ SOLN: 0.5000 mg | INTRAMUSCULAR | Status: DC | PRN

## 2020-06-20 MED ORDER — MIRTAZAPINE 15 MG PO TABS
15.0000 mg | ORAL_TABLET | Freq: Every day | ORAL | Status: DC
  Administered 2020-06-20 – 2020-06-23 (×4): 15 mg via ORAL
  Filled 2020-06-20 (×4): qty 1

## 2020-06-20 MED ORDER — ONDANSETRON HCL 4 MG/2ML IJ SOLN
4.0000 mg | Freq: Once | INTRAMUSCULAR | Status: AC
  Administered 2020-06-20: 4 mg via INTRAVENOUS
  Filled 2020-06-20: qty 2

## 2020-06-20 MED ORDER — POLYVINYL ALCOHOL 1.4 % OP SOLN
1.0000 [drp] | Freq: Three times a day (TID) | OPHTHALMIC | Status: DC | PRN
  Administered 2020-06-21: 1 [drp] via OPHTHALMIC
  Filled 2020-06-20: qty 15

## 2020-06-20 MED ORDER — LOSARTAN POTASSIUM 50 MG PO TABS
100.0000 mg | ORAL_TABLET | Freq: Every day | ORAL | Status: DC
  Administered 2020-06-20 – 2020-06-23 (×4): 100 mg via ORAL
  Filled 2020-06-20 (×4): qty 2

## 2020-06-20 MED ORDER — MELATONIN 3 MG PO TABS
3.0000 mg | ORAL_TABLET | Freq: Every day | ORAL | Status: DC
  Administered 2020-06-20 – 2020-06-23 (×4): 3 mg via ORAL
  Filled 2020-06-20 (×4): qty 1

## 2020-06-20 MED ORDER — SODIUM CHLORIDE (HYPERTONIC) 5 % OP SOLN: 1.0000 [drp] | Freq: Every day | OPHTHALMIC | Status: DC

## 2020-06-20 MED ORDER — SERTRALINE HCL 50 MG PO TABS
50.0000 mg | ORAL_TABLET | Freq: Every day | ORAL | Status: DC
  Administered 2020-06-20 – 2020-06-23 (×4): 50 mg via ORAL
  Filled 2020-06-20 (×4): qty 1

## 2020-06-20 MED ORDER — INSULIN ASPART 100 UNIT/ML ~~LOC~~ SOLN
0.0000 [IU] | Freq: Three times a day (TID) | SUBCUTANEOUS | Status: DC
  Administered 2020-06-21: 3 [IU] via SUBCUTANEOUS
  Administered 2020-06-21 (×2): 1 [IU] via SUBCUTANEOUS
  Administered 2020-06-22: 2 [IU] via SUBCUTANEOUS
  Administered 2020-06-23 (×2): 1 [IU] via SUBCUTANEOUS
  Administered 2020-06-23 – 2020-06-24 (×2): 2 [IU] via SUBCUTANEOUS

## 2020-06-20 MED ORDER — OXYCODONE-ACETAMINOPHEN 5-325 MG PO TABS
1.0000 | ORAL_TABLET | Freq: Four times a day (QID) | ORAL | Status: DC | PRN
  Administered 2020-06-20: 1 via ORAL
  Filled 2020-06-20: qty 1

## 2020-06-20 MED ORDER — PROSIGHT PO TABS
1.0000 | ORAL_TABLET | Freq: Every day | ORAL | Status: DC
  Administered 2020-06-21 – 2020-06-24 (×4): 1 via ORAL
  Filled 2020-06-20 (×5): qty 1

## 2020-06-20 MED ORDER — GABAPENTIN 300 MG PO CAPS
300.0000 mg | ORAL_CAPSULE | Freq: Two times a day (BID) | ORAL | Status: DC
  Administered 2020-06-20 – 2020-06-24 (×7): 300 mg via ORAL
  Filled 2020-06-20 (×3): qty 1
  Filled 2020-06-20: qty 3
  Filled 2020-06-20 (×3): qty 1

## 2020-06-20 MED ORDER — POLYETHYLENE GLYCOL 3350 17 G PO PACK: 17.0000 g | PACK | Freq: Every day | ORAL | Status: DC | PRN

## 2020-06-20 NOTE — Consult Note (Signed)
Reason for Consult:right hip pain Referring Physician: EDP  Marissa Ryan is an 86 y.o. female.  HPI: 86 yo female who reports a mechanical fall tripping over a carpet this evening. She complained of immediate right hip pain. She was unable to bear weight on the right leg. Patient requested [facility] evaluation as Dr Maureen Ralphs is her surgeon for left total hip. She denies other injury.  Past Medical History:  Diagnosis Date  . Anxiety   . Arthritis   . Cancer ([place])    hx of skin cancer removal on hand   . Colitis   . Diabetes mellitus without complication ([place])   . Gallstones   . Hyperlipidemia   . Hypertension   . IBS (irritable bowel syndrome)   . Osteoporosis     Past Surgical History:  Procedure Laterality Date  . APPENDECTOMY    . BACK SURGERY     x2  . cataract surgery      bilateral   . CHOLECYSTECTOMY  1999  . EYE SURGERY     for glaucoma   . left eye peeling surgery     . left femur bone surgery      1978  . REVERSE SHOULDER ARTHROPLASTY Right 10/10/2014   Procedure: RIGHT REVERSE SHOULDER ARTHROPLASTY;  Surgeon: Justice Britain, MD;  Location: [facility];  Service: Orthopedics;  Laterality: Right;  . ROTATOR CUFF REPAIR    . TOTAL HIP ARTHROPLASTY Left 02/20/2014   Procedure: LEFT TOTAL HIP ARTHROPLASTY ANTERIOR APPROACH;  Surgeon: Gearlean Alf, MD;  Location: [facility];  Service: Orthopedics;  Laterality: Left;  . WRIST SURGERY     left     Family History  Problem Relation Age of Onset  . Stroke Mother   . Hypertension Mother   . Ovarian cancer Sister     Social History:  reports that she has never smoked. She has never used smokeless tobacco. She reports current alcohol use. She reports that she does not use drugs.  Allergies:  Allergies  Allergen Reactions  . Robaxin [Methocarbamol] Rash and Other (See Comments)    Red rash on right side of face and forehead cleared after benadryl given, per [name], RN  . Ancef [Cefazolin] Diarrhea  . Morphine And Related  Itching  . Penicillins Itching    Tolerated Unasyn Has patient had a PCN reaction causing immediate rash, facial/tongue/throat swelling, SOB or lightheadedness with hypotension: Yes Has patient had a PCN reaction causing severe rash involving mucus membranes or skin necrosis: No Has patient had a PCN reaction that required hospitalization: Was in hospital Has patient had a PCN reaction occurring within the last 10 years: No If all of the above answers are "NO", then may proceed with Cephalosporin use.   . Tape Other (See Comments)    Bandages and leads cause bruising and weeping at sites  . Flurbiprofen Diarrhea    Medications: I have reviewed the patient's current medications.  Results for orders placed or performed during the hospital encounter of 06/20/20 (from the past 48 hour(s))  Comprehensive metabolic panel     Status: Abnormal   Collection Time: 06/20/20  4:42 PM  Result Value Ref Range   Sodium 135 135 - 145 mmol/L   Potassium 4.6 3.5 - 5.1 mmol/L   Chloride 101 98 - 111 mmol/L   CO2 25 22 - 32 mmol/L   Glucose, Bld 198 (H) 70 - 99 mg/dL    Comment: Glucose reference range applies only to samples taken after  fasting for at least 8 hours.   BUN 26 (H) 8 - 23 mg/dL   Creatinine, Ser 0.88 0.44 - 1.00 mg/dL   Calcium 9.3 8.9 - 10.3 mg/dL   Total Protein 6.4 (L) 6.5 - 8.1 g/dL   Albumin 3.6 3.5 - 5.0 g/dL   AST 26 15 - 41 U/L   ALT 17 0 - 44 U/L   Alkaline Phosphatase 43 38 - 126 U/L   Total Bilirubin 0.4 0.3 - 1.2 mg/dL   GFR, Estimated >60 >60 mL/min    Comment: (NOTE) Calculated using the CKD-EPI Creatinine Equation (2021)    Anion gap 9 5 - 15    Comment: Performed at [facility] [address], [place], [place] [address]  CBC with Differential     Status: Abnormal   Collection Time: 06/20/20  4:42 PM  Result Value Ref Range   WBC 15.0 (H) 4.0 - 10.5 K/uL   RBC 3.73 (L) 3.87 - 5.11 MIL/uL   Hemoglobin 11.7 (L) 12.0 - 15.0 g/dL   HCT 36.1 36 - 46 %    MCV 96.8 80.0 - 100.0 fL   MCH 31.4 26.0 - 34.0 pg   MCHC 32.4 30.0 - 36.0 g/dL   RDW 13.2 11.5 - 15.5 %   Platelets 283 150 - 400 K/uL   nRBC 0.0 0.0 - 0.2 %   Neutrophils Relative % 83 %   Neutro Abs 12.4 (H) 1.7 - 7.7 K/uL   Lymphocytes Relative 9 %   Lymphs Abs 1.4 0.7 - 4.0 K/uL   Monocytes Relative 6 %   Monocytes Absolute 0.9 0.1 - 1.0 K/uL   Eosinophils Relative 1 %   Eosinophils Absolute 0.1 0.0 - 0.5 K/uL   Basophils Relative 0 %   Basophils Absolute 0.0 0.0 - 0.1 K/uL   Immature Granulocytes 1 %   Abs Immature Granulocytes 0.21 (H) 0.00 - 0.07 K/uL    Comment: Performed at [facility] [address], [place], [place] [address]  Resp Panel by RT-PCR (Flu A&B, Covid) Nasopharyngeal Swab     Status: None   Collection Time: 06/20/20  4:42 PM   Specimen: Nasopharyngeal Swab; Nasopharyngeal(NP) swabs in vial transport medium  Result Value Ref Range   SARS Coronavirus 2 by RT PCR NEGATIVE NEGATIVE    Comment: (NOTE) SARS-CoV-2 target nucleic acids are NOT DETECTED.  The SARS-CoV-2 RNA is generally detectable in upper respiratory specimens during the acute phase of infection. The lowest concentration of SARS-CoV-2 viral copies this assay can detect is 138 copies/mL. A negative result does not preclude SARS-Cov-2 infection and should not be used as the sole basis for treatment or other patient management decisions. A negative result may occur with  improper specimen collection/handling, submission of specimen other than nasopharyngeal swab, presence of viral mutation(s) within the areas targeted by this assay, and inadequate number of viral copies(<138 copies/mL). A negative result must be combined with clinical observations, patient history, and epidemiological information. The expected result is Negative.  Fact Sheet for Patients:  EntrepreneurPulse.com.au  Fact Sheet for Healthcare Providers:  IncredibleEmployment.be  This  test is no t yet approved or cleared by the [place] FDA and  has been authorized for detection and/or diagnosis of SARS-CoV-2 by FDA under an Emergency Use Authorization (EUA). This EUA will remain  in effect (meaning this test can be used) for the duration of the COVID-19 declaration under Section 564(b)(1) of the Act, 21 U.S.C.section 360bbb-3(b)(1), unless the authorization  is terminated  or revoked sooner.       Influenza A by PCR NEGATIVE NEGATIVE   Influenza B by PCR NEGATIVE NEGATIVE    Comment: (NOTE) The Xpert Xpress SARS-CoV-2/FLU/RSV plus assay is intended as an aid in the diagnosis of influenza from Nasopharyngeal swab specimens and should not be used as a sole basis for treatment. Nasal washings and aspirates are unacceptable for Xpert Xpress SARS-CoV-2/FLU/RSV testing.  Fact Sheet for Patients: EntrepreneurPulse.com.au  Fact Sheet for Healthcare Providers: IncredibleEmployment.be  This test is not yet approved or cleared by the [place] FDA and has been authorized for detection and/or diagnosis of SARS-CoV-2 by FDA under an Emergency Use Authorization (EUA). This EUA will remain in effect (meaning this test can be used) for the duration of the COVID-19 declaration under Section 564(b)(1) of the Act, 21 U.S.C. section 360bbb-3(b)(1), unless the authorization is terminated or revoked.  Performed at [facility], [place] [address] [place], [place] [address]     CT Head Wo Contrast  Result Date: 06/20/2020 CLINICAL DATA:  Tripped and fell EXAM: CT HEAD WITHOUT CONTRAST TECHNIQUE: Contiguous axial images were obtained from the base of the skull through the vertex without intravenous contrast. COMPARISON:  01/29/2020 FINDINGS: Brain: No acute infarct or hemorrhage. Lateral ventricles and midline structures are stable. No acute extra-axial fluid collections. No mass effect. Vascular: No hyperdense vessel or unexpected  calcification. Skull: Normal. Negative for fracture or focal lesion. Sinuses/Orbits: No acute finding. Other: None. IMPRESSION: 1. Stable head CT, no acute process. Electronically Signed   By: Randa Ngo M.D.   On: 06/20/2020 16:59   CT Cervical Spine Wo Contrast  Result Date: 06/20/2020 CLINICAL DATA:  Tripped and fell EXAM: CT CERVICAL SPINE WITHOUT CONTRAST TECHNIQUE: Multidetector CT imaging of the cervical spine was performed without intravenous contrast. Multiplanar CT image reconstructions were also generated. COMPARISON:  01/29/2020 FINDINGS: Alignment: Alignment is stable, with minimal anterolisthesis of C3 on C4 and C4 on C5. This is likely due to extensive facet hypertrophic changes. Skull base and vertebrae: No acute fracture. No primary bone lesion or focal pathologic process. Chronic mild compression deformities of T1 and T2 unchanged. Soft tissues and spinal canal: No prevertebral fluid or swelling. No visible canal hematoma. Disc levels: Marked degenerative changes at C1-C2 with significant pannus, stable. Stable multilevel spondylosis greatest at C5-6 and C6-7. Diffuse facet hypertrophy unchanged. Upper chest: Airway is patent. Biapical pleural and parenchymal scarring unchanged. Other: Reconstructed images demonstrate no additional findings. IMPRESSION: 1. No acute cervical spine fracture. 2. Stable multilevel degenerative changes. Electronically Signed   By: Randa Ngo M.D.   On: 06/20/2020 17:04   CT PELVIS WO CONTRAST  Result Date: 06/20/2020 CLINICAL DATA:  Pelvic fractures. EXAM: CT PELVIS WITHOUT CONTRAST TECHNIQUE: Multidetector CT imaging of the pelvis was performed following the standard protocol without intravenous contrast. COMPARISON:  Hip x-ray June 20, 2020. CT of the abdomen and pelvis June 20, 2018 FINDINGS: Urinary Tract: The bladder is mildly distended but otherwise normal. Distal ureters are unremarkable. The lower pole the right kidney is unremarkable.  Bowel: Colonic diverticulosis is seen without diverticulitis. The bowel is otherwise unremarkable. Vascular/Lymphatic: Calcified atherosclerosis is seen in the distal abdominal aorta, iliac vessels, and femoral vessels. Reproductive:  No mass or other significant abnormality Other:  None. Musculoskeletal: There is a mild displaced fracture of the right inferior pubic ramus. There is a displaced comminuted fracture of the right superior pubic ramus. No extension into the acetabulum identified. There  is a fracture through the posterior right iliac bone best seen on coronal image 90 and 91. This fracture is also seen on axial images 14 and 15. There appears to be subtle extension into the right SI joint as seen on coronal image 90. No widening of the SI joint identified. The fracture lines also extend into the base of the right iliac crest as seen on sagittal image 55 and coronal image 70. IMPRESSION: 1. Mildly displaced comminuted right inferior pubic ramus fracture. 2. Displaced comminuted fracture of the right superior pubic ramus. 3. Fractures in the right iliac bone. Posterior fracture lines appear to extend into the SI joint although this is subtle and there is no widening of the SI joint. The fractures also extend into the base of the right iliac crest as above. 4. There may be a subtle fracture through the superior most aspect of the left pubic ramus is seen on axial image 41. Electronically Signed   By: Dorise Bullion III M.D   On: 06/20/2020 17:58   DG Hip Unilat With Pelvis 2-3 Views Right  Result Date: 06/20/2020 CLINICAL DATA:  86 year old female with fall. Right hip pain. EXAM: DG HIP (WITH OR WITHOUT PELVIS) 2-3V RIGHT COMPARISON:  CT abdomen pelvis dated 07/17/2018. 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 The arthroplasty components appear intact and in anatomic alignment. There is a minimally displaced fracture of the right superior and inferior pubic rami. No other acute fracture. There  is no dislocation. The bones are osteopenic. Vascular calcifications noted. The soft tissues are otherwise unremarkable. IMPRESSION: Minimally displaced fractures of the right superior and inferior pubic rami. Electronically Signed   By: Anner Crete M.D.   On: 06/20/2020 16:36    Review of Systems Blood pressure (!) 145/87, pulse 71, temperature 98.5 F (36.9 C), temperature source Oral, resp. rate 18, SpO2 98 %. Physical Exam Awake and alert, moderate distress. Neck non tender with normal AROM. T and L spine are non tender. She is tender over her right SI joint.  Moderate pain with logroll of the right hip. Alignment of the right leg looks normal and leg lengths are equal.  Bilateral UEs with 5/5 motor and normal AROM.  Left LE with pain free PROM of the knee and hip and ankle.  Assessment/Plan: S/p mechanical fall with multiple stable pelvic fractures including bilateral pubic rami fractures and a right ileum fracture which extends into the SI joint. The joint itself is not wide or translated.  Recommend 50% WB on the right LE and WBAT on the left LE.  Mobilization with therapy. Possibly will need SNF placement depending on how she does with therapy DVT prophylaxis Will follow.  Augustin Schooling 06/20/2020, 9:20 PM

## 2020-06-20 NOTE — ED Notes (Signed)
Handoff given to night RN

## 2020-06-20 NOTE — ED Provider Notes (Signed)
[facility]PART[facility] Provider Note   CSN: [id number] Arrival date & time: 06/20/20  1523     History Chief Complaint  Patient presents with  . Fall    Marissa Ryan is a 86 y.o. female.  86 year female brought in by EMS from home for right hip pain after a fall. Patient states she was walking when she tripped on a rug and fell, injuring her right hip. Patient reports hitting her head on a cabinet but denies LOC, denies taking blood thinners. Patient was able to stand after the fall but continued to complain of hip pain which prompted family to call 911. Patient has a history of osteoporosis, DM, HTN, hyperlipidemia. Last ate breakfast at 9AM and had an Ensure drink just prior to the fall. No other injuries or complaints.         Past Medical History:  Diagnosis Date  . Anxiety   . Arthritis   . Cancer ([place])    hx of skin cancer removal on hand   . Colitis   . Diabetes mellitus without complication ([place])   . Gallstones   . Hyperlipidemia   . Hypertension   . IBS (irritable bowel syndrome)   . Osteoporosis     Patient Active Problem List   Diagnosis Date Noted  . Fracture of multiple pubic rami, right, closed, initial encounter ([place]) 06/20/2020  . Pressure injury of skin 10/15/2017  . Abdominal pain 10/14/2017  . IBS (irritable bowel syndrome) 10/14/2017  . Anxiety 10/14/2017  . Anemia 10/14/2017  . 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 OA (osteoarthritis) of hip 02/20/2014  . Essential hypertension 12/03/2013  . Hyperlipidemia 12/03/2013  . Diabetes ([place]) 12/03/2013    Past Surgical History:  Procedure Laterality Date  . APPENDECTOMY    . BACK SURGERY     x2  . cataract surgery      bilateral   . CHOLECYSTECTOMY  1999  . EYE SURGERY     for glaucoma   . left eye peeling surgery     . left femur bone surgery      1978  . REVERSE SHOULDER ARTHROPLASTY Right 10/10/2014   Procedure: RIGHT REVERSE SHOULDER  ARTHROPLASTY;  Surgeon: Justice Britain, MD;  Location: [facility];  Service: Orthopedics;  Laterality: Right;  . ROTATOR CUFF REPAIR    . TOTAL HIP ARTHROPLASTY Left 02/20/2014   Procedure: LEFT TOTAL HIP ARTHROPLASTY ANTERIOR APPROACH;  Surgeon: Gearlean Alf, MD;  Location: [facility];  Service: Orthopedics;  Laterality: Left;  . WRIST SURGERY     left      OB History   No obstetric history on file.     Family History  Problem Relation Age of Onset  . Stroke Mother   . Hypertension Mother   . Ovarian cancer Sister     Social History   Tobacco Use  . Smoking status: Never Smoker  . Smokeless tobacco: Never Used  Vaping Use  . Vaping Use: Never used  Substance Use Topics  . Alcohol use: Yes    Comment: rare glass of wine   . Drug use: No    Home Medications Prior to Admission medications   Medication Sig Start Date End Date Taking? Authorizing Provider  ALPRAZolam (XANAX) 0.25 MG tablet Take 1 tablet (0.25 mg total) by mouth at bedtime. Patient taking differently: Take 0.25 mg by mouth at bedtime as needed for sleep.  10/20/17  Yes Elwyn Reach, MD  aspirin EC 81 MG tablet Take 81 mg by mouth daily as needed for mild pain (or sinus pressure).    Yes [provider]  B Complex Vitamins (VITAMIN-B COMPLEX PO) Take 1 tablet by mouth 2 (two) times a week.   Yes [provider]  docusate sodium 100 MG CAPS Take 100 mg by mouth 2 (two) times daily. Patient taking differently: Take 100 mg by mouth at bedtime.  02/25/14  Yes Perkins, Alexzandrew L, PA-C  gabapentin (NEURONTIN) 300 MG capsule Take 300 mg by mouth See admin instructions. Take 300 mg by mouth in the morning & at bedtime and an additional 300 mg during the day as needed for pain   Yes [provider]  losartan (COZAAR) 100 MG tablet Take 100 mg by mouth at bedtime.    Yes [provider]  MELATONIN PO Take 1 tablet by mouth at bedtime.   Yes [provider]  metFORMIN (GLUCOPHAGE)  500 MG tablet Take 500 mg by mouth at bedtime.    Yes [provider]  mirtazapine (REMERON) 15 MG tablet Take 15 mg by mouth at bedtime.   Yes [provider]  Multiple Vitamins-Minerals (PRESERVISION AREDS 2) CAPS Take 1 capsule by mouth daily with breakfast.   Yes [provider]  oxyCODONE-acetaminophen (PERCOCET) 10-325 MG tablet Take 1 tablet by mouth every 6 (six) hours as needed for pain. Patient taking differently: Take 0.5-1 tablets by mouth See admin instructions. Take 0.5-1 tablet by mouth in the morning & at bedtime (scheduled) and an additional 0.5-1 tablet during the day as needed for pain 10/20/17  Yes Garba, Henderson Newcomer, MD  Polyethyl Glyc-Propyl Glyc PF (SYSTANE ULTRA PF) 0.4-0.3 % SOLN Place 1-2 drops into both eyes 3 (three) times daily as needed (for irritation).    Yes [provider]  sertraline (ZOLOFT) 50 MG tablet Take 50 mg by mouth at bedtime.  08/27/17  Yes [provider]  SIMPLY SLEEP 25 MG tablet Take 25 mg by mouth at bedtime as needed for sleep.   Yes [provider]  sodium chloride (MURO 128) 5 % ophthalmic ointment Place 1 application into the right eye at bedtime.   Yes [provider]  guaiFENesin (MUCINEX) 600 MG 12 hr tablet Take 1 tablet (600 mg total) by mouth 2 (two) times daily. Patient not taking: Reported on 06/20/2020 10/20/17   Elwyn Reach, MD    Allergies    Robaxin [methocarbamol], Ancef [cefazolin], Morphine and related, Penicillins, Tape, and Flurbiprofen  Review of Systems   Review of Systems  Constitutional: Negative for chills and fever.  Respiratory: Negative for shortness of breath.   Cardiovascular: Negative for chest pain.  Gastrointestinal: Negative for abdominal pain.  Musculoskeletal: Positive for arthralgias and gait problem. Negative for back pain and neck pain.  Skin: Negative for wound.  Allergic/Immunologic: Positive for immunocompromised state.  Neurological:  Negative for dizziness and light-headedness.  Hematological: Does not bruise/bleed easily.  Psychiatric/Behavioral: Negative for confusion.  All other systems reviewed and are negative.   Physical Exam Updated Vital Signs BP (!) 145/87   Pulse 71   Temp 98.5 F (36.9 C) (Oral)   Resp 18   SpO2 98%   Physical Exam Vitals and nursing note reviewed.  Constitutional:      General: She is not in acute distress.    Appearance: She is well-developed. She is not diaphoretic.  HENT:     Head: Normocephalic and atraumatic.  Eyes:  Comments: Blind in right eye, prior left eye surgery of glaucoma. Left pupil irregular, patient does not know if this is new.   Cardiovascular:     Rate and Rhythm: Normal rate and regular rhythm.     Pulses: Normal pulses.     Heart sounds: Normal heart sounds.  Pulmonary:     Effort: Pulmonary effort is normal.     Breath sounds: Normal breath sounds.  Abdominal:     Palpations: Abdomen is soft.     Tenderness: There is no abdominal tenderness.  Musculoskeletal:        General: Tenderness present.     Right lower leg: No edema.     Left lower leg: No edema.     Comments: Pain with ROM or palpation over right hip. No pain in right knee or ankle, neck, back, upper extremities or left lower extremity.   Skin:    General: Skin is warm and dry.     Findings: No erythema or rash.  Neurological:     Mental Status: She is alert and oriented to person, place, and time.     Sensory: No sensory deficit.     Motor: No weakness.  Psychiatric:        Behavior: Behavior normal.     ED Results / Procedures / Treatments   Labs (all labs ordered are listed, but only abnormal results are displayed) Labs Reviewed  COMPREHENSIVE METABOLIC PANEL - Abnormal; Notable for the following components:      Result Value   Glucose, Bld 198 (*)    BUN 26 (*)    Total Protein 6.4 (*)    All other components within normal limits  CBC WITH DIFFERENTIAL/PLATELET -  Abnormal; Notable for the following components:   WBC 15.0 (*)    RBC 3.73 (*)    Hemoglobin 11.7 (*)    Neutro Abs 12.4 (*)    Abs Immature Granulocytes 0.21 (*)    All other components within normal limits  RESP PANEL BY RT-PCR (FLU A&B, COVID) ARPGX2  CBC  BASIC METABOLIC PANEL    EKG None  Radiology CT Head Wo Contrast  Result Date: 06/20/2020 CLINICAL DATA:  Tripped and fell EXAM: CT HEAD WITHOUT CONTRAST TECHNIQUE: Contiguous axial images were obtained from the base of the skull through the vertex without intravenous contrast. COMPARISON:  01/29/2020 FINDINGS: Brain: No acute infarct or hemorrhage. Lateral ventricles and midline structures are stable. No acute extra-axial fluid collections. No mass effect. Vascular: No hyperdense vessel or unexpected calcification. Skull: Normal. Negative for fracture or focal lesion. Sinuses/Orbits: No acute finding. Other: None. IMPRESSION: 1. Stable head CT, no acute process. Electronically Signed   By: Randa Ngo M.D.   On: 06/20/2020 16:59   CT Cervical Spine Wo Contrast  Result Date: 06/20/2020 CLINICAL DATA:  Tripped and fell EXAM: CT CERVICAL SPINE WITHOUT CONTRAST TECHNIQUE: Multidetector CT imaging of the cervical spine was performed without intravenous contrast. Multiplanar CT image reconstructions were also generated. COMPARISON:  01/29/2020 FINDINGS: Alignment: Alignment is stable, with minimal anterolisthesis of C3 on C4 and C4 on C5. This is likely due to extensive facet hypertrophic changes. Skull base and vertebrae: No acute fracture. No primary bone lesion or focal pathologic process. Chronic mild compression deformities of T1 and T2 unchanged. Soft tissues and spinal canal: No prevertebral fluid or swelling. No visible canal hematoma. Disc levels: Marked degenerative changes at C1-C2 with significant pannus, stable. Stable multilevel spondylosis greatest at C5-6 and C6-7. Diffuse facet hypertrophy  unchanged. Upper chest: Airway is  patent. Biapical pleural and parenchymal scarring unchanged. Other: Reconstructed images demonstrate no additional findings. IMPRESSION: 1. No acute cervical spine fracture. 2. Stable multilevel degenerative changes. Electronically Signed   By: Randa Ngo M.D.   On: 06/20/2020 17:04   CT PELVIS WO CONTRAST  Result Date: 06/20/2020 CLINICAL DATA:  Pelvic fractures. EXAM: CT PELVIS WITHOUT CONTRAST TECHNIQUE: Multidetector CT imaging of the pelvis was performed following the standard protocol without intravenous contrast. COMPARISON:  Hip x-ray June 20, 2020. CT of the abdomen and pelvis June 20, 2018 FINDINGS: Urinary Tract: The bladder is mildly distended but otherwise normal. Distal ureters are unremarkable. The lower pole the right kidney is unremarkable. Bowel: Colonic diverticulosis is seen without diverticulitis. The bowel is otherwise unremarkable. Vascular/Lymphatic: Calcified atherosclerosis is seen in the distal abdominal aorta, iliac vessels, and femoral vessels. Reproductive:  No mass or other significant abnormality Other:  None. Musculoskeletal: There is a mild displaced fracture of the right inferior pubic ramus. There is a displaced comminuted fracture of the right superior pubic ramus. No extension into the acetabulum identified. There is a fracture through the posterior right iliac bone best seen on coronal image 90 and 91. This fracture is also seen on axial images 14 and 15. There appears to be subtle extension into the right SI joint as seen on coronal image 90. No widening of the SI joint identified. The fracture lines also extend into the base of the right iliac crest as seen on sagittal image 55 and coronal image 70. IMPRESSION: 1. Mildly displaced comminuted right inferior pubic ramus fracture. 2. Displaced comminuted fracture of the right superior pubic ramus. 3. Fractures in the right iliac bone. Posterior fracture lines appear to extend into the SI joint although this is  subtle and there is no widening of the SI joint. The fractures also extend into the base of the right iliac crest as above. 4. There may be a subtle fracture through the superior most aspect of the left pubic ramus is seen on axial image 41. Electronically Signed   By: Dorise Bullion III M.D   On: 06/20/2020 17:58   DG Hip Unilat With Pelvis 2-3 Views Right  Result Date: 06/20/2020 CLINICAL DATA:  86 year old female with fall. Right hip pain. EXAM: DG HIP (WITH OR WITHOUT PELVIS) 2-3V RIGHT COMPARISON:  CT abdomen pelvis dated 07/17/2018. 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 The arthroplasty components appear intact and in anatomic alignment. There is a minimally displaced fracture of the right superior and inferior pubic rami. No other acute fracture. There is no dislocation. The bones are osteopenic. Vascular calcifications noted. The soft tissues are otherwise unremarkable. IMPRESSION: Minimally displaced fractures of the right superior and inferior pubic rami. Electronically Signed   By: Anner Crete M.D.   On: 06/20/2020 16:36    Procedures Procedures (including critical care time)  Medications Ordered in ED Medications  oxyCODONE-acetaminophen (PERCOCET) 10-325 MG per tablet 0.5-1 tablet (has no administration in time range)  losartan (COZAAR) tablet 100 mg (has no administration in time range)  mirtazapine (REMERON) tablet 15 mg (has no administration in time range)  sertraline (ZOLOFT) tablet 50 mg (has no administration in time range)  melatonin tablet 3 mg (has no administration in time range)  gabapentin (NEURONTIN) capsule 300 mg (has no administration in time range)  PreserVision AREDS 2 CAPS 1 capsule (has no administration in time range)  Polyethyl Glyc-Propyl Glyc PF 0.4-0.3 % SOLN 1-2 drop (has  no administration in time range)  sodium chloride (MURO 128) 5 % ophthalmic ointment 1 application (has no administration in time range)  enoxaparin (LOVENOX) injection  40 mg (has no administration in time range)  HYDROmorphone (DILAUDID) injection 0.5 mg (has no administration in time range)  diphenhydrAMINE (BENADRYL) capsule 25 mg (has no administration in time range)  polyethylene glycol (MIRALAX / GLYCOLAX) packet 17 g (has no administration in time range)  HYDROmorphone (DILAUDID) injection 0.5 mg (0.5 mg Intravenous Given 06/20/20 1925)  ondansetron (ZOFRAN) injection 4 mg (4 mg Intravenous Given 06/20/20 1925)    ED Course  I have reviewed the triage vital signs and the nursing notes.  Pertinent labs & imaging results that were available during my care of the patient were reviewed by me and considered in my medical decision making (see chart for details).  Clinical Course as of Jun 20 2100  Fri Jun 20, 2872  1772 86 year old female brought in by EMS from home after a fall today with complaint of right hip pain. Found to have pain to right hip/pelvis.  CT pelvis with multiple fractures (right inferior and superior pubic ramus, left superior pubic ramus, right iliac to SI). Discussed with Dr. Veverly Fells, on call with [facility] (patient has seen Dr. Wynelle Link in the past and requests [facility]), weight bear as tolerated, admit to medicine, ortho will consult tomorrow.    [LM]  2100 Case discussed with Dr. Trilby Drummer with [facility] who will consult for admission.    [LM]    Clinical Course User Index [LM] Roque Lias   MDM Rules/Calculators/A&P                          Final Clinical Impression(s) / ED Diagnoses Final diagnoses:  Fall, initial encounter  Multiple closed fractures of pelvis with stable disruption of pelvic ring, initial encounter [facility])    Rx / DC Orders ED Discharge Orders    None       Roque Lias 06/20/20 2102    Carmin Muskrat, MD 06/21/20 [phone]

## 2020-06-20 NOTE — ED Notes (Signed)
Patient brought in EMS d/t fall. Patient states she lives alone and got caught on the rug, fell and hit her R hip. No shortening noted or rotation. Patient neuro intact.   A0x4, HOH, patient states she hit her head but denies LOC.   Daughter at bedside.   BP (!) 166/143   Pulse 77   Temp 98.5 F (36.9 C) (Oral)   Resp 20   SpO2 95%   No obvious trauma noted to hip, no open wounds noted.

## 2020-06-20 NOTE — H&P (Addendum)
History and Physical   TEASHA MURRILLO GHW:[id number] DOB: Dec 28, 1924 DOA: 06/20/2020  PCP: Crist Infante, MD   Patient coming from: Home  Chief Complaint: Fall, hip pain  HPI: Marissa Ryan is a 86 y.o. female with medical history significant of anxiety, diabetes, hypertension, hyperlipidemia, IBS, OA, ophthalmic issues who presents after a fall at home.  Patient tripped on a rug while walking in her home.  She fell on her right hip and reports that she hit her head on the cabinet.  She is not taking any blood thinners and she denies any loss of consciousness.  Following the fall she was able to stand, but could not truly walk.  She supported herself with her upper body so that she gave to the phone to call her granddaughter in law. Her pain is about a 7-8/10 in her right hip.  She has had some pain medication which is only provided minimal relief thus far.  Her pain is better if she does not move and is triggered by movement. She denies fever, chills, chest pain, shortness of breath, abdominal pain, nausea, constipation, diarrhea  ED Course: Vital signs significant for blood pressure in the 789F systolic.  BMP showed glucose one ninety-eight.  LFTs show protein 6.4.  CBC showed leukocytosis to fifteen and hemoglobin stable at 11.7.  Respiratory panel for flu and Covid is negative.  Hip x-ray showed right pubic rami fractures.  CT of the pelvis showed right pubic rami fractures, right iliac fracture, possible left pubic rami fracture.  CT head and CT C-spine were without acute abnormalities.  Ortho consulted in ED and recommended admission and will see patient tomorrow morning.  Patient is to bear weight as able.  Review of Systems: As per HPI otherwise all other systems reviewed and are negative.  Past Medical History:  Diagnosis Date  . Anxiety   . Arthritis   . Cancer ([place])    hx of skin cancer removal on hand   . Colitis   . Diabetes mellitus without complication ([place])   . Gallstones   .  Hyperlipidemia   . Hypertension   . IBS (irritable bowel syndrome)   . Osteoporosis     Past Surgical History:  Procedure Laterality Date  . APPENDECTOMY    . BACK SURGERY     x2  . cataract surgery      bilateral   . CHOLECYSTECTOMY  1999  . EYE SURGERY     for glaucoma   . left eye peeling surgery     . left femur bone surgery      1978  . REVERSE SHOULDER ARTHROPLASTY Right 10/10/2014   Procedure: RIGHT REVERSE SHOULDER ARTHROPLASTY;  Surgeon: Justice Britain, MD;  Location: [facility];  Service: Orthopedics;  Laterality: Right;  . ROTATOR CUFF REPAIR    . TOTAL HIP ARTHROPLASTY Left 02/20/2014   Procedure: LEFT TOTAL HIP ARTHROPLASTY ANTERIOR APPROACH;  Surgeon: Gearlean Alf, MD;  Location: [facility];  Service: Orthopedics;  Laterality: Left;  . WRIST SURGERY     left     Social History  reports that she has never smoked. She has never used smokeless tobacco. She reports current alcohol use. She reports that she does not use drugs.  Allergies  Allergen Reactions  . Robaxin [Methocarbamol] Rash and Other (See Comments)    Red rash on right side of face and forehead cleared after benadryl given, per [name], RN  . Ancef [Cefazolin] Diarrhea  . Morphine And Related Itching  .  Penicillins Itching    Tolerated Unasyn Has patient had a PCN reaction causing immediate rash, facial/tongue/throat swelling, SOB or lightheadedness with hypotension: Yes Has patient had a PCN reaction causing severe rash involving mucus membranes or skin necrosis: No Has patient had a PCN reaction that required hospitalization: Was in hospital Has patient had a PCN reaction occurring within the last 10 years: No If all of the above answers are "NO", then may proceed with Cephalosporin use.   . Tape Other (See Comments)    Bandages and leads cause bruising and weeping at sites  . Flurbiprofen Diarrhea    Family History  Problem Relation Age of Onset  . Stroke Mother   . Hypertension Mother   . Ovarian  cancer Sister   Reviewed on admission  Prior to Admission medications   Medication Sig Start Date End Date Taking? Authorizing Provider  ALPRAZolam (XANAX) 0.25 MG tablet Take 1 tablet (0.25 mg total) by mouth at bedtime. Patient taking differently: Take 0.25 mg by mouth at bedtime as needed for sleep.  10/20/17  Yes Elwyn Reach, MD  aspirin EC 81 MG tablet Take 81 mg by mouth daily as needed for mild pain (or sinus pressure).    Yes [provider]  B Complex Vitamins (VITAMIN-B COMPLEX PO) Take 1 tablet by mouth 2 (two) times a week.   Yes [provider]  docusate sodium 100 MG CAPS Take 100 mg by mouth 2 (two) times daily. Patient taking differently: Take 100 mg by mouth at bedtime.  02/25/14  Yes Perkins, Alexzandrew L, PA-C  gabapentin (NEURONTIN) 300 MG capsule Take 300 mg by mouth See admin instructions. Take 300 mg by mouth in the morning & at bedtime and an additional 300 mg during the day as needed for pain   Yes [provider]  losartan (COZAAR) 100 MG tablet Take 100 mg by mouth at bedtime.    Yes [provider]  MELATONIN PO Take 1 tablet by mouth at bedtime.   Yes [provider]  metFORMIN (GLUCOPHAGE) 500 MG tablet Take 500 mg by mouth at bedtime.    Yes [provider]  mirtazapine (REMERON) 15 MG tablet Take 15 mg by mouth at bedtime.   Yes [provider]  Multiple Vitamins-Minerals (PRESERVISION AREDS 2) CAPS Take 1 capsule by mouth daily with breakfast.   Yes [provider]  oxyCODONE-acetaminophen (PERCOCET) 10-325 MG tablet Take 1 tablet by mouth every 6 (six) hours as needed for pain. Patient taking differently: Take 0.5-1 tablets by mouth See admin instructions. Take 0.5-1 tablet by mouth in the morning & at bedtime (scheduled) and an additional 0.5-1 tablet during the day as needed for pain 10/20/17  Yes Garba, Henderson Newcomer, MD  Polyethyl Glyc-Propyl Glyc PF (SYSTANE ULTRA PF) 0.4-0.3 % SOLN Place  1-2 drops into both eyes 3 (three) times daily as needed (for irritation).    Yes [provider]  sertraline (ZOLOFT) 50 MG tablet Take 50 mg by mouth at bedtime.  08/27/17  Yes [provider]  SIMPLY SLEEP 25 MG tablet Take 25 mg by mouth at bedtime as needed for sleep.   Yes [provider]  sodium chloride (MURO 128) 5 % ophthalmic ointment Place 1 application into the right eye at bedtime.   Yes [provider]  guaiFENesin (MUCINEX) 600 MG 12 hr tablet Take 1 tablet (600 mg total) by mouth 2 (two) times daily. Patient not taking: Reported on 06/20/2020 10/20/17  Elwyn Reach, MD    Physical Exam: Vitals:   06/20/20 1800 06/20/20 1845 06/20/20 1915 06/20/20 1945  BP: (!) 148/97 (!) 172/69 (!) 178/72 (!) 145/87  Pulse: 78 78 73 71  Resp: (!) 21 (!) 23 12 18   Temp:      TempSrc:      SpO2: 94%   98%   Physical Exam Constitutional:      General: She is not in acute distress.    Appearance: Normal appearance.     Comments: Pleasant, thin elderly female  HENT:     Head: Normocephalic and atraumatic.     Mouth/Throat:     Mouth: Mucous membranes are moist.     Pharynx: Oropharynx is clear.  Eyes:     Extraocular Movements: Extraocular movements intact.     Pupils: Pupils are equal, round, and reactive to light.  Cardiovascular:     Rate and Rhythm: Normal rate and regular rhythm.     Pulses: Normal pulses.     Heart sounds: Normal heart sounds.  Pulmonary:     Effort: Pulmonary effort is normal. No respiratory distress.     Breath sounds: Normal breath sounds.  Abdominal:     General: Bowel sounds are normal. There is no distension.     Palpations: Abdomen is soft.     Tenderness: There is no abdominal tenderness.  Musculoskeletal:        General: No swelling or deformity.     Comments: Bilateral lower extremities neurovascularly intact  Skin:    General: Skin is warm and dry.  Neurological:     General: No focal deficit present.      Mental Status: Mental status is at baseline.    Labs on Admission: I have personally reviewed following labs and imaging studies  CBC: Recent Labs  Lab 06/20/20 1642  WBC 15.0*  NEUTROABS 12.4*  HGB 11.7*  HCT 36.1  MCV 96.8  PLT 865    Basic Metabolic Panel: Recent Labs  Lab 06/20/20 1642  NA 135  K 4.6  CL 101  CO2 25  GLUCOSE 198*  BUN 26*  CREATININE 0.88  CALCIUM 9.3    GFR: CrCl cannot be calculated (Unknown ideal weight.).  Liver Function Tests: Recent Labs  Lab 06/20/20 1642  AST 26  ALT 17  ALKPHOS 43  BILITOT 0.4  PROT 6.4*  ALBUMIN 3.6    Urine analysis:    Component Value Date/Time   COLORURINE YELLOW 11/26/2017 1412   APPEARANCEUR CLEAR 11/26/2017 1412   LABSPEC 1.011 11/26/2017 1412   PHURINE 6.0 11/26/2017 1412   GLUCOSEU NEGATIVE 11/26/2017 1412   [place] 11/26/2017 1412   [place] 11/26/2017 1412   KETONESUR NEGATIVE 11/26/2017 1412   PROTEINUR NEGATIVE 11/26/2017 1412   UROBILINOGEN 0.2 10/12/2014 0315   NITRITE NEGATIVE 11/26/2017 1412   LEUKOCYTESUR NEGATIVE 11/26/2017 1412    Radiological Exams on Admission: CT Head Wo Contrast  Result Date: 06/20/2020 CLINICAL DATA:  Tripped and fell EXAM: CT HEAD WITHOUT CONTRAST TECHNIQUE: Contiguous axial images were obtained from the base of the skull through the vertex without intravenous contrast. COMPARISON:  01/29/2020 FINDINGS: Brain: No acute infarct or hemorrhage. Lateral ventricles and midline structures are stable. No acute extra-axial fluid collections. No mass effect. Vascular: No hyperdense vessel or unexpected calcification. Skull: Normal. Negative for fracture or focal lesion. Sinuses/Orbits: No acute finding. Other: None. IMPRESSION: 1. Stable head CT, no acute process. Electronically Signed   By: Diana Eves.D.  On: 06/20/2020 16:59   CT Cervical Spine Wo Contrast  Result Date: 06/20/2020 CLINICAL DATA:  Tripped and fell EXAM: CT CERVICAL SPINE  WITHOUT CONTRAST TECHNIQUE: Multidetector CT imaging of the cervical spine was performed without intravenous contrast. Multiplanar CT image reconstructions were also generated. COMPARISON:  01/29/2020 FINDINGS: Alignment: Alignment is stable, with minimal anterolisthesis of C3 on C4 and C4 on C5. This is likely due to extensive facet hypertrophic changes. Skull base and vertebrae: No acute fracture. No primary bone lesion or focal pathologic process. Chronic mild compression deformities of T1 and T2 unchanged. Soft tissues and spinal canal: No prevertebral fluid or swelling. No visible canal hematoma. Disc levels: Marked degenerative changes at C1-C2 with significant pannus, stable. Stable multilevel spondylosis greatest at C5-6 and C6-7. Diffuse facet hypertrophy unchanged. Upper chest: Airway is patent. Biapical pleural and parenchymal scarring unchanged. Other: Reconstructed images demonstrate no additional findings. IMPRESSION: 1. No acute cervical spine fracture. 2. Stable multilevel degenerative changes. Electronically Signed   By: Randa Ngo M.D.   On: 06/20/2020 17:04   CT PELVIS WO CONTRAST  Result Date: 06/20/2020 CLINICAL DATA:  Pelvic fractures. EXAM: CT PELVIS WITHOUT CONTRAST TECHNIQUE: Multidetector CT imaging of the pelvis was performed following the standard protocol without intravenous contrast. COMPARISON:  Hip x-ray June 20, 2020. CT of the abdomen and pelvis June 20, 2018 FINDINGS: Urinary Tract: The bladder is mildly distended but otherwise normal. Distal ureters are unremarkable. The lower pole the right kidney is unremarkable. Bowel: Colonic diverticulosis is seen without diverticulitis. The bowel is otherwise unremarkable. Vascular/Lymphatic: Calcified atherosclerosis is seen in the distal abdominal aorta, iliac vessels, and femoral vessels. Reproductive:  No mass or other significant abnormality Other:  None. Musculoskeletal: There is a mild displaced fracture of the right  inferior pubic ramus. There is a displaced comminuted fracture of the right superior pubic ramus. No extension into the acetabulum identified. There is a fracture through the posterior right iliac bone best seen on coronal image 90 and 91. This fracture is also seen on axial images 14 and 15. There appears to be subtle extension into the right SI joint as seen on coronal image 90. No widening of the SI joint identified. The fracture lines also extend into the base of the right iliac crest as seen on sagittal image 55 and coronal image 70. IMPRESSION: 1. Mildly displaced comminuted right inferior pubic ramus fracture. 2. Displaced comminuted fracture of the right superior pubic ramus. 3. Fractures in the right iliac bone. Posterior fracture lines appear to extend into the SI joint although this is subtle and there is no widening of the SI joint. The fractures also extend into the base of the right iliac crest as above. 4. There may be a subtle fracture through the superior most aspect of the left pubic ramus is seen on axial image 41. Electronically Signed   By: Dorise Bullion III M.D   On: 06/20/2020 17:58   DG Hip Unilat With Pelvis 2-3 Views Right  Result Date: 06/20/2020 CLINICAL DATA:  86 year old female with fall. Right hip pain. EXAM: DG HIP (WITH OR WITHOUT PELVIS) 2-3V RIGHT COMPARISON:  CT abdomen pelvis dated 07/17/2018. 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 The arthroplasty components appear intact and in anatomic alignment. There is a minimally displaced fracture of the right superior and inferior pubic rami. No other acute fracture. There is no dislocation. The bones are osteopenic. Vascular calcifications noted. The soft tissues are otherwise unremarkable. IMPRESSION: Minimally displaced fractures of  the right superior and inferior pubic rami. Electronically Signed   By: Anner Crete M.D.   On: 06/20/2020 16:36    EKG: Not yet performed  Assessment/Plan Principal Problem:    Fracture of multiple pubic rami, right, closed, initial encounter [facility]) Active Problems:   Essential hypertension   Hyperlipidemia   Diabetes ([place])   Anxiety  Fracture of right pubic rami Fracture of right ilia > Fractures noted on x-ray and CT.  Negative head CT. > Patient fell after tripping over a rug at home > Ortho consulted in ED recommend weightbearing as able see patient in the morning - Appreciate Ortho recommendations - Weightbearing as able - Continue home Percocet and gabapentin - As needed Dilaudid for breakthrough pain - PT/OT evaluation  Leukocytosis > WBC fifteen on admission, no signs of infection > Suspect reactive leukocytosis -Trend white count  Anxiety/Sleep disturbance - Continue home Remeron, Zoloft, melatonin  Diabetes - SSI  Hypertension -Continue home losartan  Ophthalmic issues - Continue home eye drops  DVT prophylaxis: Lovenox Code Status:   Partial, no CPR, okay with intubation. Family Communication:  Grandson updated at bedside Disposition Plan:   Patient is from:  Home  Anticipated DC to:  Home  Anticipated DC date:  Pending clinical course  Anticipated DC barriers: None  Consults called:  Orthopedics consulted by EDP, will see patient in the morning  Admission status:  Observation, MedSurg   Severity of Illness: The appropriate patient status for this patient is OBSERVATION. Observation status is judged to be reasonable and necessary in order to provide the required intensity of service to ensure the patient's safety. The patient's presenting symptoms, physical exam findings, and initial radiographic and laboratory data in the context of their medical condition is felt to place them at decreased risk for further clinical deterioration. Furthermore, it is anticipated that the patient will be medically stable for discharge from the hospital within 2 midnights of admission. The following factors support the patient status of observation.    " The patient's presenting symptoms include fall, right hip pain. " The physical exam findings include right hip pain. " The initial radiographic and laboratory data are consistent with pubic rami and iliac fractures.  Also noted is leukocytosis.Marcelyn Bruins MD [facility]  How to contact the [facility] Attending or Consulting provider [place] or covering provider during after hours [place], for this patient?   1. Check the care team in [facility] and look for a) attending/consulting TRH provider listed and b) the [facility] team listed 2. Log into [email or web address] and use [place]'s universal password to access. If you do not have the password, please contact the hospital operator. 3. Locate the [facility] provider you are looking for under [facility] and page to a number that you can be directly reached. 4. If you still have difficulty reaching the provider, please page the [facility] (Director on Call) for the Hospitalists listed on amion for assistance.  06/20/2020, 9:17 PM

## 2020-06-21 DIAGNOSIS — Z66 Do not resuscitate: Secondary | ICD-10-CM | POA: Diagnosis present

## 2020-06-21 DIAGNOSIS — Z8249 Family history of ischemic heart disease and other diseases of the circulatory system: Secondary | ICD-10-CM | POA: Diagnosis not present

## 2020-06-21 DIAGNOSIS — R2681 Unsteadiness on feet: Secondary | ICD-10-CM | POA: Diagnosis not present

## 2020-06-21 DIAGNOSIS — M6281 Muscle weakness (generalized): Secondary | ICD-10-CM | POA: Diagnosis not present

## 2020-06-21 DIAGNOSIS — R262 Difficulty in walking, not elsewhere classified: Secondary | ICD-10-CM | POA: Diagnosis not present

## 2020-06-21 DIAGNOSIS — W2209XA Striking against other stationary object, initial encounter: Secondary | ICD-10-CM | POA: Diagnosis present

## 2020-06-21 DIAGNOSIS — Z880 Allergy status to penicillin: Secondary | ICD-10-CM | POA: Diagnosis not present

## 2020-06-21 DIAGNOSIS — W19XXXD Unspecified fall, subsequent encounter: Secondary | ICD-10-CM | POA: Diagnosis not present

## 2020-06-21 DIAGNOSIS — Z9049 Acquired absence of other specified parts of digestive tract: Secondary | ICD-10-CM | POA: Diagnosis not present

## 2020-06-21 DIAGNOSIS — Z96642 Presence of left artificial hip joint: Secondary | ICD-10-CM | POA: Diagnosis present

## 2020-06-21 DIAGNOSIS — Z96611 Presence of right artificial shoulder joint: Secondary | ICD-10-CM | POA: Diagnosis present

## 2020-06-21 DIAGNOSIS — S329XXD Fracture of unspecified parts of lumbosacral spine and pelvis, subsequent encounter for fracture with routine healing: Secondary | ICD-10-CM | POA: Diagnosis not present

## 2020-06-21 DIAGNOSIS — Y92239 Unspecified place in hospital as the place of occurrence of the external cause: Secondary | ICD-10-CM | POA: Diagnosis not present

## 2020-06-21 DIAGNOSIS — M542 Cervicalgia: Secondary | ICD-10-CM | POA: Diagnosis present

## 2020-06-21 DIAGNOSIS — H409 Unspecified glaucoma: Secondary | ICD-10-CM | POA: Diagnosis not present

## 2020-06-21 DIAGNOSIS — Z20822 Contact with and (suspected) exposure to covid-19: Secondary | ICD-10-CM | POA: Diagnosis present

## 2020-06-21 DIAGNOSIS — M810 Age-related osteoporosis without current pathological fracture: Secondary | ICD-10-CM | POA: Diagnosis not present

## 2020-06-21 DIAGNOSIS — F419 Anxiety disorder, unspecified: Secondary | ICD-10-CM | POA: Diagnosis present

## 2020-06-21 DIAGNOSIS — S32811D Multiple fractures of pelvis with unstable disruption of pelvic ring, subsequent encounter for fracture with routine healing: Secondary | ICD-10-CM | POA: Diagnosis not present

## 2020-06-21 DIAGNOSIS — Z885 Allergy status to narcotic agent status: Secondary | ICD-10-CM | POA: Diagnosis not present

## 2020-06-21 DIAGNOSIS — Z823 Family history of stroke: Secondary | ICD-10-CM | POA: Diagnosis not present

## 2020-06-21 DIAGNOSIS — E785 Hyperlipidemia, unspecified: Secondary | ICD-10-CM | POA: Diagnosis present

## 2020-06-21 DIAGNOSIS — S32301A Unspecified fracture of right ilium, initial encounter for closed fracture: Secondary | ICD-10-CM | POA: Diagnosis present

## 2020-06-21 DIAGNOSIS — G479 Sleep disorder, unspecified: Secondary | ICD-10-CM | POA: Diagnosis present

## 2020-06-21 DIAGNOSIS — Y9301 Activity, walking, marching and hiking: Secondary | ICD-10-CM | POA: Diagnosis present

## 2020-06-21 DIAGNOSIS — Z741 Need for assistance with personal care: Secondary | ICD-10-CM | POA: Diagnosis not present

## 2020-06-21 DIAGNOSIS — S32591A Other specified fracture of right pubis, initial encounter for closed fracture: Secondary | ICD-10-CM | POA: Diagnosis present

## 2020-06-21 DIAGNOSIS — Z8041 Family history of malignant neoplasm of ovary: Secondary | ICD-10-CM | POA: Diagnosis not present

## 2020-06-21 DIAGNOSIS — E119 Type 2 diabetes mellitus without complications: Secondary | ICD-10-CM | POA: Diagnosis present

## 2020-06-21 DIAGNOSIS — T402X5A Adverse effect of other opioids, initial encounter: Secondary | ICD-10-CM | POA: Diagnosis not present

## 2020-06-21 DIAGNOSIS — I10 Essential (primary) hypertension: Secondary | ICD-10-CM | POA: Diagnosis present

## 2020-06-21 DIAGNOSIS — K589 Irritable bowel syndrome without diarrhea: Secondary | ICD-10-CM | POA: Diagnosis not present

## 2020-06-21 DIAGNOSIS — R4189 Other symptoms and signs involving cognitive functions and awareness: Secondary | ICD-10-CM | POA: Diagnosis not present

## 2020-06-21 DIAGNOSIS — N390 Urinary tract infection, site not specified: Secondary | ICD-10-CM | POA: Diagnosis not present

## 2020-06-21 DIAGNOSIS — R278 Other lack of coordination: Secondary | ICD-10-CM | POA: Diagnosis not present

## 2020-06-21 DIAGNOSIS — Z85828 Personal history of other malignant neoplasm of skin: Secondary | ICD-10-CM | POA: Diagnosis not present

## 2020-06-21 DIAGNOSIS — S32592A Other specified fracture of left pubis, initial encounter for closed fracture: Secondary | ICD-10-CM | POA: Diagnosis present

## 2020-06-21 DIAGNOSIS — Z888 Allergy status to other drugs, medicaments and biological substances status: Secondary | ICD-10-CM | POA: Diagnosis not present

## 2020-06-21 DIAGNOSIS — G928 Other toxic encephalopathy: Secondary | ICD-10-CM | POA: Diagnosis not present

## 2020-06-21 DIAGNOSIS — Y92009 Unspecified place in unspecified non-institutional (private) residence as the place of occurrence of the external cause: Secondary | ICD-10-CM | POA: Diagnosis not present

## 2020-06-21 LAB — BASIC METABOLIC PANEL
Anion gap: 13 (ref 5–15)
BUN: 19 mg/dL (ref 8–23)
CO2: 24 mmol/L (ref 22–32)
Calcium: 9.1 mg/dL (ref 8.9–10.3)
Chloride: 100 mmol/L (ref 98–111)
Creatinine, Ser: 0.8 mg/dL (ref 0.44–1.00)
GFR, Estimated: >60 mL/min (ref >60)
Glucose, Bld: 191 mg/dL — ABNORMAL HIGH (ref 70–99)
Potassium: 4.3 mmol/L (ref 3.5–5.1)
Sodium: 137 mmol/L (ref 135–145)

## 2020-06-21 LAB — CBC
HCT: 32.6 % — ABNORMAL LOW (ref 36.0–46.0)
Hemoglobin: 10.9 g/dL — ABNORMAL LOW (ref 12.0–15.0)
MCH: 31.6 pg (ref 26.0–34.0)
MCHC: 33.4 g/dL (ref 30.0–36.0)
MCV: 94.5 fL (ref 80.0–100.0)
Platelets: 241 10*3/uL (ref 150–400)
RBC: 3.45 MIL/uL — ABNORMAL LOW (ref 3.87–5.11)
RDW: 13.2 % (ref 11.5–15.5)
WBC: 9.5 10*3/uL (ref 4.0–10.5)
nRBC: 0 % (ref 0.0–0.2)

## 2020-06-21 LAB — GLUCOSE, CAPILLARY
Glucose-Capillary: 150 mg/dL — ABNORMAL HIGH (ref 70–99)
Glucose-Capillary: 233 mg/dL — ABNORMAL HIGH (ref 70–99)
Glucose-Capillary: 144 mg/dL — ABNORMAL HIGH (ref 70–99)
Glucose-Capillary: 152 mg/dL — ABNORMAL HIGH (ref 70–99)

## 2020-06-21 MED ORDER — SENNOSIDES-DOCUSATE SODIUM 8.6-50 MG PO TABS
1.0000 | ORAL_TABLET | Freq: Two times a day (BID) | ORAL | Status: DC
  Administered 2020-06-21 – 2020-06-24 (×7): 1 via ORAL
  Filled 2020-06-21 (×7): qty 1

## 2020-06-21 MED ORDER — TRAMADOL HCL 50 MG PO TABS
25.0000 mg | ORAL_TABLET | Freq: Four times a day (QID) | ORAL | Status: DC | PRN
  Administered 2020-06-22 – 2020-06-24 (×6): 25 mg via ORAL
  Filled 2020-06-21 (×7): qty 1

## 2020-06-21 MED ORDER — ACETAMINOPHEN 500 MG PO TABS
500.0000 mg | ORAL_TABLET | Freq: Three times a day (TID) | ORAL | Status: DC
  Administered 2020-06-21 – 2020-06-24 (×10): 500 mg via ORAL
  Filled 2020-06-21 (×10): qty 1

## 2020-06-21 MED ORDER — LACTATED RINGERS IV SOLN: INTRAVENOUS | Status: AC

## 2020-06-21 MED ORDER — RINGERS IV SOLN: INTRAVENOUS | Status: DC

## 2020-06-21 MED ORDER — OXYCODONE HCL 5 MG PO TABS: 5.0000 mg | ORAL_TABLET | Freq: Four times a day (QID) | ORAL | Status: DC | PRN

## 2020-06-21 NOTE — Progress Notes (Signed)
Subjective: Pt c/o mild pain in r pelvis area.  O/w no complaints.   Objective: Vital signs in last 24 hours: Temp:  [98.4 F (36.9 C)-99.2 F (37.3 C)] 98.4 F (36.9 C) (12/04 0408) Pulse Rate:  [71-78] 77 (12/04 0408) Resp:  [12-24] 16 (12/04 0408) BP: (135-178)/(53-143) 146/53 (12/04 0408) SpO2:  [94 %-100 %] 96 % (12/04 0408)  Intake/Output from previous day: No intake/output data recorded. Intake/Output this shift: No intake/output data recorded.  Recent Labs    06/20/20 1642 06/21/20 0244  HGB 11.7* 10.9*   Recent Labs    06/20/20 1642 06/21/20 0244  WBC 15.0* 9.5  RBC 3.73* 3.45*  HCT 36.1 32.6*  PLT 283 241   Recent Labs    06/20/20 1642 06/21/20 0244  NA 135 137  K 4.6 4.3  CL 101 100  CO2 25 24  BUN 26* 19  CREATININE 0.88 0.80  GLUCOSE 198* 191*  CALCIUM 9.3 9.1   No results for input(s): LABPT, INR in the last 72 hours.  PE:  elderly HOH female resting comfortably in bed.  wiggles toes.  feels LT bilat feet.      Assessment/Plan: Stable pubic ramus fractures - PT for WB (50% on R) and ambulation.      Wylene Simmer 06/21/2020, 8:34 AM

## 2020-06-21 NOTE — TOC Initial Note (Signed)
Transition of Care (TOC) - Initial/Assessment Note    Patient Details  Name: Marissa Ryan MRN: [id number] Date of Birth: 06/17/1925  Transition of Care (TOC) CM/SW Contact:    ,  Michelle, LCSW Phone Number: 06/21/2020, 3:10 PM  Clinical Narrative:                 Patient is a 86 year old female admitted following a fall. This social worker met with patient and her granddaughter Heather at bedside to discuss SNF recommendation. Patient lives alone, rehab recommended to improve mobility. Patient describes having a strong support system of whom will be available to her upon her return from rehab. Patient no longer drives, has a cain and a walker and her granddaughter Heather runs her errands for her. Patient now agreeable to SNF but does not want to return to [facility]. SNF process discussed, FL2 completed and faxed out for bed offers. Once obtained. Bed offers will be reviewed with patient and her granddaughter.  Transition of Care to continued to follow    , LCSW Transition of Care [phone]   Expected Discharge Plan: Skilled Nursing Facility Barriers to Discharge: No Barriers Identified   Patient Goals and CMS Choice Patient states their goals for this hospitalization and ongoing recovery are:: "I want to walk again" CMS Medicare.gov Compare Post Acute Care list provided to:: Patient Choice offered to / list presented to : Patient  Expected Discharge Plan and Services Expected Discharge Plan: Skilled Nursing Facility In-house Referral: Clinical Social Work     Living arrangements for the past 2 months: Single Family Home                                      Prior Living Arrangements/Services Living arrangements for the past 2 months: Single Family Home Lives with:: Self Patient language and need for interpreter reviewed:: Yes Do you feel safe going back to the place where you live?: Yes      Need for Family Participation in Patient Care:  Yes (Comment) Care giver support system in place?: Yes (comment) Current home services: DME (cain and a walker) Criminal Activity/Legal Involvement Pertinent to Current Situation/Hospitalization: No - Comment as needed  Activities of Daily Living      Permission Sought/Granted   Permission granted to share information with : Yes, Verbal Permission Granted        Permission granted to share info w Relationship: Grandaughter-Heather [phone]  Permission granted to share info w Contact Information: [phone]  Emotional Assessment Appearance:: Appears stated age Attitude/Demeanor/Rapport: Engaged, Self-Confident Affect (typically observed): Accepting Orientation: : Oriented to Self, Oriented to Place, Oriented to  Time, Oriented to Situation Alcohol / Substance Use: Never Used Psych Involvement: No (comment)  Admission diagnosis:  Fall, initial encounter [W19.XXXA] Fracture of multiple pubic rami, right, closed, initial encounter (HCC) [S32.591A] Multiple closed fractures of pelvis with stable disruption of pelvic ring, initial encounter (HCC) [S32.810A] Patient Active Problem List   Diagnosis Date Noted  . Fracture of multiple pubic rami, right, closed, initial encounter (HCC) 06/20/2020  . Pressure injury of skin 10/15/2017  . Abdominal pain 10/14/2017  . IBS (irritable bowel syndrome) 10/14/2017  . Anxiety 10/14/2017  . Anemia 10/14/2017  . 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 OA (osteoarthritis) of hip 02/20/2014  . Essential hypertension 12/03/2013  . Hyperlipidemia 12/03/2013  . Diabetes (HCC) 12/03/2013   PCP:  Perini, Mark, MD   Pharmacy:   [facility] [address] [place]), [place] [address] [place] [address] Phone: [phone] Fax: [phone]     Social Determinants of Health (SDOH) Interventions    Readmission Risk Interventions No flowsheet data found.

## 2020-06-21 NOTE — [place] FL2 (Signed)
[place] MEDICAID FL2 LEVEL OF CARE SCREENING TOOL     IDENTIFICATION  Patient Name: Marissa Ryan Birthdate: [birth year] Sex: female Admission Date (Current Location): 06/20/2020  [facility] and [place] Number:  Herbalist and Address:  [facility], [place] [address] [place], [place] [address]      Provider Number: [id number]  Attending Physician Name and Address:  Elmarie Shiley, MD  Relative Name and Phone Number:  [name] [phone]    Current Level of Care: Hospital Recommended Level of Care: [place] Prior Approval Number:    Date Approved/Denied:   PASRR Number: [id number]  Discharge Plan: SNF    Current Diagnoses: Patient Active Problem List   Diagnosis Date Noted  . Fracture of multiple pubic rami, right, closed, initial encounter ([place]) 06/20/2020  . Pressure injury of skin 10/15/2017  . Abdominal pain 10/14/2017  . IBS (irritable bowel syndrome) 10/14/2017  . Anxiety 10/14/2017  . Anemia 10/14/2017  . 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 OA (osteoarthritis) of hip 02/20/2014  . Essential hypertension 12/03/2013  . Hyperlipidemia 12/03/2013  . Diabetes ([place]) 12/03/2013    Orientation RESPIRATION BLADDER Height & Weight     Self, Time, Place, Situation  Normal Continent Weight:   Height:     BEHAVIORAL SYMPTOMS/MOOD NEUROLOGICAL BOWEL NUTRITION STATUS      Continent Diet  AMBULATORY STATUS COMMUNICATION OF NEEDS Skin   Extensive Assist Verbally Normal                       Personal Care Assistance Level of Assistance  Bathing, Feeding, Dressing Bathing Assistance: Limited assistance Feeding assistance: Independent Dressing Assistance: Limited assistance     Functional Limitations Info  Sight, Hearing, Speech Sight Info: Adequate Hearing Info: Adequate Speech Info: Adequate    SPECIAL CARE FACTORS FREQUENCY  PT (By licensed PT), OT (By licensed OT)     PT  Frequency: 5x per week OT Frequency: 5x per week            Contractures Contractures Info: Not present    Additional Factors Info  Code Status Code Status Info: partial code             Current Medications (06/21/2020):  This is the current hospital active medication list Current Facility-Administered Medications  Medication Dose Route Frequency Provider Last Rate Last Admin  . acetaminophen (TYLENOL) tablet 500 mg  500 mg Oral TID Regalado, Belkys A, MD   500 mg at 06/21/20 0915  . enoxaparin (LOVENOX) injection 40 mg  40 mg Subcutaneous Q24H Marcelyn Bruins, MD   40 mg at 06/20/20 2341  . gabapentin (NEURONTIN) capsule 300 mg  300 mg Oral BID Marcelyn Bruins, MD   300 mg at 06/20/20 2340  . insulin aspart (novoLOG) injection 0-9 Units  0-9 Units Subcutaneous TID WC Marcelyn Bruins, MD   3 Units at 06/21/20 1345  . lactated ringers infusion   Intravenous Continuous Regalado, Belkys A, MD 45 mL/hr at 06/21/20 0929 New Bag at 06/21/20 0929  . losartan (COZAAR) tablet 100 mg  100 mg Oral QHS Marcelyn Bruins, MD   100 mg at 06/20/20 2339  . melatonin tablet 3 mg  3 mg Oral QHS Marcelyn Bruins, MD   3 mg at 06/20/20 2340  . mirtazapine (REMERON) tablet 15 mg  15 mg Oral QHS Marcelyn Bruins, MD   15 mg at 06/20/20 2340  .  multivitamin (PROSIGHT) tablet 1 tablet  1 tablet Oral Q breakfast Marcelyn Bruins, MD   1 tablet at 06/21/20 0915  . polyethylene glycol (MIRALAX / GLYCOLAX) packet 17 g  17 g Oral Daily PRN Marcelyn Bruins, MD      . polyvinyl alcohol (LIQUIFILM TEARS) 1.4 % ophthalmic solution 1-2 drop  1-2 drop Both Eyes TID PRN Marcelyn Bruins, MD      . senna-docusate (Senokot-S) tablet 1 tablet  1 tablet Oral BID Regalado, Belkys A, MD   1 tablet at 06/21/20 0917  . sertraline (ZOLOFT) tablet 50 mg  50 mg Oral QHS Marcelyn Bruins, MD   50 mg at 06/20/20 2340  . traMADol (ULTRAM) tablet 25 mg  25 mg Oral Q6H PRN Regalado, Belkys A, MD          Discharge Medications: Please see discharge summary for a list of discharge medications.  Relevant Imaging Results:  Relevant Lab Results:   Additional Information SSN [id number]  Elliot Gurney [place], [place]

## 2020-06-21 NOTE — Evaluation (Signed)
Occupational Therapy Evaluation Patient Details Name: Marissa Ryan MRN: [id number] DOB: 05-Jul-1925 Today's Date: 06/21/2020    History of Present Illness Marissa Ryan is a 86 y.o. female with medical history significant of anxiety, diabetes, hypertension, hyperlipidemia, IBS, OA, ophthalmic issues who presents after a fall at home.  Stable pelvic fractures including bilateral pubic rami fractures and a right ileum fracture which extends into the SI joint.   Clinical Impression   Patient admitted with the above diagnosis.  Deficits are listed below.  PTA she lived alone, occasional use of RW in the home, but used a cane in the community.  Daughter, Granddaughter, and neighbors assist as needed with community mobility and home management.  She completes the bulk of her meal prep, meds, and light home management.  Family has been discussing a transition to ALF, but she has refused.  Currently pain is limiting ADL and mobility independence.  She is needing up to Mod A for basic mobility and closer to Max A for Lower Body ADL and toileting.  The patient wishes to go home, but OT recommends SNF, and family agrees.  OT will continue to follow in the acute setting.       Follow Up Recommendations  SNF;Supervision/Assistance - 24 hour    Equipment Recommendations  3 in 1 bedside commode;Wheelchair (measurements OT);Wheelchair cushion (measurements OT)    Recommendations for Other Services       Precautions / Restrictions Precautions Precautions: Fall Restrictions Weight Bearing Restrictions: Yes LUE Weight Bearing: Weight bearing as tolerated RLE Weight Bearing: Partial weight bearing RLE Partial Weight Bearing Percentage or Pounds: 50% Other Position/Activity Restrictions: History of vetebral fx's and kyphotic.      Mobility Bed Mobility Overal bed mobility: Needs Assistance Bed Mobility: Rolling;Supine to Sit Rolling: Mod assist   Supine to sit: Mod assist;HOB elevated           Transfers Overall transfer level: Needs assistance   Transfers: Sit to/from Stand;Stand Pivot Transfers Sit to Stand: Mod assist Stand pivot transfers: Mod assist            Balance Overall balance assessment: Needs assistance Sitting-balance support: Bilateral upper extremity supported Sitting balance-Leahy Scale: Fair     Standing balance support: Bilateral upper extremity supported Standing balance-Leahy Scale: Poor                             ADL either performed or assessed with clinical judgement   ADL Overall ADL's : Needs assistance/impaired Eating/Feeding: Independent;Bed level   Grooming: Wash/dry hands;Wash/dry face;Set up;Sitting   Upper Body Bathing: Set up;Sitting   Lower Body Bathing: Moderate assistance;Maximal assistance;Sitting/lateral leans   Upper Body Dressing : Minimal assistance;Sitting   Lower Body Dressing: Maximal assistance;Sit to/from stand               Functional mobility during ADLs: Moderate assistance;Rolling walker General ADL Comments: did pretty good with 50% and pushing through RW     Vision Baseline Vision/History: Legally blind Additional Comments: 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     Perception     Praxis      Pertinent Vitals/Pain Pain Assessment: Faces Faces Pain Scale: Hurts little more Pain Location: R groin Pain Descriptors / Indicators: Grimacing;Guarding;Sharp Pain Intervention(s): Monitored during session;Repositioned     Hand Dominance Left   Extremity/Trunk Assessment Upper Extremity Assessment Upper Extremity Assessment: Overall WFL for tasks assessed   Lower Extremity Assessment Lower Extremity Assessment: Defer  to PT evaluation   Cervical / Trunk Assessment Cervical / Trunk Assessment: Kyphotic   Communication Communication Communication: HOH   Cognition Arousal/Alertness: Awake/alert Behavior During Therapy: WFL for tasks assessed/performed Overall Cognitive Status: Within  Functional Limits for tasks assessed                                 General Comments: Granddaughter reports she is at her baeline, but is a little groggy.  Not accurate with all details regarding home setup.   General Comments       Exercises     Shoulder Instructions      Home Living Family/patient expects to be discharged to:: Private residence Living Arrangements: Alone Available Help at Discharge: Family;Available PRN/intermittently Type of Home: House Home Access: Stairs to enter [employer] of Steps: 2 Entrance Stairs-Rails: Right Home Layout: One level     Bathroom Shower/Tub: Teacher, early years/pre: Standard Bathroom Accessibility: Yes How Accessible: Accessible via walker Home Equipment: [place] - 2 wheels;Walker - 4 wheels;Cane - single point   Additional Comments: patient does not use shower      Prior Functioning/Environment Level of Independence: Independent with assistive device(s)        Comments: Occasional use of RW in the home and [facility] for community mobility.        OT Problem List: Decreased strength;Decreased activity tolerance;Impaired balance (sitting and/or standing);Decreased knowledge of use of DME or AE;Pain;Decreased knowledge of precautions      OT Treatment/Interventions: Self-care/ADL training;Therapeutic exercise;DME and/or AE instruction;Balance training;Patient/family education;Therapeutic activities    OT Goals(Current goals can be found in the care plan section) Acute Rehab OT Goals Patient Stated Goal: I want to go home OT Goal Formulation: With patient Time For Goal Achievement: 07/05/20 Potential to Achieve Goals: Fair  OT Frequency: Min 2X/week   Barriers to D/C:    none noted       Co-evaluation              AM-PAC OT "6 Clicks" Daily Activity     Outcome Measure Help from another person eating meals?: None Help from another person taking care of personal grooming?: A  Little Help from another person toileting, which includes using toliet, bedpan, or urinal?: A Lot Help from another person bathing (including washing, rinsing, drying)?: A Lot Help from another person to put on and taking off regular upper body clothing?: A Little Help from another person to put on and taking off regular lower body clothing?: A Lot 6 Click Score: 16   End of Session Equipment Utilized During Treatment: Gait belt;Rolling walker Nurse Communication: Mobility status  Activity Tolerance: Patient limited by pain Patient left: in chair;with call bell/phone within reach;with chair alarm set;with family/visitor present  OT Visit Diagnosis: Unsteadiness on feet (R26.81);Muscle weakness (generalized) (M62.81);History of falling (Z91.81);Pain Pain - Right/Left: Right Pain - part of body: Leg                Time: 9562-1308 OT Time Calculation (min): 21 min Charges:  OT General Charges $OT Visit: 1 Visit OT Evaluation $OT Eval Moderate Complexity: 1 Mod  06/21/2020  Rich, OTR/L  Acute Rehabilitation Services  Office:  [phone]   Metta Clines 06/21/2020, 12:20 PM

## 2020-06-21 NOTE — Plan of Care (Signed)
  Problem: Education: Goal: Knowledge of General Education information will improve Description: Including pain rating scale, medication(s)/side effects and non-pharmacologic comfort measures Outcome: Progressing   Problem: Clinical Measurements: Goal: Ability to maintain clinical measurements within normal limits will improve Outcome: Progressing Goal: Will remain free from infection Outcome: Progressing Goal: Diagnostic test results will improve Outcome: Progressing Goal: Respiratory complications will improve Outcome: Progressing Goal: Cardiovascular complication will be avoided Outcome: Progressing   Problem: Coping: Goal: Level of anxiety will decrease Outcome: Progressing   Problem: Nutrition: Goal: Adequate nutrition will be maintained Outcome: Progressing   Problem: Elimination: Goal: Will not experience complications related to bowel motility Outcome: Progressing Goal: Will not experience complications related to urinary retention Outcome: Progressing   Problem: Pain Managment: Goal: General experience of comfort will improve Outcome: Progressing   Problem: Safety: Goal: Ability to remain free from injury will improve Outcome: Progressing   Problem: Skin Integrity: Goal: Risk for impaired skin integrity will decrease Outcome: Progressing

## 2020-06-21 NOTE — Progress Notes (Signed)
Patient admited to the unit from ED. Patient is alert and oriented to person, place and time. Complaint of pain with movement. Patient is stable and resting.Call bell within reach.Will continue to monitor.

## 2020-06-21 NOTE — Evaluation (Signed)
Physical Therapy Evaluation Patient Details Name: Marissa Ryan MRN: [id number] DOB: December 20, 1924 Today's Date: 06/21/2020   History of Present Illness  Marissa Ryan is a 86 y.o. female with medical history significant of anxiety, diabetes, hypertension, hyperlipidemia, IBS, OA, ophthalmic issues who presents after a fall at home.  Stable pelvic fractures including bilateral pubic rami fractures and a right ileum fracture which extends into the SI joint.  Clinical Impression  Patient required mod a for bed mobility and mod a to transfer to a chair. She lives alone and will have some help at home but not 24 hours. She would benefit from rehab at a SNF to improve mobility. She did well with her wweight bearing status. She was able to take 3 small steps to get to the chair.     Follow Up Recommendations SNF    Equipment Recommendations  None recommended by PT    Recommendations for Other Services Rehab consult     Precautions / Restrictions Precautions Precautions: Fall Restrictions Weight Bearing Restrictions: Yes LUE Weight Bearing: Weight bearing as tolerated RLE Weight Bearing: Partial weight bearing RLE Partial Weight Bearing Percentage or Pounds: 50%  Other Position/Activity Restrictions: History of vetebral fx's and kyphotic.      Mobility  Bed Mobility Overal bed mobility: Needs Assistance Bed Mobility: Rolling;Supine to Sit Rolling: Mod assist   Supine to sit: Mod assist;HOB elevated     General bed mobility comments: mod a to scoot to the edge of the bed and to sit      Transfers Overall transfer level: Needs assistance Equipment used: Rolling walker (2 wheeled) Transfers: Sit to/from [facility] Sit to Stand: Mod assist Stand pivot transfers: Mod assist       General transfer comment: mod a to take short steps to the chair. She appeared to adhere to weight bearing.   Ambulation/Gait Ambulation/Gait assistance: Mod assist Gait Distance  (Feet): 3 Feet Assistive device: Rolling walker (2 wheeled) Gait Pattern/deviations: Step-to pattern Gait velocity: decreased  Gait velocity interpretation: <1.31 ft/sec, indicative of household ambulator General Gait Details: short steps taken to get to the chair. Patient hesitant to weight bear on right LE 2nd to pain   Stairs            Wheelchair Mobility    Modified Rankin (Stroke Patients Only)       Balance Overall balance assessment: Needs assistance Sitting-balance support: Bilateral upper extremity supported Sitting balance-Leahy Scale: Fair     Standing balance support: Bilateral upper extremity supported Standing balance-Leahy Scale: Poor                               Pertinent Vitals/Pain Pain Assessment: Faces Faces Pain Scale: Hurts little more Pain Location: R groin Pain Descriptors / Indicators: Grimacing;Guarding;Sharp Pain Intervention(s): Monitored during session    Home Living Family/patient expects to be discharged to:: Private residence Living Arrangements: Alone Available Help at Discharge: Family;Available PRN/intermittently Type of Home: House Home Access: Stairs to enter Entrance Stairs-Rails: Right Entrance Stairs-Number of Steps: 2 Home Layout: One level Home Equipment: Walker - 2 wheels;Walker - 4 wheels;Cane - single point Additional Comments: patient does not use shower    Prior Function Level of Independence: Independent with assistive device(s)         Comments: Occasional use of RW in the home and [facility] for community mobility.     Hand Dominance   Dominant Hand: Left  Extremity/Trunk Assessment   Upper Extremity Assessment Upper Extremity Assessment: Overall WFL for tasks assessed;Defer to OT evaluation    Lower Extremity Assessment Lower Extremity Assessment: RLE deficits/detail RLE Deficits / Details: limited active movement of the right hip  RLE: Unable to fully assess due to pain RLE  Sensation: WNL RLE Coordination: WNL    Cervical / Trunk Assessment Cervical / Trunk Assessment: Kyphotic  Communication   Communication: HOH  Cognition Arousal/Alertness: Awake/alert Behavior During Therapy: WFL for tasks assessed/performed Overall Cognitive Status: Within Functional Limits for tasks assessed                                 General Comments: Granddaughter reports she is at her baeline, but is a little groggy.  Not accurate with all details regarding home setup.      General Comments      Exercises     Assessment/Plan    PT Assessment    PT Problem List         PT Treatment Interventions      PT Goals (Current goals can be found in the Care Plan section)  Acute Rehab PT Goals Patient Stated Goal: I want to go home/ Patients grandaughter wants her to be safe  PT Goal Formulation: With patient/family Time For Goal Achievement: 06/28/20 Potential to Achieve Goals: Good    Frequency     Barriers to discharge        Co-evaluation PT/OT/SLP Co-Evaluation/Treatment: Yes Reason for Co-Treatment: Complexity of the patient's impairments (multi-system involvement);Necessary to address cognition/behavior during functional activity;For patient/therapist safety;To address functional/ADL transfers PT goals addressed during session: Mobility/safety with mobility;Proper use of DME;Balance;Strengthening/ROM         AM-PAC PT "6 Clicks" Mobility  Outcome Measure Help needed turning from your back to your side while in a flat bed without using bedrails?: A Lot Help needed moving from lying on your back to sitting on the side of a flat bed without using bedrails?: A Lot Help needed moving to and from a bed to a chair (including a wheelchair)?: A Lot Help needed standing up from a chair using your arms (e.g., wheelchair or bedside chair)?: A Lot Help needed to walk in hospital room?: A Lot Help needed climbing 3-5 steps with a railing? : A Lot 6  Click Score: 12    End of Session Equipment Utilized During Treatment: Gait belt Activity Tolerance: Patient tolerated treatment well          Time: 1275-1700 PT Time Calculation (min) (ACUTE ONLY): 20 min   Charges:   PT Evaluation $PT Eval Moderate Complexity: 1 Mod            Carney Living PT DPT  06/21/2020, 2:20 PM

## 2020-06-21 NOTE — Progress Notes (Signed)
PROGRESS NOTE    Marissa Ryan  QQI:[id number] DOB: September 19, 1924 DOA: 06/20/2020 PCP: Crist Infante, MD   Brief Narrative: 86 year old with past medical history significant for anxiety, diabetes, hypertension, hyperlipidemia, IBS, who presented after a fall at home.  Patient tripped on a rug while walking in her home.  She fell on her right hip, she he get her head on the cabinet.  She presented complaining of right hip pain 7 out of 10.  Evaluation in the ED she was found to have right pubic ramus fracture, right iliac fracture, possible left pubic Ramey fracture. Orthopedic was consulted recommending medical management.   Assessment & Plan:   Principal Problem:   Fracture of multiple pubic rami, right, closed, initial encounter [facility]) Active Problems:   Essential hypertension   Hyperlipidemia   Diabetes ([place])   Anxiety   1-Multiple stable pelvic fractures:  Bilateral pubic Ramey fractures, right ilium fracture which extends into the S1 joint. Evaluated by orthopedic recommended 50% weightbearing on the right lower extremity weightbearing as tolerated on the left lower extremity. PT evaluation, recommending SNF. Scheduled Tylenol for pain.  Discontinue oxycodone due to oversedation, I started low-dose tramadol as needed. Bowel regimen to avoid constipation. Marland Kitchen   2-Acute metabolic encephalopathy, toxic secondary to medications: Patient sleepy and lethargic this morning.  Likely from Percocet and a dose of IV Dilaudid. Discontinue Percocet and IV Dilaudid. We will try low-dose tramadol, PRN/    3-Leukocytosis: Likely reactive.  Resolved  4-Anxiety, sleep disorder: Continue with Remeron Zoloft melatonin Hold gabapentin while lethargic.  5-Diabetes: Sliding scale insulin. 6-Hypertension: continue with losartan 7-Ophthalmic issues: Continue with home eyedrops.    Pressure Injury 10/14/17 Stage I -  Intact skin with non-blanchable redness of a localized area usually over a bony  prominence. (Active)  10/14/17 1700  Location: Coccyx  Location Orientation: Posterior;Medial  Staging: Stage I -  Intact skin with non-blanchable redness of a localized area usually over a bony prominence.  Wound Description (Comments):   Present on Admission: Yes     Estimated body mass index is 21.4 kg/m as calculated from the following:   Height as of 01/29/20: 5\' 2"  (1.575 m).   Weight as of 01/29/20: 53.1 kg.   DVT prophylaxis: Lovenox Code Status: Partial code Family Communication: Discussed with granddaughter Disposition Plan:  Status is: Observation  The patient remains OBS appropriate and will d/c before 2 midnights.  Dispo: The patient is from: Home              Anticipated d/c is to: SNF              Anticipated d/c date is: 2 days              Patient currently is not medically stable to d/c.  Patient is lethargic, probably effect from medications, continue with pain management for pain control        Consultants:   Orthopedic  Procedures:   None  Antimicrobials:    Subjective: Patient sleepy this, and she will open eyes to voice, fall back to sleep.  She is complaining of neck pain  Objective: Vitals:   06/20/20 1945 06/20/20 2130 06/20/20 2300 06/21/20 0408  BP: (!) 145/87 (!) 154/70  (!) 146/53  Pulse: 71 77 76 77  Resp: 18 (!) 21 16 16   Temp:   99.2 F (37.3 C) 98.4 F (36.9 C)  TempSrc:   Oral Oral  SpO2: 98% 100% 99% 96%   No  intake or output data in the 24 hours ending 06/21/20 0812 There were no vitals filed for this visit.  Examination:  General exam: Appears calm and comfortable  Respiratory system: Clear to auscultation. Respiratory effort normal. Cardiovascular system: S1 & S2 heard, RRR. No JVD, murmurs, rubs, gallops or clicks. No pedal edema. Gastrointestinal system: Abdomen is nondistended, soft and nontender. No organomegaly or masses felt. Normal bowel sounds heard. Central nervous system: Sleepy/ . Extremities:  Symmetric 5 x 5 power.    Data Reviewed: I have personally reviewed following labs and imaging studies  CBC: Recent Labs  Lab 06/20/20 1642 06/21/20 0244  WBC 15.0* 9.5  NEUTROABS 12.4*  --   HGB 11.7* 10.9*  HCT 36.1 32.6*  MCV 96.8 94.5  PLT 283 235   Basic Metabolic Panel: Recent Labs  Lab 06/20/20 1642 06/21/20 0244  NA 135 137  K 4.6 4.3  CL 101 100  CO2 25 24  GLUCOSE 198* 191*  BUN 26* 19  CREATININE 0.88 0.80  CALCIUM 9.3 9.1   GFR: CrCl cannot be calculated (Unknown ideal weight.). Liver Function Tests: Recent Labs  Lab 06/20/20 1642  AST 26  ALT 17  ALKPHOS 43  BILITOT 0.4  PROT 6.4*  ALBUMIN 3.6   No results for input(s): LIPASE, AMYLASE in the last 168 hours. No results for input(s): AMMONIA in the last 168 hours. Coagulation Profile: No results for input(s): INR, PROTIME in the last 168 hours. Cardiac Enzymes: No results for input(s): CKTOTAL, CKMB, CKMBINDEX, TROPONINI in the last 168 hours. BNP (last 3 results) No results for input(s): PROBNP in the last 8760 hours. HbA1C: Recent Labs    06/20/20 1642  HGBA1C 6.6*   CBG: Recent Labs  Lab 06/20/20 2218 06/21/20 0408  GLUCAP 97 150*   Lipid Profile: No results for input(s): CHOL, HDL, LDLCALC, TRIG, CHOLHDL, LDLDIRECT in the last 72 hours. Thyroid Function Tests: No results for input(s): TSH, T4TOTAL, FREET4, T3FREE, THYROIDAB in the last 72 hours. Anemia Panel: No results for input(s): VITAMINB12, FOLATE, FERRITIN, TIBC, IRON, RETICCTPCT in the last 72 hours. Sepsis Labs: No results for input(s): PROCALCITON, LATICACIDVEN in the last 168 hours.  Recent Results (from the past 240 hour(s))  Resp Panel by RT-PCR (Flu A&B, Covid) Nasopharyngeal Swab     Status: None   Collection Time: 06/20/20  4:42 PM   Specimen: Nasopharyngeal Swab; Nasopharyngeal(NP) swabs in vial transport medium  Result Value Ref Range Status   SARS Coronavirus 2 by RT PCR NEGATIVE NEGATIVE Final     Comment: (NOTE) SARS-CoV-2 target nucleic acids are NOT DETECTED.  The SARS-CoV-2 RNA is generally detectable in upper respiratory specimens during the acute phase of infection. The lowest concentration of SARS-CoV-2 viral copies this assay can detect is 138 copies/mL. A negative result does not preclude SARS-Cov-2 infection and should not be used as the sole basis for treatment or other patient management decisions. A negative result may occur with  improper specimen collection/handling, submission of specimen other than nasopharyngeal swab, presence of viral mutation(s) within the areas targeted by this assay, and inadequate number of viral copies(<138 copies/mL). A negative result must be combined with clinical observations, patient history, and epidemiological information. The expected result is Negative.  Fact Sheet for Patients:  EntrepreneurPulse.com.au  Fact Sheet for Healthcare Providers:  IncredibleEmployment.be  This test is no t yet approved or cleared by the [place] FDA and  has been authorized for detection and/or diagnosis of SARS-CoV-2 by FDA under an Emergency  Use Authorization (EUA). This EUA will remain  in effect (meaning this test can be used) for the duration of the COVID-19 declaration under Section 564(b)(1) of the Act, 21 U.S.C.section 360bbb-3(b)(1), unless the authorization is terminated  or revoked sooner.       Influenza A by PCR NEGATIVE NEGATIVE Final   Influenza B by PCR NEGATIVE NEGATIVE Final    Comment: (NOTE) The Xpert Xpress SARS-CoV-2/FLU/RSV plus assay is intended as an aid in the diagnosis of influenza from Nasopharyngeal swab specimens and should not be used as a sole basis for treatment. Nasal washings and aspirates are unacceptable for Xpert Xpress SARS-CoV-2/FLU/RSV testing.  Fact Sheet for Patients: EntrepreneurPulse.com.au  Fact Sheet for Healthcare  Providers: IncredibleEmployment.be  This test is not yet approved or cleared by the [place] FDA and has been authorized for detection and/or diagnosis of SARS-CoV-2 by FDA under an Emergency Use Authorization (EUA). This EUA will remain in effect (meaning this test can be used) for the duration of the COVID-19 declaration under Section 564(b)(1) of the Act, 21 U.S.C. section 360bbb-3(b)(1), unless the authorization is terminated or revoked.  Performed at [facility], [place] [address], [place], [place] [address]          Radiology Studies: CT Head Wo Contrast  Result Date: 06/20/2020 CLINICAL DATA:  Tripped and fell EXAM: CT HEAD WITHOUT CONTRAST TECHNIQUE: Contiguous axial images were obtained from the base of the skull through the vertex without intravenous contrast. COMPARISON:  01/29/2020 FINDINGS: Brain: No acute infarct or hemorrhage. Lateral ventricles and midline structures are stable. No acute extra-axial fluid collections. No mass effect. Vascular: No hyperdense vessel or unexpected calcification. Skull: Normal. Negative for fracture or focal lesion. Sinuses/Orbits: No acute finding. Other: None. IMPRESSION: 1. Stable head CT, no acute process. Electronically Signed   By: Randa Ngo M.D.   On: 06/20/2020 16:59   CT Cervical Spine Wo Contrast  Result Date: 06/20/2020 CLINICAL DATA:  Tripped and fell EXAM: CT CERVICAL SPINE WITHOUT CONTRAST TECHNIQUE: Multidetector CT imaging of the cervical spine was performed without intravenous contrast. Multiplanar CT image reconstructions were also generated. COMPARISON:  01/29/2020 FINDINGS: Alignment: Alignment is stable, with minimal anterolisthesis of C3 on C4 and C4 on C5. This is likely due to extensive facet hypertrophic changes. Skull base and vertebrae: No acute fracture. No primary bone lesion or focal pathologic process. Chronic mild compression deformities of T1 and T2 unchanged. Soft tissues and  spinal canal: No prevertebral fluid or swelling. No visible canal hematoma. Disc levels: Marked degenerative changes at C1-C2 with significant pannus, stable. Stable multilevel spondylosis greatest at C5-6 and C6-7. Diffuse facet hypertrophy unchanged. Upper chest: Airway is patent. Biapical pleural and parenchymal scarring unchanged. Other: Reconstructed images demonstrate no additional findings. IMPRESSION: 1. No acute cervical spine fracture. 2. Stable multilevel degenerative changes. Electronically Signed   By: Randa Ngo M.D.   On: 06/20/2020 17:04   CT PELVIS WO CONTRAST  Result Date: 06/20/2020 CLINICAL DATA:  Pelvic fractures. EXAM: CT PELVIS WITHOUT CONTRAST TECHNIQUE: Multidetector CT imaging of the pelvis was performed following the standard protocol without intravenous contrast. COMPARISON:  Hip x-ray June 20, 2020. CT of the abdomen and pelvis June 20, 2018 FINDINGS: Urinary Tract: The bladder is mildly distended but otherwise normal. Distal ureters are unremarkable. The lower pole the right kidney is unremarkable. Bowel: Colonic diverticulosis is seen without diverticulitis. The bowel is otherwise unremarkable. Vascular/Lymphatic: Calcified atherosclerosis is seen in the distal abdominal aorta, iliac vessels, and femoral  vessels. Reproductive:  No mass or other significant abnormality Other:  None. Musculoskeletal: There is a mild displaced fracture of the right inferior pubic ramus. There is a displaced comminuted fracture of the right superior pubic ramus. No extension into the acetabulum identified. There is a fracture through the posterior right iliac bone best seen on coronal image 90 and 91. This fracture is also seen on axial images 14 and 15. There appears to be subtle extension into the right SI joint as seen on coronal image 90. No widening of the SI joint identified. The fracture lines also extend into the base of the right iliac crest as seen on sagittal image 55 and coronal  image 70. IMPRESSION: 1. Mildly displaced comminuted right inferior pubic ramus fracture. 2. Displaced comminuted fracture of the right superior pubic ramus. 3. Fractures in the right iliac bone. Posterior fracture lines appear to extend into the SI joint although this is subtle and there is no widening of the SI joint. The fractures also extend into the base of the right iliac crest as above. 4. There may be a subtle fracture through the superior most aspect of the left pubic ramus is seen on axial image 41. Electronically Signed   By: Dorise Bullion III M.D   On: 06/20/2020 17:58   DG Hip Unilat With Pelvis 2-3 Views Right  Result Date: 06/20/2020 CLINICAL DATA:  86 year old female with fall. Right hip pain. EXAM: DG HIP (WITH OR WITHOUT PELVIS) 2-3V RIGHT COMPARISON:  CT abdomen pelvis dated 07/17/2018. 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 The arthroplasty components appear intact and in anatomic alignment. There is a minimally displaced fracture of the right superior and inferior pubic rami. No other acute fracture. There is no dislocation. The bones are osteopenic. Vascular calcifications noted. The soft tissues are otherwise unremarkable. IMPRESSION: Minimally displaced fractures of the right superior and inferior pubic rami. Electronically Signed   By: Anner Crete M.D.   On: 06/20/2020 16:36        Scheduled Meds: . acetaminophen  500 mg Oral TID  . enoxaparin (LOVENOX) injection  40 mg Subcutaneous Q24H  . gabapentin  300 mg Oral BID  . insulin aspart  0-9 Units Subcutaneous TID WC  . losartan  100 mg Oral QHS  . melatonin  3 mg Oral QHS  . mirtazapine  15 mg Oral QHS  . multivitamin  1 tablet Oral Q breakfast  . sertraline  50 mg Oral QHS   Continuous Infusions:   LOS: 0 days    Time spent: 35 minutes.     Elmarie Shiley, MD [facility]   If 7PM-7AM, please contact night-coverage [email or web address]  06/21/2020, 8:12 AM

## 2020-06-22 DIAGNOSIS — S32591A Other specified fracture of right pubis, initial encounter for closed fracture: Secondary | ICD-10-CM | POA: Diagnosis not present

## 2020-06-22 LAB — GLUCOSE, CAPILLARY
Glucose-Capillary: 118 mg/dL — ABNORMAL HIGH (ref 70–99)
Glucose-Capillary: 161 mg/dL — ABNORMAL HIGH (ref 70–99)
Glucose-Capillary: 115 mg/dL — ABNORMAL HIGH (ref 70–99)
Glucose-Capillary: 113 mg/dL — ABNORMAL HIGH (ref 70–99)

## 2020-06-22 MED ORDER — POLYETHYLENE GLYCOL 3350 17 G PO PACK
17.0000 g | PACK | Freq: Two times a day (BID) | ORAL | Status: DC
  Administered 2020-06-22 – 2020-06-23 (×3): 17 g via ORAL
  Filled 2020-06-22 (×5): qty 1

## 2020-06-22 NOTE — Progress Notes (Signed)
PROGRESS NOTE    Marissa Ryan  WNI:[id number] DOB: 1925/03/18 DOA: 06/20/2020 PCP: Crist Infante, MD   Brief Narrative: 86 year old with past medical history significant for anxiety, diabetes, hypertension, hyperlipidemia, IBS, who presented after a fall at home.  Patient tripped on a rug while walking in her home.  She fell on her right hip, she he get her head on the cabinet.  She presented complaining of right hip pain 7 out of 10.  Evaluation in the ED she was found to have right pubic ramus fracture, right iliac fracture, possible left pubic Ramey fracture. Orthopedic was consulted recommending medical management.   Assessment & Plan:   Principal Problem:   Fracture of multiple pubic rami, right, closed, initial encounter [facility]) Active Problems:   Essential hypertension   Hyperlipidemia   Diabetes ([place])   Anxiety   1-Multiple stable pelvic fractures:  Bilateral pubic Ramey fractures, right ilium fracture which extends into the S1 joint. Evaluated by orthopedic recommended 50% weightbearing on the right lower extremity weightbearing as tolerated on the left lower extremity. PT evaluation, recommending SNF. Scheduled Tylenol for pain.  Discontinue oxycodone due to oversedation, I started low-dose tramadol as needed. Bowel regimen to avoid constipation. . Will schedule miralax.   2-Acute metabolic encephalopathy, toxic secondary to medications: Patient sleepy and lethargic the morning of 12/04. Likely from Percocet and a dose of IV Dilaudid. Discontinue Percocet and IV Dilaudid. low-dose tramadol, PRN/  She is more alert and conversant this am.    3-Leukocytosis: Likely reactive.  Resolved  4-Anxiety, sleep disorder: Continue with Remeron Zoloft melatonin Hold gabapentin while lethargic.  5-Diabetes: Sliding scale insulin. 6-Hypertension: Continue with losartan 7-Ophthalmic issues: Continue with home eyedrops.    Pressure Injury 10/14/17 Stage I -  Intact skin with  non-blanchable redness of a localized area usually over a bony prominence. (Active)  10/14/17 1700  Location: Coccyx  Location Orientation: Posterior;Medial  Staging: Stage I -  Intact skin with non-blanchable redness of a localized area usually over a bony prominence.  Wound Description (Comments):   Present on Admission: Yes     Estimated body mass index is 21.4 kg/m as calculated from the following:   Height as of 01/29/20: 5\' 2"  (1.575 m).   Weight as of 01/29/20: 53.1 kg.   DVT prophylaxis: Lovenox Code Status: Partial code Family Communication: Discussed with granddaughter 12/04 Disposition Plan:  Status is: Observation  The patient remains OBS appropriate and will d/c before 2 midnights.  Dispo: The patient is from: Home              Anticipated d/c is to: SNF              Anticipated d/c date is: 2 days              Patient currently is not medically stable to d/c.  Patient is lethargic, probably effect from medications, continue with pain management for pain control        Consultants:   Orthopedic  Procedures:   None  Antimicrobials:    Subjective: She is alert today. No Bowel movement yet. She is worry about having Bowel movement in the bed and having urine catheter  in place.  She agrees to go to Rehab form the hospital.   Objective: Vitals:   06/21/20 2015 06/22/20 0300 06/22/20 0738 06/22/20 1207  BP: (!) 106/53 116/67 (!) 179/68 (!) 158/56  Pulse: 73 71 78 79  Resp: 16 16 18 16   Temp:  98.7 F (37.1 C) 98.3 F (36.8 C) 97.8 F (36.6 C) 98.1 F (36.7 C)  TempSrc: Oral Oral Oral   SpO2: 93% 94% 100% 96%    Intake/Output Summary (Last 24 hours) at 06/22/2020 1333 Last data filed at 06/22/2020 0900 Gross per 24 hour  Intake 978.3 ml  Output 900 ml  Net 78.3 ml   There were no vitals filed for this visit.  Examination:  General exam: NAD Respiratory system: CTA Cardiovascular system: S 1, S 2 RRR Gastrointestinal system: BS present,  soft, nt Central nervous system: Alert, follows command Extremities: no edema    Data Reviewed: I have personally reviewed following labs and imaging studies  CBC: Recent Labs  Lab 06/20/20 1642 06/21/20 0244  WBC 15.0* 9.5  NEUTROABS 12.4*  --   HGB 11.7* 10.9*  HCT 36.1 32.6*  MCV 96.8 94.5  PLT 283 829   Basic Metabolic Panel: Recent Labs  Lab 06/20/20 1642 06/21/20 0244  NA 135 137  K 4.6 4.3  CL 101 100  CO2 25 24  GLUCOSE 198* 191*  BUN 26* 19  CREATININE 0.88 0.80  CALCIUM 9.3 9.1   GFR: CrCl cannot be calculated (Unknown ideal weight.). Liver Function Tests: Recent Labs  Lab 06/20/20 1642  AST 26  ALT 17  ALKPHOS 43  BILITOT 0.4  PROT 6.4*  ALBUMIN 3.6   No results for input(s): LIPASE, AMYLASE in the last 168 hours. No results for input(s): AMMONIA in the last 168 hours. Coagulation Profile: No results for input(s): INR, PROTIME in the last 168 hours. Cardiac Enzymes: No results for input(s): CKTOTAL, CKMB, CKMBINDEX, TROPONINI in the last 168 hours. BNP (last 3 results) No results for input(s): PROBNP in the last 8760 hours. HbA1C: Recent Labs    06/20/20 1642  HGBA1C 6.6*   CBG: Recent Labs  Lab 06/21/20 1337 06/21/20 1656 06/21/20 2132 06/22/20 0637 06/22/20 1205  GLUCAP 233* 144* 152* 118* 161*   Lipid Profile: No results for input(s): CHOL, HDL, LDLCALC, TRIG, CHOLHDL, LDLDIRECT in the last 72 hours. Thyroid Function Tests: No results for input(s): TSH, T4TOTAL, FREET4, T3FREE, THYROIDAB in the last 72 hours. Anemia Panel: No results for input(s): VITAMINB12, FOLATE, FERRITIN, TIBC, IRON, RETICCTPCT in the last 72 hours. Sepsis Labs: No results for input(s): PROCALCITON, LATICACIDVEN in the last 168 hours.  Recent Results (from the past 240 hour(s))  Resp Panel by RT-PCR (Flu A&B, Covid) Nasopharyngeal Swab     Status: None   Collection Time: 06/20/20  4:42 PM   Specimen: Nasopharyngeal Swab; Nasopharyngeal(NP) swabs in  vial transport medium  Result Value Ref Range Status   SARS Coronavirus 2 by RT PCR NEGATIVE NEGATIVE Final    Comment: (NOTE) SARS-CoV-2 target nucleic acids are NOT DETECTED.  The SARS-CoV-2 RNA is generally detectable in upper respiratory specimens during the acute phase of infection. The lowest concentration of SARS-CoV-2 viral copies this assay can detect is 138 copies/mL. A negative result does not preclude SARS-Cov-2 infection and should not be used as the sole basis for treatment or other patient management decisions. A negative result may occur with  improper specimen collection/handling, submission of specimen other than nasopharyngeal swab, presence of viral mutation(s) within the areas targeted by this assay, and inadequate number of viral copies(<138 copies/mL). A negative result must be combined with clinical observations, patient history, and epidemiological information. The expected result is Negative.  Fact Sheet for Patients:  EntrepreneurPulse.com.au  Fact Sheet for Healthcare Providers:  IncredibleEmployment.be  This  test is no t yet approved or cleared by the [place] and  has been authorized for detection and/or diagnosis of SARS-CoV-2 by FDA under an Emergency Use Authorization (EUA). This EUA will remain  in effect (meaning this test can be used) for the duration of the COVID-19 declaration under Section 564(b)(1) of the Act, 21 U.S.C.section 360bbb-3(b)(1), unless the authorization is terminated  or revoked sooner.       Influenza A by PCR NEGATIVE NEGATIVE Final   Influenza B by PCR NEGATIVE NEGATIVE Final    Comment: (NOTE) The Xpert Xpress SARS-CoV-2/FLU/RSV plus assay is intended as an aid in the diagnosis of influenza from Nasopharyngeal swab specimens and should not be used as a sole basis for treatment. Nasal washings and aspirates are unacceptable for Xpert Xpress SARS-CoV-2/FLU/RSV testing.  Fact  Sheet for Patients: EntrepreneurPulse.com.au  Fact Sheet for Healthcare Providers: IncredibleEmployment.be  This test is not yet approved or cleared by the [place] FDA and has been authorized for detection and/or diagnosis of SARS-CoV-2 by FDA under an Emergency Use Authorization (EUA). This EUA will remain in effect (meaning this test can be used) for the duration of the COVID-19 declaration under Section 564(b)(1) of the Act, 21 U.S.C. section 360bbb-3(b)(1), unless the authorization is terminated or revoked.  Performed at [facility], [place] [address], [place], [place] [address]          Radiology Studies: CT Head Wo Contrast  Result Date: 06/20/2020 CLINICAL DATA:  Tripped and fell EXAM: CT HEAD WITHOUT CONTRAST TECHNIQUE: Contiguous axial images were obtained from the base of the skull through the vertex without intravenous contrast. COMPARISON:  01/29/2020 FINDINGS: Brain: No acute infarct or hemorrhage. Lateral ventricles and midline structures are stable. No acute extra-axial fluid collections. No mass effect. Vascular: No hyperdense vessel or unexpected calcification. Skull: Normal. Negative for fracture or focal lesion. Sinuses/Orbits: No acute finding. Other: None. IMPRESSION: 1. Stable head CT, no acute process. Electronically Signed   By: Randa Ngo M.D.   On: 06/20/2020 16:59   CT Cervical Spine Wo Contrast  Result Date: 06/20/2020 CLINICAL DATA:  Tripped and fell EXAM: CT CERVICAL SPINE WITHOUT CONTRAST TECHNIQUE: Multidetector CT imaging of the cervical spine was performed without intravenous contrast. Multiplanar CT image reconstructions were also generated. COMPARISON:  01/29/2020 FINDINGS: Alignment: Alignment is stable, with minimal anterolisthesis of C3 on C4 and C4 on C5. This is likely due to extensive facet hypertrophic changes. Skull base and vertebrae: No acute fracture. No primary bone lesion or focal  pathologic process. Chronic mild compression deformities of T1 and T2 unchanged. Soft tissues and spinal canal: No prevertebral fluid or swelling. No visible canal hematoma. Disc levels: Marked degenerative changes at C1-C2 with significant pannus, stable. Stable multilevel spondylosis greatest at C5-6 and C6-7. Diffuse facet hypertrophy unchanged. Upper chest: Airway is patent. Biapical pleural and parenchymal scarring unchanged. Other: Reconstructed images demonstrate no additional findings. IMPRESSION: 1. No acute cervical spine fracture. 2. Stable multilevel degenerative changes. Electronically Signed   By: Randa Ngo M.D.   On: 06/20/2020 17:04   CT PELVIS WO CONTRAST  Result Date: 06/20/2020 CLINICAL DATA:  Pelvic fractures. EXAM: CT PELVIS WITHOUT CONTRAST TECHNIQUE: Multidetector CT imaging of the pelvis was performed following the standard protocol without intravenous contrast. COMPARISON:  Hip x-ray June 20, 2020. CT of the abdomen and pelvis June 20, 2018 FINDINGS: Urinary Tract: The bladder is mildly distended but otherwise normal. Distal ureters are unremarkable. The lower pole the right  kidney is unremarkable. Bowel: Colonic diverticulosis is seen without diverticulitis. The bowel is otherwise unremarkable. Vascular/Lymphatic: Calcified atherosclerosis is seen in the distal abdominal aorta, iliac vessels, and femoral vessels. Reproductive:  No mass or other significant abnormality Other:  None. Musculoskeletal: There is a mild displaced fracture of the right inferior pubic ramus. There is a displaced comminuted fracture of the right superior pubic ramus. No extension into the acetabulum identified. There is a fracture through the posterior right iliac bone best seen on coronal image 90 and 91. This fracture is also seen on axial images 14 and 15. There appears to be subtle extension into the right SI joint as seen on coronal image 90. No widening of the SI joint identified. The fracture  lines also extend into the base of the right iliac crest as seen on sagittal image 55 and coronal image 70. IMPRESSION: 1. Mildly displaced comminuted right inferior pubic ramus fracture. 2. Displaced comminuted fracture of the right superior pubic ramus. 3. Fractures in the right iliac bone. Posterior fracture lines appear to extend into the SI joint although this is subtle and there is no widening of the SI joint. The fractures also extend into the base of the right iliac crest as above. 4. There may be a subtle fracture through the superior most aspect of the left pubic ramus is seen on axial image 41. Electronically Signed   By: Dorise Bullion III M.D   On: 06/20/2020 17:58   DG Hip Unilat With Pelvis 2-3 Views Right  Result Date: 06/20/2020 CLINICAL DATA:  86 year old female with fall. Right hip pain. EXAM: DG HIP (WITH OR WITHOUT PELVIS) 2-3V RIGHT COMPARISON:  CT abdomen pelvis dated 07/17/2018. 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 The arthroplasty components appear intact and in anatomic alignment. There is a minimally displaced fracture of the right superior and inferior pubic rami. No other acute fracture. There is no dislocation. The bones are osteopenic. Vascular calcifications noted. The soft tissues are otherwise unremarkable. IMPRESSION: Minimally displaced fractures of the right superior and inferior pubic rami. Electronically Signed   By: Anner Crete M.D.   On: 06/20/2020 16:36        Scheduled Meds: . acetaminophen  500 mg Oral TID  . enoxaparin (LOVENOX) injection  40 mg Subcutaneous Q24H  . gabapentin  300 mg Oral BID  . insulin aspart  0-9 Units Subcutaneous TID WC  . losartan  100 mg Oral QHS  . melatonin  3 mg Oral QHS  . mirtazapine  15 mg Oral QHS  . multivitamin  1 tablet Oral Q breakfast  . senna-docusate  1 tablet Oral BID  . sertraline  50 mg Oral QHS   Continuous Infusions:   LOS: 1 day    Time spent: 35 minutes.     Elmarie Shiley,  MD [facility]   If 7PM-7AM, please contact night-coverage [email or web address]  06/22/2020, 1:33 PM

## 2020-06-22 NOTE — Plan of Care (Signed)

## 2020-06-22 NOTE — Plan of Care (Signed)

## 2020-06-23 ENCOUNTER — Other Ambulatory Visit: Payer: Self-pay | Admitting: *Deleted

## 2020-06-23 DIAGNOSIS — S32591A Other specified fracture of right pubis, initial encounter for closed fracture: Secondary | ICD-10-CM | POA: Diagnosis not present

## 2020-06-23 LAB — GLUCOSE, CAPILLARY
Glucose-Capillary: 129 mg/dL — ABNORMAL HIGH (ref 70–99)
Glucose-Capillary: 144 mg/dL — ABNORMAL HIGH (ref 70–99)
Glucose-Capillary: 197 mg/dL — ABNORMAL HIGH (ref 70–99)
Glucose-Capillary: 182 mg/dL — ABNORMAL HIGH (ref 70–99)

## 2020-06-23 LAB — URINALYSIS, ROUTINE W REFLEX MICROSCOPIC
Bilirubin Urine: NEGATIVE
Glucose, UA: NEGATIVE mg/dL
Ketones, ur: NEGATIVE mg/dL
Nitrite: NEGATIVE
Protein, ur: 30 mg/dL — AB
RBC / HPF: >50 RBC/hpf — ABNORMAL HIGH (ref 0–5)
Specific Gravity, Urine: 1.015 (ref 1.005–1.030)
WBC, UA: >50 WBC/hpf — ABNORMAL HIGH (ref 0–5)
pH: 5 (ref 5.0–8.0)

## 2020-06-23 LAB — BASIC METABOLIC PANEL
Anion gap: 10 (ref 5–15)
BUN: 16 mg/dL (ref 8–23)
CO2: 25 mmol/L (ref 22–32)
Calcium: 9 mg/dL (ref 8.9–10.3)
Chloride: 102 mmol/L (ref 98–111)
Creatinine, Ser: 0.74 mg/dL (ref 0.44–1.00)
GFR, Estimated: >60 mL/min (ref >60)
Glucose, Bld: 120 mg/dL — ABNORMAL HIGH (ref 70–99)
Potassium: 4.2 mmol/L (ref 3.5–5.1)
Sodium: 137 mmol/L (ref 135–145)

## 2020-06-23 MED ORDER — ACETAMINOPHEN 500 MG PO TABS
500.0000 mg | ORAL_TABLET | Freq: Three times a day (TID) | ORAL | 0 refills | Status: DC
Start: 2020-06-23 — End: 2021-10-16

## 2020-06-23 MED ORDER — SENNOSIDES-DOCUSATE SODIUM 8.6-50 MG PO TABS
1.0000 | ORAL_TABLET | Freq: Two times a day (BID) | ORAL | 0 refills | Status: DC
Start: 2020-06-23 — End: 2021-10-16

## 2020-06-23 MED ORDER — ALPRAZOLAM 0.25 MG PO TABS: 0.2500 mg | ORAL_TABLET | Freq: Every evening | ORAL | 0 refills | Status: DC | PRN

## 2020-06-23 MED ORDER — SODIUM CHLORIDE 0.9 % IV SOLN: INTRAVENOUS | Status: DC

## 2020-06-23 MED ORDER — TRAMADOL HCL 50 MG PO TABS: 25.0000 mg | ORAL_TABLET | Freq: Four times a day (QID) | ORAL | 0 refills | Status: DC | PRN

## 2020-06-23 MED ORDER — POLYETHYLENE GLYCOL 3350 17 G PO PACK
17.0000 g | PACK | Freq: Two times a day (BID) | ORAL | 0 refills | Status: DC
Start: 2020-06-23 — End: 2021-10-11

## 2020-06-23 MED ORDER — SODIUM CHLORIDE 0.9 % IV SOLN
1.0000 g | INTRAVENOUS | Status: DC
  Administered 2020-06-23: 1 g via INTRAVENOUS
  Filled 2020-06-23: qty 10

## 2020-06-23 NOTE — Progress Notes (Signed)
Orthopedics Progress Note  Subjective: Patient denies any hip pain or pelvic pain complaints but reports urinary urgency  Objective:  Vitals:   06/23/20 0311 06/23/20 0758  BP: (!) 153/66 (!) 164/65  Pulse: 75 72  Resp: 18 16  Temp: 98.2 F (36.8 C) 98 F (36.7 C)  SpO2: 96% 97%    General: Awake and alert  Musculoskeletal: No pain with AROM of both hips and knees and ankles, no calf swelling, distally NVI Neurovascularly intact  Lab Results  Component Value Date   WBC 9.5 06/21/2020   HGB 10.9 (L) 06/21/2020   HCT 32.6 (L) 06/21/2020   MCV 94.5 06/21/2020   PLT 241 06/21/2020       Component Value Date/Time   NA 137 06/23/2020 0827   K 4.2 06/23/2020 0827   CL 102 06/23/2020 0827   CO2 25 06/23/2020 0827   GLUCOSE 120 (H) 06/23/2020 0827   BUN 16 06/23/2020 0827   CREATININE 0.74 06/23/2020 0827   CALCIUM 9.0 06/23/2020 0827   GFRNONAA >60 06/23/2020 0827   GFRAA >60 01/29/2020 1315    Lab Results  Component Value Date   INR 0.97 10/02/2014   INR 0.90 02/12/2014    Assessment/Plan: Patient with ground level fall and stable pelvic fracture not requiring surgery.  Discharge planning in progress with SNF placement imminent.  Due to patient complaint of urinary urgency despite bladder scan with only 120 cc, will order UA and urine culture.  WBAT on both legs, continue mobilization Follow up as needed with ortho.    Doran Heater. Veverly Fells, MD 06/23/2020 2:20 PM

## 2020-06-23 NOTE — Progress Notes (Signed)
Physical Therapy Treatment Patient Details Name: Marissa Ryan MRN: [id number] DOB: 17-Apr-1925 Today's Date: 06/23/2020    History of Present Illness Marissa Ryan is a 86 y.o. female with medical history significant of anxiety, diabetes, hypertension, hyperlipidemia, IBS, OA, ophthalmic issues who presents after a fall at home.  Stable pelvic fractures including bilateral pubic rami fractures and a right ileum fracture which extends into the SI joint.    PT Comments    PT arrived to work with pt and pt was sitting in recliner with her RLE awkwardly holding RLE abducted and extended.  Convinced pt to let PT reposition the leg, first freeing the leg from the awkward posture.  Pt is fearful to move RLE, but also having symptoms of UTI.  Pt was set up for UA per note in chart, and will continue to work on standing and walking as her stay permits.  Talked with granddaughter about the pt getting more aggressive therapy in her SNF placement, and will be potentially seen today if dc is done early enough.  Follow for acute goals of PT and focus on standing balance and safety awareness.  Family supportive of all rehab goals.   Follow Up Recommendations  SNF     Equipment Recommendations  None recommended by PT    Recommendations for Other Services       Precautions / Restrictions Precautions Precautions: Fall Precaution Comments: monitor for WB limitation on R LE Restrictions LUE Weight Bearing: Weight bearing as tolerated RLE Weight Bearing: Partial weight bearing RLE Partial Weight Bearing Percentage or Pounds: 50%    Mobility  Bed Mobility               General bed mobility comments: up inchair  Transfers                 General transfer comment: declined  Ambulation/Gait                 Stairs             Wheelchair Mobility    Modified Rankin (Stroke Patients Only)       Balance     Sitting balance-Leahy Scale: Fair                                       Cognition Arousal/Alertness: Awake/alert Behavior During Therapy: Impulsive;Agitated Overall Cognitive Status: Impaired/Different from baseline Area of Impairment: Awareness;Problem solving;Safety/judgement;Following commands;Memory;Attention;Orientation                 Orientation Level: Time;Situation Current Attention Level: Selective Memory: Decreased recall of precautions;Decreased short-term memory Following Commands: Follows one step commands inconsistently;Follows one step commands with increased time Safety/Judgement: Decreased awareness of deficits;Decreased awareness of safety Awareness: Intellectual Problem Solving: Slow processing;Requires verbal cues;Requires tactile cues General Comments: pt is talking about needing to void and daughter reports she just has.       Exercises      General Comments General comments (skin integrity, edema, etc.): pt is confused and talking about urinary urgency, mild agitation about the lack of ability to void, but already had per granddaughter.  Pt is repositioned on chair as she had pulled over RLE onto walker and was in some discomfort.  Better with RLE supported, will reattempt more mobility of pt can allow      Pertinent Vitals/Pain Pain Assessment: Faces Faces Pain Scale: Hurts little more Pain  Location: R groin Pain Descriptors / Indicators: Guarding;Heaviness Pain Intervention(s): Monitored during session;Repositioned    Home Living                      Prior Function            PT Goals (current goals can now be found in the care plan section) Acute Rehab PT Goals Patient Stated Goal: to void in chair Progress towards PT goals: Not progressing toward goals - comment    Frequency    Min 3X/week      PT Plan Current plan remains appropriate    Co-evaluation              AM-PAC PT "6 Clicks" Mobility   Outcome Measure  Help needed turning from your back to  your side while in a flat bed without using bedrails?: A Lot Help needed moving from lying on your back to sitting on the side of a flat bed without using bedrails?: A Lot Help needed moving to and from a bed to a chair (including a wheelchair)?: A Lot Help needed standing up from a chair using your arms (e.g., wheelchair or bedside chair)?: A Lot Help needed to walk in hospital room?: A Lot Help needed climbing 3-5 steps with a railing? : A Lot 6 Click Score: 12    End of Session Equipment Utilized During Treatment: Oxygen Activity Tolerance: Patient limited by fatigue;Treatment limited secondary to medical complications (Comment) Patient left: in chair;with call bell/phone within reach;with chair alarm set;with family/visitor present Nurse Communication: Mobility status PT Visit Diagnosis: Unsteadiness on feet (R26.81);Muscle weakness (generalized) (M62.81);Difficulty in walking, not elsewhere classified (R26.2)     Time: 1451-1500 PT Time Calculation (min) (ACUTE ONLY): 9 min  Charges:  $Therapeutic Activity: 8-22 mins       Ramond Dial 06/23/2020, 3:59 PM  Mee Hives, PT MS Acute Rehab Dept. Number: [place] and [place]

## 2020-06-23 NOTE — TOC Progression Note (Addendum)
Transition of Care [facility] - Progression Note    Patient Details  Name: Marissa Ryan MRN: [id number] Date of Birth: [birth year]  Transition of Care [facility]) CM/SW [place], RN Phone Number: 06/23/2020, 12:28 PM  Clinical Narrative:    Case management met with the patient and grand-daughter at the bedside to offer choice regarding [place] facility.  The grand daughter was given the Medicare choice regarding the SNF and the granddaughter is deciding the facility and would like to see if [facility] an available bed for the patient.  I called and left a message with Lyna Poser, CM at [facility] to check on bed availability.  The patient is fully vaccinated.  Will continue to follow the patient for discharge to SNF facility.  I'm waiting to hear back from [facility] for possible admission.  Dr. Frederic Jericho was made aware that the patient may not discharge to the facility until tomorrow.  06/23/2020 - The daughter chose 1. [facility], 2. [facility] and 3. [facility].  I was unable to reach both [facility] and [facility] today regarding bed availability.  The patient is waiting on urinalysis results to return per the patient's daughter.  The daughter asked about [facility] in [place] called and spoke with Seth Bake, CM at [facility] and she has an available bed opening in the morning and I'm to follow up with [facility] in the morning regarding possible SNF transfer.  Will continue to follow the patient for discharge to the SNF in the morning.   Expected Discharge Plan: [place] Barriers to Discharge: No Barriers Identified  Expected Discharge Plan and Services Expected Discharge Plan: [place] In-house Referral: Clinical Social Work     Living arrangements for the past 2 months: Single Family Home Expected Discharge Date: 06/23/20                                     Social Determinants of Health (SDOH) Interventions     Readmission Risk Interventions No flowsheet data found.

## 2020-06-23 NOTE — Progress Notes (Signed)
Oxygen dropped 86-88% on RA. Continue o2 at 1lpl. SAT remains 91-94%.

## 2020-06-23 NOTE — Plan of Care (Signed)

## 2020-06-23 NOTE — Discharge Summary (Addendum)
Physician Discharge Summary  Marissa Ryan TKW:[id number] DOB: Aug 30, 1924 DOA: 06/20/2020  PCP: Crist Infante, MD  Admit date: 06/20/2020 Discharge date: 06/23/2020  Admitted From: Home  Disposition:  SNF  Recommendations for Outpatient Follow-up:  1. Follow up with PCP in 1-2 weeks 2. Please obtain BMP/CBC in one week 3. Follow up with Ortho in 2 weeks 4. WB 50 % on right WBAT on left   Discharge Condition: Stable.  CODE STATUS: Partial code in the hospital.  Diet recommendation: Heart Healthy   Brief/Interim Summary: 86 year old with past medical history significant for anxiety, diabetes, hypertension, hyperlipidemia, IBS, who presented after a fall at home.  Patient tripped on a rug while walking in her home.  She fell on her right hip, she he get her head on the cabinet.  She presented complaining of right hip pain 7 out of 10.  Evaluation in the ED she was found to have right pubic ramus fracture, right iliac fracture, possible left pubic Ramey fracture. Orthopedic was consulted recommending medical management.   1-Multiple stable pelvic fractures:  Bilateral pubic Ramey fractures, right ilium fracture which extends into the S1 joint. Evaluated by orthopedic recommended 50% weightbearing on the right lower extremity weightbearing as tolerated on the left lower extremity. PT evaluation, recommending SNF. Scheduled Tylenol for pain.  Discontinue oxycodone due to oversedation, I started low-dose tramadol as needed. Bowel regimen to avoid constipation. On schedule Miralax.   2-Acute metabolic encephalopathy, toxic secondary to medications: Patient sleepy and lethargic the morning of 12/04. Likely from Percocet and a dose of IV Dilaudid. Discontinue Percocet and IV Dilaudid. low-dose tramadol, PRN/  Patient is more alert, she is hard of hearing.   3-Leukocytosis: Likely reactive.  Resolved  4-Anxiety, sleep disorder: Continue with Remeron,  Zoloft,  melatonin. Hold gabapentin  while lethargic.  5-Diabetes: Sliding scale insulin. Resume metformin at discharge.  6-Hypertension: Continue with losartan. 7-Ophthalmic issues: Continue with home eyedrops.    Pressure Injury 10/14/17 Stage I -  Intact skin with non-blanchable redness of a localized area usually over a bony prominence. (Active)  10/14/17 1700  Location: Coccyx  Location Orientation: Posterior;Medial  Staging: Stage I -  Intact skin with non-blanchable redness of a localized area usually over a bony prominence.  Wound Description (Comments):   Present on Admission: Yes     Discharge Diagnoses:  Principal Problem:   Fracture of multiple pubic rami, right, closed, initial encounter [facility]) Active Problems:   Essential hypertension   Hyperlipidemia   Diabetes ([place])   Anxiety    Discharge Instructions  Discharge Instructions    Diet - low sodium heart healthy   Complete by: As directed    Increase activity slowly   Complete by: As directed      Allergies as of 06/23/2020      Reactions   Robaxin [methocarbamol] Rash, Other (See Comments)   Red rash on right side of face and forehead cleared after benadryl given, per [name], RN   Ancef [cefazolin] Diarrhea   Morphine And Related Itching   Penicillins Itching   Tolerated Unasyn Has patient had a PCN reaction causing immediate rash, facial/tongue/throat swelling, SOB or lightheadedness with hypotension: Yes Has patient had a PCN reaction causing severe rash involving mucus membranes or skin necrosis: No Has patient had a PCN reaction that required hospitalization: Was in hospital Has patient had a PCN reaction occurring within the last 10 years: No If all of the above answers are "NO", then may proceed with  Cephalosporin use.   Tape Other (See Comments)   Bandages and leads cause bruising and weeping at sites   Flurbiprofen Diarrhea      Medication List    STOP taking these medications   guaiFENesin 600 MG 12 hr tablet Commonly  known as: MUCINEX   oxyCODONE-acetaminophen 10-325 MG tablet Commonly known as: PERCOCET     TAKE these medications   acetaminophen 500 MG tablet Commonly known as: TYLENOL Take 1 tablet (500 mg total) by mouth 3 (three) times daily.   ALPRAZolam 0.25 MG tablet Commonly known as: XANAX Take 1 tablet (0.25 mg total) by mouth at bedtime as needed for sleep.   aspirin EC 81 MG tablet Take 81 mg by mouth daily as needed for mild pain (or sinus pressure).   DSS 100 MG Caps Take 100 mg by mouth 2 (two) times daily. What changed: when to take this   gabapentin 300 MG capsule Commonly known as: NEURONTIN Take 300 mg by mouth See admin instructions. Take 300 mg by mouth in the morning & at bedtime and an additional 300 mg during the day as needed for pain   losartan 100 MG tablet Commonly known as: COZAAR Take 100 mg by mouth at bedtime.   MELATONIN PO Take 1 tablet by mouth at bedtime.   metFORMIN 500 MG tablet Commonly known as: GLUCOPHAGE Take 500 mg by mouth at bedtime.   mirtazapine 15 MG tablet Commonly known as: REMERON Take 15 mg by mouth at bedtime.   Muro 128 5 % ophthalmic ointment Generic drug: sodium chloride Place 1 application into the right eye at bedtime.   polyethylene glycol 17 g packet Commonly known as: MIRALAX / GLYCOLAX Take 17 g by mouth 2 (two) times daily.   PreserVision AREDS 2 Caps Take 1 capsule by mouth daily with breakfast.   senna-docusate 8.6-50 MG tablet Commonly known as: Senokot-S Take 1 tablet by mouth 2 (two) times daily.   sertraline 50 MG tablet Commonly known as: ZOLOFT Take 50 mg by mouth at bedtime.   Simply Sleep 25 MG tablet Generic drug: diphenhydrAMINE Take 25 mg by mouth at bedtime as needed for sleep.   Systane Ultra PF 0.4-0.3 % Soln Generic drug: Polyethyl Glyc-Propyl Glyc PF Place 1-2 drops into both eyes 3 (three) times daily as needed (for irritation).   traMADol 50 MG tablet Commonly known as:  ULTRAM Take 0.5 tablets (25 mg total) by mouth every 6 (six) hours as needed for up to 3 days for moderate pain.   VITAMIN-B COMPLEX PO Take 1 tablet by mouth 2 (two) times a week.       Follow-up Information    Crist Infante, MD Follow up in 3 week(s).   Specialty: Internal Medicine Contact information: [place] [address] [phone]        Netta Cedars, MD Follow up in 2 week(s).   Specialty: Orthopedic Surgery Contact information: [address] [place] [address] [phone]              Allergies  Allergen Reactions  . Robaxin [Methocarbamol] Rash and Other (See Comments)    Red rash on right side of face and forehead cleared after benadryl given, per [name], RN  . Ancef [Cefazolin] Diarrhea  . Morphine And Related Itching  . Penicillins Itching    Tolerated Unasyn Has patient had a PCN reaction causing immediate rash, facial/tongue/throat swelling, SOB or lightheadedness with hypotension: Yes Has patient had a PCN reaction  causing severe rash involving mucus membranes or skin necrosis: No Has patient had a PCN reaction that required hospitalization: Was in hospital Has patient had a PCN reaction occurring within the last 10 years: No If all of the above answers are "NO", then may proceed with Cephalosporin use.   . Tape Other (See Comments)    Bandages and leads cause bruising and weeping at sites  . Flurbiprofen Diarrhea    Consultations: Dr Veverly Fells  Procedures/Studies: CT Head Wo Contrast  Result Date: 06/20/2020 CLINICAL DATA:  Tripped and fell EXAM: CT HEAD WITHOUT CONTRAST TECHNIQUE: Contiguous axial images were obtained from the base of the skull through the vertex without intravenous contrast. COMPARISON:  01/29/2020 FINDINGS: Brain: No acute infarct or hemorrhage. Lateral ventricles and midline structures are stable. No acute extra-axial fluid collections. No mass effect. Vascular: No hyperdense vessel or  unexpected calcification. Skull: Normal. Negative for fracture or focal lesion. Sinuses/Orbits: No acute finding. Other: None. IMPRESSION: 1. Stable head CT, no acute process. Electronically Signed   By: Randa Ngo M.D.   On: 06/20/2020 16:59   CT Cervical Spine Wo Contrast  Result Date: 06/20/2020 CLINICAL DATA:  Tripped and fell EXAM: CT CERVICAL SPINE WITHOUT CONTRAST TECHNIQUE: Multidetector CT imaging of the cervical spine was performed without intravenous contrast. Multiplanar CT image reconstructions were also generated. COMPARISON:  01/29/2020 FINDINGS: Alignment: Alignment is stable, with minimal anterolisthesis of C3 on C4 and C4 on C5. This is likely due to extensive facet hypertrophic changes. Skull base and vertebrae: No acute fracture. No primary bone lesion or focal pathologic process. Chronic mild compression deformities of T1 and T2 unchanged. Soft tissues and spinal canal: No prevertebral fluid or swelling. No visible canal hematoma. Disc levels: Marked degenerative changes at C1-C2 with significant pannus, stable. Stable multilevel spondylosis greatest at C5-6 and C6-7. Diffuse facet hypertrophy unchanged. Upper chest: Airway is patent. Biapical pleural and parenchymal scarring unchanged. Other: Reconstructed images demonstrate no additional findings. IMPRESSION: 1. No acute cervical spine fracture. 2. Stable multilevel degenerative changes. Electronically Signed   By: Randa Ngo M.D.   On: 06/20/2020 17:04   CT PELVIS WO CONTRAST  Result Date: 06/20/2020 CLINICAL DATA:  Pelvic fractures. EXAM: CT PELVIS WITHOUT CONTRAST TECHNIQUE: Multidetector CT imaging of the pelvis was performed following the standard protocol without intravenous contrast. COMPARISON:  Hip x-ray June 20, 2020. CT of the abdomen and pelvis June 20, 2018 FINDINGS: Urinary Tract: The bladder is mildly distended but otherwise normal. Distal ureters are unremarkable. The lower pole the right kidney is  unremarkable. Bowel: Colonic diverticulosis is seen without diverticulitis. The bowel is otherwise unremarkable. Vascular/Lymphatic: Calcified atherosclerosis is seen in the distal abdominal aorta, iliac vessels, and femoral vessels. Reproductive:  No mass or other significant abnormality Other:  None. Musculoskeletal: There is a mild displaced fracture of the right inferior pubic ramus. There is a displaced comminuted fracture of the right superior pubic ramus. No extension into the acetabulum identified. There is a fracture through the posterior right iliac bone best seen on coronal image 90 and 91. This fracture is also seen on axial images 14 and 15. There appears to be subtle extension into the right SI joint as seen on coronal image 90. No widening of the SI joint identified. The fracture lines also extend into the base of the right iliac crest as seen on sagittal image 55 and coronal image 70. IMPRESSION: 1. Mildly displaced comminuted right inferior pubic ramus fracture. 2. Displaced comminuted fracture of the right  superior pubic ramus. 3. Fractures in the right iliac bone. Posterior fracture lines appear to extend into the SI joint although this is subtle and there is no widening of the SI joint. The fractures also extend into the base of the right iliac crest as above. 4. There may be a subtle fracture through the superior most aspect of the left pubic ramus is seen on axial image 41. Electronically Signed   By: Dorise Bullion III M.D   On: 06/20/2020 17:58   DG Hip Unilat With Pelvis 2-3 Views Right  Result Date: 06/20/2020 CLINICAL DATA:  86 year old female with fall. Right hip pain. EXAM: DG HIP (WITH OR WITHOUT PELVIS) 2-3V RIGHT COMPARISON:  CT abdomen pelvis dated 07/17/2018. 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 The arthroplasty components appear intact and in anatomic alignment. There is a minimally displaced fracture of the right superior and inferior pubic rami. No other acute  fracture. There is no dislocation. The bones are osteopenic. Vascular calcifications noted. The soft tissues are otherwise unremarkable. IMPRESSION: Minimally displaced fractures of the right superior and inferior pubic rami. Electronically Signed   By: Anner Crete M.D.   On: 06/20/2020 16:36    Subjective: She is alert, hard of hearing.  Denies pain,.   Discharge Exam: Vitals:   06/23/20 0311 06/23/20 0758  BP: (!) 153/66 (!) 164/65  Pulse: 75 72  Resp: 18 16  Temp: 98.2 F (36.8 C) 98 F (36.7 C)  SpO2: 96% 97%     General: Pt is alert, awake, not in acute distress Cardiovascular: RRR, S1/S2 +, no rubs, no gallops Respiratory: CTA bilaterally, no wheezing, no rhonchi Abdominal: Soft, NT, ND, bowel sounds + Extremities: no edema, no cyanosis    The results of significant diagnostics from this hospitalization (including imaging, microbiology, ancillary and laboratory) are listed below for reference.     Microbiology: Recent Results (from the past 240 hour(s))  Resp Panel by RT-PCR (Flu A&B, Covid) Nasopharyngeal Swab     Status: None   Collection Time: 06/20/20  4:42 PM   Specimen: Nasopharyngeal Swab; Nasopharyngeal(NP) swabs in vial transport medium  Result Value Ref Range Status   SARS Coronavirus 2 by RT PCR NEGATIVE NEGATIVE Final    Comment: (NOTE) SARS-CoV-2 target nucleic acids are NOT DETECTED.  The SARS-CoV-2 RNA is generally detectable in upper respiratory specimens during the acute phase of infection. The lowest concentration of SARS-CoV-2 viral copies this assay can detect is 138 copies/mL. A negative result does not preclude SARS-Cov-2 infection and should not be used as the sole basis for treatment or other patient management decisions. A negative result may occur with  improper specimen collection/handling, submission of specimen other than nasopharyngeal swab, presence of viral mutation(s) within the areas targeted by this assay, and inadequate  number of viral copies(<138 copies/mL). A negative result must be combined with clinical observations, patient history, and epidemiological information. The expected result is Negative.  Fact Sheet for Patients:  EntrepreneurPulse.com.au  Fact Sheet for Healthcare Providers:  IncredibleEmployment.be  This test is no t yet approved or cleared by the [place] FDA and  has been authorized for detection and/or diagnosis of SARS-CoV-2 by FDA under an Emergency Use Authorization (EUA). This EUA will remain  in effect (meaning this test can be used) for the duration of the COVID-19 declaration under Section 564(b)(1) of the Act, 21 U.S.C.section 360bbb-3(b)(1), unless the authorization is terminated  or revoked sooner.       Influenza A by  PCR NEGATIVE NEGATIVE Final   Influenza B by PCR NEGATIVE NEGATIVE Final    Comment: (NOTE) The Xpert Xpress SARS-CoV-2/FLU/RSV plus assay is intended as an aid in the diagnosis of influenza from Nasopharyngeal swab specimens and should not be used as a sole basis for treatment. Nasal washings and aspirates are unacceptable for Xpert Xpress SARS-CoV-2/FLU/RSV testing.  Fact Sheet for Patients: EntrepreneurPulse.com.au  Fact Sheet for Healthcare Providers: IncredibleEmployment.be  This test is not yet approved or cleared by the [place] FDA and has been authorized for detection and/or diagnosis of SARS-CoV-2 by FDA under an Emergency Use Authorization (EUA). This EUA will remain in effect (meaning this test can be used) for the duration of the COVID-19 declaration under Section 564(b)(1) of the Act, 21 U.S.C. section 360bbb-3(b)(1), unless the authorization is terminated or revoked.  Performed at [facility], [place] [address], [place], [place] [address]      Labs: BNP (last 3 results) No results for input(s): BNP in the last 8760 hours. Basic Metabolic  Panel: Recent Labs  Lab 06/20/20 1642 06/21/20 0244 06/23/20 0827  NA 135 137 137  K 4.6 4.3 4.2  CL 101 100 102  CO2 25 24 25   GLUCOSE 198* 191* 120*  BUN 26* 19 16  CREATININE 0.88 0.80 0.74  CALCIUM 9.3 9.1 9.0   Liver Function Tests: Recent Labs  Lab 06/20/20 1642  AST 26  ALT 17  ALKPHOS 43  BILITOT 0.4  PROT 6.4*  ALBUMIN 3.6   No results for input(s): LIPASE, AMYLASE in the last 168 hours. No results for input(s): AMMONIA in the last 168 hours. CBC: Recent Labs  Lab 06/20/20 1642 06/21/20 0244  WBC 15.0* 9.5  NEUTROABS 12.4*  --   HGB 11.7* 10.9*  HCT 36.1 32.6*  MCV 96.8 94.5  PLT 283 241   Cardiac Enzymes: No results for input(s): CKTOTAL, CKMB, CKMBINDEX, TROPONINI in the last 168 hours. BNP: Invalid input(s): POCBNP CBG: Recent Labs  Lab 06/22/20 0637 06/22/20 1205 06/22/20 1630 06/22/20 2211 06/23/20 0653  GLUCAP 118* 161* 115* 113* 129*   D-Dimer No results for input(s): DDIMER in the last 72 hours. Hgb A1c Recent Labs    06/20/20 1642  HGBA1C 6.6*   Lipid Profile No results for input(s): CHOL, HDL, LDLCALC, TRIG, CHOLHDL, LDLDIRECT in the last 72 hours. Thyroid function studies No results for input(s): TSH, T4TOTAL, T3FREE, THYROIDAB in the last 72 hours.  Invalid input(s): FREET3 Anemia work up No results for input(s): VITAMINB12, FOLATE, FERRITIN, TIBC, IRON, RETICCTPCT in the last 72 hours. Urinalysis    Component Value Date/Time   COLORURINE YELLOW 11/26/2017 1412   APPEARANCEUR CLEAR 11/26/2017 1412   LABSPEC 1.011 11/26/2017 1412   PHURINE 6.0 11/26/2017 1412   GLUCOSEU NEGATIVE 11/26/2017 1412   HGBUR NEGATIVE 11/26/2017 1412   BILIRUBINUR NEGATIVE 11/26/2017 1412   KETONESUR NEGATIVE 11/26/2017 1412   PROTEINUR NEGATIVE 11/26/2017 1412   UROBILINOGEN 0.2 10/12/2014 0315   NITRITE NEGATIVE 11/26/2017 1412   LEUKOCYTESUR NEGATIVE 11/26/2017 1412   Sepsis Labs Invalid input(s): PROCALCITONIN,  WBC,   LACTICIDVEN Microbiology Recent Results (from the past 240 hour(s))  Resp Panel by RT-PCR (Flu A&B, Covid) Nasopharyngeal Swab     Status: None   Collection Time: 06/20/20  4:42 PM   Specimen: Nasopharyngeal Swab; Nasopharyngeal(NP) swabs in vial transport medium  Result Value Ref Range Status   SARS Coronavirus 2 by RT PCR NEGATIVE NEGATIVE Final    Comment: (NOTE) SARS-CoV-2 target nucleic  acids are NOT DETECTED.  The SARS-CoV-2 RNA is generally detectable in upper respiratory specimens during the acute phase of infection. The lowest concentration of SARS-CoV-2 viral copies this assay can detect is 138 copies/mL. A negative result does not preclude SARS-Cov-2 infection and should not be used as the sole basis for treatment or other patient management decisions. A negative result may occur with  improper specimen collection/handling, submission of specimen other than nasopharyngeal swab, presence of viral mutation(s) within the areas targeted by this assay, and inadequate number of viral copies(<138 copies/mL). A negative result must be combined with clinical observations, patient history, and epidemiological information. The expected result is Negative.  Fact Sheet for Patients:  EntrepreneurPulse.com.au  Fact Sheet for Healthcare Providers:  IncredibleEmployment.be  This test is no t yet approved or cleared by the [place] FDA and  has been authorized for detection and/or diagnosis of SARS-CoV-2 by FDA under an Emergency Use Authorization (EUA). This EUA will remain  in effect (meaning this test can be used) for the duration of the COVID-19 declaration under Section 564(b)(1) of the Act, 21 U.S.C.section 360bbb-3(b)(1), unless the authorization is terminated  or revoked sooner.       Influenza A by PCR NEGATIVE NEGATIVE Final   Influenza B by PCR NEGATIVE NEGATIVE Final    Comment: (NOTE) The Xpert Xpress SARS-CoV-2/FLU/RSV plus  assay is intended as an aid in the diagnosis of influenza from Nasopharyngeal swab specimens and should not be used as a sole basis for treatment. Nasal washings and aspirates are unacceptable for Xpert Xpress SARS-CoV-2/FLU/RSV testing.  Fact Sheet for Patients: EntrepreneurPulse.com.au  Fact Sheet for Healthcare Providers: IncredibleEmployment.be  This test is not yet approved or cleared by the [place] FDA and has been authorized for detection and/or diagnosis of SARS-CoV-2 by FDA under an Emergency Use Authorization (EUA). This EUA will remain in effect (meaning this test can be used) for the duration of the COVID-19 declaration under Section 564(b)(1) of the Act, 21 U.S.C. section 360bbb-3(b)(1), unless the authorization is terminated or revoked.  Performed at [facility], [place] [address] [place], [place] [address]      Time coordinating discharge: 40 minutes  SIGNED:   Elmarie Shiley, MD  [facility]

## 2020-06-23 NOTE — Patient Outreach (Signed)
[facility]) Care Management  06/23/2020  BRIELLE MORO [birth year] [id number]   [place] Discipline closure. Patient has been admitted to hospital. Patient will be followed by Complex Care Coordinator once discharged.  Plan: RN Health Coach Discipline Closure Discipline Closure letter sent to PCP  [place] Management [phone]

## 2020-06-24 ENCOUNTER — Encounter (HOSPITAL_COMMUNITY): Payer: Self-pay | Admitting: Internal Medicine

## 2020-06-24 DIAGNOSIS — M5416 Radiculopathy, lumbar region: Secondary | ICD-10-CM | POA: Diagnosis not present

## 2020-06-24 DIAGNOSIS — H409 Unspecified glaucoma: Secondary | ICD-10-CM | POA: Diagnosis not present

## 2020-06-24 DIAGNOSIS — R4189 Other symptoms and signs involving cognitive functions and awareness: Secondary | ICD-10-CM | POA: Diagnosis not present

## 2020-06-24 DIAGNOSIS — S329XXD Fracture of unspecified parts of lumbosacral spine and pelvis, subsequent encounter for fracture with routine healing: Secondary | ICD-10-CM | POA: Diagnosis not present

## 2020-06-24 DIAGNOSIS — E1159 Type 2 diabetes mellitus with other circulatory complications: Secondary | ICD-10-CM | POA: Diagnosis not present

## 2020-06-24 DIAGNOSIS — R262 Difficulty in walking, not elsewhere classified: Secondary | ICD-10-CM | POA: Diagnosis not present

## 2020-06-24 DIAGNOSIS — I10 Essential (primary) hypertension: Secondary | ICD-10-CM | POA: Diagnosis not present

## 2020-06-24 DIAGNOSIS — Z741 Need for assistance with personal care: Secondary | ICD-10-CM | POA: Diagnosis not present

## 2020-06-24 DIAGNOSIS — B351 Tinea unguium: Secondary | ICD-10-CM | POA: Diagnosis not present

## 2020-06-24 DIAGNOSIS — E785 Hyperlipidemia, unspecified: Secondary | ICD-10-CM | POA: Diagnosis not present

## 2020-06-24 DIAGNOSIS — M549 Dorsalgia, unspecified: Secondary | ICD-10-CM | POA: Diagnosis not present

## 2020-06-24 DIAGNOSIS — M5410 Radiculopathy, site unspecified: Secondary | ICD-10-CM | POA: Diagnosis not present

## 2020-06-24 DIAGNOSIS — M6281 Muscle weakness (generalized): Secondary | ICD-10-CM | POA: Diagnosis not present

## 2020-06-24 DIAGNOSIS — F419 Anxiety disorder, unspecified: Secondary | ICD-10-CM | POA: Diagnosis not present

## 2020-06-24 DIAGNOSIS — N390 Urinary tract infection, site not specified: Secondary | ICD-10-CM | POA: Diagnosis not present

## 2020-06-24 DIAGNOSIS — W19XXXD Unspecified fall, subsequent encounter: Secondary | ICD-10-CM | POA: Diagnosis not present

## 2020-06-24 DIAGNOSIS — F39 Unspecified mood [affective] disorder: Secondary | ICD-10-CM | POA: Diagnosis not present

## 2020-06-24 DIAGNOSIS — E119 Type 2 diabetes mellitus without complications: Secondary | ICD-10-CM | POA: Diagnosis not present

## 2020-06-24 DIAGNOSIS — K589 Irritable bowel syndrome without diarrhea: Secondary | ICD-10-CM | POA: Diagnosis not present

## 2020-06-24 DIAGNOSIS — R2681 Unsteadiness on feet: Secondary | ICD-10-CM | POA: Diagnosis not present

## 2020-06-24 DIAGNOSIS — S329XXA Fracture of unspecified parts of lumbosacral spine and pelvis, initial encounter for closed fracture: Secondary | ICD-10-CM | POA: Diagnosis not present

## 2020-06-24 DIAGNOSIS — R278 Other lack of coordination: Secondary | ICD-10-CM | POA: Diagnosis not present

## 2020-06-24 DIAGNOSIS — S32811D Multiple fractures of pelvis with unstable disruption of pelvic ring, subsequent encounter for fracture with routine healing: Secondary | ICD-10-CM | POA: Diagnosis not present

## 2020-06-24 DIAGNOSIS — M810 Age-related osteoporosis without current pathological fracture: Secondary | ICD-10-CM | POA: Diagnosis not present

## 2020-06-24 DIAGNOSIS — S3289XA Fracture of other parts of pelvis, initial encounter for closed fracture: Secondary | ICD-10-CM | POA: Diagnosis not present

## 2020-06-24 DIAGNOSIS — S32591A Other specified fracture of right pubis, initial encounter for closed fracture: Secondary | ICD-10-CM | POA: Diagnosis not present

## 2020-06-24 LAB — SARS CORONAVIRUS 2 BY RT PCR (HOSPITAL ORDER, PERFORMED IN ~~LOC~~ HOSPITAL LAB): SARS Coronavirus 2: NEGATIVE

## 2020-06-24 LAB — GLUCOSE, CAPILLARY
Glucose-Capillary: 181 mg/dL — ABNORMAL HIGH (ref 70–99)
Glucose-Capillary: 77 mg/dL (ref 70–99)

## 2020-06-24 MED ORDER — SODIUM CHLORIDE 0.9 % IV SOLN
1.0000 g | INTRAVENOUS | Status: DC
  Administered 2020-06-24: 1 g via INTRAVENOUS
  Filled 2020-06-24: qty 10

## 2020-06-24 MED ORDER — OXYCODONE-ACETAMINOPHEN 10-325 MG PO TABS
0.5000 | ORAL_TABLET | Freq: Four times a day (QID) | ORAL | 0 refills | Status: DC | PRN
Start: 2020-06-24 — End: 2021-10-16

## 2020-06-24 MED ORDER — CEPHALEXIN 500 MG PO CAPS: 500.0000 mg | ORAL_CAPSULE | Freq: Two times a day (BID) | ORAL | 0 refills | Status: AC

## 2020-06-24 NOTE — TOC Transition Note (Signed)
Transition of Care [facility]) - CM/SW Discharge Note   Patient Details  Name: Marissa Ryan MRN: [id number] Date of Birth: Feb 09, 1925  [facility] CM/SW Contact:  Curlene Labrum, RN Phone Number: 06/24/2020, 11:37 AM   Clinical Narrative:    Case management called and spoke with Marissa Ryan and they are not admitting any patient's for [facility] this week.  I have not heard back from [facility] as well for bed availability.  [facility] has an available bed for the patient and I spoke with the patient's granddaughter and she is in agreement for admission today.  The patient is fully vaccinated and will transfer by [facility] to the facility today.  The granddaughter plans to travel to the facility at 3:30 pm to meet and sign her in since the patient is unable to do so.  Sam, RN is aware of patient's transfer today at 2 pm and will call nursing report to [phone] for Room [address] [facility] at the facility.  All discharge clinicals were placed in the hub for [facility].  [facility] was called and scheduled for 2 pm today for transport.  I will continue to follow the patient for discharge to the facility.  I spoke with the granddaughter, Marissa Ryan, and she is aware of patient's discharge at this time.  Final next level of care: Skilled Nursing Facility Barriers to Discharge: No Barriers Identified   Patient Goals and CMS Choice Patient states their goals for this hospitalization and ongoing recovery are:: "I want to walk again" CMS Medicare.gov Compare Post Acute Care list provided to:: Patient Choice offered to / list presented to : Patient  Discharge Placement                       Discharge Plan and Services In-house Referral: Clinical Social Work                                   Social Determinants of Health ([place]) Interventions     Readmission Risk Interventions No flowsheet data found.

## 2020-06-24 NOTE — Progress Notes (Signed)
Occupational Therapy Treatment Patient Details Name: Marissa Ryan MRN: [id number] DOB: Sep 10, 1924 Today's Date: 06/24/2020    History of present illness Marissa Ryan is a 86 y.o. female with medical history significant of anxiety, diabetes, hypertension, hyperlipidemia, IBS, OA, ophthalmic issues who presents after a fall at home.  Stable pelvic fractures including bilateral pubic rami fractures and a right ileum fracture which extends into the SI joint.   OT comments  Patient continues to make progress towards goals in skilled OT session. Patient's session encompassed bed level ADLs as pt politely declining any transfers or bed mobility as she didn't want to get too tired for discharge. Pt able to complete ADLs with set up, however despite education, continued to decline mobility. Discharge remains appropriate, therapy will continue to follow.    Follow Up Recommendations  SNF;Supervision/Assistance - 24 hour    Equipment Recommendations  3 in 1 bedside commode;Wheelchair (measurements OT);Wheelchair cushion (measurements OT)    Recommendations for Other Services      Precautions / Restrictions Precautions Precautions: Fall Precaution Comments: monitor for WB limitation on R LE Restrictions Weight Bearing Restrictions: Yes LUE Weight Bearing: Weight bearing as tolerated RLE Weight Bearing: Partial weight bearing Other Position/Activity Restrictions: History of vetebral fx's and kyphotic.       Mobility Bed Mobility               General bed mobility comments: deferred despite all attempts to progress  Transfers                 General transfer comment: deferred despite all attempts to progress    Balance                                           ADL either performed or assessed with clinical judgement   ADL Overall ADL's : Needs assistance/impaired     Grooming: Wash/dry hands;Wash/dry face;Set up;Sitting;Oral care;Bed level                                Functional mobility during ADLs: Set up;Minimal assistance General ADL Comments: Not wanting to ambulate this session, focused on ADLs in hopes to progress to EOB, but pt declined     Vision       Perception     Praxis      Cognition Arousal/Alertness: Awake/alert Behavior During Therapy: WFL for tasks assessed/performed Overall Cognitive Status: Within Functional Limits for tasks assessed                                          Exercises     Shoulder Instructions       General Comments      Pertinent Vitals/ Pain       Pain Assessment: Faces Faces Pain Scale: No hurt  Home Living                                          Prior Functioning/Environment              Frequency  Min 2X/week        Progress Toward Goals  OT Goals(current goals can now be found in the care plan section)  Progress towards OT goals: Progressing toward goals  Acute Rehab OT Goals Patient Stated Goal: to rest so she can get out of the hospital OT Goal Formulation: With patient Time For Goal Achievement: 07/05/20 Potential to Achieve Goals: [place] Discharge plan remains appropriate    Co-evaluation                 AM-PAC OT "6 Clicks" Daily Activity     Outcome Measure   Help from another person eating meals?: None Help from another person taking care of personal grooming?: A Little Help from another person toileting, which includes using toliet, bedpan, or urinal?: A Lot Help from another person bathing (including washing, rinsing, drying)?: A Lot Help from another person to put on and taking off regular upper body clothing?: A Little Help from another person to put on and taking off regular lower body clothing?: A Lot 6 Click Score: 16    End of Session    OT Visit Diagnosis: Unsteadiness on feet (R26.81);Muscle weakness (generalized) (M62.81);History of falling (Z91.81);Pain Pain -  Right/Left: Right Pain - part of body: Leg   Activity Tolerance Patient tolerated treatment well   Patient Left in bed;with call bell/phone within reach;with bed alarm set   Nurse Communication Mobility status        Time: 1006-1020 OT Time Calculation (min): 14 min  Charges: OT General Charges $OT Visit: 1 Visit OT Treatments $Self Care/Home Management : 8-22 mins  Corinne Ports E[facility] F[facility], [facility] [phone] [place] 06/24/2020, 1:33 PM

## 2020-06-24 NOTE — Progress Notes (Signed)
Patient discharged via [facility] transportation to [facility].  Patient and daughter received AVS summary to transport with the patient to the facility.  All education material was reviewed with patient and daughter by primary RN at bedside with all lines and tubing including peripheral IV removed from the patient.  Patient observed stable as [facility] staff members transferred pt off the unit @1515 .

## 2020-06-24 NOTE — Discharge Summary (Addendum)
Physician Discharge Summary  Marissa Ryan WVP:[id number] DOB: 1924-11-21 DOA: 06/20/2020  PCP: Crist Infante, MD  Admit date: 06/20/2020 Discharge date: 06/24/2020  Admitted From: Home  Disposition:  SNF  Recommendations for Outpatient Follow-up:  1. Follow up with PCP in 1-2 weeks 2. Please obtain BMP/CBC in one week 3. Follow up with Ortho as needed 4. WB 50 % on right WBAT on left. 5. Please follow Urine culture results. Patient will be discharge on Keflex for 3 days.   Discharge Condition: Stable.  CODE STATUS: DNR Diet recommendation: Heart Healthy   Brief/Interim Summary: 86 year old with past medical history significant for anxiety, diabetes, hypertension, hyperlipidemia, IBS, who presented after a fall at home.  Patient tripped on a rug while walking in her home.  She fell on her right hip, she he get her head on the cabinet.  She presented complaining of right hip pain 7 out of 10.  Evaluation in the ED she was found to have right pubic ramus fracture, right iliac fracture, possible left pubic Ramey fracture. Orthopedic was consulted recommending medical management.   1-Multiple stable pelvic fractures:  Bilateral pubic Ramey fractures, right ilium fracture which extends into the S1 joint. Evaluated by orthopedic recommended 50% weightbearing on the right lower extremity weightbearing as tolerated on the left lower extremity. PT evaluation, recommending SNF. Scheduled Tylenol for pain.  Discontinue oxycodone due to oversedation, I started low-dose tramadol as needed. Bowel regimen to avoid constipation. On schedule Miralax.  Had BM.   2-Acute metabolic encephalopathy, toxic secondary to medications: Patient sleepy and lethargic the morning of 12/04. Likely from Percocet and a dose of IV Dilaudid. Discontinue Percocet and IV Dilaudid. low-dose tramadol, PRN/  Patient is more alert, she is hard of hearing.   3-Leukocytosis: Likely reactive.  Resolved  4-Anxiety, sleep  disorder: Continue with Remeron,  Zoloft,  melatonin. Hold gabapentin while lethargic.  5-Diabetes: Sliding scale insulin. Resume metformin at discharge.  6-Hypertension: Continue with losartan. 7-Ophthalmic issues: Continue with home eyedrops. 8-UTI; report increase frequency. UA with more than 50 WBC. Started on IV ceftriaxone. Received 2 doses in the hospital. Discharge on Keflex for 3 days. Please follow urine culture.  DNR; discussed with patient and granddaughter.   Pressure Injury 10/14/17 Stage I -  Intact skin with non-blanchable redness of a localized area usually over a bony prominence. (Active)  10/14/17 1700  Location: Coccyx  Location Orientation: Posterior;Medial  Staging: Stage I -  Intact skin with non-blanchable redness of a localized area usually over a bony prominence.  Wound Description (Comments):   Present on Admission: Yes     Discharge Diagnoses:  Principal Problem:   Fracture of multiple pubic rami, right, closed, initial encounter [facility]) Active Problems:   Essential hypertension   Hyperlipidemia   Diabetes ([place])   Anxiety    Discharge Instructions  Discharge Instructions    Diet - low sodium heart healthy   Complete by: As directed    Diet - low sodium heart healthy   Complete by: As directed    Diet - low sodium heart healthy   Complete by: As directed    Increase activity slowly   Complete by: As directed    Increase activity slowly   Complete by: As directed    Increase activity slowly   Complete by: As directed      Allergies as of 06/24/2020      Reactions   Robaxin [methocarbamol] Rash, Other (See Comments)   Red rash on  right side of face and forehead cleared after benadryl given, per [name], RN   Ancef [cefazolin] Diarrhea   Morphine And Related Itching   Penicillins Itching   Tolerated Unasyn Has patient had a PCN reaction causing immediate rash, facial/tongue/throat swelling, SOB or lightheadedness with hypotension:  Yes Has patient had a PCN reaction causing severe rash involving mucus membranes or skin necrosis: No Has patient had a PCN reaction that required hospitalization: Was in hospital Has patient had a PCN reaction occurring within the last 10 years: No If all of the above answers are "NO", then may proceed with Cephalosporin use.   Tape Other (See Comments)   Bandages and leads cause bruising and weeping at sites   Flurbiprofen Diarrhea      Medication List    STOP taking these medications   guaiFENesin 600 MG 12 hr tablet Commonly known as: MUCINEX     TAKE these medications   acetaminophen 500 MG tablet Commonly known as: TYLENOL Take 1 tablet (500 mg total) by mouth 3 (three) times daily.   ALPRAZolam 0.25 MG tablet Commonly known as: XANAX Take 1 tablet (0.25 mg total) by mouth at bedtime as needed for sleep.   aspirin EC 81 MG tablet Take 81 mg by mouth daily as needed for mild pain (or sinus pressure).   cephALEXin 500 MG capsule Commonly known as: KEFLEX Take 1 capsule (500 mg total) by mouth 2 (two) times daily for 3 days.   DSS 100 MG Caps Take 100 mg by mouth 2 (two) times daily. What changed: when to take this   gabapentin 300 MG capsule Commonly known as: NEURONTIN Take 300 mg by mouth See admin instructions. Take 300 mg by mouth in the morning & at bedtime and an additional 300 mg during the day as needed for pain   losartan 100 MG tablet Commonly known as: COZAAR Take 100 mg by mouth at bedtime.   MELATONIN PO Take 1 tablet by mouth at bedtime.   metFORMIN 500 MG tablet Commonly known as: GLUCOPHAGE Take 500 mg by mouth at bedtime.   mirtazapine 15 MG tablet Commonly known as: REMERON Take 15 mg by mouth at bedtime.   Muro 128 5 % ophthalmic ointment Generic drug: sodium chloride Place 1 application into the right eye at bedtime.   oxyCODONE-acetaminophen 10-325 MG tablet Commonly known as: PERCOCET Take 0.5-1 tablets by mouth every 6 (six)  hours as needed. What changed:   how much to take  reasons to take this   polyethylene glycol 17 g packet Commonly known as: MIRALAX / GLYCOLAX Take 17 g by mouth 2 (two) times daily.   PreserVision AREDS 2 Caps Take 1 capsule by mouth daily with breakfast.   senna-docusate 8.6-50 MG tablet Commonly known as: Senokot-S Take 1 tablet by mouth 2 (two) times daily.   sertraline 50 MG tablet Commonly known as: ZOLOFT Take 50 mg by mouth at bedtime.   Simply Sleep 25 MG tablet Generic drug: diphenhydrAMINE Take 25 mg by mouth at bedtime as needed for sleep.   Systane Ultra PF 0.4-0.3 % Soln Generic drug: Polyethyl Glyc-Propyl Glyc PF Place 1-2 drops into both eyes 3 (three) times daily as needed (for irritation).   VITAMIN-B COMPLEX PO Take 1 tablet by mouth 2 (two) times a week.       Follow-up Information    Crist Infante, MD Follow up in 3 week(s).   Specialty: Internal Medicine Contact information: [address] [place] [address] [phone]  Netta Cedars, MD Follow up in 2 week(s).   Specialty: Orthopedic Surgery Contact information: [address] [place] [address] [phone]              Allergies  Allergen Reactions  . Robaxin [Methocarbamol] Rash and Other (See Comments)    Red rash on right side of face and forehead cleared after benadryl given, per [name], RN  . Ancef [Cefazolin] Diarrhea  . Morphine And Related Itching  . Penicillins Itching    Tolerated Unasyn Has patient had a PCN reaction causing immediate rash, facial/tongue/throat swelling, SOB or lightheadedness with hypotension: Yes Has patient had a PCN reaction causing severe rash involving mucus membranes or skin necrosis: No Has patient had a PCN reaction that required hospitalization: Was in hospital Has patient had a PCN reaction occurring within the last 10 years: No If all of the above answers are "NO", then may proceed with Cephalosporin  use.   . Tape Other (See Comments)    Bandages and leads cause bruising and weeping at sites  . Flurbiprofen Diarrhea    Consultations: Dr Veverly Fells  Procedures/Studies: CT Head Wo Contrast  Result Date: 06/20/2020 CLINICAL DATA:  Tripped and fell EXAM: CT HEAD WITHOUT CONTRAST TECHNIQUE: Contiguous axial images were obtained from the base of the skull through the vertex without intravenous contrast. COMPARISON:  01/29/2020 FINDINGS: Brain: No acute infarct or hemorrhage. Lateral ventricles and midline structures are stable. No acute extra-axial fluid collections. No mass effect. Vascular: No hyperdense vessel or unexpected calcification. Skull: Normal. Negative for fracture or focal lesion. Sinuses/Orbits: No acute finding. Other: None. IMPRESSION: 1. Stable head CT, no acute process. Electronically Signed   By: Randa Ngo M.D.   On: 06/20/2020 16:59   CT Cervical Spine Wo Contrast  Result Date: 06/20/2020 CLINICAL DATA:  Tripped and fell EXAM: CT CERVICAL SPINE WITHOUT CONTRAST TECHNIQUE: Multidetector CT imaging of the cervical spine was performed without intravenous contrast. Multiplanar CT image reconstructions were also generated. COMPARISON:  01/29/2020 FINDINGS: Alignment: Alignment is stable, with minimal anterolisthesis of C3 on C4 and C4 on C5. This is likely due to extensive facet hypertrophic changes. Skull base and vertebrae: No acute fracture. No primary bone lesion or focal pathologic process. Chronic mild compression deformities of T1 and T2 unchanged. Soft tissues and spinal canal: No prevertebral fluid or swelling. No visible canal hematoma. Disc levels: Marked degenerative changes at C1-C2 with significant pannus, stable. Stable multilevel spondylosis greatest at C5-6 and C6-7. Diffuse facet hypertrophy unchanged. Upper chest: Airway is patent. Biapical pleural and parenchymal scarring unchanged. Other: Reconstructed images demonstrate no additional findings. IMPRESSION: 1. No  acute cervical spine fracture. 2. Stable multilevel degenerative changes. Electronically Signed   By: Randa Ngo M.D.   On: 06/20/2020 17:04   CT PELVIS WO CONTRAST  Result Date: 06/20/2020 CLINICAL DATA:  Pelvic fractures. EXAM: CT PELVIS WITHOUT CONTRAST TECHNIQUE: Multidetector CT imaging of the pelvis was performed following the standard protocol without intravenous contrast. COMPARISON:  Hip x-ray June 20, 2020. CT of the abdomen and pelvis June 20, 2018 FINDINGS: Urinary Tract: The bladder is mildly distended but otherwise normal. Distal ureters are unremarkable. The lower pole the right kidney is unremarkable. Bowel: Colonic diverticulosis is seen without diverticulitis. The bowel is otherwise unremarkable. Vascular/Lymphatic: Calcified atherosclerosis is seen in the distal abdominal aorta, iliac vessels, and femoral vessels. Reproductive:  No mass or other significant abnormality Other:  None. Musculoskeletal: There is a mild displaced fracture of the right inferior pubic  ramus. There is a displaced comminuted fracture of the right superior pubic ramus. No extension into the acetabulum identified. There is a fracture through the posterior right iliac bone best seen on coronal image 90 and 91. This fracture is also seen on axial images 14 and 15. There appears to be subtle extension into the right SI joint as seen on coronal image 90. No widening of the SI joint identified. The fracture lines also extend into the base of the right iliac crest as seen on sagittal image 55 and coronal image 70. IMPRESSION: 1. Mildly displaced comminuted right inferior pubic ramus fracture. 2. Displaced comminuted fracture of the right superior pubic ramus. 3. Fractures in the right iliac bone. Posterior fracture lines appear to extend into the SI joint although this is subtle and there is no widening of the SI joint. The fractures also extend into the base of the right iliac crest as above. 4. There may be a  subtle fracture through the superior most aspect of the left pubic ramus is seen on axial image 41. Electronically Signed   By: Dorise Bullion III M.D   On: 06/20/2020 17:58   DG Hip Unilat With Pelvis 2-3 Views Right  Result Date: 06/20/2020 CLINICAL DATA:  86 year old female with fall. Right hip pain. EXAM: DG HIP (WITH OR WITHOUT PELVIS) 2-3V RIGHT COMPARISON:  CT abdomen pelvis dated 07/17/2018. 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 The arthroplasty components appear intact and in anatomic alignment. There is a minimally displaced fracture of the right superior and inferior pubic rami. No other acute fracture. There is no dislocation. The bones are osteopenic. Vascular calcifications noted. The soft tissues are otherwise unremarkable. IMPRESSION: Minimally displaced fractures of the right superior and inferior pubic rami. Electronically Signed   By: Anner Crete M.D.   On: 06/20/2020 16:36    Subjective: She is alert, hard of hearing.  Denies pain,.   Discharge Exam: Vitals:   06/24/20 0300 06/24/20 0736  BP: (!) 108/52 109/62  Pulse: 68 70  Resp: 15 16  Temp: 98.1 F (36.7 C) 98 F (36.7 C)  SpO2: 92% 94%     General: Pt is alert, awake, not in acute distress Cardiovascular: RRR, S1/S2 +, no rubs, no gallops Respiratory: CTA bilaterally, no wheezing, no rhonchi Abdominal: Soft, NT, ND, bowel sounds + Extremities: no edema, no cyanosis    The results of significant diagnostics from this hospitalization (including imaging, microbiology, ancillary and laboratory) are listed below for reference.     Microbiology: Recent Results (from the past 240 hour(s))  Resp Panel by RT-PCR (Flu A&B, Covid) Nasopharyngeal Swab     Status: None   Collection Time: 06/20/20  4:42 PM   Specimen: Nasopharyngeal Swab; Nasopharyngeal(NP) swabs in vial transport medium  Result Value Ref Range Status   SARS Coronavirus 2 by RT PCR NEGATIVE NEGATIVE Final    Comment:  (NOTE) SARS-CoV-2 target nucleic acids are NOT DETECTED.  The SARS-CoV-2 RNA is generally detectable in upper respiratory specimens during the acute phase of infection. The lowest concentration of SARS-CoV-2 viral copies this assay can detect is 138 copies/mL. A negative result does not preclude SARS-Cov-2 infection and should not be used as the sole basis for treatment or other patient management decisions. A negative result may occur with  improper specimen collection/handling, submission of specimen other than nasopharyngeal swab, presence of viral mutation(s) within the areas targeted by this assay, and inadequate number of viral copies(<138 copies/mL). A negative result must  be combined with clinical observations, patient history, and epidemiological information. The expected result is Negative.  Fact Sheet for Patients:  EntrepreneurPulse.com.au  Fact Sheet for Healthcare Providers:  IncredibleEmployment.be  This test is no t yet approved or cleared by the [place] FDA and  has been authorized for detection and/or diagnosis of SARS-CoV-2 by FDA under an Emergency Use Authorization (EUA). This EUA will remain  in effect (meaning this test can be used) for the duration of the COVID-19 declaration under Section 564(b)(1) of the Act, 21 U.S.C.section 360bbb-3(b)(1), unless the authorization is terminated  or revoked sooner.       Influenza A by PCR NEGATIVE NEGATIVE Final   Influenza B by PCR NEGATIVE NEGATIVE Final    Comment: (NOTE) The Xpert Xpress SARS-CoV-2/FLU/RSV plus assay is intended as an aid in the diagnosis of influenza from Nasopharyngeal swab specimens and should not be used as a sole basis for treatment. Nasal washings and aspirates are unacceptable for Xpert Xpress SARS-CoV-2/FLU/RSV testing.  Fact Sheet for Patients: EntrepreneurPulse.com.au  Fact Sheet for Healthcare  Providers: IncredibleEmployment.be  This test is not yet approved or cleared by the [place] FDA and has been authorized for detection and/or diagnosis of SARS-CoV-2 by FDA under an Emergency Use Authorization (EUA). This EUA will remain in effect (meaning this test can be used) for the duration of the COVID-19 declaration under Section 564(b)(1) of the Act, 21 U.S.C. section 360bbb-3(b)(1), unless the authorization is terminated or revoked.  Performed at [facility], [place] [address] [place], [place] [address]   Culture, Urine     Status: Abnormal (Preliminary result)   Collection Time: 06/23/20  2:24 PM   Specimen: Urine, Catheterized  Result Value Ref Range Status   Specimen Description URINE, CATHETERIZED  Final   Special Requests NONE  Final   Culture (A)  Final    >=100,000 COLONIES/mL ESCHERICHIA COLI SUSCEPTIBILITIES TO FOLLOW Performed at [facility], [place] [address], [place], [place] [address]    Report Status PENDING  Incomplete  SARS Coronavirus 2 by RT PCR (hospital order, performed in [facility] hospital lab) Nasopharyngeal Nasopharyngeal Swab     Status: None   Collection Time: 06/24/20  4:40 AM   Specimen: Nasopharyngeal Swab  Result Value Ref Range Status   SARS Coronavirus 2 NEGATIVE NEGATIVE Final    Comment: (NOTE) SARS-CoV-2 target nucleic acids are NOT DETECTED.  The SARS-CoV-2 RNA is generally detectable in upper and lower respiratory specimens during the acute phase of infection. The lowest concentration of SARS-CoV-2 viral copies this assay can detect is 250 copies / mL. A negative result does not preclude SARS-CoV-2 infection and should not be used as the sole basis for treatment or other patient management decisions.  A negative result may occur with improper specimen collection / handling, submission of specimen other than nasopharyngeal swab, presence of viral mutation(s) within the areas targeted by this  assay, and inadequate number of viral copies (<250 copies / mL). A negative result must be combined with clinical observations, patient history, and epidemiological information.  Fact Sheet for Patients:   StrictlyIdeas.no  Fact Sheet for Healthcare Providers: BankingDealers.co.za  This test is not yet approved or  cleared by the [place] FDA and has been authorized for detection and/or diagnosis of SARS-CoV-2 by FDA under an Emergency Use Authorization (EUA).  This EUA will remain in effect (meaning this test can be used) for the duration of the COVID-19 declaration under Section 564(b)(1) of the  Act, 21 U.S.C. section 360bbb-3(b)(1), unless the authorization is terminated or revoked sooner.  Performed at [facility], [place] [address], [place], [place] [address]      Labs: BNP (last 3 results) No results for input(s): BNP in the last 8760 hours. Basic Metabolic Panel: Recent Labs  Lab 06/20/20 1642 06/21/20 0244 06/23/20 0827  NA 135 137 137  K 4.6 4.3 4.2  CL 101 100 102  CO2 25 24 25   GLUCOSE 198* 191* 120*  BUN 26* 19 16  CREATININE 0.88 0.80 0.74  CALCIUM 9.3 9.1 9.0   Liver Function Tests: Recent Labs  Lab 06/20/20 1642  AST 26  ALT 17  ALKPHOS 43  BILITOT 0.4  PROT 6.4*  ALBUMIN 3.6   No results for input(s): LIPASE, AMYLASE in the last 168 hours. No results for input(s): AMMONIA in the last 168 hours. CBC: Recent Labs  Lab 06/20/20 1642 06/21/20 0244  WBC 15.0* 9.5  NEUTROABS 12.4*  --   HGB 11.7* 10.9*  HCT 36.1 32.6*  MCV 96.8 94.5  PLT 283 241   Cardiac Enzymes: No results for input(s): CKTOTAL, CKMB, CKMBINDEX, TROPONINI in the last 168 hours. BNP: Invalid input(s): POCBNP CBG: Recent Labs  Lab 06/23/20 1130 06/23/20 1702 06/23/20 2010 06/24/20 0631 06/24/20 1140  GLUCAP 144* 197* 182* 181* 77   D-Dimer No results for input(s): DDIMER in the last 72 hours. Hgb A1c No  results for input(s): HGBA1C in the last 72 hours. Lipid Profile No results for input(s): CHOL, HDL, LDLCALC, TRIG, CHOLHDL, LDLDIRECT in the last 72 hours. Thyroid function studies No results for input(s): TSH, T4TOTAL, T3FREE, THYROIDAB in the last 72 hours.  Invalid input(s): FREET3 Anemia work up No results for input(s): VITAMINB12, FOLATE, FERRITIN, TIBC, IRON, RETICCTPCT in the last 72 hours. Urinalysis    Component Value Date/Time   COLORURINE AMBER (A) 06/23/2020 1423   APPEARANCEUR TURBID (A) 06/23/2020 1423   LABSPEC 1.015 06/23/2020 1423   PHURINE 5.0 06/23/2020 1423   GLUCOSEU NEGATIVE 06/23/2020 1423   HGBUR SMALL (A) 06/23/2020 1423   BILIRUBINUR NEGATIVE 06/23/2020 1423   KETONESUR NEGATIVE 06/23/2020 1423   PROTEINUR 30 (A) 06/23/2020 1423   UROBILINOGEN 0.2 10/12/2014 0315   NITRITE NEGATIVE 06/23/2020 1423   LEUKOCYTESUR LARGE (A) 06/23/2020 1423   Sepsis Labs Invalid input(s): PROCALCITONIN,  WBC,  LACTICIDVEN Microbiology Recent Results (from the past 240 hour(s))  Resp Panel by RT-PCR (Flu A&B, Covid) Nasopharyngeal Swab     Status: None   Collection Time: 06/20/20  4:42 PM   Specimen: Nasopharyngeal Swab; Nasopharyngeal(NP) swabs in vial transport medium  Result Value Ref Range Status   SARS Coronavirus 2 by RT PCR NEGATIVE NEGATIVE Final    Comment: (NOTE) SARS-CoV-2 target nucleic acids are NOT DETECTED.  The SARS-CoV-2 RNA is generally detectable in upper respiratory specimens during the acute phase of infection. The lowest concentration of SARS-CoV-2 viral copies this assay can detect is 138 copies/mL. A negative result does not preclude SARS-Cov-2 infection and should not be used as the sole basis for treatment or other patient management decisions. A negative result may occur with  improper specimen collection/handling, submission of specimen other than nasopharyngeal swab, presence of viral mutation(s) within the areas targeted by this assay,  and inadequate number of viral copies(<138 copies/mL). A negative result must be combined with clinical observations, patient history, and epidemiological information. The expected result is Negative.  Fact Sheet for Patients:  EntrepreneurPulse.com.au  Fact Sheet for Healthcare  Providers:  IncredibleEmployment.be  This test is no t yet approved or cleared by the [place] and  has been authorized for detection and/or diagnosis of SARS-CoV-2 by FDA under an Emergency Use Authorization (EUA). This EUA will remain  in effect (meaning this test can be used) for the duration of the COVID-19 declaration under Section 564(b)(1) of the Act, 21 U.S.C.section 360bbb-3(b)(1), unless the authorization is terminated  or revoked sooner.       Influenza A by PCR NEGATIVE NEGATIVE Final   Influenza B by PCR NEGATIVE NEGATIVE Final    Comment: (NOTE) The Xpert Xpress SARS-CoV-2/FLU/RSV plus assay is intended as an aid in the diagnosis of influenza from Nasopharyngeal swab specimens and should not be used as a sole basis for treatment. Nasal washings and aspirates are unacceptable for Xpert Xpress SARS-CoV-2/FLU/RSV testing.  Fact Sheet for Patients: EntrepreneurPulse.com.au  Fact Sheet for Healthcare Providers: IncredibleEmployment.be  This test is not yet approved or cleared by the [place] FDA and has been authorized for detection and/or diagnosis of SARS-CoV-2 by FDA under an Emergency Use Authorization (EUA). This EUA will remain in effect (meaning this test can be used) for the duration of the COVID-19 declaration under Section 564(b)(1) of the Act, 21 U.S.C. section 360bbb-3(b)(1), unless the authorization is terminated or revoked.  Performed at [facility], [place] [address], [place], [place] [address]   Culture, Urine     Status: Abnormal (Preliminary result)   Collection Time: 06/23/20   2:24 PM   Specimen: Urine, Catheterized  Result Value Ref Range Status   Specimen Description URINE, CATHETERIZED  Final   Special Requests NONE  Final   Culture (A)  Final    >=100,000 COLONIES/mL ESCHERICHIA COLI SUSCEPTIBILITIES TO FOLLOW Performed at [facility], [place] [address] [place], [place] [address]    Report Status PENDING  Incomplete  SARS Coronavirus 2 by RT PCR (hospital order, performed in [facility] hospital lab) Nasopharyngeal Nasopharyngeal Swab     Status: None   Collection Time: 06/24/20  4:40 AM   Specimen: Nasopharyngeal Swab  Result Value Ref Range Status   SARS Coronavirus 2 NEGATIVE NEGATIVE Final    Comment: (NOTE) SARS-CoV-2 target nucleic acids are NOT DETECTED.  The SARS-CoV-2 RNA is generally detectable in upper and lower respiratory specimens during the acute phase of infection. The lowest concentration of SARS-CoV-2 viral copies this assay can detect is 250 copies / mL. A negative result does not preclude SARS-CoV-2 infection and should not be used as the sole basis for treatment or other patient management decisions.  A negative result may occur with improper specimen collection / handling, submission of specimen other than nasopharyngeal swab, presence of viral mutation(s) within the areas targeted by this assay, and inadequate number of viral copies (<250 copies / mL). A negative result must be combined with clinical observations, patient history, and epidemiological information.  Fact Sheet for Patients:   StrictlyIdeas.no  Fact Sheet for Healthcare Providers: BankingDealers.co.za  This test is not yet approved or  cleared by the [place] FDA and has been authorized for detection and/or diagnosis of SARS-CoV-2 by FDA under an Emergency Use Authorization (EUA).  This EUA will remain in effect (meaning this test can be used) for the duration of the COVID-19 declaration under Section  564(b)(1) of the Act, 21 U.S.C. section 360bbb-3(b)(1), unless the authorization is terminated or revoked sooner.  Performed at [facility], [place] [address], [place], [place] [address]  Time coordinating discharge: 40 minutes  SIGNED:   Elmarie Shiley, MD  [facility]

## 2020-06-24 NOTE — Plan of Care (Signed)

## 2020-06-24 NOTE — Progress Notes (Signed)
Patient's Oxygen >92% on room air during the night

## 2020-06-25 ENCOUNTER — Other Ambulatory Visit: Payer: Self-pay

## 2020-06-25 LAB — URINE CULTURE: Culture: >=100000 — AB

## 2020-06-25 NOTE — Patient Outreach (Signed)
[facility] [facility]) Care Management  06/25/2020  Marissa Ryan [birth year] [id number]  [facility] [ACO] Patient:  Medicare   Patient transitioned to a skilled nursing facility, [facility], was previously active with [place] alerted [facility] RN PAC of transition and follow up for post facility needs as appropriate.  For questions,  Natividad Brood, RN BSN [facility] Liaison  [phone] business mobile phone Toll free office [phone]  Fax number: [phone] [email or web address]

## 2020-06-26 DIAGNOSIS — M549 Dorsalgia, unspecified: Secondary | ICD-10-CM

## 2020-06-26 DIAGNOSIS — S3289XA Fracture of other parts of pelvis, initial encounter for closed fracture: Secondary | ICD-10-CM

## 2020-06-26 DIAGNOSIS — I10 Essential (primary) hypertension: Secondary | ICD-10-CM

## 2020-06-26 DIAGNOSIS — F39 Unspecified mood [affective] disorder: Secondary | ICD-10-CM

## 2020-06-26 DIAGNOSIS — E1159 Type 2 diabetes mellitus with other circulatory complications: Secondary | ICD-10-CM

## 2020-06-26 DIAGNOSIS — M5410 Radiculopathy, site unspecified: Secondary | ICD-10-CM

## 2020-06-27 ENCOUNTER — Ambulatory Visit: Payer: Medicare Other | Admitting: *Deleted

## 2020-07-21 DIAGNOSIS — S3289XA Fracture of other parts of pelvis, initial encounter for closed fracture: Secondary | ICD-10-CM | POA: Diagnosis not present

## 2020-07-21 DIAGNOSIS — M5416 Radiculopathy, lumbar region: Secondary | ICD-10-CM | POA: Diagnosis not present

## 2020-07-21 DIAGNOSIS — F39 Unspecified mood [affective] disorder: Secondary | ICD-10-CM | POA: Diagnosis not present

## 2020-07-21 DIAGNOSIS — E1159 Type 2 diabetes mellitus with other circulatory complications: Secondary | ICD-10-CM | POA: Diagnosis not present

## 2020-07-21 DIAGNOSIS — I10 Essential (primary) hypertension: Secondary | ICD-10-CM | POA: Diagnosis not present

## 2020-07-28 ENCOUNTER — Other Ambulatory Visit: Payer: Self-pay | Admitting: Orthopedic Surgery

## 2020-07-28 DIAGNOSIS — S329XXA Fracture of unspecified parts of lumbosacral spine and pelvis, initial encounter for closed fracture: Secondary | ICD-10-CM | POA: Diagnosis not present

## 2020-08-06 ENCOUNTER — Other Ambulatory Visit: Payer: Self-pay | Admitting: *Deleted

## 2020-08-06 NOTE — Patient Outreach (Signed)
Member screened for potential [facility] Care Management needs.  Verified in [place] (Patient Marissa Ryan) that member resides in [place] ([place]) under private pay.   No identifiable [facility] needs at this time.    Marthenia Rolling, MSN, RN,BSN [place] Acute Care Coordinator [phone] [place]) [phone]  (Toll free office)

## 2020-08-08 DIAGNOSIS — B351 Tinea unguium: Secondary | ICD-10-CM | POA: Diagnosis not present

## 2020-08-14 ENCOUNTER — Other Ambulatory Visit: Payer: Self-pay | Admitting: *Deleted

## 2020-08-14 NOTE — Patient Outreach (Signed)
[place] Coordinator follow up. Member screened for potential [facility] Care Management needs.  Per [place] (Patient Pearletha [place]) member transitioned home on 08/13/2020 from [facility] [place]. Verified with Admissions Coordinator that member transitioned to home with  [facility] home health for PT/OT services.   Attempted to reach Mrs. Lok at [phone] to discuss [facility] follow up. No answer. Unable to leave voicemail. Phone rang and rang then stopped. Attempted to reach via mobile [phone] [facility] (grandaughter). No answer. HIPAA compliant voicemail message left.  Member was active with [place] prior to hospital and SNF admission. Will re-refer to [place] Management RNCM for complex case management.   Marthenia Rolling, MSN, RN,BSN [place] Acute Care Coordinator [phone] [facility]) [phone]  (Toll free office)

## 2020-08-18 ENCOUNTER — Other Ambulatory Visit: Payer: Self-pay | Admitting: *Deleted

## 2020-08-18 NOTE — Patient Outreach (Signed)
[place] [facility]) Care Management  08/18/2020  Marissa Ryan April 09, 1925 [id number]   Referral Received 1/27 Post SNF d/c 1/26 Initial Outreach 1/31  RN spoke briefly with the pt who is very HOH. Introduced [facility] services and purpose for the call however pt unable to hear clearly. Ask if RN could speak with another family member Marissa Ryan). RN spoke with daughter-in-law Marissa Ryan with introduction via [facility] services and the purpose for calling the pt today. Explained the pt was unable to hear today's conversation completely. Discussed purpose services that can be extended to this pt if needed. Discussed [facility] or NP with [facility] if necessary for any ongoing medical issues pt's may need based upon her age 45 alone in the home and risk of falls (history via d/c recent from SNF). Prevention measure discussed and verified [facility] would be involved with PT/OT post SNF discharge.   Daughter-in-lae Marissa Ryan) very interested and thinks pt would benefit from the program however she would need to discuss this information with the pt directly for acceptance. States she is sick with family members with covid and it would be awhile but she will relay this information to the pt and call this RN case management back. RN offered to follow up later this week as a reminder.Daughter -in-law agreed to a call back on this coming Friday 2/4.   Will follow up at that time on possible services. RN also left a message with Marissa Lair, NP concerning this case for possible home visit if eligible.   Marissa Mina, RN Care Management Coordinator [place] Office [phone]

## 2020-08-22 ENCOUNTER — Other Ambulatory Visit: Payer: Self-pay | Admitting: *Deleted

## 2020-08-22 NOTE — Patient Outreach (Signed)
[facility]) Care Management  08/22/2020  Marissa Ryan April 11, 1925 [id number]   Transition of care/Telephone Assessment  RN requested to follow up today for possible enrollment however unsuccessful. RN able to leave a HIPAA approved voice message requesting a call back.  Will follow up over the next week for pending [facility] services.   Raina Mina, RN Care Management Coordinator [place] Office [phone]

## 2020-08-25 DIAGNOSIS — S32810D Multiple fractures of pelvis with stable disruption of pelvic ring, subsequent encounter for fracture with routine healing: Secondary | ICD-10-CM | POA: Diagnosis not present

## 2020-08-28 ENCOUNTER — Other Ambulatory Visit: Payer: Self-pay | Admitting: *Deleted

## 2020-08-28 NOTE — Patient Outreach (Signed)
[place] [facility]) Care Management  08/28/2020  Marissa Ryan 08/06/1924 [id number]   Transition of [place]3 Attempted another outreach call to the daughter in law who was suppose to further discuss [facility] services after the initial decline from the pt. Daughter in law feels pt needs the services and was going to provide more in dept discussion with the pt for possible reconsideration for [facility] services. RN has been unsuccessful with this outreach.   Will send [facility] letter and will follow up in a few weeks for pending services.  Raina Mina, RN Care Management Coordinator [place] Office [phone]

## 2020-09-03 DIAGNOSIS — F329 Major depressive disorder, single episode, unspecified: Secondary | ICD-10-CM | POA: Diagnosis not present

## 2020-09-03 DIAGNOSIS — N1831 Chronic kidney disease, stage 3a: Secondary | ICD-10-CM | POA: Diagnosis not present

## 2020-09-03 DIAGNOSIS — E785 Hyperlipidemia, unspecified: Secondary | ICD-10-CM | POA: Diagnosis not present

## 2020-09-03 DIAGNOSIS — E1169 Type 2 diabetes mellitus with other specified complication: Secondary | ICD-10-CM | POA: Diagnosis not present

## 2020-09-03 DIAGNOSIS — E1122 Type 2 diabetes mellitus with diabetic chronic kidney disease: Secondary | ICD-10-CM | POA: Diagnosis not present

## 2020-09-03 DIAGNOSIS — E041 Nontoxic single thyroid nodule: Secondary | ICD-10-CM | POA: Diagnosis not present

## 2020-09-03 DIAGNOSIS — M810 Age-related osteoporosis without current pathological fracture: Secondary | ICD-10-CM | POA: Diagnosis not present

## 2020-09-03 DIAGNOSIS — R609 Edema, unspecified: Secondary | ICD-10-CM | POA: Diagnosis not present

## 2020-09-03 DIAGNOSIS — M542 Cervicalgia: Secondary | ICD-10-CM | POA: Diagnosis not present

## 2020-09-03 DIAGNOSIS — M4850XA Collapsed vertebra, not elsewhere classified, site unspecified, initial encounter for fracture: Secondary | ICD-10-CM | POA: Diagnosis not present

## 2020-09-03 DIAGNOSIS — I10 Essential (primary) hypertension: Secondary | ICD-10-CM | POA: Diagnosis not present

## 2020-09-22 ENCOUNTER — Other Ambulatory Visit: Payer: Self-pay | Admitting: *Deleted

## 2020-09-22 NOTE — Patient Outreach (Signed)
[place] [facility]) Care Management  09/22/2020  Marissa Ryan 1925-07-02 [id number]   Telephone Assessment-Unsuccessful  RN has made several outreach attempts to reach the daughter Marissa Ryan who was going to further initial [facility] services to her mother who previous decline services. RN has not been able the daughter to confirm.  RN attempted to contact available contact numbers to day however no answer and other line was busy on 2 outreach calls today.  Will close this case at this time with no further call. Will also alert primary provider of the unsuccessful initiation of [facility] services at this time.  Raina Mina, RN Care Management Coordinator [place] Office [phone]

## 2020-09-24 ENCOUNTER — Ambulatory Visit: Payer: Self-pay | Admitting: *Deleted

## 2020-09-26 DIAGNOSIS — G894 Chronic pain syndrome: Secondary | ICD-10-CM | POA: Diagnosis not present

## 2020-10-10 ENCOUNTER — Other Ambulatory Visit (HOSPITAL_COMMUNITY): Payer: Self-pay | Admitting: *Deleted

## 2020-10-13 ENCOUNTER — Encounter (HOSPITAL_COMMUNITY): Payer: Medicare Other

## 2020-11-13 ENCOUNTER — Other Ambulatory Visit: Payer: Self-pay

## 2020-11-13 ENCOUNTER — Encounter (HOSPITAL_COMMUNITY)
Admission: RE | Admit: 2020-11-13 | Discharge: 2020-11-13 | Disposition: A | Discharge status: Home / Self Care | Payer: Medicare Other | Source: Ambulatory Visit | Attending: Internal Medicine | Admitting: Internal Medicine

## 2020-11-13 DIAGNOSIS — M810 Age-related osteoporosis without current pathological fracture: Secondary | ICD-10-CM | POA: Insufficient documentation

## 2020-11-13 MED ORDER — DENOSUMAB 60 MG/ML ~~LOC~~ SOSY
60.0000 mg | PREFILLED_SYRINGE | Freq: Once | SUBCUTANEOUS | Status: AC
  Administered 2020-11-13: 60 mg via SUBCUTANEOUS

## 2020-11-13 MED ORDER — DENOSUMAB 60 MG/ML ~~LOC~~ SOSY
PREFILLED_SYRINGE | SUBCUTANEOUS | Status: AC
  Filled 2020-11-13: qty 1

## 2020-12-30 DIAGNOSIS — H401133 Primary open-angle glaucoma, bilateral, severe stage: Secondary | ICD-10-CM | POA: Diagnosis not present

## 2020-12-30 DIAGNOSIS — H18221 Idiopathic corneal edema, right eye: Secondary | ICD-10-CM | POA: Diagnosis not present

## 2020-12-30 DIAGNOSIS — Z961 Presence of intraocular lens: Secondary | ICD-10-CM | POA: Diagnosis not present

## 2020-12-30 DIAGNOSIS — H16223 Keratoconjunctivitis sicca, not specified as Sjogren's, bilateral: Secondary | ICD-10-CM | POA: Diagnosis not present

## 2020-12-30 DIAGNOSIS — H353122 Nonexudative age-related macular degeneration, left eye, intermediate dry stage: Secondary | ICD-10-CM | POA: Diagnosis not present

## 2021-01-16 DIAGNOSIS — L89153 Pressure ulcer of sacral region, stage 3: Secondary | ICD-10-CM | POA: Diagnosis not present

## 2021-01-16 DIAGNOSIS — M542 Cervicalgia: Secondary | ICD-10-CM | POA: Diagnosis not present

## 2021-01-16 DIAGNOSIS — N1831 Chronic kidney disease, stage 3a: Secondary | ICD-10-CM | POA: Diagnosis not present

## 2021-01-16 DIAGNOSIS — E785 Hyperlipidemia, unspecified: Secondary | ICD-10-CM | POA: Diagnosis not present

## 2021-01-16 DIAGNOSIS — R609 Edema, unspecified: Secondary | ICD-10-CM | POA: Diagnosis not present

## 2021-01-16 DIAGNOSIS — M810 Age-related osteoporosis without current pathological fracture: Secondary | ICD-10-CM | POA: Diagnosis not present

## 2021-01-16 DIAGNOSIS — M4850XA Collapsed vertebra, not elsewhere classified, site unspecified, initial encounter for fracture: Secondary | ICD-10-CM | POA: Diagnosis not present

## 2021-01-16 DIAGNOSIS — E041 Nontoxic single thyroid nodule: Secondary | ICD-10-CM | POA: Diagnosis not present

## 2021-01-16 DIAGNOSIS — E1122 Type 2 diabetes mellitus with diabetic chronic kidney disease: Secondary | ICD-10-CM | POA: Diagnosis not present

## 2021-01-16 DIAGNOSIS — E1169 Type 2 diabetes mellitus with other specified complication: Secondary | ICD-10-CM | POA: Diagnosis not present

## 2021-01-16 DIAGNOSIS — I10 Essential (primary) hypertension: Secondary | ICD-10-CM | POA: Diagnosis not present

## 2021-01-20 DIAGNOSIS — Z23 Encounter for immunization: Secondary | ICD-10-CM | POA: Diagnosis not present

## 2021-01-23 DIAGNOSIS — M542 Cervicalgia: Secondary | ICD-10-CM | POA: Diagnosis not present

## 2021-01-23 DIAGNOSIS — M5459 Other low back pain: Secondary | ICD-10-CM | POA: Diagnosis not present

## 2021-01-23 DIAGNOSIS — M47812 Spondylosis without myelopathy or radiculopathy, cervical region: Secondary | ICD-10-CM | POA: Diagnosis not present

## 2021-01-23 DIAGNOSIS — G894 Chronic pain syndrome: Secondary | ICD-10-CM | POA: Diagnosis not present

## 2021-02-09 DIAGNOSIS — Z79899 Other long term (current) drug therapy: Secondary | ICD-10-CM | POA: Diagnosis not present

## 2021-02-09 DIAGNOSIS — Z5181 Encounter for therapeutic drug level monitoring: Secondary | ICD-10-CM | POA: Diagnosis not present

## 2021-04-17 ENCOUNTER — Encounter (HOSPITAL_COMMUNITY): Payer: Medicare Other

## 2021-05-04 DIAGNOSIS — M5416 Radiculopathy, lumbar region: Secondary | ICD-10-CM | POA: Diagnosis not present

## 2021-05-21 ENCOUNTER — Encounter (INDEPENDENT_AMBULATORY_CARE_PROVIDER_SITE_OTHER): Payer: Medicare Other | Admitting: Ophthalmology

## 2021-05-29 ENCOUNTER — Other Ambulatory Visit: Payer: Self-pay

## 2021-05-29 ENCOUNTER — Encounter (INDEPENDENT_AMBULATORY_CARE_PROVIDER_SITE_OTHER): Payer: Medicare Other | Admitting: Ophthalmology

## 2021-05-29 DIAGNOSIS — I10 Essential (primary) hypertension: Secondary | ICD-10-CM | POA: Diagnosis not present

## 2021-05-29 DIAGNOSIS — H43812 Vitreous degeneration, left eye: Secondary | ICD-10-CM

## 2021-05-29 DIAGNOSIS — H35032 Hypertensive retinopathy, left eye: Secondary | ICD-10-CM

## 2021-05-29 DIAGNOSIS — E113292 Type 2 diabetes mellitus with mild nonproliferative diabetic retinopathy without macular edema, left eye: Secondary | ICD-10-CM | POA: Diagnosis not present

## 2021-06-01 ENCOUNTER — Other Ambulatory Visit (HOSPITAL_COMMUNITY): Payer: Self-pay

## 2021-06-02 ENCOUNTER — Other Ambulatory Visit: Payer: Self-pay

## 2021-06-02 ENCOUNTER — Encounter (HOSPITAL_COMMUNITY)
Admission: RE | Admit: 2021-06-02 | Discharge: 2021-06-02 | Disposition: A | Discharge status: Home / Self Care | Payer: Medicare Other | Source: Ambulatory Visit | Attending: Internal Medicine | Admitting: Internal Medicine

## 2021-06-02 DIAGNOSIS — M810 Age-related osteoporosis without current pathological fracture: Secondary | ICD-10-CM | POA: Diagnosis not present

## 2021-06-02 MED ORDER — DENOSUMAB 60 MG/ML ~~LOC~~ SOSY
60.0000 mg | PREFILLED_SYRINGE | Freq: Once | SUBCUTANEOUS | Status: AC
  Administered 2021-06-02: 60 mg via SUBCUTANEOUS

## 2021-06-02 MED ORDER — DENOSUMAB 60 MG/ML ~~LOC~~ SOSY
PREFILLED_SYRINGE | SUBCUTANEOUS | Status: AC
  Filled 2021-06-02: qty 1

## 2021-06-04 DIAGNOSIS — I10 Essential (primary) hypertension: Secondary | ICD-10-CM | POA: Diagnosis not present

## 2021-06-04 DIAGNOSIS — E041 Nontoxic single thyroid nodule: Secondary | ICD-10-CM | POA: Diagnosis not present

## 2021-06-04 DIAGNOSIS — E785 Hyperlipidemia, unspecified: Secondary | ICD-10-CM | POA: Diagnosis not present

## 2021-06-04 DIAGNOSIS — N1831 Chronic kidney disease, stage 3a: Secondary | ICD-10-CM | POA: Diagnosis not present

## 2021-06-04 DIAGNOSIS — E1122 Type 2 diabetes mellitus with diabetic chronic kidney disease: Secondary | ICD-10-CM | POA: Diagnosis not present

## 2021-06-04 DIAGNOSIS — E559 Vitamin D deficiency, unspecified: Secondary | ICD-10-CM | POA: Diagnosis not present

## 2021-06-12 DIAGNOSIS — Z0000 Encounter for general adult medical examination without abnormal findings: Secondary | ICD-10-CM | POA: Diagnosis not present

## 2021-06-12 DIAGNOSIS — E1169 Type 2 diabetes mellitus with other specified complication: Secondary | ICD-10-CM | POA: Diagnosis not present

## 2021-06-12 DIAGNOSIS — I519 Heart disease, unspecified: Secondary | ICD-10-CM | POA: Diagnosis not present

## 2021-06-12 DIAGNOSIS — E1122 Type 2 diabetes mellitus with diabetic chronic kidney disease: Secondary | ICD-10-CM | POA: Diagnosis not present

## 2021-06-12 DIAGNOSIS — M4850XA Collapsed vertebra, not elsewhere classified, site unspecified, initial encounter for fracture: Secondary | ICD-10-CM | POA: Diagnosis not present

## 2021-06-12 DIAGNOSIS — E559 Vitamin D deficiency, unspecified: Secondary | ICD-10-CM | POA: Diagnosis not present

## 2021-06-12 DIAGNOSIS — I129 Hypertensive chronic kidney disease with stage 1 through stage 4 chronic kidney disease, or unspecified chronic kidney disease: Secondary | ICD-10-CM | POA: Diagnosis not present

## 2021-06-12 DIAGNOSIS — N1831 Chronic kidney disease, stage 3a: Secondary | ICD-10-CM | POA: Diagnosis not present

## 2021-06-12 DIAGNOSIS — F329 Major depressive disorder, single episode, unspecified: Secondary | ICD-10-CM | POA: Diagnosis not present

## 2021-06-12 DIAGNOSIS — M4692 Unspecified inflammatory spondylopathy, cervical region: Secondary | ICD-10-CM | POA: Diagnosis not present

## 2021-06-12 DIAGNOSIS — R809 Proteinuria, unspecified: Secondary | ICD-10-CM | POA: Diagnosis not present

## 2021-06-12 DIAGNOSIS — M810 Age-related osteoporosis without current pathological fracture: Secondary | ICD-10-CM | POA: Diagnosis not present

## 2021-06-18 DIAGNOSIS — M5416 Radiculopathy, lumbar region: Secondary | ICD-10-CM | POA: Diagnosis not present

## 2021-06-29 DIAGNOSIS — H401133 Primary open-angle glaucoma, bilateral, severe stage: Secondary | ICD-10-CM | POA: Diagnosis not present

## 2021-06-29 DIAGNOSIS — H16223 Keratoconjunctivitis sicca, not specified as Sjogren's, bilateral: Secondary | ICD-10-CM | POA: Diagnosis not present

## 2021-06-29 DIAGNOSIS — H353122 Nonexudative age-related macular degeneration, left eye, intermediate dry stage: Secondary | ICD-10-CM | POA: Diagnosis not present

## 2021-06-29 DIAGNOSIS — Z961 Presence of intraocular lens: Secondary | ICD-10-CM | POA: Diagnosis not present

## 2021-06-29 DIAGNOSIS — H18221 Idiopathic corneal edema, right eye: Secondary | ICD-10-CM | POA: Diagnosis not present

## 2021-09-23 DIAGNOSIS — M47812 Spondylosis without myelopathy or radiculopathy, cervical region: Secondary | ICD-10-CM | POA: Diagnosis not present

## 2021-09-23 DIAGNOSIS — G894 Chronic pain syndrome: Secondary | ICD-10-CM | POA: Diagnosis not present

## 2021-09-23 DIAGNOSIS — M542 Cervicalgia: Secondary | ICD-10-CM | POA: Diagnosis not present

## 2021-09-23 DIAGNOSIS — M5459 Other low back pain: Secondary | ICD-10-CM | POA: Diagnosis not present

## 2021-10-10 ENCOUNTER — Inpatient Hospital Stay (HOSPITAL_COMMUNITY)
Admission: EM | Admit: 2021-10-10 | Discharge: 2021-10-16 | DRG: 558 | Disposition: A | Discharge status: Skilled Nursing Facility | Payer: Medicare Other | Attending: Family Medicine | Admitting: Family Medicine

## 2021-10-10 ENCOUNTER — Other Ambulatory Visit: Payer: Self-pay

## 2021-10-10 ENCOUNTER — Emergency Department (HOSPITAL_COMMUNITY): Payer: Medicare Other

## 2021-10-10 ENCOUNTER — Encounter (HOSPITAL_COMMUNITY): Payer: Self-pay | Admitting: Internal Medicine

## 2021-10-10 DIAGNOSIS — E860 Dehydration: Secondary | ICD-10-CM | POA: Diagnosis present

## 2021-10-10 DIAGNOSIS — I10 Essential (primary) hypertension: Secondary | ICD-10-CM | POA: Diagnosis not present

## 2021-10-10 DIAGNOSIS — Z7984 Long term (current) use of oral hypoglycemic drugs: Secondary | ICD-10-CM

## 2021-10-10 DIAGNOSIS — L89151 Pressure ulcer of sacral region, stage 1: Secondary | ICD-10-CM | POA: Diagnosis present

## 2021-10-10 DIAGNOSIS — Z8249 Family history of ischemic heart disease and other diseases of the circulatory system: Secondary | ICD-10-CM

## 2021-10-10 DIAGNOSIS — S22080A Wedge compression fracture of T11-T12 vertebra, initial encounter for closed fracture: Secondary | ICD-10-CM | POA: Diagnosis not present

## 2021-10-10 DIAGNOSIS — Z8041 Family history of malignant neoplasm of ovary: Secondary | ICD-10-CM

## 2021-10-10 DIAGNOSIS — R531 Weakness: Secondary | ICD-10-CM | POA: Diagnosis not present

## 2021-10-10 DIAGNOSIS — R1310 Dysphagia, unspecified: Secondary | ICD-10-CM | POA: Diagnosis not present

## 2021-10-10 DIAGNOSIS — F03C11 Unspecified dementia, severe, with agitation: Secondary | ICD-10-CM | POA: Diagnosis not present

## 2021-10-10 DIAGNOSIS — Z9049 Acquired absence of other specified parts of digestive tract: Secondary | ICD-10-CM

## 2021-10-10 DIAGNOSIS — E785 Hyperlipidemia, unspecified: Secondary | ICD-10-CM | POA: Diagnosis present

## 2021-10-10 DIAGNOSIS — Z823 Family history of stroke: Secondary | ICD-10-CM

## 2021-10-10 DIAGNOSIS — M6282 Rhabdomyolysis: Principal | ICD-10-CM | POA: Diagnosis present

## 2021-10-10 DIAGNOSIS — N390 Urinary tract infection, site not specified: Secondary | ICD-10-CM | POA: Diagnosis present

## 2021-10-10 DIAGNOSIS — E440 Moderate protein-calorie malnutrition: Secondary | ICD-10-CM | POA: Diagnosis present

## 2021-10-10 DIAGNOSIS — K5900 Constipation, unspecified: Secondary | ICD-10-CM | POA: Diagnosis not present

## 2021-10-10 DIAGNOSIS — Z20822 Contact with and (suspected) exposure to covid-19: Secondary | ICD-10-CM | POA: Diagnosis present

## 2021-10-10 DIAGNOSIS — Z85828 Personal history of other malignant neoplasm of skin: Secondary | ICD-10-CM | POA: Diagnosis not present

## 2021-10-10 DIAGNOSIS — F03C0 Unspecified dementia, severe, without behavioral disturbance, psychotic disturbance, mood disturbance, and anxiety: Secondary | ICD-10-CM | POA: Diagnosis present

## 2021-10-10 DIAGNOSIS — S32010A Wedge compression fracture of first lumbar vertebra, initial encounter for closed fracture: Secondary | ICD-10-CM | POA: Diagnosis not present

## 2021-10-10 DIAGNOSIS — Z66 Do not resuscitate: Secondary | ICD-10-CM | POA: Diagnosis present

## 2021-10-10 DIAGNOSIS — B9620 Unspecified Escherichia coli [E. coli] as the cause of diseases classified elsewhere: Secondary | ICD-10-CM | POA: Diagnosis present

## 2021-10-10 DIAGNOSIS — M4854XD Collapsed vertebra, not elsewhere classified, thoracic region, subsequent encounter for fracture with routine healing: Secondary | ICD-10-CM | POA: Diagnosis not present

## 2021-10-10 DIAGNOSIS — Z79899 Other long term (current) drug therapy: Secondary | ICD-10-CM

## 2021-10-10 DIAGNOSIS — Z6825 Body mass index (BMI) 25.0-25.9, adult: Secondary | ICD-10-CM

## 2021-10-10 DIAGNOSIS — I9589 Other hypotension: Secondary | ICD-10-CM | POA: Diagnosis present

## 2021-10-10 DIAGNOSIS — E119 Type 2 diabetes mellitus without complications: Secondary | ICD-10-CM | POA: Diagnosis present

## 2021-10-10 DIAGNOSIS — S8991XA Unspecified injury of right lower leg, initial encounter: Secondary | ICD-10-CM | POA: Diagnosis not present

## 2021-10-10 DIAGNOSIS — E876 Hypokalemia: Secondary | ICD-10-CM | POA: Diagnosis present

## 2021-10-10 DIAGNOSIS — H548 Legal blindness, as defined in USA: Secondary | ICD-10-CM | POA: Diagnosis not present

## 2021-10-10 DIAGNOSIS — M808AXA Other osteoporosis with current pathological fracture, other site, initial encounter for fracture: Secondary | ICD-10-CM | POA: Diagnosis present

## 2021-10-10 DIAGNOSIS — Z96611 Presence of right artificial shoulder joint: Secondary | ICD-10-CM | POA: Diagnosis present

## 2021-10-10 DIAGNOSIS — S199XXA Unspecified injury of neck, initial encounter: Secondary | ICD-10-CM | POA: Diagnosis not present

## 2021-10-10 DIAGNOSIS — S32511A Fracture of superior rim of right pubis, initial encounter for closed fracture: Secondary | ICD-10-CM | POA: Diagnosis not present

## 2021-10-10 DIAGNOSIS — M159 Polyosteoarthritis, unspecified: Secondary | ICD-10-CM | POA: Diagnosis not present

## 2021-10-10 DIAGNOSIS — R41841 Cognitive communication deficit: Secondary | ICD-10-CM | POA: Diagnosis not present

## 2021-10-10 DIAGNOSIS — R55 Syncope and collapse: Secondary | ICD-10-CM | POA: Diagnosis not present

## 2021-10-10 DIAGNOSIS — R4182 Altered mental status, unspecified: Secondary | ICD-10-CM | POA: Diagnosis not present

## 2021-10-10 DIAGNOSIS — F419 Anxiety disorder, unspecified: Secondary | ICD-10-CM | POA: Diagnosis not present

## 2021-10-10 DIAGNOSIS — I482 Chronic atrial fibrillation, unspecified: Secondary | ICD-10-CM | POA: Diagnosis present

## 2021-10-10 DIAGNOSIS — W19XXXA Unspecified fall, initial encounter: Secondary | ICD-10-CM | POA: Diagnosis not present

## 2021-10-10 DIAGNOSIS — M4856XD Collapsed vertebra, not elsewhere classified, lumbar region, subsequent encounter for fracture with routine healing: Secondary | ICD-10-CM | POA: Diagnosis not present

## 2021-10-10 DIAGNOSIS — Z7401 Bed confinement status: Secondary | ICD-10-CM | POA: Diagnosis not present

## 2021-10-10 DIAGNOSIS — M810 Age-related osteoporosis without current pathological fracture: Secondary | ICD-10-CM | POA: Diagnosis present

## 2021-10-10 DIAGNOSIS — R29898 Other symptoms and signs involving the musculoskeletal system: Secondary | ICD-10-CM | POA: Diagnosis not present

## 2021-10-10 DIAGNOSIS — M6281 Muscle weakness (generalized): Secondary | ICD-10-CM | POA: Diagnosis not present

## 2021-10-10 DIAGNOSIS — E0800 Diabetes mellitus due to underlying condition with hyperosmolarity without nonketotic hyperglycemic-hyperosmolar coma (NKHHC): Secondary | ICD-10-CM | POA: Diagnosis not present

## 2021-10-10 DIAGNOSIS — M25461 Effusion, right knee: Secondary | ICD-10-CM | POA: Diagnosis not present

## 2021-10-10 DIAGNOSIS — R262 Difficulty in walking, not elsewhere classified: Secondary | ICD-10-CM | POA: Diagnosis not present

## 2021-10-10 DIAGNOSIS — F0390 Unspecified dementia without behavioral disturbance: Secondary | ICD-10-CM | POA: Diagnosis not present

## 2021-10-10 DIAGNOSIS — I4891 Unspecified atrial fibrillation: Secondary | ICD-10-CM | POA: Diagnosis not present

## 2021-10-10 LAB — URINALYSIS, ROUTINE W REFLEX MICROSCOPIC
Bilirubin Urine: NEGATIVE
Glucose, UA: NEGATIVE mg/dL
Ketones, ur: 40 mg/dL — AB
Nitrite: NEGATIVE
Protein, ur: 100 mg/dL — AB
Specific Gravity, Urine: >1.03 — ABNORMAL HIGH (ref 1.005–1.030)
pH: 6 (ref 5.0–8.0)

## 2021-10-10 LAB — CBC WITH DIFFERENTIAL/PLATELET
Abs Immature Granulocytes: 0.06 10*3/uL (ref 0.00–0.07)
Basophils Absolute: 0 10*3/uL (ref 0.0–0.1)
Basophils Relative: 0 %
Eosinophils Absolute: 0 10*3/uL (ref 0.0–0.5)
Eosinophils Relative: 0 %
HCT: 44.4 % (ref 36.0–46.0)
Hemoglobin: 14.9 g/dL (ref 12.0–15.0)
Immature Granulocytes: 0 %
Lymphocytes Relative: 8 %
Lymphs Abs: 1.2 10*3/uL (ref 0.7–4.0)
MCH: 31.6 pg (ref 26.0–34.0)
MCHC: 33.6 g/dL (ref 30.0–36.0)
MCV: 94.3 fL (ref 80.0–100.0)
Monocytes Absolute: 0.8 10*3/uL (ref 0.1–1.0)
Monocytes Relative: 6 %
Neutro Abs: 12.4 10*3/uL — ABNORMAL HIGH (ref 1.7–7.7)
Neutrophils Relative %: 86 %
Platelets: 341 10*3/uL (ref 150–400)
RBC: 4.71 MIL/uL (ref 3.87–5.11)
RDW: 12.2 % (ref 11.5–15.5)
WBC: 14.5 10*3/uL — ABNORMAL HIGH (ref 4.0–10.5)
nRBC: 0 % (ref 0.0–0.2)

## 2021-10-10 LAB — COMPREHENSIVE METABOLIC PANEL
ALT: 28 U/L (ref 0–44)
AST: 59 U/L — ABNORMAL HIGH (ref 15–41)
Albumin: 3.8 g/dL (ref 3.5–5.0)
Alkaline Phosphatase: 50 U/L (ref 38–126)
Anion gap: 13 (ref 5–15)
BUN: 19 mg/dL (ref 8–23)
CO2: 23 mmol/L (ref 22–32)
Calcium: 9.3 mg/dL (ref 8.9–10.3)
Chloride: 100 mmol/L (ref 98–111)
Creatinine, Ser: 0.74 mg/dL (ref 0.44–1.00)
GFR, Estimated: >60 mL/min (ref >60)
Glucose, Bld: 188 mg/dL — ABNORMAL HIGH (ref 70–99)
Potassium: 3.8 mmol/L (ref 3.5–5.1)
Sodium: 136 mmol/L (ref 135–145)
Total Bilirubin: 0.8 mg/dL (ref 0.3–1.2)
Total Protein: 7.5 g/dL (ref 6.5–8.1)

## 2021-10-10 LAB — URINALYSIS, MICROSCOPIC (REFLEX)

## 2021-10-10 LAB — CK: Total CK: 1234 U/L — ABNORMAL HIGH (ref 38–234)

## 2021-10-10 LAB — GLUCOSE, CAPILLARY: Glucose-Capillary: 109 mg/dL — ABNORMAL HIGH (ref 70–99)

## 2021-10-10 MED ORDER — SODIUM CHLORIDE 0.9 % IV BOLUS
500.0000 mL | Freq: Once | INTRAVENOUS | Status: AC
  Administered 2021-10-10: 500 mL via INTRAVENOUS

## 2021-10-10 MED ORDER — LOSARTAN POTASSIUM 25 MG PO TABS
100.0000 mg | ORAL_TABLET | Freq: Every day | ORAL | Status: DC
  Administered 2021-10-10 – 2021-10-12 (×3): 100 mg via ORAL
  Filled 2021-10-10 (×3): qty 4

## 2021-10-10 MED ORDER — ENOXAPARIN SODIUM 40 MG/0.4ML IJ SOSY
40.0000 mg | PREFILLED_SYRINGE | INTRAMUSCULAR | Status: DC
  Administered 2021-10-10 – 2021-10-15 (×6): 40 mg via SUBCUTANEOUS
  Filled 2021-10-10 (×6): qty 0.4

## 2021-10-10 MED ORDER — SODIUM CHLORIDE 0.9 % IV SOLN
INTRAVENOUS | Status: DC
Start: 2021-10-10 — End: 2021-10-12

## 2021-10-10 MED ORDER — SERTRALINE HCL 50 MG PO TABS
50.0000 mg | ORAL_TABLET | Freq: Every day | ORAL | Status: DC
  Administered 2021-10-10 – 2021-10-15 (×6): 50 mg via ORAL
  Filled 2021-10-10 (×6): qty 1

## 2021-10-10 MED ORDER — ALPRAZOLAM 0.25 MG PO TABS
0.2500 mg | ORAL_TABLET | Freq: Every evening | ORAL | Status: DC | PRN
  Administered 2021-10-11 – 2021-10-15 (×5): 0.25 mg via ORAL
  Filled 2021-10-10 (×5): qty 1

## 2021-10-10 MED ORDER — SODIUM CHLORIDE 0.9 % IV BOLUS
1000.0000 mL | Freq: Once | INTRAVENOUS | Status: AC
  Administered 2021-10-10: 1000 mL via INTRAVENOUS

## 2021-10-10 MED ORDER — ACETAMINOPHEN 500 MG PO TABS
500.0000 mg | ORAL_TABLET | Freq: Three times a day (TID) | ORAL | Status: DC
  Administered 2021-10-10 – 2021-10-16 (×15): 500 mg via ORAL
  Filled 2021-10-10 (×16): qty 1

## 2021-10-10 MED ORDER — SODIUM CHLORIDE 0.9 % IV SOLN
1.0000 g | Freq: Once | INTRAVENOUS | Status: AC
  Administered 2021-10-10: 1 g via INTRAVENOUS
  Filled 2021-10-10: qty 10

## 2021-10-10 MED ORDER — INSULIN ASPART 100 UNIT/ML IJ SOLN
0.0000 [IU] | Freq: Three times a day (TID) | INTRAMUSCULAR | Status: DC
  Administered 2021-10-13: 1 [IU] via SUBCUTANEOUS

## 2021-10-10 MED ORDER — SODIUM CHLORIDE 0.9 % IV SOLN
1.0000 g | INTRAVENOUS | Status: DC
  Administered 2021-10-11 – 2021-10-12 (×2): 1 g via INTRAVENOUS
  Filled 2021-10-10 (×3): qty 10

## 2021-10-10 MED ORDER — SENNOSIDES-DOCUSATE SODIUM 8.6-50 MG PO TABS
1.0000 | ORAL_TABLET | Freq: Two times a day (BID) | ORAL | Status: DC
  Administered 2021-10-11 – 2021-10-16 (×7): 1 via ORAL
  Filled 2021-10-10 (×11): qty 1

## 2021-10-10 NOTE — Plan of Care (Signed)
Problem: Health Behavior/Discharge Planning: ?Goal: Ability to manage health-related needs will improve ?Outcome: Not Progressing ?  ?Problem: Pain Managment: ?Goal: General experience of comfort will improve ?Outcome: Progressing ?  ?Problem: Safety: ?Goal: Ability to remain free from injury will improve ?Outcome: Progressing ?  ?Ivan Anchors, RN ?10/10/21 ?8:16 PM ? ?

## 2021-10-10 NOTE — H&P (Addendum)
History and Physical    Marissa Ryan UEA:[id number] DOB: 1924-07-20 DOA: 10/10/2021  PCP: Rodrigo Ran, MD Patient coming from: home  Chief Complaint: Status post fall  HPI: Marissa Ryan is a 88 y.o. female with medical history significant of severe dementia lives alone found on the floor by family members.  Usually family checks on her frequently but family members had medical emergencies and they were not checking her as usual.  She was found on the floor with no pants on with urine odour noted.  She has chronic atrial fibrillation. Family was in the process of placing her in [facility] assisted living. History obtained from the ER records.  Patient is unable to provide any history. Tried calling daughter-in-law and granddaughter with no response.  Vital signs when they picked her up blood pressure 180/90 pulse is 84 respiration 18 saturation 99% on room air blood glucose was 199. ED Course: She was found to have a CPK of 1234, white count 14.5, CT head and CT C-spine with no acute findings, UA consistent with UTI and dehydration, subacute pelvic fracture and T11 fracture.  She was given IV fluids and Rocephin. Chest x-ray shows no focal consolidation or fluid overload X-ray of the knee shows tricompartmental arthritis X-ray of the lumbar spine chronic L1 compression fracture T11 compression fracture fracture of the right superior pubic ramus likely chronic Fracture of the right superior inferior pubic rami new since 2015 but appear chronic.  No convincing acute fracture noted. In the ER blood pressure was 159/127 with a pulse of 126 and respiration of 25 99% on room air  Review of Systems: As per HPI otherwise all other systems reviewed and are negative  Ambulatory Status:unknown  Past Medical History:  Diagnosis Date   Anxiety    Arthritis    Cancer (HCC)    hx of skin cancer removal on hand    Colitis    Diabetes mellitus without complication (HCC)    Gallstones     Hyperlipidemia    Hypertension    IBS (irritable bowel syndrome)    Osteoporosis     Past Surgical History:  Procedure Laterality Date   APPENDECTOMY     BACK SURGERY     x2   cataract surgery      bilateral    CHOLECYSTECTOMY  1999   EYE SURGERY     for glaucoma    left eye peeling surgery      left femur bone surgery      1978   REVERSE SHOULDER ARTHROPLASTY Right 10/10/2014   Procedure: RIGHT REVERSE SHOULDER ARTHROPLASTY;  Surgeon: Francena Hanly, MD;  Location: [facility] OR;  Service: Orthopedics;  Laterality: Right;   ROTATOR CUFF REPAIR     TOTAL HIP ARTHROPLASTY Left 02/20/2014   Procedure: LEFT TOTAL HIP ARTHROPLASTY ANTERIOR APPROACH;  Surgeon: Loanne Drilling, MD;  Location: [facility];  Service: Orthopedics;  Laterality: Left;   WRIST SURGERY     left     Social History   Socioeconomic History   Marital status: Married    Spouse name: Not on file   Number of children: Not on file   Years of education: Not on file   Highest education level: Not on file  Occupational History   Not on file  Tobacco Use   Smoking status: Never   Smokeless tobacco: Never  Vaping Use   Vaping Use: Never used  Substance and Sexual Activity   Alcohol use: Yes  Comment: rare glass of wine    Drug use: No   Sexual activity: Not on file  Other Topics Concern   Not on file  Social History Narrative   3 cups of coffee a day   Social Determinants of Health   Financial Resource Strain: Not on file  Food Insecurity: Not on file  Transportation Needs: Not on file  Physical Activity: Not on file  Stress: Not on file  Social Connections: Not on file  Intimate Partner Violence: Not on file    Allergies  Allergen Reactions   Robaxin [Methocarbamol] Rash and Other (See Comments)    Red rash on right side of face and forehead cleared after benadryl given, per Florentina Addison, RN   Ancef [Cefazolin] Diarrhea   Morphine And Related Itching   Penicillins Itching    Tolerated Unasyn Has patient had a  PCN reaction causing immediate rash, facial/tongue/throat swelling, SOB or lightheadedness with hypotension: Yes Has patient had a PCN reaction causing severe rash involving mucus membranes or skin necrosis: No Has patient had a PCN reaction that required hospitalization: Was in hospital Has patient had a PCN reaction occurring within the last 10 years: No If all of the above answers are "NO", then may proceed with Cephalosporin use.    Tape Other (See Comments)    Bandages and leads cause bruising and weeping at sites   Flurbiprofen Diarrhea    Family History  Problem Relation Age of Onset   Stroke Mother    Hypertension Mother    Ovarian cancer Sister       Prior to Admission medications   Medication Sig Start Date End Date Taking? Authorizing Provider  acetaminophen (TYLENOL) 500 MG tablet Take 1 tablet (500 mg total) by mouth 3 (three) times daily. 06/23/20   Regalado, Belkys A, MD  ALPRAZolam (XANAX) 0.25 MG tablet Take 1 tablet (0.25 mg total) by mouth at bedtime as needed for sleep. 06/23/20   Regalado, Jon Billings A, MD  aspirin EC 81 MG tablet Take 81 mg by mouth daily as needed for mild pain (or sinus pressure).     [provider]  B Complex Vitamins (VITAMIN-B COMPLEX PO) Take 1 tablet by mouth 2 (two) times a week.    [provider]  docusate sodium 100 MG CAPS Take 100 mg by mouth 2 (two) times daily. Patient taking differently: Take 100 mg by mouth at bedtime.  02/25/14   Perkins, Alexzandrew L, PA-C  gabapentin (NEURONTIN) 300 MG capsule Take 300 mg by mouth See admin instructions. Take 300 mg by mouth in the morning & at bedtime and an additional 300 mg during the day as needed for pain    [provider]  losartan (COZAAR) 100 MG tablet Take 100 mg by mouth at bedtime.     [provider]  MELATONIN PO Take 1 tablet by mouth at bedtime.    [provider]  metFORMIN (GLUCOPHAGE) 500 MG tablet Take 500 mg by mouth at bedtime.      [provider]  mirtazapine (REMERON) 15 MG tablet Take 15 mg by mouth at bedtime.    [provider]  Multiple Vitamins-Minerals (PRESERVISION AREDS 2) CAPS Take 1 capsule by mouth daily with breakfast.    [provider]  oxyCODONE-acetaminophen (PERCOCET) 10-325 MG tablet Take 0.5-1 tablets by mouth every 6 (six) hours as needed. 06/24/20   Regalado, Belkys A, MD  Polyethyl Glyc-Propyl Glyc PF (SYSTANE ULTRA PF) 0.4-0.3 % SOLN Place 1-2 drops  into both eyes 3 (three) times daily as needed (for irritation).     [provider]  polyethylene glycol (MIRALAX / GLYCOLAX) 17 g packet Take 17 g by mouth 2 (two) times daily. 06/23/20   Regalado, Belkys A, MD  senna-docusate (SENOKOT-S) 8.6-50 MG tablet Take 1 tablet by mouth 2 (two) times daily. 06/23/20   Regalado, Belkys A, MD  sertraline (ZOLOFT) 50 MG tablet Take 50 mg by mouth at bedtime.  08/27/17   [provider]  SIMPLY SLEEP 25 MG tablet Take 25 mg by mouth at bedtime as needed for sleep.    [provider]  sodium chloride (MURO 128) 5 % ophthalmic ointment Place 1 application into the right eye at bedtime.    [provider]    Physical Exam: Vitals:   10/10/21 1424 10/10/21 1432 10/10/21 1500 10/10/21 1600  BP:   (!) 168/90 (!) 159/127  Pulse:   93 77  Resp:   (!) 22 18  Temp:  98.6 F (37 C)    TempSrc:  Rectal    SpO2:   100% 99%  Weight: 63.5 kg     Height: 5\' 2"  (1.575 m)        General:  Appears frail elderly  Eyes:  PERRL, EOMI, normal lids, iris ENT:  grossly normal hearing, lips & tongue, oral mucosa dry Neck:  no LAD, masses or thyromegaly Cardiovascular:  RRR, no m/r/g. No LE edema.  Respiratory:  CTA bilaterally, no w/r/r. Normal respiratory effort. Abdomen:  soft, ntnd, NABS Skin:  no rash or induration seen on limited exam Musculoskeletal:  grossly normal tone BUE/BLE, good ROM, no bony abnormality Psychiatric:  grossly normal mood and affect, speech  fluent and appropriate, AOx3 Neurologic: confused  Labs on Admission: I have personally reviewed following labs and imaging studies  CBC: Recent Labs  Lab 10/10/21 1434  WBC 14.5*  NEUTROABS 12.4*  HGB 14.9  HCT 44.4  MCV 94.3  PLT 341   Basic Metabolic Panel: Recent Labs  Lab 10/10/21 1434  NA 136  K 3.8  CL 100  CO2 23  GLUCOSE 188*  BUN 19  CREATININE 0.74  CALCIUM 9.3   GFR: Estimated Creatinine Clearance: 37.7 mL/min (by C-G formula based on SCr of 0.74 mg/dL). Liver Function Tests: Recent Labs  Lab 10/10/21 1434  AST 59*  ALT 28  ALKPHOS 50  BILITOT 0.8  PROT 7.5  ALBUMIN 3.8   No results for input(s): LIPASE, AMYLASE in the last 168 hours. No results for input(s): AMMONIA in the last 168 hours. Coagulation Profile: No results for input(s): INR, PROTIME in the last 168 hours. Cardiac Enzymes: Recent Labs  Lab 10/10/21 1434  CKTOTAL 1,234*   BNP (last 3 results) No results for input(s): PROBNP in the last 8760 hours. HbA1C: No results for input(s): HGBA1C in the last 72 hours. CBG: No results for input(s): GLUCAP in the last 168 hours. Lipid Profile: No results for input(s): CHOL, HDL, LDLCALC, TRIG, CHOLHDL, LDLDIRECT in the last 72 hours. Thyroid Function Tests: No results for input(s): TSH, T4TOTAL, FREET4, T3FREE, THYROIDAB in the last 72 hours. Anemia Panel: No results for input(s): VITAMINB12, FOLATE, FERRITIN, TIBC, IRON, RETICCTPCT in the last 72 hours. Urine analysis:    Component Value Date/Time   COLORURINE YELLOW 10/10/2021 1434   APPEARANCEUR CLOUDY (A) 10/10/2021 1434   LABSPEC >1.030 (H) 10/10/2021 1434   PHURINE 6.0 10/10/2021 1434   GLUCOSEU NEGATIVE 10/10/2021 1434   HGBUR MODERATE (A) 10/10/2021  1434   BILIRUBINUR NEGATIVE 10/10/2021 1434   KETONESUR 40 (A) 10/10/2021 1434   PROTEINUR 100 (A) 10/10/2021 1434   UROBILINOGEN 0.2 10/12/2014 0315   NITRITE NEGATIVE 10/10/2021 1434   LEUKOCYTESUR SMALL (A) 10/10/2021 1434     Creatinine Clearance: Estimated Creatinine Clearance: 37.7 mL/min (by C-G formula based on SCr of 0.74 mg/dL).  Sepsis Labs: @LABRCNTIP (procalcitonin:4,lacticidven:4) )No results found for this or any previous visit (from the past 240 hour(s)).   Radiological Exams on Admission: DG Chest 2 View  Result Date: 10/10/2021 CLINICAL DATA:  Altered mental status EXAM: CHEST - 2 VIEW COMPARISON:  11/26/2017 FINDINGS: Transverse diameter of heart is slightly increased. Thoracic aorta is tortuous and ectatic. There is interval increase in interstitial markings in the parahilar regions and lower lung fields. There is no significant pleural effusion or pneumothorax. There is previous reverse arthroplasty in the right shoulder. IMPRESSION: Increased interstitial markings are seen in the both parahilar regions and lower lung fields suggesting scarring or interstitial pneumonia. Part of this finding may suggest underlying scarring. There are no signs of alveolar pulmonary edema or focal pulmonary consolidation. Electronically Signed   By: Ernie Avena M.D.   On: 10/10/2021 15:45   DG Lumbar Spine 2-3 Views  Result Date: 10/10/2021 CLINICAL DATA:  Found on floor at home by family. Has not been seen for a few days. History of dementia. EXAM: LUMBAR SPINE - 2-3 VIEW COMPARISON:  06/12/2018 FINDINGS: Moderate compression deformity of T11, which was present previously but appears increased in severity. Mild compression deformity of L1, stable. Remaining visualized vertebra are normal in height. No bone lesions. Grade 1 anterolisthesis of L5 on S1.  No other spondylolisthesis. Moderate loss of disc height at T12-L1, L2-L3, L3-L4 L4-L5. Marked loss of disc height at L5-S1. Endplate osteophytes noted throughout the visualized spine. Skeletal structures are diffusely demineralized. Mild curvature, convex the left in the upper lumbar spine and to the right in the lower lumbar spine. Partly imaged left hip total  arthroplasty appears well seated and aligned. Partly imaged right superior pubic ramus fracture is new when compared to the prior radiographs, but of unclear chronicity. IMPRESSION: 1. Partly imaged fracture of the right superior pubic ramus is new from the radiographs dated 06/12/2018, but is likely chronic. 2. Interval increase in the severity of the T11 compression fracture, moderate severity. 3. No other evidence of acute/recent abnormality. Mild chronic compression fracture of L1. 4. Stable degenerative changes as detailed. Electronically Signed   By: Amie Portland M.D.   On: 10/10/2021 15:50   DG Pelvis 1-2 Views  Result Date: 10/10/2021 CLINICAL DATA:  Found at home on the floor. EXAM: PELVIS - 1-2 VIEW COMPARISON:  Pelvis radiograph, 02/20/2014. FINDINGS: There are fractures of the right superior and inferior pubic rami, new since the prior exam, the with a chronic appearance. No other fractures. No bone lesions. Left total hip arthroplasty appears well seated and aligned. Chronic sclerosis noted at the pubic symphysis with mild sclerosis adjacent to the SI joints. SI joints and right hip joint are normally aligned. Pubic symphysis normally aligned. Skeletal structures are demineralized. Soft tissues demonstrate arterial vascular calcifications. No acute findings. IMPRESSION: 1. Fractures of the right superior inferior pubic rami new since the 2015 exam, but with a chronic appearance. No convincing acute fracture or acute abnormality. Electronically Signed   By: Amie Portland M.D.   On: 10/10/2021 15:52   CT Head Wo Contrast  Result Date: 10/10/2021 CLINICAL DATA:  Found down, neck  trauma EXAM: CT HEAD WITHOUT CONTRAST CT CERVICAL SPINE WITHOUT CONTRAST TECHNIQUE: Multidetector CT imaging of the head and cervical spine was performed following the standard protocol without intravenous contrast. Multiplanar CT image reconstructions of the cervical spine were also generated. RADIATION DOSE REDUCTION:  This exam was performed according to the departmental dose-optimization program which includes automated exposure control, adjustment of the mA and/or kV according to patient size and/or use of iterative reconstruction technique. COMPARISON:  06/20/2020 FINDINGS: CT HEAD FINDINGS Brain: No evidence of acute infarction, hemorrhage, hydrocephalus, extra-axial collection or mass lesion/mass effect. Periventricular white matter hypodensity. Vascular: No hyperdense vessel or unexpected calcification. Skull: Hyperostosis frontalis. Negative for fracture or focal lesion. Sinuses/Orbits: No acute finding. Other: None. CT CERVICAL SPINE FINDINGS Alignment: Normal. Skull base and vertebrae: No acute fracture. Subtle inferior endplate wedge deformities of T1 and T2 are unchanged (series 8, image 29). No primary bone lesion or focal pathologic process. Soft tissues and spinal canal: No prevertebral fluid or swelling. No visible canal hematoma. Disc levels: Severe disc space height loss and osteophytosis from C5 through C7, with otherwise mild to moderate disc space height loss. Upper chest: Negative. Other: None. IMPRESSION: 1. No acute intracranial pathology. Small-vessel white matter disease. 2. No fracture or static subluxation of the cervical spine. 3. Subtle inferior endplate wedge deformities of T1 and T2 are unchanged. 4. Severe disc space height loss and osteophytosis from C5 through C7. Electronically Signed   By: Jearld Lesch M.D.   On: 10/10/2021 15:58   CT Cervical Spine Wo Contrast  Result Date: 10/10/2021 CLINICAL DATA:  Found down, neck trauma EXAM: CT HEAD WITHOUT CONTRAST CT CERVICAL SPINE WITHOUT CONTRAST TECHNIQUE: Multidetector CT imaging of the head and cervical spine was performed following the standard protocol without intravenous contrast. Multiplanar CT image reconstructions of the cervical spine were also generated. RADIATION DOSE REDUCTION: This exam was performed according to the departmental  dose-optimization program which includes automated exposure control, adjustment of the mA and/or kV according to patient size and/or use of iterative reconstruction technique. COMPARISON:  06/20/2020 FINDINGS: CT HEAD FINDINGS Brain: No evidence of acute infarction, hemorrhage, hydrocephalus, extra-axial collection or mass lesion/mass effect. Periventricular white matter hypodensity. Vascular: No hyperdense vessel or unexpected calcification. Skull: Hyperostosis frontalis. Negative for fracture or focal lesion. Sinuses/Orbits: No acute finding. Other: None. CT CERVICAL SPINE FINDINGS Alignment: Normal. Skull base and vertebrae: No acute fracture. Subtle inferior endplate wedge deformities of T1 and T2 are unchanged (series 8, image 29). No primary bone lesion or focal pathologic process. Soft tissues and spinal canal: No prevertebral fluid or swelling. No visible canal hematoma. Disc levels: Severe disc space height loss and osteophytosis from C5 through C7, with otherwise mild to moderate disc space height loss. Upper chest: Negative. Other: None. IMPRESSION: 1. No acute intracranial pathology. Small-vessel white matter disease. 2. No fracture or static subluxation of the cervical spine. 3. Subtle inferior endplate wedge deformities of T1 and T2 are unchanged. 4. Severe disc space height loss and osteophytosis from C5 through C7. Electronically Signed   By: Jearld Lesch M.D.   On: 10/10/2021 15:58   DG Knee Complete 4 Views Right  Result Date: 10/10/2021 CLINICAL DATA:  Found on floor at home. EXAM: RIGHT KNEE - COMPLETE 4+ VIEW COMPARISON:  None. FINDINGS: No fracture or bone lesion. Moderate narrowing of the medial compartment. Mild narrowing of the lateral compartment. At least mild narrowing of the patellofemoral joint space compartment. Marginal osteophytes from all 3 compartments. Trace joint effusion.  Skeletal structures are demineralized. Arterial vascular calcifications noted posteriorly. Soft tissues  otherwise unremarkable. IMPRESSION: 1. No fracture or acute finding. 2. Tricompartmental osteoarthritis. Electronically Signed   By: Amie Portland M.D.   On: 10/10/2021 15:53    EKG: pending   Assessment/Plan Principal Problem:   Rhabdomyolysis     #1 uti UA consistent with UTI and dehydration.  Unable to get any history from the patient to know if she has any symptoms.  But patient was found on the floor by family members covered with urine and foul-smelling urine.  Patient received Rocephin in the ER we will continue the same.  Follow urinary culture.  #2 rhabdomyolysis patient was on the floor for unknown amount of time now with mild rhabdomyolysis with a CPK 1400.  We will continue IV fluids   #3 dehydration UA positive for ketones continue IV fluids.  #4  Multiple subacute to old fractures-T11 fracture subacute pelvic fracture-we will consult PT and OT. Pain control with Tylenol.  #5 leukocytosis with a white count of 14.5 likely from UTI.  #6 hypertension restart Cozaar add as needed hydralazine or metoprolol  #7 type 2 diabetes on metformin at home we will hold metformin and add sliding scale  #8 goals of care patient is DNR on admission.  Will discuss with family regarding palliative care  TOC consult  #9 malnutrition as evidenced by muscle wasting fat depletion due to chronic illness and dementia Consult dietary  RN Pressure Injury Documentation: Pressure Injury 10/14/17 Stage I -  Intact skin with non-blanchable redness of a localized area usually over a bony prominence. (Active)  10/14/17 1700  Location: Coccyx  Location Orientation: Posterior;Medial  Staging: Stage I -  Intact skin with non-blanchable redness of a localized area usually over a bony prominence.  Wound Description (Comments):   Present on Admission: Yes     Estimated body mass index is 25.61 kg/m as calculated from the following:   Height as of this encounter: 5\' 2"  (1.575 m).   Weight as of this  encounter: 63.5 kg.   DVT prophylaxis: Lovenox Code Status: DNR Family Communication: None at bedside tried calling family members listed with no response Disposition Plan: Likely will need placement Consults called: None Admission status: Observation   Alwyn Ren MD 10/10/2021, 4:47 PM

## 2021-10-10 NOTE — ED Notes (Signed)
Purple man not visible on ED end of epic.  Secure chat sent to Laurell Roof RN for report ? ?

## 2021-10-10 NOTE — ED Notes (Signed)
Patient transported to CT 

## 2021-10-10 NOTE — ED Provider Notes (Signed)
?[facility] DEPT ?Provider Note ? ? ?CSN: [id number] ?Arrival date & time: 10/10/21  1402 ? ?  ? ?History ? ?Chief Complaint  ?Patient presents with  ? Altered Mental Status  ? ? ?Marissa Ryan is a 88 y.o. female. ? ?Patient with history of severe dementia, lives alone, presents with unwitnessed fall.  Found on the ground last seen over a day ago.  Family had multiple emergencies and cannot check on her for the last 24 hours.  Otherwise unable to obtain any history from the patient herself due to history of dementia. ? ? ?  ? ?Home Medications ?Prior to Admission medications   ?Medication Sig Start Date End Date Taking? Authorizing Provider  ?acetaminophen (TYLENOL) 500 MG tablet Take 1 tablet (500 mg total) by mouth 3 (three) times daily. 06/23/20   Regalado, Belkys A, MD  ?ALPRAZolam (XANAX) 0.25 MG tablet Take 1 tablet (0.25 mg total) by mouth at bedtime as needed for sleep. 06/23/20   Regalado, Jerald Kief A, MD  ?aspirin EC 81 MG tablet Take 81 mg by mouth daily as needed for mild pain (or sinus pressure).     [provider]  ?B Complex Vitamins (VITAMIN-B COMPLEX PO) Take 1 tablet by mouth 2 (two) times a week.    [provider]  ?docusate sodium 100 MG CAPS Take 100 mg by mouth 2 (two) times daily. ?Patient taking differently: Take 100 mg by mouth at bedtime.  02/25/14   Perkins, Alexzandrew L, PA-C  ?gabapentin (NEURONTIN) 300 MG capsule Take 300 mg by mouth See admin instructions. Take 300 mg by mouth in the morning & at bedtime and an additional 300 mg during the day as needed for pain    [provider]  ?losartan (COZAAR) 100 MG tablet Take 100 mg by mouth at bedtime.     [provider]  ?MELATONIN PO Take 1 tablet by mouth at bedtime.    [provider]  ?metFORMIN (GLUCOPHAGE) 500 MG tablet Take 500 mg by mouth at bedtime.     [provider]  ?mirtazapine (REMERON) 15 MG tablet Take 15 mg by mouth at bedtime.    [provider]  ?Multiple Vitamins-Minerals (PRESERVISION AREDS 2) CAPS Take 1 capsule by mouth daily with breakfast.    [provider]  ?oxyCODONE-acetaminophen (PERCOCET) 10-325 MG tablet Take 0.5-1 tablets by mouth every 6 (six) hours as needed. 06/24/20   Regalado, Belkys A, MD  ?Polyethyl Glyc-Propyl Glyc PF (SYSTANE ULTRA PF) 0.4-0.3 % SOLN Place 1-2 drops into both eyes 3 (three) times daily as needed (for irritation).     [provider]  ?polyethylene glycol (MIRALAX / GLYCOLAX) 17 g packet Take 17 g by mouth 2 (two) times daily. 06/23/20   Regalado, Belkys A, MD  ?senna-docusate (SENOKOT-S) 8.6-50 MG tablet Take 1 tablet by mouth 2 (two) times daily. 06/23/20   Regalado, Belkys A, MD  ?sertraline (ZOLOFT) 50 MG tablet Take 50 mg by mouth at bedtime.  08/27/17   [provider]  ?SIMPLY SLEEP 25 MG tablet Take 25 mg by mouth at bedtime as needed for sleep.    [provider]  ?sodium chloride (MURO 128) 5 % ophthalmic ointment Place 1 application into the right eye at bedtime.    [provider]  ?   ? ?Allergies    ?Robaxin [methocarbamol], Ancef [cefazolin], Morphine and related, Penicillins, Tape, and Flurbiprofen   ? ?Review of Systems   ?Review of Systems  ?  Unable to perform ROS: Dementia  ? ?Physical Exam ?Updated Vital Signs ?BP (!) 159/127   Pulse 77   Temp 98.6 ?F (37 ?C) (Rectal)   Resp 18   Ht '5\' 2"'$  (1.575 m)   Wt 63.5 kg   SpO2 99%   BMI 25.61 kg/m?  ?Physical Exam ?Constitutional:   ?   General: She is not in acute distress. ?   Appearance: Normal appearance.  ?HENT:  ?   Head: Normocephalic.  ?   Nose: Nose normal.  ?Eyes:  ?   Extraocular Movements: Extraocular movements intact.  ?Cardiovascular:  ?   Rate and Rhythm: Normal rate.  ?Pulmonary:  ?   Effort: Pulmonary effort is normal.  ?Musculoskeletal:  ?   Cervical back: Normal range of motion.  ?   Comments: Patient maintained in C-spine precaution.  Able to range bilateral shoulders elbows  wrists and hips knees and ankles without any significant pain.  No gross deformity noted.  Neurovascular intact extremities.  Abrasion seen on the right knee, no laceration.  ?Neurological:  ?   General: No focal deficit present.  ?   Mental Status: She is alert. Mental status is at baseline.  ? ? ?ED Results / Procedures / Treatments   ?Labs ?(all labs ordered are listed, but only abnormal results are displayed) ?Labs Reviewed  ?COMPREHENSIVE METABOLIC PANEL - Abnormal; Notable for the following components:  ?    Result Value  ? Glucose, Bld 188 (*)   ? AST 59 (*)   ? All other components within normal limits  ?CBC WITH DIFFERENTIAL/PLATELET - Abnormal; Notable for the following components:  ? WBC 14.5 (*)   ? Neutro Abs 12.4 (*)   ? All other components within normal limits  ?URINALYSIS, ROUTINE W REFLEX MICROSCOPIC - Abnormal; Notable for the following components:  ? APPearance CLOUDY (*)   ? Specific Gravity, Urine >1.030 (*)   ? Hgb urine dipstick MODERATE (*)   ? Ketones, ur 40 (*)   ? Protein, ur 100 (*)   ? Leukocytes,Ua SMALL (*)   ? All other components within normal limits  ?CK - Abnormal; Notable for the following components:  ? Total CK 1,234 (*)   ? All other components within normal limits  ?URINALYSIS, MICROSCOPIC (REFLEX) - Abnormal; Notable for the following components:  ? Bacteria, UA MANY (*)   ? All other components within normal limits  ?RESP PANEL BY RT-PCR (FLU A&B, COVID) [id number]  ?URINE CULTURE  ? ? ?EKG ?None ? ?Radiology ?DG Chest 2 View ? ?Result Date: 10/10/2021 ?CLINICAL DATA:  Altered mental status EXAM: CHEST - 2 VIEW COMPARISON:  11/26/2017 FINDINGS: Transverse diameter of heart is slightly increased. Thoracic aorta is tortuous and ectatic. There is interval increase in interstitial markings in the parahilar regions and lower lung fields. There is no significant pleural effusion or pneumothorax. There is previous reverse arthroplasty in the right shoulder. IMPRESSION: Increased  interstitial markings are seen in the both parahilar regions and lower lung fields suggesting scarring or interstitial pneumonia. Part of this finding may suggest underlying scarring. There are no signs of alveolar pulmonary edema or focal pulmonary consolidation. Electronically Signed   By: Elmer Picker M.D.   On: 10/10/2021 15:45  ? ?DG Lumbar Spine 2-3 Views ? ?Result Date: 10/10/2021 ?CLINICAL DATA:  Found on floor at home by family. Has not been seen for a few days. History of dementia. EXAM: LUMBAR SPINE - 2-3 VIEW COMPARISON:  06/12/2018 FINDINGS: Moderate compression  deformity of T11, which was present previously but appears increased in severity. Mild compression deformity of L1, stable. Remaining visualized vertebra are normal in height. No bone lesions. Grade 1 anterolisthesis of L5 on S1.  No other spondylolisthesis. Moderate loss of disc height at T12-L1, L2-L3, L3-L4 L4-L5. Marked loss of disc height at L5-S1. Endplate osteophytes noted throughout the visualized spine. Skeletal structures are diffusely demineralized. Mild curvature, convex the left in the upper lumbar spine and to the right in the lower lumbar spine. Partly imaged left hip total arthroplasty appears well seated and aligned. Partly imaged right superior pubic ramus fracture is new when compared to the prior radiographs, but of unclear chronicity. IMPRESSION: 1. Partly imaged fracture of the right superior pubic ramus is new from the radiographs dated 06/12/2018, but is likely chronic. 2. Interval increase in the severity of the T11 compression fracture, moderate severity. 3. No other evidence of acute/recent abnormality. Mild chronic compression fracture of L1. 4. Stable degenerative changes as detailed. Electronically Signed   By: Lajean Manes M.D.   On: 10/10/2021 15:50  ? ?DG Pelvis 1-2 Views ? ?Result Date: 10/10/2021 ?CLINICAL DATA:  Found at home on the floor. EXAM: PELVIS - 1-2 VIEW COMPARISON:  Pelvis radiograph,  02/20/2014. FINDINGS: There are fractures of the right superior and inferior pubic rami, new since the prior exam, the with a chronic appearance. No other fractures. No bone lesions. Left total hip arthroplasty appears well se

## 2021-10-10 NOTE — ED Triage Notes (Signed)
Pt BIBA from home. Found on floor by family, not seen for a few days d/t scheduling. No obvious injuries, no signs of distress at home, arrives in c-collar. Hx dementia, alert to nothing. Lives alone. Found with no pants on, urine odor noted. Afib on ekg, family uncertain of hx. 03KV LF ? ?180/90 ?HR 84 ?RR 18 ?99% RA ?CBG 199 ?

## 2021-10-10 NOTE — Progress Notes (Signed)
Paged [name] for a diet order. ?

## 2021-10-10 NOTE — ED Notes (Signed)
Room changed and deleted the purple man, re-entered RN info/handoff ? ?

## 2021-10-11 ENCOUNTER — Encounter (HOSPITAL_COMMUNITY): Payer: Self-pay | Admitting: Internal Medicine

## 2021-10-11 ENCOUNTER — Other Ambulatory Visit: Payer: Self-pay

## 2021-10-11 DIAGNOSIS — Z96611 Presence of right artificial shoulder joint: Secondary | ICD-10-CM | POA: Diagnosis present

## 2021-10-11 DIAGNOSIS — Z66 Do not resuscitate: Secondary | ICD-10-CM | POA: Diagnosis present

## 2021-10-11 DIAGNOSIS — K5900 Constipation, unspecified: Secondary | ICD-10-CM | POA: Diagnosis not present

## 2021-10-11 DIAGNOSIS — F03C0 Unspecified dementia, severe, without behavioral disturbance, psychotic disturbance, mood disturbance, and anxiety: Secondary | ICD-10-CM | POA: Diagnosis present

## 2021-10-11 DIAGNOSIS — M808AXA Other osteoporosis with current pathological fracture, other site, initial encounter for fracture: Secondary | ICD-10-CM | POA: Diagnosis present

## 2021-10-11 DIAGNOSIS — E785 Hyperlipidemia, unspecified: Secondary | ICD-10-CM | POA: Diagnosis present

## 2021-10-11 DIAGNOSIS — Z7984 Long term (current) use of oral hypoglycemic drugs: Secondary | ICD-10-CM | POA: Diagnosis not present

## 2021-10-11 DIAGNOSIS — H548 Legal blindness, as defined in USA: Secondary | ICD-10-CM | POA: Diagnosis not present

## 2021-10-11 DIAGNOSIS — R4182 Altered mental status, unspecified: Secondary | ICD-10-CM | POA: Diagnosis not present

## 2021-10-11 DIAGNOSIS — M159 Polyosteoarthritis, unspecified: Secondary | ICD-10-CM | POA: Diagnosis not present

## 2021-10-11 DIAGNOSIS — E440 Moderate protein-calorie malnutrition: Secondary | ICD-10-CM | POA: Diagnosis present

## 2021-10-11 DIAGNOSIS — L89151 Pressure ulcer of sacral region, stage 1: Secondary | ICD-10-CM | POA: Diagnosis present

## 2021-10-11 DIAGNOSIS — E119 Type 2 diabetes mellitus without complications: Secondary | ICD-10-CM | POA: Diagnosis present

## 2021-10-11 DIAGNOSIS — Z823 Family history of stroke: Secondary | ICD-10-CM | POA: Diagnosis not present

## 2021-10-11 DIAGNOSIS — Z9049 Acquired absence of other specified parts of digestive tract: Secondary | ICD-10-CM | POA: Diagnosis not present

## 2021-10-11 DIAGNOSIS — Z20822 Contact with and (suspected) exposure to covid-19: Secondary | ICD-10-CM | POA: Diagnosis present

## 2021-10-11 DIAGNOSIS — Z7401 Bed confinement status: Secondary | ICD-10-CM | POA: Diagnosis not present

## 2021-10-11 DIAGNOSIS — R1310 Dysphagia, unspecified: Secondary | ICD-10-CM | POA: Diagnosis not present

## 2021-10-11 DIAGNOSIS — B9620 Unspecified Escherichia coli [E. coli] as the cause of diseases classified elsewhere: Secondary | ICD-10-CM | POA: Diagnosis present

## 2021-10-11 DIAGNOSIS — Z79899 Other long term (current) drug therapy: Secondary | ICD-10-CM | POA: Diagnosis not present

## 2021-10-11 DIAGNOSIS — W19XXXA Unspecified fall, initial encounter: Secondary | ICD-10-CM | POA: Diagnosis not present

## 2021-10-11 DIAGNOSIS — Z85828 Personal history of other malignant neoplasm of skin: Secondary | ICD-10-CM | POA: Diagnosis not present

## 2021-10-11 DIAGNOSIS — M6281 Muscle weakness (generalized): Secondary | ICD-10-CM | POA: Diagnosis not present

## 2021-10-11 DIAGNOSIS — Z8041 Family history of malignant neoplasm of ovary: Secondary | ICD-10-CM | POA: Diagnosis not present

## 2021-10-11 DIAGNOSIS — E860 Dehydration: Secondary | ICD-10-CM | POA: Diagnosis present

## 2021-10-11 DIAGNOSIS — R262 Difficulty in walking, not elsewhere classified: Secondary | ICD-10-CM | POA: Diagnosis not present

## 2021-10-11 DIAGNOSIS — R41841 Cognitive communication deficit: Secondary | ICD-10-CM | POA: Diagnosis not present

## 2021-10-11 DIAGNOSIS — N390 Urinary tract infection, site not specified: Secondary | ICD-10-CM | POA: Diagnosis present

## 2021-10-11 DIAGNOSIS — R29898 Other symptoms and signs involving the musculoskeletal system: Secondary | ICD-10-CM | POA: Diagnosis not present

## 2021-10-11 DIAGNOSIS — M6282 Rhabdomyolysis: Secondary | ICD-10-CM | POA: Diagnosis present

## 2021-10-11 DIAGNOSIS — M810 Age-related osteoporosis without current pathological fracture: Secondary | ICD-10-CM | POA: Diagnosis present

## 2021-10-11 DIAGNOSIS — F419 Anxiety disorder, unspecified: Secondary | ICD-10-CM | POA: Diagnosis not present

## 2021-10-11 DIAGNOSIS — F03C11 Unspecified dementia, severe, with agitation: Secondary | ICD-10-CM | POA: Diagnosis not present

## 2021-10-11 DIAGNOSIS — E0800 Diabetes mellitus due to underlying condition with hyperosmolarity without nonketotic hyperglycemic-hyperosmolar coma (NKHHC): Secondary | ICD-10-CM | POA: Diagnosis not present

## 2021-10-11 DIAGNOSIS — M4856XD Collapsed vertebra, not elsewhere classified, lumbar region, subsequent encounter for fracture with routine healing: Secondary | ICD-10-CM | POA: Diagnosis not present

## 2021-10-11 DIAGNOSIS — Z8249 Family history of ischemic heart disease and other diseases of the circulatory system: Secondary | ICD-10-CM | POA: Diagnosis not present

## 2021-10-11 DIAGNOSIS — M4854XD Collapsed vertebra, not elsewhere classified, thoracic region, subsequent encounter for fracture with routine healing: Secondary | ICD-10-CM | POA: Diagnosis not present

## 2021-10-11 DIAGNOSIS — I482 Chronic atrial fibrillation, unspecified: Secondary | ICD-10-CM | POA: Diagnosis present

## 2021-10-11 DIAGNOSIS — I9589 Other hypotension: Secondary | ICD-10-CM | POA: Diagnosis present

## 2021-10-11 DIAGNOSIS — I10 Essential (primary) hypertension: Secondary | ICD-10-CM | POA: Diagnosis present

## 2021-10-11 LAB — COMPREHENSIVE METABOLIC PANEL
ALT: 25 U/L (ref 0–44)
AST: 49 U/L — ABNORMAL HIGH (ref 15–41)
Albumin: 3.1 g/dL — ABNORMAL LOW (ref 3.5–5.0)
Alkaline Phosphatase: 41 U/L (ref 38–126)
Anion gap: 9 (ref 5–15)
BUN: 19 mg/dL (ref 8–23)
CO2: 22 mmol/L (ref 22–32)
Calcium: 8.1 mg/dL — ABNORMAL LOW (ref 8.9–10.3)
Chloride: 108 mmol/L (ref 98–111)
Creatinine, Ser: 0.77 mg/dL (ref 0.44–1.00)
GFR, Estimated: >60 mL/min (ref >60)
Glucose, Bld: 153 mg/dL — ABNORMAL HIGH (ref 70–99)
Potassium: 3 mmol/L — ABNORMAL LOW (ref 3.5–5.1)
Sodium: 139 mmol/L (ref 135–145)
Total Bilirubin: 0.6 mg/dL (ref 0.3–1.2)
Total Protein: 5.8 g/dL — ABNORMAL LOW (ref 6.5–8.1)

## 2021-10-11 LAB — GLUCOSE, CAPILLARY
Glucose-Capillary: 120 mg/dL — ABNORMAL HIGH (ref 70–99)
Glucose-Capillary: 154 mg/dL — ABNORMAL HIGH (ref 70–99)
Glucose-Capillary: 162 mg/dL — ABNORMAL HIGH (ref 70–99)
Glucose-Capillary: 139 mg/dL — ABNORMAL HIGH (ref 70–99)

## 2021-10-11 LAB — HEMOGLOBIN A1C
Hgb A1c MFr Bld: 6.8 % — ABNORMAL HIGH (ref 4.8–5.6)
Mean Plasma Glucose: 148.46 mg/dL

## 2021-10-11 LAB — CK: Total CK: 914 U/L — ABNORMAL HIGH (ref 38–234)

## 2021-10-11 NOTE — Progress Notes (Signed)
?PROGRESS NOTE ? ? ? ?Marissa Ryan  GGY:[id number] DOB: 1924/09/23 DOA: 10/10/2021 ?PCP: Crist Infante, MD  ? ? ?Brief Narrative: Marissa Ryan is a 88 y.o. female with medical history significant of severe dementia lives alone found on the floor by family members.  Usually family checks on her frequently but family members had medical emergencies and they were not checking her as usual.  She was found on the floor with no pants on with urine odour noted.  She has chronic atrial fibrillation. ?Family was in the process of placing [facility] in [facility]. ? ?  ?Vital signs when they picked her up blood pressure 180/90 pulse is 84 respiration 18 saturation 99% on room air blood glucose was 199. ?ED Course: She was found to have a CPK of 1234, white count 14.5, CT head and CT C-spine with no acute findings, UA consistent with UTI and dehydration, subacute pelvic fracture and T11 fracture.  She was given IV fluids and Rocephin. ?Chest x-ray shows no focal consolidation or fluid overload ?X-ray of the knee shows tricompartmental arthritis ?X-ray of the lumbar spine chronic L1 compression fracture T11 compression fracture fracture of the right superior pubic ramus likely chronic ?Fracture of the right superior inferior pubic rami new since 2015 but appear chronic.  No convincing acute fracture noted. ?In the ER blood pressure was 159/127 with a pulse of 126 and respiration of 25 99% on room air ? ?Discussed with grandson 10/11/2021 [name] who will be the point of contact.  His number is [phone].  They were planning to place her in a [facility] assisted living.  They would like her to go to skilled nursing facility at [facility]  For long-term care. ? ?Assessment & Plan: ?  ?Principal Problem: ?  Rhabdomyolysis ? ? ?#1 uti UA consistent with UTI and dehydration.  Unable to get any history from the patient to know if she has any symptoms. patient was found on the floor by family members covered with urine  and foul-smelling urine.  Patient received Rocephin in the ER we will continue the same.  Follow urinary culture. ?  ?#2 rhabdomyolysis patient was on the floor for unknown amount of time now with mild rhabdomyolysis with a CPK 914 from 1234. ? We will continue IV fluids  ?  ?#3 dehydration UA positive for ketones continue IV fluids. ?  ?#4  Multiple subacute to old fractures-T11 fracture subacute pelvic fracture-we will consult PT and OT. ?Pain control with Tylenol. ?  ?#5 leukocytosis with a white count of 14.5 likely from UTI. ?  ?#6 hypertension restart Cozaar add as needed hydralazine or metoprolol ?  ?#7 type 2 diabetes on metformin at home we will hold metformin and add sliding scale ?  ?#8 goals of care patient is DNR on admission.  Will discuss with family regarding palliative care  ?TOC consult ?  ?#9 malnutrition as evidenced by muscle wasting fat depletion due to chronic illness and dementia ?Consult dietary ? ?Pressure Injury 10/14/17 Stage I -  Intact skin with non-blanchable redness of a localized area usually over a bony prominence. (Active)  ?10/14/17 1700  ?Location: Coccyx  ?Location Orientation: Posterior;Medial  ?Staging: Stage I -  Intact skin with non-blanchable redness of a localized area usually over a bony prominence.  ?Wound Description (Comments):   ?Present on Admission: Yes  ? ? ?Estimated body mass index is 25.61 kg/m? as calculated from the following: ?  Height as of this encounter:  $'5\' 2"'R$  (1.575 m). ?  Weight as of this encounter: 63.5 kg. ? ?DVT prophylaxis:  ?Code Status:  ?Family Communication:  ?Disposition Plan:  Status is: Inpatient ?Remains inpatient appropriate because: uti dehydration on IV antibiotics UTI ?  ?Consultants:  ?None ? ?Procedures: None ?Antimicrobials: Rocephin ? ?Subjective: ? ?Patient resting in bed pleasantly confused ?Objective: ?Vitals:  ? 10/10/21 1806 10/10/21 2236 10/11/21 0128 10/11/21 9381  ?BP: (!) 136/103 138/71 (!) 152/79 (!) 170/78  ?Pulse: (!) 105  96 85 81  ?Resp: '16 14 14 18  '$ ?Temp: 99 ?F (37.2 ?C) 97.7 ?F (36.5 ?C) 98 ?F (36.7 ?C) 97.7 ?F (36.5 ?C)  ?TempSrc:    Oral  ?SpO2: 94% 97% 96% 98%  ?Weight:      ?Height:      ? ? ?Intake/Output Summary (Last 24 hours) at 10/11/2021 1213 ?Last data filed at 10/11/2021 1000 ?Gross per 24 hour  ?Intake 1258.9 ml  ?Output 75 ml  ?Net 1183.9 ml  ? ?Filed Weights  ? 10/10/21 1424  ?Weight: 63.5 kg  ? ? ?Examination: ? ?General exam: Appears pleasant confused  ?respiratory system: Clear to auscultation. Respiratory effort normal. ?Cardiovascular system: S1 & S2 heard, RRR. No JVD, murmurs, rubs, gallops or clicks. No pedal edema. ?Gastrointestinal system: Abdomen is nondistended, soft and nontender. No organomegaly or masses felt. Normal bowel sounds heard. ?Central nervous system: Pleasantly confused. ?Extremities: Symmetric 5 x 5 power. ?Skin: No rashes, lesions or ulcers ?Psychiatry: Unable to assess ? ? ? ?Data Reviewed: I have personally reviewed following labs and imaging studies ? ?CBC: ?Recent Labs  ?Lab 10/10/21 ?1434  ?WBC 14.5*  ?NEUTROABS 12.4*  ?HGB 14.9  ?HCT 44.4  ?MCV 94.3  ?PLT 341  ? ?Basic Metabolic Panel: ?Recent Labs  ?Lab 10/10/21 ?1434 10/11/21 ?0325  ?NA 136 139  ?K 3.8 3.0*  ?CL 100 108  ?CO2 23 22  ?GLUCOSE 188* 153*  ?BUN 19 19  ?CREATININE 0.74 0.77  ?CALCIUM 9.3 8.1*  ? ?GFR: ?Estimated Creatinine Clearance: 37.7 mL/min (by C-G formula based on SCr of 0.77 mg/dL). ?Liver Function Tests: ?Recent Labs  ?Lab 10/10/21 ?1434 10/11/21 ?0325  ?AST 59* 49*  ?ALT 28 25  ?ALKPHOS 50 41  ?BILITOT 0.8 0.6  ?PROT 7.5 5.8*  ?ALBUMIN 3.8 3.1*  ? ?No results for input(s): LIPASE, AMYLASE in the last 168 hours. ?No results for input(s): AMMONIA in the last 168 hours. ?Coagulation Profile: ?No results for input(s): INR, PROTIME in the last 168 hours. ?Cardiac Enzymes: ?Recent Labs  ?Lab 10/10/21 ?1434 10/11/21 ?0325  ?CKTOTAL 1,234* 914*  ? ?BNP (last 3 results) ?No results for input(s): PROBNP in the last 8760  hours. ?HbA1C: ?Recent Labs  ?  10/11/21 ?0325  ?HGBA1C 6.8*  ? ?CBG: ?Recent Labs  ?Lab 10/10/21 ?2254 10/11/21 ?0903 10/11/21 ?1130  ?GLUCAP 109* 120* 154*  ? ?Lipid Profile: ?No results for input(s): CHOL, HDL, LDLCALC, TRIG, CHOLHDL, LDLDIRECT in the last 72 hours. ?Thyroid Function Tests: ?No results for input(s): TSH, T4TOTAL, FREET4, T3FREE, THYROIDAB in the last 72 hours. ?Anemia Panel: ?No results for input(s): VITAMINB12, FOLATE, FERRITIN, TIBC, IRON, RETICCTPCT in the last 72 hours. ?Sepsis Labs: ?No results for input(s): PROCALCITON, LATICACIDVEN in the last 168 hours. ? ?No results found for this or any previous visit (from the past 240 hour(s)).  ? ? ? ? ? ?Radiology Studies: ?DG Chest 2 View ? ?Result Date: 10/10/2021 ?CLINICAL DATA:  Altered mental status EXAM: CHEST - 2 VIEW COMPARISON:  11/26/2017 FINDINGS: Transverse  diameter of heart is slightly increased. Thoracic aorta is tortuous and ectatic. There is interval increase in interstitial markings in the parahilar regions and lower lung fields. There is no significant pleural effusion or pneumothorax. There is previous reverse arthroplasty in the right shoulder. IMPRESSION: Increased interstitial markings are seen in the both parahilar regions and lower lung fields suggesting scarring or interstitial pneumonia. Part of this finding may suggest underlying scarring. There are no signs of alveolar pulmonary edema or focal pulmonary consolidation. Electronically Signed   By: Elmer Picker M.D.   On: 10/10/2021 15:45  ? ?DG Lumbar Spine 2-3 Views ? ?Result Date: 10/10/2021 ?CLINICAL DATA:  Found on floor at home by family. Has not been seen for a few days. History of dementia. EXAM: LUMBAR SPINE - 2-3 VIEW COMPARISON:  06/12/2018 FINDINGS: Moderate compression deformity of T11, which was present previously but appears increased in severity. Mild compression deformity of L1, stable. Remaining visualized vertebra are normal in height. No bone  lesions. Grade 1 anterolisthesis of L5 on S1.  No other spondylolisthesis. Moderate loss of disc height at T12-L1, L2-L3, L3-L4 L4-L5. Marked loss of disc height at L5-S1. Endplate osteophytes noted throughou

## 2021-10-11 NOTE — Progress Notes (Signed)
Patient confused, pulling at lines. Urine output 75 mL overnight. Bladder scan at 6:25 was 235 mL. ?

## 2021-10-11 NOTE — Plan of Care (Signed)
  Problem: Education: Goal: Knowledge of General Education information will improve Description: Including pain rating scale, medication(s)/side effects and non-pharmacologic comfort measures Outcome: Progressing   Problem: Activity: Goal: Risk for activity intolerance will decrease Outcome: Progressing   Problem: Pain Managment: Goal: General experience of comfort will improve Outcome: Progressing   

## 2021-10-11 NOTE — Evaluation (Signed)
Physical Therapy Evaluation ?Patient Details ?Name: Marissa Ryan ?MRN: [id number] ?DOB: 1925-04-06 ?Today's Date: 10/11/2021 ? ?History of Present Illness ? Pt admitted from home after being found on floor.  CT showing L1 and L11 compression fxs and pelvic fxs but all likely chronic.  Pt with hx of DM, osteoporosis, R reverse TSR, L THR, chronic a-fib and dementia  ?Clinical Impression ? Pt admitted as above and presenting with functional mobility limitations 2* generalized weakness, balance deficits, and poor safety awareness.  Pt is at high risk of falls and would benefit from follow up at SNF level to maximize IND and safety unless family is able to arrange 24/7 assist at home. ?   ? ?Recommendations for follow up therapy are one component of a multi-disciplinary discharge planning process, led by the attending physician.  Recommendations may be updated based on patient status, additional functional criteria and insurance authorization. ? ?Follow Up Recommendations Skilled nursing-short term rehab (<3 hours/day) (unless family can support 24/7 assist) ? ?  ?Assistance Recommended at Discharge Frequent or constant Supervision/Assistance  ?Patient can return home with the following ? A little help with walking and/or transfers;A little help with bathing/dressing/bathroom;Assistance with cooking/housework;Direct supervision/assist for medications management;Direct supervision/assist for financial management;Assist for transportation;Help with stairs or ramp for entrance ? ?  ?Equipment Recommendations None recommended by PT (TBD, pt unsure of home equipment available)  ?Recommendations for Other Services ?    ?  ?Functional Status Assessment Patient has had a recent decline in their functional status and demonstrates the ability to make significant improvements in function in a reasonable and predictable amount of time.  ? ?  ?Precautions / Restrictions Precautions ?Precautions: Fall ?Restrictions ?Weight Bearing  Restrictions: No  ? ?  ? ?Mobility ? Bed Mobility ?Overal bed mobility: Needs Assistance ?Bed Mobility: Supine to Sit, Rolling ?Rolling: Min assist ?  ?Supine to sit: Min assist ?  ?  ?General bed mobility comments: assist to bring trunk to upright and complete rotation to EOB sitting ?  ? ?Transfers ?Overall transfer level: Needs assistance ?Equipment used: Rolling walker (2 wheels) ?Transfers: Sit to/from Stand ?Sit to Stand: Min assist, Mod assist ?  ?  ?  ?  ?  ?General transfer comment: cues for use of UEs to self assist and physcial assist to bring wt up and fwd and to balance in standing ?  ? ?Ambulation/Gait ?Ambulation/Gait assistance: Min assist ?Gait Distance (Feet): 100 Feet (and 15' into bathroom) ?Assistive device: Rolling walker (2 wheels) ?Gait Pattern/deviations: Step-through pattern, Decreased step length - right, Decreased step length - left, Shuffle, Trunk flexed ?Gait velocity: cues to slow for safety ?  ?  ?General Gait Details: cues for posture, position from RW and safety awareness ? ?Stairs ?  ?  ?  ?  ?  ? ?Wheelchair Mobility ?  ? ?Modified Rankin (Stroke Patients Only) ?  ? ?  ? ?Balance Overall balance assessment: Needs assistance ?Sitting-balance support: No upper extremity supported, Feet supported ?Sitting balance-Leahy Scale: Fair ?  ?  ?Standing balance support: Bilateral upper extremity supported ?Standing balance-Leahy Scale: Poor ?  ?  ?  ?  ?  ?  ?  ?  ?  ?  ?  ?  ?   ? ? ? ?Pertinent Vitals/Pain Pain Assessment ?Pain Assessment: No/denies pain  ? ? ?Home Living Family/patient expects to be discharged to:: Unsure ?Living Arrangements: Alone ?  ?  ?  ?  ?  ?  ?  ?  ?Additional  Comments: Per chart, pt lives alone and family checks on her but have been unable to do so recently bc of emergency  ?  ?Prior Function Prior Level of Function : Independent/Modified Independent ?  ?  ?  ?  ?  ?  ?Mobility Comments: Pt asking repeatedly for cane ?  ?  ? ? ?Hand Dominance  ? Dominant Hand:  Left ? ?  ?Extremity/Trunk Assessment  ? Upper Extremity Assessment ?Upper Extremity Assessment: Overall WFL for tasks assessed ?  ? ?Lower Extremity Assessment ?Lower Extremity Assessment: Generalized weakness ?  ? ?Cervical / Trunk Assessment ?Cervical / Trunk Assessment: Kyphotic  ?Communication  ? Communication: HOH  ?Cognition Arousal/Alertness: Awake/alert ?Behavior During Therapy: Impulsive, Anxious, Restless ?Overall Cognitive Status: History of cognitive impairments - at baseline ?  ?  ?  ?  ?  ?  ?  ?  ?  ?  ?  ?  ?  ?  ?  ?  ?  ?  ?  ? ?  ?General Comments   ? ?  ?Exercises    ? ?Assessment/Plan  ?  ?PT Assessment Patient needs continued PT services  ?PT Problem List Decreased strength;Decreased range of motion;Decreased activity tolerance;Decreased balance;Decreased mobility;Decreased knowledge of use of DME;Pain;Other (comment);Decreased safety awareness;Decreased cognition ? ?   ?  ?PT Treatment Interventions DME instruction;Gait training;Stair training;Functional mobility training;Therapeutic activities;Therapeutic exercise;Balance training;Patient/family education   ? ?PT Goals (Current goals can be found in the Care Plan section)  ?Acute Rehab PT Goals ?Patient Stated Goal: I have to get out of here, my people are waiting ?PT Goal Formulation: Patient unable to participate in goal setting ?Time For Goal Achievement: 10/25/21 ?Potential to Achieve Goals: Fair ? ?  ?Frequency Min 3X/week ?  ? ? ?Co-evaluation   ?  ?  ?  ?  ? ? ?  ?AM-PAC PT "6 Clicks" Mobility  ?Outcome Measure Help needed turning from your back to your side while in a flat bed without using bedrails?: A Little ?Help needed moving from lying on your back to sitting on the side of a flat bed without using bedrails?: A Little ?Help needed moving to and from a bed to a chair (including a wheelchair)?: A Little ?Help needed standing up from a chair using your arms (e.g., wheelchair or bedside chair)?: A Little ?Help needed to walk in  hospital room?: A Little ?Help needed climbing 3-5 steps with a railing? : A Lot ?6 Click Score: 17 ? ?  ?End of Session Equipment Utilized During Treatment: Gait belt ?Activity Tolerance: Patient tolerated treatment well ?Patient left: in chair;with call bell/phone within reach;with chair alarm set;with nursing/sitter in room ?Nurse Communication: Mobility status ?PT Visit Diagnosis: Unsteadiness on feet (R26.81);Muscle weakness (generalized) (M62.81);Difficulty in walking, not elsewhere classified (R26.2) ?  ? ?Time: 0488-8916 ?PT Time Calculation (min) (ACUTE ONLY): 33 min ? ? ?Charges:   PT Evaluation ?$PT Eval Low Complexity: 1 Low ?PT Treatments ?$Gait Training: 8-22 mins ?  ?   ? ? ?Debe Coder PT ?Acute Rehabilitation Services ?Pager [phone] ?Office [phone] ? ? ?Waymond Meador ?10/11/2021, 1:09 PM ? ?

## 2021-10-11 NOTE — Plan of Care (Signed)
  Problem: Coping: Goal: Level of anxiety will decrease Outcome: Progressing   Problem: Pain Managment: Goal: General experience of comfort will improve Outcome: Progressing   

## 2021-10-12 ENCOUNTER — Encounter (HOSPITAL_COMMUNITY): Payer: Self-pay | Admitting: Internal Medicine

## 2021-10-12 DIAGNOSIS — M6282 Rhabdomyolysis: Secondary | ICD-10-CM | POA: Diagnosis not present

## 2021-10-12 LAB — COMPREHENSIVE METABOLIC PANEL
ALT: 24 U/L (ref 0–44)
AST: 39 U/L (ref 15–41)
Albumin: 2.7 g/dL — ABNORMAL LOW (ref 3.5–5.0)
Alkaline Phosphatase: 34 U/L — ABNORMAL LOW (ref 38–126)
Anion gap: 7 (ref 5–15)
BUN: 15 mg/dL (ref 8–23)
CO2: 20 mmol/L — ABNORMAL LOW (ref 22–32)
Calcium: 7.3 mg/dL — ABNORMAL LOW (ref 8.9–10.3)
Chloride: 109 mmol/L (ref 98–111)
Creatinine, Ser: 0.55 mg/dL (ref 0.44–1.00)
GFR, Estimated: >60 mL/min (ref >60)
Glucose, Bld: 106 mg/dL — ABNORMAL HIGH (ref 70–99)
Potassium: 2.9 mmol/L — ABNORMAL LOW (ref 3.5–5.1)
Sodium: 136 mmol/L (ref 135–145)
Total Bilirubin: 0.4 mg/dL (ref 0.3–1.2)
Total Protein: 5.1 g/dL — ABNORMAL LOW (ref 6.5–8.1)

## 2021-10-12 LAB — CBC
HCT: 33.5 % — ABNORMAL LOW (ref 36.0–46.0)
Hemoglobin: 10.8 g/dL — ABNORMAL LOW (ref 12.0–15.0)
MCH: 31.6 pg (ref 26.0–34.0)
MCHC: 32.2 g/dL (ref 30.0–36.0)
MCV: 98 fL (ref 80.0–100.0)
Platelets: 259 10*3/uL (ref 150–400)
RBC: 3.42 MIL/uL — ABNORMAL LOW (ref 3.87–5.11)
RDW: 12.5 % (ref 11.5–15.5)
WBC: 9.3 10*3/uL (ref 4.0–10.5)
nRBC: 0 % (ref 0.0–0.2)

## 2021-10-12 LAB — GLUCOSE, CAPILLARY
Glucose-Capillary: 106 mg/dL — ABNORMAL HIGH (ref 70–99)
Glucose-Capillary: 138 mg/dL — ABNORMAL HIGH (ref 70–99)
Glucose-Capillary: 138 mg/dL — ABNORMAL HIGH (ref 70–99)
Glucose-Capillary: 158 mg/dL — ABNORMAL HIGH (ref 70–99)

## 2021-10-12 MED ORDER — POTASSIUM CHLORIDE 10 MEQ/100ML IV SOLN
10.0000 meq | INTRAVENOUS | Status: AC
  Administered 2021-10-12 (×4): 10 meq via INTRAVENOUS
  Filled 2021-10-12 (×4): qty 100

## 2021-10-12 MED ORDER — POTASSIUM CHLORIDE CRYS ER 20 MEQ PO TBCR
40.0000 meq | EXTENDED_RELEASE_TABLET | Freq: Three times a day (TID) | ORAL | Status: AC
  Administered 2021-10-12: 40 meq via ORAL
  Filled 2021-10-12 (×2): qty 2

## 2021-10-12 MED ORDER — ENSURE ENLIVE PO LIQD
237.0000 mL | Freq: Two times a day (BID) | ORAL | Status: DC
  Administered 2021-10-12 – 2021-10-16 (×8): 237 mL via ORAL

## 2021-10-12 MED ORDER — POTASSIUM CHLORIDE IN NACL 40-0.9 MEQ/L-% IV SOLN
INTRAVENOUS | Status: DC
  Filled 2021-10-12 (×4): qty 1000

## 2021-10-12 MED ORDER — ADULT MULTIVITAMIN W/MINERALS CH
1.0000 | ORAL_TABLET | Freq: Every day | ORAL | Status: DC
  Administered 2021-10-12 – 2021-10-16 (×5): 1 via ORAL
  Filled 2021-10-12 (×5): qty 1

## 2021-10-12 NOTE — Progress Notes (Signed)
?PROGRESS NOTE ? ? ? ?Marissa Ryan  UVO:[id number] DOB: Apr 12, 1925 DOA: 10/10/2021 ?PCP: Crist Infante, MD  ? ? ?Brief Narrative: Marissa Ryan is a 88 y.o. female with medical history significant of severe dementia lives alone found on the floor by family members.  Usually family checks on her frequently but family members had medical emergencies and they were not checking her as usual.  She was found on the floor with no pants on with urine odour noted.  She has chronic atrial fibrillation. ?Family was in the process of placing her in [facility] living. ? ?  ?Vital signs when they picked her up blood pressure 180/90 pulse is 84 respiration 18 saturation 99% on room air blood glucose was 199. ?ED Course: She was found to have a CPK of 1234, white count 14.5, CT head and CT C-spine with no acute findings, UA consistent with UTI and dehydration, subacute pelvic fracture and T11 fracture.  She was given IV fluids and Rocephin. ?Chest x-ray shows no focal consolidation or fluid overload ?X-ray of the knee shows tricompartmental arthritis ?X-ray of the lumbar spine chronic L1 compression fracture T11 compression fracture fracture of the right superior pubic ramus likely chronic ?Fracture of the right superior inferior pubic rami new since 2015 but appear chronic.  No convincing acute fracture noted. ?In the ER blood pressure was 159/127 with a pulse of 126 and respiration of 25 99% on room air ? ?Discussed with grandson 10/11/2021 [name] who will be the point of contact.  His number is [phone].  They were planning to place her in a [facility] assisted living.  They would like her to go to skilled nursing facility at [facility] home  For long-term care. ? ?Assessment & Plan: ?  ?Principal Problem: ?  Rhabdomyolysis ? ? ?#1 uti UA consistent with UTI and dehydration.  Urine culture with pansensitive E. coli.  Continue IV Rocephin.   ?  ?#2 rhabdomyolysis patient was on the floor for unknown amount of time now  with mild rhabdomyolysis with a CPK 914 from 1234. ? We will continue IV fluids  ?  ?#3 dehydration UA positive for ketones continue IV fluids. ?  ?#4  Multiple subacute to old fractures-T11 fracture subacute pelvic fracture-we will consult PT and OT. ?Pain control with Tylenol. ?  ?#5 leukocytosis resolved  ? ?#6 hypertension restart Cozaar add as needed hydralazine or metoprolol ?  ?#7 type 2 diabetes on metformin at home we will hold metformin and add sliding scale ?  ?#8 goals of care patient is DNR on admission.  Will discuss with family regarding palliative care  ?TOC consulted for placement to [facility] with long-term care ?  ?#9 malnutrition as evidenced by muscle wasting fat depletion due to chronic illness and dementia ?Consult dietary ? ?Pressure Injury 10/14/17 Stage I -  Intact skin with non-blanchable redness of a localized area usually over a bony prominence. (Active)  ?10/14/17 1700  ?Location: Coccyx  ?Location Orientation: Posterior;Medial  ?Staging: Stage I -  Intact skin with non-blanchable redness of a localized area usually over a bony prominence.  ?Wound Description (Comments):   ?Present on Admission: Yes  ? ? ?Estimated body mass index is 25.61 kg/m? as calculated from the following: ?  Height as of this encounter: '5\' 2"'$  (1.575 m). ?  Weight as of this encounter: 63.5 kg. ? ?DVT prophylaxis:  ?Code Status:  ?Family Communication:  ?Disposition Plan:  Status is: Inpatient await placement ?Remains inpatient appropriate  because: uti dehydration on IV antibiotics UTI ?  ?Consultants:  ?None ? ?Procedures: None ?Antimicrobials: Rocephin ? ?Subjective: ? ?Patient resting in bed  ?Now has hearing aid in place ?denies any complaints ? ?Pleasant objective: ?Vitals:  ? 10/11/21 0605 10/11/21 1342 10/11/21 2144 10/12/21 0604  ?BP: (!) 170/78 (!) 164/82 (!) 152/74 (!) 157/71  ?Pulse: 81 77 73 65  ?Resp: '18 16 16 17  '$ ?Temp: 97.7 ?F (36.5 ?C) 98.6 ?F (37 ?C) 97.9 ?F (36.6 ?C) 97.9 ?F (36.6 ?C)   ?TempSrc: Oral Oral    ?SpO2: 98% 100% 100% 99%  ?Weight:      ?Height:      ? ? ?Intake/Output Summary (Last 24 hours) at 10/12/2021 1330 ?Last data filed at 10/12/2021 1215 ?Gross per 24 hour  ?Intake 1720.96 ml  ?Output 300 ml  ?Net 1420.96 ml  ? ? ?Filed Weights  ? 10/10/21 1424  ?Weight: 63.5 kg  ? ? ?Examination: ? ?General exam: Appears pleasant confused  ?respiratory system: Clear to auscultation. Respiratory effort normal. ?Cardiovascular system: S1 & S2 heard, RRR. No JVD, murmurs, rubs, gallops or clicks. No pedal edema. ?Gastrointestinal system: Abdomen is nondistended, soft and nontender. No organomegaly or masses felt. Normal bowel sounds heard. ?Central nervous system: Pleasantly confused. ?Extremities: Symmetric 5 x 5 power. ?Skin: No rashes, lesions or ulcers ?Psychiatry: Unable to assess ? ? ? ?Data Reviewed: I have personally reviewed following labs and imaging studies ? ?CBC: ?Recent Labs  ?Lab 10/10/21 ?1434 10/12/21 ?0322  ?WBC 14.5* 9.3  ?NEUTROABS 12.4*  --   ?HGB 14.9 10.8*  ?HCT 44.4 33.5*  ?MCV 94.3 98.0  ?PLT 341 259  ? ? ?Basic Metabolic Panel: ?Recent Labs  ?Lab 10/10/21 ?1434 10/11/21 ?0325 10/12/21 ?0322  ?NA 136 139 136  ?K 3.8 3.0* 2.9*  ?CL 100 108 109  ?CO2 23 22 20*  ?GLUCOSE 188* 153* 106*  ?BUN '19 19 15  '$ ?CREATININE 0.74 0.77 0.55  ?CALCIUM 9.3 8.1* 7.3*  ? ? ?GFR: ?Estimated Creatinine Clearance: 37.7 mL/min (by C-G formula based on SCr of 0.55 mg/dL). ?Liver Function Tests: ?Recent Labs  ?Lab 10/10/21 ?1434 10/11/21 ?0325 10/12/21 ?0322  ?AST 59* 49* 39  ?ALT '28 25 24  '$ ?ALKPHOS 50 41 34*  ?BILITOT 0.8 0.6 0.4  ?PROT 7.5 5.8* 5.1*  ?ALBUMIN 3.8 3.1* 2.7*  ? ? ?No results for input(s): LIPASE, AMYLASE in the last 168 hours. ?No results for input(s): AMMONIA in the last 168 hours. ?Coagulation Profile: ?No results for input(s): INR, PROTIME in the last 168 hours. ?Cardiac Enzymes: ?Recent Labs  ?Lab 10/10/21 ?1434 10/11/21 ?0325  ?CKTOTAL 1,234* 914*  ? ? ?BNP (last 3  results) ?No results for input(s): PROBNP in the last 8760 hours. ?HbA1C: ?Recent Labs  ?  10/11/21 ?0325  ?HGBA1C 6.8*  ? ? ?CBG: ?Recent Labs  ?Lab 10/11/21 ?1130 10/11/21 ?1622 10/11/21 ?2146 10/12/21 ?0749 10/12/21 ?1153  ?GLUCAP 154* 162* 139* 106* 138*  ? ? ?Lipid Profile: ?No results for input(s): CHOL, HDL, LDLCALC, TRIG, CHOLHDL, LDLDIRECT in the last 72 hours. ?Thyroid Function Tests: ?No results for input(s): TSH, T4TOTAL, FREET4, T3FREE, THYROIDAB in the last 72 hours. ?Anemia Panel: ?No results for input(s): VITAMINB12, FOLATE, FERRITIN, TIBC, IRON, RETICCTPCT in the last 72 hours. ?Sepsis Labs: ?No results for input(s): PROCALCITON, LATICACIDVEN in the last 168 hours. ? ?No results found for this or any previous visit (from the past 240 hour(s)).  ? ? ? ? ? ?Radiology Studies: ?DG Chest 2 View ? ?  Result Date: 10/10/2021 ?CLINICAL DATA:  Altered mental status EXAM: CHEST - 2 VIEW COMPARISON:  11/26/2017 FINDINGS: Transverse diameter of heart is slightly increased. Thoracic aorta is tortuous and ectatic. There is interval increase in interstitial markings in the parahilar regions and lower lung fields. There is no significant pleural effusion or pneumothorax. There is previous reverse arthroplasty in the right shoulder. IMPRESSION: Increased interstitial markings are seen in the both parahilar regions and lower lung fields suggesting scarring or interstitial pneumonia. Part of this finding may suggest underlying scarring. There are no signs of alveolar pulmonary edema or focal pulmonary consolidation. Electronically Signed   By: Elmer Picker M.D.   On: 10/10/2021 15:45  ? ?DG Lumbar Spine 2-3 Views ? ?Result Date: 10/10/2021 ?CLINICAL DATA:  Found on floor at home by family. Has not been seen for a few days. History of dementia. EXAM: LUMBAR SPINE - 2-3 VIEW COMPARISON:  06/12/2018 FINDINGS: Moderate compression deformity of T11, which was present previously but appears increased in severity. Mild  compression deformity of L1, stable. Remaining visualized vertebra are normal in height. No bone lesions. Grade 1 anterolisthesis of L5 on S1.  No other spondylolisthesis. Moderate loss of disc height at T12-L1, L2-L3, L3

## 2021-10-12 NOTE — Progress Notes (Signed)
Physical Therapy Treatment ?Patient Details ?Name: Marissa Ryan ?MRN: [id number] ?DOB: [birth year] ?Today's Date: 10/12/2021 ? ? ?History of Present Illness Pt admitted from home after being found on floor.  CT showing L1 and L11 compression fxs and pelvic fxs but all likely chronic.  Pt with hx of DM, osteoporosis, R reverse TSR, L THR, chronic a-fib and dementia ? ?  ?PT Comments  ? ? Pt progressing slowly, requiring incr assist with bed mobility and decr participation overall today, mostly d/t cognitive deficits.  Continue to recommend SNF to maximize independence and safety  ?Recommendations for follow up therapy are one component of a multi-disciplinary discharge planning process, led by the attending physician.  Recommendations may be updated based on patient status, additional functional criteria and insurance authorization. ? ?Follow Up Recommendations ? Skilled nursing-short term rehab (<3 hours/day) ?  ?  ?Assistance Recommended at Discharge Frequent or constant Supervision/Assistance  ?Patient can return home with the following A little help with walking and/or transfers;A little help with bathing/dressing/bathroom;Assistance with cooking/housework;Direct supervision/assist for medications management;Direct supervision/assist for financial management;Assist for transportation;Help with stairs or ramp for entrance ?  ?Equipment Recommendations ? Other (comment) (defer to SNF)  ?  ?Recommendations for Other Services   ? ? ?  ?Precautions / Restrictions Precautions ?Precautions: Fall ?Restrictions ?Weight Bearing Restrictions: No  ?  ? ?Mobility ? Bed Mobility ?Overal bed mobility: Needs Assistance ?Bed Mobility: Rolling, Supine to Sit, Sit to Supine ?Rolling: Min assist ?  ?Supine to sit: Min assist, Mod assist, +2 for safety/equipment ?Sit to supine: Max assist, +2 for safety/equipment, +2 for physical assistance ?  ?General bed mobility comments: assist to bring trunk to upright and complete rotation to EOB  sitting; pt insisting on sitting EOB. +2 assist to return to supine as pt would no participate with transition and d/t cognitive deficits unable to reason with pt that it was not safe to remain EOB without assist ?  ? ?Transfers ?  ?  ?  ?  ?  ?  ?  ?  ?  ?General transfer comment: pt refused ?  ? ?Ambulation/Gait ?  ?  ?  ?  ?  ?  ?  ?  ? ? ?Stairs ?  ?  ?  ?  ?  ? ? ?Wheelchair Mobility ?  ? ?Modified Rankin (Stroke Patients Only) ?  ? ? ?  ?Balance   ?Sitting-balance support: No upper extremity supported, Feet unsupported ?Sitting balance-Leahy Scale: Fair ?  ?  ?  ?  ?  ?  ?  ?  ?  ?  ?  ?  ?  ?  ?  ?  ?  ? ?  ?Cognition Arousal/Alertness: Awake/alert ?Behavior During Therapy: Impulsive, Anxious, Restless ?Overall Cognitive Status: History of cognitive impairments - at baseline ?  ?  ?  ?  ?  ?  ?  ?  ?  ?  ?  ?  ?  ?  ?  ?  ?General Comments: easily agitated by activity, voices, etc ?  ?  ? ?  ?Exercises   ? ?  ?General Comments   ?  ?  ? ?Pertinent Vitals/Pain Pain Assessment ?Pain Assessment: No/denies pain  ? ? ?Home Living   ?  ?  ?  ?  ?  ?  ?  ?  ?  ?   ?  ?Prior Function    ?  ?  ?   ? ?PT Goals (current goals can now  be found in the care plan section) Acute Rehab PT Goals ?Patient Stated Goal: "you are too choppy" ?PT Goal Formulation: Patient unable to participate in goal setting ?Time For Goal Achievement: 10/25/21 ?Potential to Achieve Goals: Fair ?Progress towards PT goals: Progressing toward goals (slowly) ? ?  ?Frequency ? ? ? Min 3X/week ? ? ? ?  ?PT Plan Current plan remains appropriate  ? ? ?Co-evaluation   ?  ?  ?  ?  ? ?  ?AM-PAC PT "6 Clicks" Mobility   ?Outcome Measure ? Help needed turning from your back to your side while in a flat bed without using bedrails?: A Lot ?Help needed moving from lying on your back to sitting on the side of a flat bed without using bedrails?: A Lot ?Help needed moving to and from a bed to a chair (including a wheelchair)?: A Lot ?Help needed standing up from a  chair using your arms (e.g., wheelchair or bedside chair)?: Total ?Help needed to walk in hospital room?: Total ?Help needed climbing 3-5 steps with a railing? : Total ?6 Click Score: 9 ? ?  ?End of Session Equipment Utilized During Treatment: Gait belt ?Activity Tolerance: Patient tolerated treatment well ?Patient left: in bed;with call bell/phone within reach;with bed alarm set ?Nurse Communication: Mobility status ?PT Visit Diagnosis: Unsteadiness on feet (R26.81);Muscle weakness (generalized) (M62.81);Difficulty in walking, not elsewhere classified (R26.2) ?  ? ? ?Time: 2505-3976 ?PT Time Calculation (min) (ACUTE ONLY): 13 min ? ?Charges:  $Therapeutic Activity: 8-22 mins          ?          ? Baxter Flattery, PT ? ?Acute Rehab Dept [facility]) [phone] ?Pager [phone] ? ?10/12/2021 ? ? ? ? [place] ?10/12/2021, 11:22 AM ? ?

## 2021-10-12 NOTE — Progress Notes (Signed)
Initial Nutrition Assessment ? ?DOCUMENTATION CODES:  ? ?Non-severe (moderate) malnutrition in context of chronic illness ? ?INTERVENTION:  ?- will order Ensure Plus High Protein TID, each supplement provides 350 kcal and 20 grams of protein. ?- will order Magic Cup TID with meals, each supplement provides 290 kcal and 9 grams of protein. ?- will order 1 tablet Multivitamin with minerals daily ?- liberalize diet from Heart Healthy/Carb Modified to Regular.  ? ? ?NUTRITION DIAGNOSIS:  ? ?Moderate Malnutrition related to chronic illness (advanced dementia) as evidenced by moderate fat depletion, moderate muscle depletion, severe muscle depletion. ? ?GOAL:  ? ?Patient will meet greater than or equal to 90% of their needs ? ?MONITOR:  ? ?PO intake, Supplement acceptance, Labs, Weight trends ? ?REASON FOR ASSESSMENT:  ? ?Malnutrition Screening Tool, Consult ?Poor PO ? ?ASSESSMENT:  ? ?88 y.o. female with medical history of severe dementia, osteoporosis, HTN, arthritis, HLD, IBS, DM, anxiety, afib, and cancer. She lives alone and family checks on her frequently, but were unable recently due to medical emergencies. They were in the process of placing her at [facility]. She was found on the floor with no pants on and to have a urine odor present. ? ?Patient noted to be a/o to self only. She was sleeping at the time of RD visit. Lunch was untouched at bedside. Patient did not respond to any questions asked by RD. During NFPE she stated x2 "that does not help anything". ? ?Able to talk with RN who shares that patient has reported not liking the taste of foods and that patient ate bites of breakfast and refused lunch. Patient has does well with sweet things like applesauce mixed with jelly and sips of Ensure throughout the day.  ? ?She has not been seen by a [place] RD at any time in the past.  ? ?Weight on 3/25 was 140 lb and the most recently documented weight PTA was 117 lb on 01/29/20. ? ? ?Labs reviewed; CBGs:  106 and 138 mg/dl, K: 2.9 mmol/l, Ca: 7.3 mg/dl. ? ?Medications reviewed; sliding scale novolog, 40 mEq Klor-Con x3 doses 3/27, 1 tablet senokot BID.  ? ?IVF; NS '@75'$  ml/hr. ?  ? ?NUTRITION - FOCUSED PHYSICAL EXAM: ? ?Flowsheet Row Most Recent Value  ?Orbital Region Mild depletion  ?Upper Arm Region Moderate depletion  ?Thoracic and Lumbar Region Unable to assess  ?Buccal Region Moderate depletion  ?Temple Region Moderate depletion  ?Clavicle Bone Region Severe depletion  ?Clavicle and Acromion Bone Region Severe depletion  ?Scapular Bone Region Severe depletion  ?Dorsal Hand Moderate depletion  ?Patellar Region Moderate depletion  ?Anterior Thigh Region Moderate depletion  ?Posterior Calf Region Moderate depletion  ?Edema (RD Assessment) None  ?Hair Reviewed  ?Eyes Unable to assess  ?Mouth Unable to assess  ?Skin Reviewed  ?Nails Reviewed  ? ?  ? ? ?Diet Order:   ?Diet Order   ? ?       ?  Diet regular Room service appropriate? Yes; Fluid consistency: Thin  Diet effective now       ?  ? ?  ?  ? ?  ? ? ?EDUCATION NEEDS:  ? ?No education needs have been identified at this time ? ?Skin:  Skin Assessment: Reviewed RN Assessment ? ?Last BM:  3/26 (type 6 + type 7) ? ?Height:  ? ?Ht Readings from Last 1 Encounters:  ?10/10/21 '5\' 2"'$  (1.575 m)  ? ? ?Weight:  ? ?Wt Readings from Last 1 Encounters:  ?10/10/21 63.5  kg  ? ? ?BMI:  Body mass index is 25.61 kg/m?. ? ?Estimated Nutritional Needs:  ?Kcal:  1400-1600 kcal ?Protein:  70-80 grams ?Fluid:  >/= 1.5 L/day ? ? ? ? ?Jarome Matin, MS, RD, LDN ?Registered Dietitian II ?Inpatient Clinical Nutrition ?RD pager # and on-call/weekend pager # available in [place]  ? ?

## 2021-10-12 NOTE — TOC Initial Note (Signed)
Transition of Care (TOC) - Initial/Assessment Note  ? ? ?Patient Details  ?Name: NAJAT OLAZABAL ?MRN: [id number] ?Date of Birth: 02-20-1925 ? ?Transition of Care (TOC) CM/SW Contact:    ?Wofford Stratton, LCSW ?Phone Number: ?10/12/2021, 3:59 PM ? ?Clinical Narrative:                 ?Have spoken with pt's son, [name], today to review dc planning needs.  (Pt not fully oriented).  Son reports that, prior to this admission, pt was scheduled to admit to an [place] apt at [facility].  He is uncertain how the current situation might affect those plans for [place].   ?Have spoken with Laurie Panda at [facility] ([phone]) who is now reviewing pt's chart and making determination if an ALF admit is possible or, as therapies have recommended, may need to change plan to SNF rehab. Unfortunately, there are no open beds today at SNF rehab at [facility] (and only have rehab beds at [facility]).  Plan to see how pt does with therapy tomorrow and will reconnect with son and Cecille Rubin to determine most appropriate plan. ? ?Expected Discharge Plan: [place] (vs. ALF) ?Barriers to Discharge: Continued Medical Work up ? ? ?Patient Goals and CMS Choice ?  ?  ?  ? ?Expected Discharge Plan and Services ?Expected Discharge Plan: [place] (vs. ALF) ?In-house Referral: Clinical Social Work ?  ?  ?Living arrangements for the past 2 months: [place] ?                ?  ?  ?  ?  ?  ?  ?  ?  ?  ?  ? ?Prior Living Arrangements/Services ?Living arrangements for the past 2 months: [place] ?  ?Patient language and need for interpreter reviewed:: Yes ?Do you feel safe going back to the place where you live?: Yes      ?Need for Family Participation in Patient Care: No (Comment) ?Care giver support system in place?: Yes (comment) ?  ?Criminal Activity/Legal Involvement Pertinent to Current Situation/Hospitalization: No - Comment as needed ? ?Activities of Daily Living ?Home Assistive  Devices/Equipment: Other (Comment) (unable to assess) ?ADL Screening (condition at time of admission) ?Patient's cognitive ability adequate to safely complete daily activities?: No ?Is the patient deaf or have difficulty hearing?: Yes ?Does the patient have difficulty seeing, even when wearing glasses/contacts?: Yes ?Does the patient have difficulty concentrating, remembering, or making decisions?: Yes ?Patient able to express need for assistance with ADLs?: No ?Does the patient have difficulty dressing or bathing?: Yes ?Independently performs ADLs?: No ?Communication: Dependent ?Dressing (OT): Dependent ?Is this a change from baseline?:  (unknown) ?Grooming: Dependent ?Is this a change from baseline?:  (unknown) ?Feeding: Dependent ?Is this a change from baseline?:  (unknown) ?Bathing: Dependent ?Is this a change from baseline?:  (unknown) ?Toileting: Dependent ?Is this a change from baseline?:  (unknown) ?In/Out Bed: Dependent ?Is this a change from baseline?:  (unknown) ?Walks in Home:  (unknown) ?Does the patient have difficulty walking or climbing stairs?: Yes ?Weakness of Legs: Both ?Weakness of Arms/Hands: Both ? ?Permission Sought/Granted ?Permission sought to share information with : Family Supports ?Permission granted to share information with : Yes, Verbal Permission Granted ? Share Information with NAME: Cammy Brochure ?   ? Permission granted to share info w Relationship: son ? Permission granted to share info w Contact Information: [phone] ? ?Emotional Assessment ?Appearance:: Appears stated age ?Attitude/Demeanor/Rapport: Karen Kays  to Assess ?Affect (typically observed): Unable to Assess ?  ?Alcohol / Substance Use: Not Applicable ?Psych Involvement: No (comment) ? ?Admission diagnosis:  Rhabdomyolysis [M62.82] ?Generalized weakness [R53.1] ?Non-traumatic rhabdomyolysis [M62.82] ?Urinary tract infection without hematuria, site unspecified [N39.0] ?Patient Active Problem List  ? Diagnosis Date Noted   ? Rhabdomyolysis 10/10/2021  ? Fracture of multiple pubic rami, right, closed, initial encounter ([place]) 06/20/2020  ? Pressure injury of skin 10/15/2017  ? Abdominal pain 10/14/2017  ? IBS (irritable bowel syndrome) 10/14/2017  ? Anxiety 10/14/2017  ? Anemia 10/14/2017  ? 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 OA (osteoarthritis) of hip 02/20/2014  ? Essential hypertension 12/03/2013  ? Hyperlipidemia 12/03/2013  ? Diabetes ([place]) 12/03/2013  ? ?PCP:  Crist Infante, MD ?Pharmacy:   ?[facility] ([facility]), [place] [facility] [place] ?[place] ?[place] [address] ?Phone: [phone] Fax: [phone] ? ? ? ? ?Social Determinants of Health (SDOH) Interventions ?  ? ?Readmission Risk Interventions ?   ? View : No data to display.  ?  ?  ?  ? ? ? ?

## 2021-10-13 DIAGNOSIS — M6282 Rhabdomyolysis: Secondary | ICD-10-CM | POA: Diagnosis not present

## 2021-10-13 DIAGNOSIS — E440 Moderate protein-calorie malnutrition: Secondary | ICD-10-CM | POA: Insufficient documentation

## 2021-10-13 LAB — CBC
HCT: 35.5 % — ABNORMAL LOW (ref 36.0–46.0)
Hemoglobin: 11.6 g/dL — ABNORMAL LOW (ref 12.0–15.0)
MCH: 31.6 pg (ref 26.0–34.0)
MCHC: 32.7 g/dL (ref 30.0–36.0)
MCV: 96.7 fL (ref 80.0–100.0)
Platelets: 282 10*3/uL (ref 150–400)
RBC: 3.67 MIL/uL — ABNORMAL LOW (ref 3.87–5.11)
RDW: 12.5 % (ref 11.5–15.5)
WBC: 9.4 10*3/uL (ref 4.0–10.5)
nRBC: 0 % (ref 0.0–0.2)

## 2021-10-13 LAB — BASIC METABOLIC PANEL
Anion gap: 7 (ref 5–15)
BUN: 6 mg/dL — ABNORMAL LOW (ref 8–23)
CO2: 22 mmol/L (ref 22–32)
Calcium: 7.8 mg/dL — ABNORMAL LOW (ref 8.9–10.3)
Chloride: 111 mmol/L (ref 98–111)
Creatinine, Ser: 0.57 mg/dL (ref 0.44–1.00)
GFR, Estimated: >60 mL/min (ref >60)
Glucose, Bld: 113 mg/dL — ABNORMAL HIGH (ref 70–99)
Potassium: 4.1 mmol/L (ref 3.5–5.1)
Sodium: 140 mmol/L (ref 135–145)

## 2021-10-13 LAB — RESP PANEL BY RT-PCR (FLU A&B, COVID) ARPGX2
Influenza A by PCR: NEGATIVE
Influenza B by PCR: NEGATIVE
SARS Coronavirus 2 by RT PCR: NEGATIVE

## 2021-10-13 LAB — GLUCOSE, CAPILLARY
Glucose-Capillary: 107 mg/dL — ABNORMAL HIGH (ref 70–99)
Glucose-Capillary: 168 mg/dL — ABNORMAL HIGH (ref 70–99)
Glucose-Capillary: 147 mg/dL — ABNORMAL HIGH (ref 70–99)
Glucose-Capillary: 164 mg/dL — ABNORMAL HIGH (ref 70–99)

## 2021-10-13 MED ORDER — LIP MEDEX EX OINT
TOPICAL_OINTMENT | CUTANEOUS | Status: DC | PRN
  Filled 2021-10-13: qty 7

## 2021-10-13 MED ORDER — CEPHALEXIN 500 MG PO CAPS
500.0000 mg | ORAL_CAPSULE | Freq: Two times a day (BID) | ORAL | Status: DC
  Administered 2021-10-13 – 2021-10-16 (×7): 500 mg via ORAL
  Filled 2021-10-13 (×7): qty 1

## 2021-10-13 MED ORDER — LOSARTAN POTASSIUM 50 MG PO TABS
50.0000 mg | ORAL_TABLET | Freq: Every day | ORAL | Status: DC
  Administered 2021-10-13 – 2021-10-14 (×2): 50 mg via ORAL
  Filled 2021-10-13 (×2): qty 1

## 2021-10-13 NOTE — [place] FL2 (Addendum)
?[place] MEDICAID FL2 LEVEL OF CARE SCREENING TOOL  ?  ? ?IDENTIFICATION  ?Patient Name: ?Marissa Ryan Birthdate: May 24, 1925 Sex: female Admission Date (Current Location): ?10/10/2021  ?[place] and [place] Number: ? [place] ?  Facility and Address:  ?[facility],  [address] [place], [place] ?     Provider Number: ?[id number]  ?Attending Physician Name and Address:  ?Georgette Shell, MD ? Relative Name and Phone Number:  ?Brunetta Genera Cy Blamer [phone] ?   ?Current Level of Care: ?Hospital Recommended Level of Care: ?[place] Prior Approval Number: ?  ? ?Date Approved/Denied: ?  PASRR Number: ?[id number] A ? ?Discharge Plan: ?SNF ?  ? ?Current Diagnoses: ?Patient Active Problem List  ? Diagnosis Date Noted  ? Malnutrition of moderate degree 10/13/2021  ? Rhabdomyolysis 10/10/2021  ? Fracture of multiple pubic rami, right, closed, initial encounter ([place]) 06/20/2020  ? Pressure injury of skin 10/15/2017  ? Abdominal pain 10/14/2017  ? IBS (irritable bowel syndrome) 10/14/2017  ? Anxiety 10/14/2017  ? Anemia 10/14/2017  ? 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 OA (osteoarthritis) of hip 02/20/2014  ? Essential hypertension 12/03/2013  ? Hyperlipidemia 12/03/2013  ? Diabetes ([place]) 12/03/2013  ? ? ?Orientation RESPIRATION BLADDER Height & Weight   ?  ?Self ? Normal Continent, External catheter (currently with purewick) Weight: 140 lb (63.5 kg) ?Height:  '5\' 2"'$  (157.5 cm)  ?BEHAVIORAL SYMPTOMS/MOOD NEUROLOGICAL BOWEL NUTRITION STATUS  ?    Incontinent    ?AMBULATORY STATUS COMMUNICATION OF NEEDS Skin   ?Extensive Assist Verbally Other (Comment) (skin tear arm) ?  ?  ?  ?    ?     ?     ? ? ?Personal Care Assistance Level of Assistance  ?Bathing, Dressing Bathing Assistance: Limited assistance ?  ?Dressing Assistance: Limited assistance ?   ? ?Functional Limitations Info  ?    ?  ?   ? ? ?SPECIAL CARE FACTORS FREQUENCY  ?PT (By licensed PT), OT  (By licensed OT)   ?  ?PT Frequency: 5x/wk ?OT Frequency: 5x/wk ?  ?  ?  ?   ? ? ?Contractures Contractures Info: Not present  ? ? ?Additional Factors Info  ?Code Status, Insulin Sliding Scale, Allergies, Psychotropic Code Status Info: DNR ?Allergies Info: : Robaxin (Methocarbamol), Ancef (Cefazolin), Dilaudid (Hydromorphone), Fosamax (Alendronate), Hyoscyamine, Morphine And Related, Nsaids, Parathyroid Hormone (Recomb), Penicillins, Tape, Flurbiprofen ?Psychotropic Info: see MAR ?Insulin Sliding Scale Info: see MAR ?  ?   ? ?Current Medications (10/13/2021):  This is the current hospital active medication list ?Current Facility-Administered Medications  ?Medication Dose Route Frequency Provider Last Rate Last Admin  ? 0.9 % NaCl with KCl 40 mEq / L  infusion   Intravenous Continuous Georgette Shell, MD   Stopped at 10/13/21 1000  ? acetaminophen (TYLENOL) tablet 500 mg  500 mg Oral TID Georgette Shell, MD   500 mg at 10/13/21 3016  ? ALPRAZolam Duanne Moron) tablet 0.25 mg  0.25 mg Oral QHS PRN Georgette Shell, MD   0.25 mg at 10/13/21 0012  ? cephALEXin (KEFLEX) capsule 500 mg  500 mg Oral Q12H Georgette Shell, MD      ? enoxaparin (LOVENOX) injection 40 mg  40 mg Subcutaneous Q24H Georgette Shell, MD   40 mg at 10/12/21 1758  ? feeding supplement (ENSURE ENLIVE / ENSURE PLUS) liquid 237 mL  237 mL Oral BID BM Georgette Shell, MD   237 mL at 10/13/21 0109  ?  insulin aspart (novoLOG) injection 0-6 Units  0-6 Units Subcutaneous TID WC Georgette Shell, MD   1 Units at 10/13/21 1223  ? lip balm (CARMEX) ointment   Topical PRN Georgette Shell, MD   Given at 10/13/21 1337  ? losartan (COZAAR) tablet 50 mg  50 mg Oral QHS Georgette Shell, MD      ? multivitamin with minerals tablet 1 tablet  1 tablet Oral Daily Georgette Shell, MD   1 tablet at 10/13/21 0211  ? senna-docusate (Senokot-S) tablet 1 tablet  1 tablet Oral BID Georgette Shell, MD   1 tablet at 10/12/21 2011  ?  sertraline (ZOLOFT) tablet 50 mg  50 mg Oral QHS Georgette Shell, MD   50 mg at 10/12/21 2010  ? ? ? ?Discharge Medications: ?Please see discharge summary for a list of discharge medications. ? ?Relevant Imaging Results: ? ?Relevant Lab Results: ? ? ?Additional Information ?SSN [id number] ? ?Laila Myhre, LCSW ? ? ? ? ?

## 2021-10-13 NOTE — TOC Progression Note (Signed)
Transition of Care (TOC) - Progression Note  ? ? ?Patient Details  ?Name: Marissa Ryan ?MRN: [id number] ?Date of Birth: 1925/04/10 ? ?Transition of Care (TOC) CM/SW Contact  ?Srija Southard, LCSW ?Phone Number: ?10/13/2021, 1:08 PM ? ?Clinical Narrative:    ?Have spoken with pt's family, Cy Blamer, and with Ludwig Clarks with [facility].  Family aware that therapies have recommended SNF for rehab and they are in agreement.  Able to confirm today that [facility] can offer a semi private room in their Rehab level tomorrow with plan (hopefully) to transition pt to ALF when appropriate.  Family has accepted this bed.  MD aware and have requested COVID test. ? ? ?Expected Discharge Plan: [place] (vs. ALF) ?Barriers to Discharge: Continued Medical Work up ? ?Expected Discharge Plan and Services ?Expected Discharge Plan: [place] (vs. ALF) ?In-house Referral: Clinical Social Work ?  ?  ?Living arrangements for the past 2 months: [place] ?                ?  ?  ?  ?  ?  ?  ?  ?  ?  ?  ? ? ?Social Determinants of Health (SDOH) Interventions ?  ? ?Readmission Risk Interventions ?   ? View : No data to display.  ?  ?  ?  ? ? ?

## 2021-10-13 NOTE — Progress Notes (Addendum)
?PROGRESS NOTE ? ? ? ?Marissa Ryan  IRS:[id number] DOB: [birth year] DOA: 10/10/2021 ?PCP: Crist Infante, MD  ? ? ?Brief Narrative: Marissa Ryan is a 88 y.o. female with medical history significant of severe dementia lives alone found on the floor by family members.  Usually family checks on her frequently but family members had medical emergencies and they were not checking her as usual.  She was found on the floor with no pants on with urine odour noted.  She has chronic atrial fibrillation. ?Family was in the process of placing her in [facility]'s home assisted living. ?ED Course: She was found to have a CPK of 1234, white count 14.5, CT head and CT C-spine with no acute findings, UA consistent with UTI and dehydration, subacute pelvic fracture and T11 fracture.  She was given IV fluids and Rocephin. ?Chest x-ray shows no focal consolidation or fluid overload ?X-ray of the knee shows tricompartmental arthritis ?X-ray of the lumbar spine chronic L1 compression fracture T11 compression fracture fracture of the right superior pubic ramus likely chronic ?Fracture of the right superior inferior pubic rami new since 2015 but appear chronic.  No convincing acute fracture noted. ?In the ER blood pressure was 159/127 with a pulse of 126 and respiration of 25 99% on room air ? ?Discussed with grandson [name] who will be the point of contact.  His number is [phone].  They were planning to place her in a friend's home assisted living.  They would like her to go to skilled nursing facility at friend's home  For long-term care. ? ?Assessment & Plan: ?  ?Principal Problem: ?  Rhabdomyolysis ?Active Problems: ?  Malnutrition of moderate degree ? ? ?#1 uti UA consistent with UTI  ?Urine culture ordered but not done on admit ?Prior urine culture with pansensitive E. coli.  Was on IV Rocephin.   ?Keflex started 3/28 for 5 to 7 days ?Leukocytosis resolved patient is afebrile ?  ?#2 rhabdomyolysis resolved ?patient was on the floor for  unknown amount of time now with mild rhabdomyolysis with a CPK 914 from 1234. ?dc IV fluids once current bag done dw nurse ? ?  ?#3 dehydration UA positive for ketones on IV fluids. ?  ?#4  Multiple subacute to old fractures-T11 fracture subacute pelvic fracture-we will consult PT and OT. ?Pain control with Tylenol. ?  ?#5 leukocytosis resolved  ? ?#6 hypertension soft bp decreased cozaar to 50 mg qd  ?#7 type 2 diabetes on metformin at home we will hold metformin and add sliding scale ?  ?#8 goals of care patient is DNR on admission.  Will discuss with family regarding palliative care  ?TOC consulted for placement to friend's home with long-term care ?TOC informed me that bed available for 10/14/2021 for [facility] home. ?COVID for SNF placement ordered 10/13/2021 ?  ?#9 moderate malnutrition as evidenced by muscle wasting fat depletion due to chronic illness and dementia ?Appreciate  dietary ? ?#10 hypokalemia repleted and resolved ? ?#11 stage I pressure injury to coccyx present on admission ? ?Pressure Injury 10/14/17 Stage I -  Intact skin with non-blanchable redness of a localized area usually over a bony prominence. (Active)  ?10/14/17 1700  ?Location: Coccyx  ?Location Orientation: Posterior;Medial  ?Staging: Stage I -  Intact skin with non-blanchable redness of a localized area usually over a bony prominence.  ?Wound Description (Comments):   ?Present on Admission: Yes  ? ? ?Estimated body mass index is 25.61 kg/m? as calculated from the following: ?  Height as of this encounter: '5\' 2"'$  (1.575 m). ?  Weight as of this encounter: 63.5 kg. ? ?DVT prophylaxis: Lovenox ?Code Status: Full code ?Family Communication: Discussed with grandson ?Disposition Plan:  Status is: Inpatient await placement ?Remains inpatient appropriate because: uti dehydration on IV antibiotics UTI ?  ?Consultants:  ?None ? ?Procedures: None ?Antimicrobials: Rocephin ? ?Subjective: ? ?Patient resting in bed ?Asked me for drink of boost which  she started drinking while I was in the room ?Very pleasant now that she has hearing aid in place ?Pleasant objective: ?Vitals:  ? 10/12/21 0604 10/12/21 1342 10/12/21 2036 10/13/21 0537  ?BP: (!) 157/71 (!) 167/66 (!) 153/65 113/80  ?Pulse: 65 63 79 65  ?Resp: 17 (!) '21 18 18  '$ ?Temp: 97.9 ?F (36.6 ?C) 98 ?F (36.7 ?C) (!) 97.3 ?F (36.3 ?C) 98.7 ?F (37.1 ?C)  ?TempSrc:  Oral Oral Oral  ?SpO2: 99% 99% 99% 97%  ?Weight:      ?Height:      ? ? ?Intake/Output Summary (Last 24 hours) at 10/13/2021 1303 ?Last data filed at 10/13/2021 0600 ?Gross per 24 hour  ?Intake 2143.25 ml  ?Output --  ?Net 2143.25 ml  ? ? ?Filed Weights  ? 10/10/21 1424  ?Weight: 63.5 kg  ? ? ?Examination: ? ?General exam: Appears pleasant  ?respiratory system: Clear to auscultation. Respiratory effort normal. ?Cardiovascular system: S1 & S2 heard, RRR. No JVD, murmurs, rubs, gallops or clicks. No pedal edema. ?Gastrointestinal system: Abdomen is nondistended, soft and nontender. No organomegaly or masses felt. Normal bowel sounds heard. ?Central nervous system: Pleasantly confused. ?Extremities: Symmetric 5 x 5 power. ?Skin: No rashes, lesions or ulcers ?Psychiatry: Unable to assess ? ? ? ?Data Reviewed: I have personally reviewed following labs and imaging studies ? ?CBC: ?Recent Labs  ?Lab 10/10/21 ?1434 10/12/21 ?0322 10/13/21 ?0704  ?WBC 14.5* 9.3 9.4  ?NEUTROABS 12.4*  --   --   ?HGB 14.9 10.8* 11.6*  ?HCT 44.4 33.5* 35.5*  ?MCV 94.3 98.0 96.7  ?PLT 341 259 282  ? ? ?Basic Metabolic Panel: ?Recent Labs  ?Lab 10/10/21 ?1434 10/11/21 ?0325 10/12/21 ?0322 10/13/21 ?3567  ?NA 136 139 136 140  ?K 3.8 3.0* 2.9* 4.1  ?CL 100 108 109 111  ?CO2 23 22 20* 22  ?GLUCOSE 188* 153* 106* 113*  ?BUN '19 19 15 '$ 6*  ?CREATININE 0.74 0.77 0.55 0.57  ?CALCIUM 9.3 8.1* 7.3* 7.8*  ? ? ?GFR: ?Estimated Creatinine Clearance: 37.7 mL/min (by C-G formula based on SCr of 0.57 mg/dL). ?Liver Function Tests: ?Recent Labs  ?Lab 10/10/21 ?1434 10/11/21 ?0325 10/12/21 ?0322  ?AST  59* 49* 39  ?ALT '28 25 24  '$ ?ALKPHOS 50 41 34*  ?BILITOT 0.8 0.6 0.4  ?PROT 7.5 5.8* 5.1*  ?ALBUMIN 3.8 3.1* 2.7*  ? ? ?No results for input(s): LIPASE, AMYLASE in the last 168 hours. ?No results for input(s): AMMONIA in the last 168 hours. ?Coagulation Profile: ?No results for input(s): INR, PROTIME in the last 168 hours. ?Cardiac Enzymes: ?Recent Labs  ?Lab 10/10/21 ?1434 10/11/21 ?0325  ?CKTOTAL 1,234* 914*  ? ? ?BNP (last 3 results) ?No results for input(s): PROBNP in the last 8760 hours. ?HbA1C: ?Recent Labs  ?  10/11/21 ?0325  ?HGBA1C 6.8*  ? ? ?CBG: ?Recent Labs  ?Lab 10/12/21 ?1153 10/12/21 ?1555 10/12/21 ?2129 10/13/21 ?0739 10/13/21 ?1132  ?GLUCAP 138* 138* 158* 107* 168*  ? ? ?Lipid Profile: ?No results for input(s): CHOL, HDL, LDLCALC, TRIG, CHOLHDL, LDLDIRECT in the last 72  hours. ?Thyroid Function Tests: ?No results for input(s): TSH, T4TOTAL, FREET4, T3FREE, THYROIDAB in the last 72 hours. ?Anemia Panel: ?No results for input(s): VITAMINB12, FOLATE, FERRITIN, TIBC, IRON, RETICCTPCT in the last 72 hours. ?Sepsis Labs: ?No results for input(s): PROCALCITON, LATICACIDVEN in the last 168 hours. ? ?No results found for this or any previous visit (from the past 240 hour(s)).  ? ? ? ? ? ?Radiology Studies: ?No results found. ? ? ? ? ? ?Scheduled Meds: ? acetaminophen  500 mg Oral TID  ? enoxaparin (LOVENOX) injection  40 mg Subcutaneous Q24H  ? feeding supplement  237 mL Oral BID BM  ? insulin aspart  0-6 Units Subcutaneous TID WC  ? losartan  100 mg Oral QHS  ? multivitamin with minerals  1 tablet Oral Daily  ? senna-docusate  1 tablet Oral BID  ? sertraline  50 mg Oral QHS  ? ?Continuous Infusions: ? 0.9 % NaCl with KCl 40 mEq / L Stopped (10/13/21 1000)  ? cefTRIAXone (ROCEPHIN)  IV 1 g (10/12/21 1753)  ? ? ? LOS: 2 days  ? ? ?Time spent: 37 MIN ? ?Georgette Shell, MD ?10/13/2021, 1:03 PM  ? ?

## 2021-10-13 NOTE — Plan of Care (Signed)
°  Problem: Clinical Measurements: °Goal: Ability to maintain clinical measurements within normal limits will improve °Outcome: Progressing °  °Problem: Activity: °Goal: Risk for activity intolerance will decrease °Outcome: Progressing °  °Problem: Nutrition: °Goal: Adequate nutrition will be maintained °Outcome: Progressing °  °

## 2021-10-14 DIAGNOSIS — M6282 Rhabdomyolysis: Secondary | ICD-10-CM | POA: Diagnosis not present

## 2021-10-14 LAB — GLUCOSE, CAPILLARY
Glucose-Capillary: 102 mg/dL — ABNORMAL HIGH (ref 70–99)
Glucose-Capillary: 133 mg/dL — ABNORMAL HIGH (ref 70–99)
Glucose-Capillary: 121 mg/dL — ABNORMAL HIGH (ref 70–99)
Glucose-Capillary: 161 mg/dL — ABNORMAL HIGH (ref 70–99)

## 2021-10-14 MED ORDER — LORAZEPAM 0.5 MG PO TABS
0.2500 mg | ORAL_TABLET | Freq: Once | ORAL | Status: AC
  Administered 2021-10-14: 0.25 mg via ORAL
  Filled 2021-10-14: qty 1

## 2021-10-14 MED ORDER — LORAZEPAM 0.5 MG PO TABS
0.5000 mg | ORAL_TABLET | Freq: Once | ORAL | Status: AC
  Administered 2021-10-14: 0.5 mg via ORAL
  Filled 2021-10-14: qty 1

## 2021-10-14 MED ORDER — AMLODIPINE BESYLATE 5 MG PO TABS
5.0000 mg | ORAL_TABLET | Freq: Every day | ORAL | Status: DC
  Administered 2021-10-14: 5 mg via ORAL
  Filled 2021-10-14 (×2): qty 1

## 2021-10-14 NOTE — Progress Notes (Signed)
?PROGRESS NOTE ? ? ? ?Marissa Ryan  PPI:[id number] DOB: 1925/03/16 DOA: 10/10/2021 ? ?PCP: Crist Infante, MD  ? ? ?Brief Narrative:  ?This 88 yrs old female with PMH significant for severe dementia and lives alone, She was found on the floor by her family members.  Usually family checks on her frequently but family members had medical emergency and they were not checking on her as usual.  She was found on the floor with no pants on with urine odour noted.  She has chronic atrial fibrillation.  Family is in the process of placing her in [facility] home assisted living.  She was found to have elevated CPK 1234, CT head CT C-spine with no acute findings.  UA consistent with UTI and dehydration.  Subacute pelvic fractures and T12 fracture.  She was started on Rocephin and admitted for IV hydration.  X-ray of knees shows tricompartmental arthritis.  X-rays of lumbar spine shows chronic L1 compression fracture, T12 compression fracture and fracture of right superior pubic ramus likely chronic.  Patient is doing comparatively better.  Does have intermittent agitation. ? ?Assessment & Plan: ?  ?Principal Problem: ?  Rhabdomyolysis ?Active Problems: ?  Malnutrition of moderate degree ? ?Urinary tract infection: ?Urine culture was not done on admission. ?Prior urine culture pansensitive E. coli. ?She was started on IV Rocephin,  changed to Keflex. ?Leukocytosis resolved,  patient remains afebrile. ? ?Rhabdomyolysis> Improved. ?Patient was found on the floor for unknown amount of time. ?Rhabdomyolysis has improved with IV hydration.  CPK 1234> 914 ? ?Dehydration: Resolved ?IV fluids discontinued. ? ?Multiple subacute /old fractures: ?X-ray shows T11 compression fracture, subacute pelvic fracture ?Adequate pain control with Tylenol. ?PT and OT consultation recommended SNF. ? ?Leukocytosis: Resolved. ? ?Hypertension:  ?Cozaar reduced to 50 mg daily due to low blood pressure. ? ?Type 2 diabetes: ?Hold metformin.  Continue sliding  scale. ? ?Hypokalemia: ?Replaced and resolved. ? ?Nutrition Status: ?Nutrition Problem: Moderate Malnutrition ?Etiology: chronic illness (advanced dementia) ?Signs/Symptoms: moderate fat depletion, moderate muscle depletion, severe muscle depletion ?Interventions: Liberalize Diet, Ensure Enlive (each supplement provides 350kcal and 20 grams of protein), Magic cup, MVI ?  ? ?  ?Pressure Injury 10/14/17 Stage I -  Intact skin with non-blanchable redness of a localized area usually over a bony prominence. (Active)  ?10/14/17 1700  ?Location: Coccyx  ?Location Orientation: Posterior;Medial  ?Staging: Stage I -  Intact skin with non-blanchable redness of a localized area usually over a bony prominence.  ?Wound Description (Comments):   ?Present on Admission: Yes  ?  ? ?DVT prophylaxis: Lovenox ?Code Status: DNR ?Family Communication: No family at bedside ?Disposition Plan:  ?Status is: Inpatient ?Remains inpatient appropriate because: Admitted for rhabdomyolysis which is improved getting antibiotics for UTI.  Anticipated discharge to SNF tomorrow. ?  ? ?Consultants:  ?None ? ?Procedures: None ? ?Antimicrobials:  ?Anti-infectives (From admission, onward)  ? ? Start     Dose/Rate Route Frequency Ordered Stop  ? 10/13/21 1415  cephALEXin (KEFLEX) capsule 500 mg       ? 500 mg Oral Every 12 hours 10/13/21 1322 10/17/21 0959  ? 10/11/21 1600  cefTRIAXone (ROCEPHIN) 1 g in sodium chloride 0.9 % 100 mL IVPB  Status:  Discontinued       ? 1 g ?200 mL/hr over 30 Minutes Intravenous Every 24 hours 10/10/21 1751 10/13/21 1311  ? 10/10/21 1545  cefTRIAXone (ROCEPHIN) 1 g in sodium chloride 0.9 % 100 mL IVPB       ? 1 g ?  200 mL/hr over 30 Minutes Intravenous  Once 10/10/21 1541 10/10/21 1649  ? ?  ?  ? ?Subjective: ?Patient was seen and examined at bedside.  Overnight events noted.   ?Patient was agitated in the morning and  was given Ativan.  She remains very sleepy,arousable. ? ?Objective: ?Vitals:  ? 10/13/21 0537 10/13/21 1408  10/13/21 2139 10/14/21 0451  ?BP: 113/80 (!) 171/61 (!) 177/80 (!) 158/79  ?Pulse: 65 75 72 (!) 51  ?Resp: '18 16 16 16  '$ ?Temp: 98.7 ?F (37.1 ?C) 98.3 ?F (36.8 ?C) 98.2 ?F (36.8 ?C) 97.8 ?F (36.6 ?C)  ?TempSrc: Oral Oral Oral Oral  ?SpO2: 97% 99% 97% 96%  ?Weight:      ?Height:      ? ? ?Intake/Output Summary (Last 24 hours) at 10/14/2021 1221 ?Last data filed at 10/14/2021 0900 ?Gross per 24 hour  ?Intake 120 ml  ?Output 800 ml  ?Net -680 ml  ? ?Filed Weights  ? 10/10/21 1424  ?Weight: 63.5 kg  ? ? ?Examination: ? ?General exam: Appears comfortable, not in any acute distress.  Deconditioned ?Respiratory system: CTA bilaterally, normal respiratory effort.  No accessory muscle use. ?Cardiovascular system: S1 & S2 heard, regular rate and rhythm, no murmur. ?Gastrointestinal system: Abdomen soft, non tender, non distended, BS+ ?Central nervous system: Alert and oriented x 1. No focal neurological deficits. ?Extremities: No edema, no cyanosis, no clubbing. ?Skin: No rashes, lesions or ulcers ?Psychiatry: Not assessed since patient is very sleepy. ? ? ? ?Data Reviewed: I have personally reviewed following labs and imaging studies ? ?CBC: ?Recent Labs  ?Lab 10/10/21 ?1434 10/12/21 ?0322 10/13/21 ?0704  ?WBC 14.5* 9.3 9.4  ?NEUTROABS 12.4*  --   --   ?HGB 14.9 10.8* 11.6*  ?HCT 44.4 33.5* 35.5*  ?MCV 94.3 98.0 96.7  ?PLT 341 259 282  ? ?Basic Metabolic Panel: ?Recent Labs  ?Lab 10/10/21 ?1434 10/11/21 ?0325 10/12/21 ?0322 10/13/21 ?2725  ?NA 136 139 136 140  ?K 3.8 3.0* 2.9* 4.1  ?CL 100 108 109 111  ?CO2 23 22 20* 22  ?GLUCOSE 188* 153* 106* 113*  ?BUN '19 19 15 '$ 6*  ?CREATININE 0.74 0.77 0.55 0.57  ?CALCIUM 9.3 8.1* 7.3* 7.8*  ? ?GFR: ?Estimated Creatinine Clearance: 37.7 mL/min (by C-G formula based on SCr of 0.57 mg/dL). ?Liver Function Tests: ?Recent Labs  ?Lab 10/10/21 ?1434 10/11/21 ?0325 10/12/21 ?0322  ?AST 59* 49* 39  ?ALT '28 25 24  '$ ?ALKPHOS 50 41 34*  ?BILITOT 0.8 0.6 0.4  ?PROT 7.5 5.8* 5.1*  ?ALBUMIN 3.8 3.1* 2.7*   ? ?No results for input(s): LIPASE, AMYLASE in the last 168 hours. ?No results for input(s): AMMONIA in the last 168 hours. ?Coagulation Profile: ?No results for input(s): INR, PROTIME in the last 168 hours. ?Cardiac Enzymes: ?Recent Labs  ?Lab 10/10/21 ?1434 10/11/21 ?0325  ?CKTOTAL 1,234* 914*  ? ?BNP (last 3 results) ?No results for input(s): PROBNP in the last 8760 hours. ?HbA1C: ?No results for input(s): HGBA1C in the last 72 hours. ?CBG: ?Recent Labs  ?Lab 10/13/21 ?1132 10/13/21 ?1705 10/13/21 ?2151 10/14/21 ?3664 10/14/21 ?1216  ?GLUCAP 168* 147* 164* 102* 133*  ? ?Lipid Profile: ?No results for input(s): CHOL, HDL, LDLCALC, TRIG, CHOLHDL, LDLDIRECT in the last 72 hours. ?Thyroid Function Tests: ?No results for input(s): TSH, T4TOTAL, FREET4, T3FREE, THYROIDAB in the last 72 hours. ?Anemia Panel: ?No results for input(s): VITAMINB12, FOLATE, FERRITIN, TIBC, IRON, RETICCTPCT in the last 72 hours. ?Sepsis Labs: ?No results for input(s): PROCALCITON, LATICACIDVEN  in the last 168 hours. ? ?Recent Results (from the past 240 hour(s))  ?Resp Panel by RT-PCR (Flu A&B, Covid) Nasopharyngeal Swab     Status: None  ? Collection Time: 10/13/21  1:19 PM  ? Specimen: Nasopharyngeal Swab; Nasopharyngeal(NP) swabs in vial transport medium  ?Result Value Ref Range Status  ? SARS Coronavirus 2 by RT PCR NEGATIVE NEGATIVE Final  ?  Comment: (NOTE) ?SARS-CoV-2 target nucleic acids are NOT DETECTED. ? ?The SARS-CoV-2 RNA is generally detectable in upper respiratory ?specimens during the acute phase of infection. The lowest ?concentration of SARS-CoV-2 viral copies this assay can detect is ?138 copies/mL. A negative result does not preclude SARS-Cov-2 ?infection and should not be used as the sole basis for treatment or ?other patient management decisions. A negative result may occur with  ?improper specimen collection/handling, submission of specimen other ?than nasopharyngeal swab, presence of viral mutation(s) within  the ?areas targeted by this assay, and inadequate number of viral ?copies(<138 copies/mL). A negative result must be combined with ?clinical observations, patient history, and epidemiological ?information. The expe

## 2021-10-14 NOTE — Plan of Care (Signed)
  Problem: Health Behavior/Discharge Planning: Goal: Ability to manage health-related needs will improve Outcome: Progressing   Problem: Safety: Goal: Ability to remain free from injury will improve Outcome: Progressing   

## 2021-10-14 NOTE — TOC Progression Note (Addendum)
Transition of Care (TOC) - Progression Note  ? ? ?Patient Details  ?Name: Marissa Ryan ?MRN: [id number] ?Date of Birth: [birth year] ? ?Transition of Care (TOC) CM/SW Contact  ?Tawanna Cooler, RN ?Phone Number: ?10/14/2021, 11:57 AM ? ?Clinical Narrative:    ? ?Patient had a covid test yesterday and it was negative.  Called [name] at [facility] to see if still good with the plan to dc today.  Left message with CM contact info.   ?Spoke with patient's grandson, [name], to let him know that CM waiting for confirmation from [place] that today is still okay, and still pending a d/c order, but will call him once things are in place.  Dean verbalized understanding.     ? ?Addendum: Received message from attending that patient received ativan earlier, very sleepy, will plan for discharge tomorrow.  Spoke with [name] at [facility] and let her know.  Also updated grandson, Scientist, physiological.   ? ? ?Expected Discharge Plan: [place] (vs. ALF) ?Barriers to Discharge: Continued Medical Work up ? ?Expected Discharge Plan and Services ?Expected Discharge Plan: [place] (vs. ALF) ?In-house Referral: Clinical Social Work ?  ?  ?Living arrangements for the past 2 months: [place] ?                ?  ?  ?  ?  ?  ?  ?  ?  ?  ?  ? ? ?Social Determinants of Health (SDOH) Interventions ?  ? ?Readmission Risk Interventions ?   ? View : No data to display.  ?  ?  ?  ? ? ?

## 2021-10-15 DIAGNOSIS — M6282 Rhabdomyolysis: Secondary | ICD-10-CM | POA: Diagnosis not present

## 2021-10-15 LAB — GLUCOSE, CAPILLARY
Glucose-Capillary: 133 mg/dL — ABNORMAL HIGH (ref 70–99)
Glucose-Capillary: 150 mg/dL — ABNORMAL HIGH (ref 70–99)
Glucose-Capillary: 116 mg/dL — ABNORMAL HIGH (ref 70–99)
Glucose-Capillary: 144 mg/dL — ABNORMAL HIGH (ref 70–99)

## 2021-10-15 LAB — BASIC METABOLIC PANEL
Anion gap: 9 (ref 5–15)
BUN: 11 mg/dL (ref 8–23)
CO2: 23 mmol/L (ref 22–32)
Calcium: 8.7 mg/dL — ABNORMAL LOW (ref 8.9–10.3)
Chloride: 101 mmol/L (ref 98–111)
Creatinine, Ser: 0.6 mg/dL (ref 0.44–1.00)
GFR, Estimated: >60 mL/min (ref >60)
Glucose, Bld: 118 mg/dL — ABNORMAL HIGH (ref 70–99)
Potassium: 3.1 mmol/L — ABNORMAL LOW (ref 3.5–5.1)
Sodium: 133 mmol/L — ABNORMAL LOW (ref 135–145)

## 2021-10-15 LAB — PHOSPHORUS: Phosphorus: 2.7 mg/dL (ref 2.5–4.6)

## 2021-10-15 LAB — MAGNESIUM: Magnesium: 1.7 mg/dL (ref 1.7–2.4)

## 2021-10-15 LAB — CK: Total CK: 168 U/L (ref 38–234)

## 2021-10-15 MED ORDER — SODIUM CHLORIDE 0.9 % IV BOLUS
500.0000 mL | Freq: Once | INTRAVENOUS | Status: AC
  Administered 2021-10-15: 500 mL via INTRAVENOUS

## 2021-10-15 MED ORDER — POTASSIUM CHLORIDE 20 MEQ PO PACK
40.0000 meq | PACK | Freq: Once | ORAL | Status: AC
  Administered 2021-10-15: 40 meq via ORAL
  Filled 2021-10-15: qty 2

## 2021-10-15 NOTE — Progress Notes (Signed)
?PROGRESS NOTE ? ? ? ?Marissa Ryan  BZJ:[id number] DOB: 1925-07-05 DOA: 10/10/2021 ? ?PCP: Crist Infante, MD  ? ? ?Brief Narrative:  ?This 88 yrs old female with PMH significant for severe dementia and lives alone, She was found on the floor by her family members.  Usually family checks on her frequently but family members had medical emergency and they were not checking on her as usual.  She was found on the floor with no pants on with urine odour noted.  She has chronic atrial fibrillation.  Family is in the process [facility].  She was found to have elevated CPK 1234, CT head CT C-spine with no acute findings.  UA consistent with UTI and dehydration.  Subacute pelvic fractures and T12 fracture.  She was started on Rocephin and admitted for IV hydration.  X-ray of knees shows tricompartmental arthritis.  X-rays of lumbar spine shows chronic L1 compression fracture, T12 compression fracture and fracture of right superior pubic ramus likely chronic.  Patient is doing comparatively better.  Does have intermittent agitation. ? ?Assessment & Plan: ?  ?Principal Problem: ?  Rhabdomyolysis ?Active Problems: ?  Malnutrition of moderate degree ? ?Urinary tract infection: ?Urine culture was not done on admission. ?Prior urine culture showed pan sensitive E. coli. ?She was started on IV Rocephin, changed to Keflex. ?Leukocytosis resolved,  patient remains afebrile. ? ?Rhabdomyolysis> Improved. ?Patient was found on the floor for unknown amount of time. ?Rhabdomyolysis has improved with IV hydration.  CPK 1234> 914>168 ? ?Dehydration: Improving ?Continue IV fluids, she seems dehydrated  ? ?Multiple subacute /old fractures: ?X-ray shows T11 compression fracture, subacute pelvic fracture ?Adequate pain control with Tylenol. ?PT and OT consultation recommended SNF. ? ?Leukocytosis: Resolved. ? ?Hypertension:  ?Discontinue Cozaar and amlodipine due to low BP. ?Continue to monitor blood  pressure. ? ?Type 2 diabetes: ?Hold metformin.  Continue sliding scale. ? ?Hypokalemia: ?Replaced and resolved. ? ?Nutrition Status: ?Nutrition Problem: Moderate Malnutrition ?Etiology: chronic illness (advanced dementia) ?Signs/Symptoms: moderate fat depletion, moderate muscle depletion, severe muscle depletion ?Interventions: Liberalize Diet, Ensure Enlive (each supplement provides 350kcal and 20 grams of protein), Magic cup, MVI ?  ? ?  ?Pressure Injury 10/14/17 Stage I -  Intact skin with non-blanchable redness of a localized area usually over a bony prominence. (Active)  ?10/14/17 1700  ?Location: Coccyx  ?Location Orientation: Posterior;Medial  ?Staging: Stage I -  Intact skin with non-blanchable redness of a localized area usually over a bony prominence.  ?Wound Description (Comments):   ?Present on Admission: Yes  ?  ? ?DVT prophylaxis: Lovenox ?Code Status: DNR ?Family Communication: No family at bedside ?Disposition Plan:  ?Status is: Inpatient ?Remains inpatient appropriate because: Admitted for rhabdomyolysis which is improved,  getting antibiotics for UTI.  She still appears confused, following commands.  Low blood pressure getting IV hydration. ?  ?Anticipated discharge to SNF tomorrow. ? ?Consultants:  ?None ? ?Procedures: None ? ?Antimicrobials:  ?Anti-infectives (From admission, onward)  ? ? Start     Dose/Rate Route Frequency Ordered Stop  ? 10/13/21 1415  cephALEXin (KEFLEX) capsule 500 mg       ? 500 mg Oral Every 12 hours 10/13/21 1322 10/17/21 0959  ? 10/11/21 1600  cefTRIAXone (ROCEPHIN) 1 g in sodium chloride 0.9 % 100 mL IVPB  Status:  Discontinued       ? 1 g ?200 mL/hr over 30 Minutes Intravenous Every 24 hours 10/10/21 1751 10/13/21 1311  ? 10/10/21 1545  cefTRIAXone (ROCEPHIN) 1 g in sodium chloride 0.9 % 100 mL IVPB       ? 1 g ?200 mL/hr over 30 Minutes Intravenous  Once 10/10/21 1541 10/10/21 1649  ? ?  ?  ? ?Subjective: ?Patient was seen and examined at bedside.  Overnight events  noted.   ?She appears confused, following commands but goes back to sleep again. ?Blood pressure is low, getting IV hydration. ? ?Objective: ?Vitals:  ? 10/14/21 0451 10/14/21 1409 10/14/21 2116 10/15/21 4315  ?BP: (!) 158/79 (!) 179/89 (!) 150/85 93/70  ?Pulse: (!) 51 99 97 70  ?Resp: '16 20 16 16  '$ ?Temp: 97.8 ?F (36.6 ?C) 98.2 ?F (36.8 ?C) 97.8 ?F (36.6 ?C) 98.4 ?F (36.9 ?C)  ?TempSrc: Oral Oral Oral Oral  ?SpO2: 96% 99% 97% 99%  ?Weight:      ?Height:      ? ? ?Intake/Output Summary (Last 24 hours) at 10/15/2021 1138 ?Last data filed at 10/15/2021 0600 ?Gross per 24 hour  ?Intake 639.5 ml  ?Output --  ?Net 639.5 ml  ? ?Filed Weights  ? 10/10/21 1424  ?Weight: 63.5 kg  ? ? ?Examination: ? ?General exam: Deconditioned.  Appears comfortable, not in any acute distress. ?Respiratory system: CTA bilaterally, normal respiratory effort, no accessory muscle use. ?Cardiovascular system: S1 & S2 heard, regular rate and rhythm, no murmur. ?Gastrointestinal system: Abdomen is soft, non tender, non distended, BS+ ?Central nervous system: Alert and oriented x 1. No focal neurological deficits. ?Extremities: No edema, no cyanosis, no clubbing. ?Skin: No rashes, lesions or ulcers ?Psychiatry: Insight and judgment appropriate. ? ? ? ?Data Reviewed: I have personally reviewed following labs and imaging studies ? ?CBC: ?Recent Labs  ?Lab 10/10/21 ?1434 10/12/21 ?0322 10/13/21 ?0704  ?WBC 14.5* 9.3 9.4  ?NEUTROABS 12.4*  --   --   ?HGB 14.9 10.8* 11.6*  ?HCT 44.4 33.5* 35.5*  ?MCV 94.3 98.0 96.7  ?PLT 341 259 282  ? ?Basic Metabolic Panel: ?Recent Labs  ?Lab 10/10/21 ?1434 10/11/21 ?0325 10/12/21 ?4008 10/13/21 ?6761 10/15/21 ?9509  ?NA 136 139 136 140 133*  ?K 3.8 3.0* 2.9* 4.1 3.1*  ?CL 100 108 109 111 101  ?CO2 23 22 20* 22 23  ?GLUCOSE 188* 153* 106* 113* 118*  ?BUN '19 19 15 '$ 6* 11  ?CREATININE 0.74 0.77 0.55 0.57 0.60  ?CALCIUM 9.3 8.1* 7.3* 7.8* 8.7*  ?MG  --   --   --   --  1.7  ?PHOS  --   --   --   --  2.7  ? ?GFR: ?Estimated  Creatinine Clearance: 37.7 mL/min (by C-G formula based on SCr of 0.6 mg/dL). ?Liver Function Tests: ?Recent Labs  ?Lab 10/10/21 ?1434 10/11/21 ?0325 10/12/21 ?0322  ?AST 59* 49* 39  ?ALT '28 25 24  '$ ?ALKPHOS 50 41 34*  ?BILITOT 0.8 0.6 0.4  ?PROT 7.5 5.8* 5.1*  ?ALBUMIN 3.8 3.1* 2.7*  ? ?No results for input(s): LIPASE, AMYLASE in the last 168 hours. ?No results for input(s): AMMONIA in the last 168 hours. ?Coagulation Profile: ?No results for input(s): INR, PROTIME in the last 168 hours. ?Cardiac Enzymes: ?Recent Labs  ?Lab 10/10/21 ?1434 10/11/21 ?0325 10/15/21 ?0640  ?CKTOTAL 1,234* 914* 168  ? ?BNP (last 3 results) ?No results for input(s): PROBNP in the last 8760 hours. ?HbA1C: ?No results for input(s): HGBA1C in the last 72 hours. ?CBG: ?Recent Labs  ?Lab 10/14/21 ?1216 10/14/21 ?1705 10/14/21 ?2112 10/15/21 ?3267 10/15/21 ?1125  ?GLUCAP 133* 121* 161* 133*  150*  ? ?Lipid Profile: ?No results for input(s): CHOL, HDL, LDLCALC, TRIG, CHOLHDL, LDLDIRECT in the last 72 hours. ?Thyroid Function Tests: ?No results for input(s): TSH, T4TOTAL, FREET4, T3FREE, THYROIDAB in the last 72 hours. ?Anemia Panel: ?No results for input(s): VITAMINB12, FOLATE, FERRITIN, TIBC, IRON, RETICCTPCT in the last 72 hours. ?Sepsis Labs: ?No results for input(s): PROCALCITON, LATICACIDVEN in the last 168 hours. ? ?Recent Results (from the past 240 hour(s))  ?Resp Panel by RT-PCR (Flu A&B, Covid) Nasopharyngeal Swab     Status: None  ? Collection Time: 10/13/21  1:19 PM  ? Specimen: Nasopharyngeal Swab; Nasopharyngeal(NP) swabs in vial transport medium  ?Result Value Ref Range Status  ? SARS Coronavirus 2 by RT PCR NEGATIVE NEGATIVE Final  ?  Comment: (NOTE) ?SARS-CoV-2 target nucleic acids are NOT DETECTED. ? ?The SARS-CoV-2 RNA is generally detectable in upper respiratory ?specimens during the acute phase of infection. The lowest ?concentration of SARS-CoV-2 viral copies this assay can detect is ?138 copies/mL. A negative result does not  preclude SARS-Cov-2 ?infection and should not be used as the sole basis for treatment or ?other patient management decisions. A negative result may occur with  ?improper specimen collection/handling, submission of

## 2021-10-15 NOTE — TOC Progression Note (Signed)
Transition of Care (TOC) - Progression Note  ? ? ?Patient Details  ?Name: Marissa Ryan ?MRN: [id number] ?Date of Birth: 08-Sep-1924 ? ?Transition of Care (TOC) CM/SW Contact  ?Kallum Jorgensen, LCSW ?Phone Number: ?10/15/2021, 12:35 PM ? ?Clinical Narrative:    ? ?Per MD, pt may be medically ready for dc to SNF at [facility] by tomorrow.  Have alerted [name] at [facility] as well as grandson and his wife.   ? ?Expected Discharge Plan: [place] (vs. ALF) ?Barriers to Discharge: Continued Medical Work up ? ?Expected Discharge Plan and Services ?Expected Discharge Plan: [place] (vs. ALF) ?In-house Referral: Clinical Social Work ?  ?  ?Living arrangements for the past 2 months: [place] ?                ?  ?  ?  ?  ?  ?  ?  ?  ?  ?  ? ? ?Social Determinants of Health (SDOH) Interventions ?  ? ?Readmission Risk Interventions ?   ? View : No data to display.  ?  ?  ?  ? ? ?

## 2021-10-16 ENCOUNTER — Non-Acute Institutional Stay (SKILLED_NURSING_FACILITY): Payer: Medicare Other | Admitting: Nurse Practitioner

## 2021-10-16 DIAGNOSIS — F0394 Unspecified dementia, unspecified severity, with anxiety: Secondary | ICD-10-CM | POA: Diagnosis not present

## 2021-10-16 DIAGNOSIS — H548 Legal blindness, as defined in USA: Secondary | ICD-10-CM | POA: Diagnosis not present

## 2021-10-16 DIAGNOSIS — I10 Essential (primary) hypertension: Secondary | ICD-10-CM | POA: Diagnosis not present

## 2021-10-16 DIAGNOSIS — R41841 Cognitive communication deficit: Secondary | ICD-10-CM | POA: Diagnosis not present

## 2021-10-16 DIAGNOSIS — R4182 Altered mental status, unspecified: Secondary | ICD-10-CM | POA: Diagnosis not present

## 2021-10-16 DIAGNOSIS — F03C0 Unspecified dementia, severe, without behavioral disturbance, psychotic disturbance, mood disturbance, and anxiety: Secondary | ICD-10-CM | POA: Diagnosis not present

## 2021-10-16 DIAGNOSIS — M4856XD Collapsed vertebra, not elsewhere classified, lumbar region, subsequent encounter for fracture with routine healing: Secondary | ICD-10-CM | POA: Diagnosis not present

## 2021-10-16 DIAGNOSIS — F419 Anxiety disorder, unspecified: Secondary | ICD-10-CM

## 2021-10-16 DIAGNOSIS — R262 Difficulty in walking, not elsewhere classified: Secondary | ICD-10-CM | POA: Diagnosis not present

## 2021-10-16 DIAGNOSIS — M159 Polyosteoarthritis, unspecified: Secondary | ICD-10-CM

## 2021-10-16 DIAGNOSIS — F03C11 Unspecified dementia, severe, with agitation: Secondary | ICD-10-CM | POA: Diagnosis not present

## 2021-10-16 DIAGNOSIS — M810 Age-related osteoporosis without current pathological fracture: Secondary | ICD-10-CM

## 2021-10-16 DIAGNOSIS — I482 Chronic atrial fibrillation, unspecified: Secondary | ICD-10-CM

## 2021-10-16 DIAGNOSIS — M4854XD Collapsed vertebra, not elsewhere classified, thoracic region, subsequent encounter for fracture with routine healing: Secondary | ICD-10-CM | POA: Diagnosis not present

## 2021-10-16 DIAGNOSIS — S32591A Other specified fracture of right pubis, initial encounter for closed fracture: Secondary | ICD-10-CM | POA: Diagnosis not present

## 2021-10-16 DIAGNOSIS — R29898 Other symptoms and signs involving the musculoskeletal system: Secondary | ICD-10-CM | POA: Diagnosis not present

## 2021-10-16 DIAGNOSIS — M6282 Rhabdomyolysis: Secondary | ICD-10-CM | POA: Diagnosis not present

## 2021-10-16 DIAGNOSIS — W19XXXA Unspecified fall, initial encounter: Secondary | ICD-10-CM | POA: Diagnosis not present

## 2021-10-16 DIAGNOSIS — R1310 Dysphagia, unspecified: Secondary | ICD-10-CM

## 2021-10-16 DIAGNOSIS — K5900 Constipation, unspecified: Secondary | ICD-10-CM | POA: Diagnosis not present

## 2021-10-16 DIAGNOSIS — E0800 Diabetes mellitus due to underlying condition with hyperosmolarity without nonketotic hyperglycemic-hyperosmolar coma (NKHHC): Secondary | ICD-10-CM

## 2021-10-16 DIAGNOSIS — Z7401 Bed confinement status: Secondary | ICD-10-CM | POA: Diagnosis not present

## 2021-10-16 DIAGNOSIS — M6281 Muscle weakness (generalized): Secondary | ICD-10-CM | POA: Diagnosis not present

## 2021-10-16 LAB — BASIC METABOLIC PANEL
Anion gap: 5 (ref 5–15)
BUN: 15 mg/dL (ref 8–23)
CO2: 21 mmol/L — ABNORMAL LOW (ref 22–32)
Calcium: 8.4 mg/dL — ABNORMAL LOW (ref 8.9–10.3)
Chloride: 111 mmol/L (ref 98–111)
Creatinine, Ser: 0.61 mg/dL (ref 0.44–1.00)
GFR, Estimated: >60 mL/min (ref >60)
Glucose, Bld: 105 mg/dL — ABNORMAL HIGH (ref 70–99)
Potassium: 4 mmol/L (ref 3.5–5.1)
Sodium: 137 mmol/L (ref 135–145)

## 2021-10-16 LAB — CBC
HCT: 36.7 % (ref 36.0–46.0)
Hemoglobin: 12 g/dL (ref 12.0–15.0)
MCH: 31 pg (ref 26.0–34.0)
MCHC: 32.7 g/dL (ref 30.0–36.0)
MCV: 94.8 fL (ref 80.0–100.0)
Platelets: 365 10*3/uL (ref 150–400)
RBC: 3.87 MIL/uL (ref 3.87–5.11)
RDW: 12.6 % (ref 11.5–15.5)
WBC: 9.2 10*3/uL (ref 4.0–10.5)
nRBC: 0 % (ref 0.0–0.2)

## 2021-10-16 LAB — GLUCOSE, CAPILLARY
Glucose-Capillary: 101 mg/dL — ABNORMAL HIGH (ref 70–99)
Glucose-Capillary: 142 mg/dL — ABNORMAL HIGH (ref 70–99)
Glucose-Capillary: 146 mg/dL — ABNORMAL HIGH (ref 70–99)

## 2021-10-16 MED ORDER — OXYCODONE-ACETAMINOPHEN 10-325 MG PO TABS: 1.0000 | ORAL_TABLET | Freq: Four times a day (QID) | ORAL | 0 refills | Status: DC | PRN

## 2021-10-16 MED ORDER — LOSARTAN POTASSIUM 50 MG PO TABS
50.0000 mg | ORAL_TABLET | Freq: Every day | ORAL | Status: DC
  Administered 2021-10-16: 50 mg via ORAL
  Filled 2021-10-16: qty 1

## 2021-10-16 MED ORDER — LOSARTAN POTASSIUM 50 MG PO TABS: 50.0000 mg | ORAL_TABLET | Freq: Every day | ORAL | 1 refills | Status: DC

## 2021-10-16 MED ORDER — GABAPENTIN 300 MG PO CAPS: 300.0000 mg | ORAL_CAPSULE | Freq: Three times a day (TID) | ORAL | 0 refills | Status: DC

## 2021-10-16 NOTE — Discharge Summary (Addendum)
Physician Discharge Summary  ?Marissa Ryan WEX:[id number] DOB: [birth year] DOA: 10/10/2021 ? ?PCP: Crist Infante, MD ? ?Admit date: 10/10/2021 ? ?Discharge date: 10/16/2021 ? ?Admitted From: Home. ? ?[facility] ? ?Recommendations for Outpatient Follow-up:  ?Follow up with PCP in 1-2 weeks. ?Please obtain BMP/CBC in one week. ?Advised to continue current medications as prescribed. ? ?Home Health: None ?Equipment/Devices: None ? ?Discharge Condition: Stable ?CODE STATUS: DNR ?Diet recommendation: Dysphagia III diet. ? ?Brief Summary / Hospital Course: ?This 88 yrs old female with PMH significant for severe dementia and lives alone, She was found on the floor by her family members.  Usually family checks on her frequently but family members had medical emergency and they were not checking on her as usual.  She was found on the floor with no pants on with urine odour noted. She has chronic atrial fibrillation.  Family is in the process of [facility].  She was found to have elevated CPK 1234, CT head,  CT C-spine with no acute findings.  UA consistent with UTI and dehydration.  Subacute pelvic fractures and T12 fracture were noted on pelvic exam.  She was started on Rocephin and admitted for IV hydration.  X-ray of knees shows tricompartmental arthritis.  X-rays of lumbar spine shows chronic L1 compression fracture, T12 compression fracture and fracture of right superior pubic ramus likely chronic.  Patient has uncomplicated hospital course other than intermittent agitation due to delirium.  She has completed antibiotics for UTI.  CK level normalized.  Rhabdomyolysis improved.  Patient back to her baseline mental status.  Patient is being discharged to skilled nursing facility for rehab. ? ?Discharge Diagnoses:  ?Principal Problem: ?  Rhabdomyolysis ?Active Problems: ?  Malnutrition of moderate degree ? ?Urinary tract infection: ?Urine culture was not done on  admission. ?Prior urine culture showed pan sensitive E. coli. ?She was started on IV Rocephin, changed to Keflex. ?Leukocytosis resolved,  patient remains afebrile. ?She completed course of antibiotics for UTI. ?  ?Rhabdomyolysis > Improved. ?Patient was found on the floor for unknown amount of time. ?Rhabdomyolysis has improved with IV hydration.  CPK 1234> 914>168 ?  ?Dehydration: Improved ?Dehydration resolved. ?  ?Multiple subacute /old fractures: ?X-ray shows T11 compression fracture, subacute pelvic fracture ?Adequate pain control with Tylenol. ?PT and OT consultation recommended SNF. ?  ?Leukocytosis: Resolved. ?  ?Hypertension:  ?Continue Cozaar 50 mg daily. ?  ?Type 2 diabetes: ?Resume home diabetic medications. ?  ?Hypokalemia: ?Replaced and resolved. ?  ?Nutrition Status: ?Nutrition Problem: Moderate Malnutrition ?Etiology: chronic illness (advanced dementia) ?Signs/Symptoms: moderate fat depletion, moderate muscle depletion, severe muscle depletion ?Interventions: Liberalize Diet, Ensure Enlive (each supplement provides 350kcal and 20 grams of protein), Magic cup, MVI ?  ?  ?  ?Pressure Injury 10/14/17 Stage I -  Intact skin with non-blanchable redness of a localized area usually over a bony prominence. (Active)  ?10/14/17 1700  ?Location: Coccyx  ?Location Orientation: Posterior;Medial  ?Staging: Stage I -  Intact skin with non-blanchable redness of a localized area usually over a bony prominence.  ?Wound Description (Comments):   ?Present on Admission: Yes  ?  ? ? ?Discharge Instructions ? ?Discharge Instructions   ? ? Call MD for:  difficulty breathing, headache or visual disturbances   Complete by: As directed ?  ? Call MD for:  persistant dizziness or light-headedness   Complete by: As directed ?  ? Call MD for:  persistant nausea and vomiting  Complete by: As directed ?  ? Diet - low sodium heart healthy   Complete by: As directed ?  ? Diet Carb Modified   Complete by: As directed ?  ? Discharge  instructions   Complete by: As directed ?  ? Advised to follow up PCP in one week. ?Advised to continue current medications as prescribed.  ? Discharge wound care:   Complete by: As directed ?  ? Continue wound care at SNF.  ? Increase activity slowly   Complete by: As directed ?  ? ?  ? ?Allergies as of 10/16/2021   ? ?   Reactions  ? Robaxin [methocarbamol] Rash, Other (See Comments)  ? Red rash on right side of face and forehead cleared after benadryl given, per [name], RN  ? Ancef [cefazolin] Diarrhea  ? Dilaudid [hydromorphone] Other (See Comments)  ? Stomach upset  ? Fosamax [alendronate] Other (See Comments)  ? Upset stomach  ? Hyoscyamine Other (See Comments)  ? "Caused gas"  ? Morphine And Related Itching  ? Nsaids Diarrhea  ? Parathyroid Hormone (recomb) Other (See Comments)  ? was not able to give herself the shot  ? Penicillins Itching  ? Tolerated Unasyn ?Has patient had a PCN reaction causing immediate rash, facial/tongue/throat swelling, SOB or lightheadedness with hypotension: Yes ?Has patient had a PCN reaction causing severe rash involving mucus membranes or skin necrosis: No ?Has patient had a PCN reaction that required hospitalization: Was in hospital ?Has patient had a PCN reaction occurring within the last 10 years: No ?If all of the above answers are "NO", then may proceed with Cephalosporin use.  ? Tape Other (See Comments)  ? Bandages and leads cause bruising and weeping at sites  ? Flurbiprofen Diarrhea  ? ?  ? ?  ?Medication List  ?  ? ?STOP taking these medications   ? ?acetaminophen 500 MG tablet ?Commonly known as: TYLENOL ?  ?ALPRAZolam 0.25 MG tablet ?Commonly known as: Duanne Moron ?  ?senna-docusate 8.6-50 MG tablet ?Commonly known as: Senokot-S ?  ?sertraline 50 MG tablet ?Commonly known as: ZOLOFT ?  ? ?  ? ?TAKE these medications   ? ?aspirin EC 81 MG tablet ?Take 81 mg by mouth daily. ?  ?denosumab 60 MG/ML Sosy injection ?Commonly known as: PROLIA ?Inject 60 mg into the skin every 6  (six) months. Last injection 06/02/21 ?  ?DSS 100 MG Caps ?Take 100 mg by mouth 2 (two) times daily. ?What changed:  ?when to take this ?reasons to take this ?  ?gabapentin 300 MG capsule ?Commonly known as: NEURONTIN ?Take 1 capsule (300 mg total) by mouth 3 (three) times daily. ?What changed:  ?when to take this ?reasons to take this ?  ?losartan 50 MG tablet ?Commonly known as: COZAAR ?Take 1 tablet (50 mg total) by mouth daily. ?Start taking on: October 17, 2021 ?What changed:  ?medication strength ?how much to take ?when to take this ?  ?MELATONIN PO ?Take 1 tablet by mouth at bedtime as needed (sleep). ?  ?metFORMIN 500 MG tablet ?Commonly known as: GLUCOPHAGE ?Take 500 mg by mouth daily. ?  ?mirtazapine 15 MG tablet ?Commonly known as: REMERON ?Take 15 mg by mouth at bedtime. ?  ?oxyCODONE-acetaminophen 10-325 MG tablet ?Commonly known as: PERCOCET ?Take 1 tablet by mouth every 6 (six) hours as needed. ?What changed: how much to take ?  ?PreserVision AREDS 2 Caps ?Take 1 capsule by mouth daily. ?  ? ?  ? ?  ?  ? ? ?  ?  Discharge Care Instructions  ?(From admission, onward)  ?  ? ? ?  ? ?  Start     Ordered  ? 10/16/21 0000  Discharge wound care:       ?Comments: Continue wound care at SNF.  ? 10/16/21 0930  ? ?  ?  ? ?  ? ? Contact information for follow-up providers   ? ? Crist Infante, MD Follow up in 1 week(s).   ?Specialty: Internal Medicine ?Contact information: ?[address] ?[place] [address] ?[phone] ? ? ?  ?  ? ?  ?  ? ? Contact information for after-discharge care   ? ? Destination   ? ? [facility] .   ?Service: Skilled Nursing ?Contact information: ?[address] ?[place] ?[phone] ? ?  ?  ? ?  ?  ? ?  ?  ? ?  ? ?Allergies  ?Allergen Reactions  ? Robaxin [Methocarbamol] Rash and Other (See Comments)  ?  Red rash on right side of face and forehead cleared after benadryl given, per [name], RN  ? Ancef [Cefazolin] Diarrhea  ? Dilaudid  [Hydromorphone] Other (See Comments)  ?  Stomach upset  ? Fosamax [Alendronate] Other (See Comments)  ?  Upset stomach  ? Hyoscyamine Other (See Comments)  ?  "Caused gas"  ? Morphine And Related Itching  ? Nsaids Diarrhea

## 2021-10-16 NOTE — TOC Transition Note (Signed)
Transition of Care (TOC) - CM/SW Discharge Note ? ? ?Patient Details  ?Name: Marissa Ryan ?MRN: [id number] ?Date of Birth: [birth year] ? ?Transition of Care (TOC) CM/SW Contact:  ?Oberon Hehir, LCSW ?Phone Number: ?10/16/2021, 11:55 AM ? ? ?Clinical Narrative:    ?Pt medically cleared for dc to [facility] today.  Pt and family aware and agreeable.  PTAR called at 11:50.  RN to call report to [phone].  No further TOC needs. ? ? ?Final next level of care: [place] ?Barriers to Discharge: Barriers Resolved ? ? ?Patient Goals and CMS Choice ?  ?  ?  ? ?Discharge Placement ?PASRR number recieved: 10/13/21 ?           ?Patient chooses bed at: [facility] ?Patient to be transferred to facility by: PTAR ?Name of family member notified: [name] ?Patient and family notified of of transfer: 10/16/21 ? ?Discharge Plan and Services ?In-house Referral: Clinical Social Work ?  ?           ?DME Arranged: N/A ?DME Agency: NA ?  ?  ?  ?  ?  ?  ?  ?  ? ?Social Determinants of Health (SDOH) Interventions ?  ? ? ?Readmission Risk Interventions ?   ? View : No data to display.  ?  ?  ?  ? ? ? ? ? ?

## 2021-10-16 NOTE — Progress Notes (Signed)
Physical Therapy Treatment ?Patient Details ?Name: Marissa Ryan ?MRN: [id number] ?DOB: [birth year] ?Today's Date: 10/16/2021 ? ? ?History of Present Illness Pt admitted from home after being found on floor.  CT showing L1 and L11 compression fxs and pelvic fxs but all likely chronic.  Pt with hx of DM, osteoporosis, R reverse TSR, L THR, chronic a-fib and dementia ? ?  ?PT Comments  ? ? General Comments: Pt was AxO x 3 aware that she was wet and able to express needs.  "I'm cold" and likes to repeat "I'm 88 years old".  Cute General transfer comment: first assisted to [facility] stand pivot Max Assist.  Pt was able to support self and take a few  General Gait Details: cues for posture, position from RW and safety awareness, pt responed "I'm 88 years old". Positioned in recliner to comfort.   ?Pt plans to D/C to SNF ?  ?Recommendations for follow up therapy are one component of a multi-disciplinary discharge planning process, led by the attending physician.  Recommendations may be updated based on patient status, additional functional criteria and insurance authorization. ? ?Follow Up Recommendations ? Skilled nursing-short term rehab (<3 hours/day) ?  ?  ?Assistance Recommended at Discharge Frequent or constant Supervision/Assistance  ?Patient can return home with the following A little help with walking and/or transfers;A little help with bathing/dressing/bathroom;Assistance with cooking/housework;Direct supervision/assist for medications management;Direct supervision/assist for financial management;Assist for transportation;Help with stairs or ramp for entrance ?  ?Equipment Recommendations ?    ?  ?Recommendations for Other Services   ? ? ?  ?Precautions / Restrictions Precautions ?Precautions: Fall ?Precaution Comments: Hx Dementia ?Restrictions ?Weight Bearing Restrictions: No  ?  ? ?Mobility ? Bed Mobility ?  ?Bed Mobility: Supine to Sit ?  ?  ?Supine to sit: Max assist ?  ?  ?General bed mobility comments: Pt found  incont of loose watery yellowish BM.  Assisted OOB required increased time.  NT also assisted. ?  ? ?Transfers ?Overall transfer level: Needs assistance ?Equipment used: Rolling walker (2 wheels), None ?Transfers: Sit to/from Stand, Bed to chair/wheelchair/BSC ?Sit to Stand: Mod assist, Max assist, +2 physical assistance, +2 safety/equipment ?  ?  ?  ?  ?  ?General transfer comment: first assisted to [facility] stand pivot Max Assist.  Pt was able to support self and take a few ?  ? ?Ambulation/Gait ?Ambulation/Gait assistance: Min assist, Mod assist ?Gait Distance (Feet): 45 Feet ?Assistive device: Rolling walker (2 wheels) ?Gait Pattern/deviations: Step-through pattern, Decreased step length - right, Decreased step length - left, Shuffle, Trunk flexed ?Gait velocity: decreased ?  ?  ?General Gait Details: cues for posture, position from RW and safety awareness, pt responed "I'm 88 years old". ? ? ?Stairs ?  ?  ?  ?  ?  ? ? ?Wheelchair Mobility ?  ? ?Modified Rankin (Stroke Patients Only) ?  ? ? ?  ?Balance   ?  ?  ?  ?  ?  ?  ?  ?  ?  ?  ?  ?  ?  ?  ?  ?  ?  ?  ?  ? ?  ?Cognition Arousal/Alertness: Awake/alert ?Behavior During Therapy: [facility] for tasks assessed/performed ?Overall Cognitive Status: History of cognitive impairments - at baseline ?  ?  ?  ?  ?  ?  ?  ?  ?  ?  ?  ?  ?  ?  ?  ?  ?General Comments: Pt was AxO x 3  aware that she was wet and able to express needs.  "I'm cold" and likes to repeat "I'm 88 years old".  Cute ?  ?  ? ?  ?Exercises   ? ?  ?General Comments   ?  ?  ? ?Pertinent Vitals/Pain Pain Assessment ?Pain Assessment: No/denies pain  ? ? ?Home Living   ?  ?  ?  ?  ?  ?  ?  ?  ?  ?   ?  ?Prior Function    ?  ?  ?   ? ?PT Goals (current goals can now be found in the care plan section) Progress towards PT goals: Progressing toward goals ? ?  ?Frequency ? ? ? Min 3X/week ? ? ? ?  ?PT Plan Current plan remains appropriate  ? ? ?Co-evaluation   ?  ?  ?  ?  ? ?  ?AM-PAC PT "6 Clicks" Mobility   ?Outcome  Measure ? Help needed turning from your back to your side while in a flat bed without using bedrails?: A Lot ?Help needed moving from lying on your back to sitting on the side of a flat bed without using bedrails?: A Lot ?Help needed moving to and from a bed to a chair (including a wheelchair)?: A Lot ?Help needed standing up from a chair using your arms (e.g., wheelchair or bedside chair)?: A Lot ?Help needed to walk in hospital room?: A Lot ?Help needed climbing 3-5 steps with a railing? : Total ?6 Click Score: 11 ? ?  ?End of Session Equipment Utilized During Treatment: Gait belt ?Activity Tolerance: Patient tolerated treatment well ?Patient left: with call bell/phone within reach;with bed alarm set;in chair ?Nurse Communication: Mobility status ?PT Visit Diagnosis: Unsteadiness on feet (R26.81);Muscle weakness (generalized) (M62.81);Difficulty in walking, not elsewhere classified (R26.2) ?  ? ? ?Time: 6269-4854 ?PT Time Calculation (min) (ACUTE ONLY): 23 min ? ?Charges:  $Gait Training: 8-22 mins ?$Therapeutic Activity: 8-22 mins          ?          ? ?Rica Koyanagi  PTA ?Acute  Rehabilitation Services ?Pager      [phone] ?Office      [phone] ? ?

## 2021-10-16 NOTE — Discharge Instructions (Signed)
Advised to follow up PCP in one week. ?Advised to continue current medications as prescribed. ?

## 2021-10-16 NOTE — Plan of Care (Signed)
  Problem: Education: Goal: Knowledge of General Education information will improve Description: Including pain rating scale, medication(s)/side effects and non-pharmacologic comfort measures Outcome: Progressing   Problem: Safety: Goal: Ability to remain free from injury will improve Outcome: Progressing   Problem: Skin Integrity: Goal: Risk for impaired skin integrity will decrease Outcome: Progressing   

## 2021-10-16 NOTE — Progress Notes (Signed)
?Location:   [facility] ?Nursing Home Room Number: 40 A ?Place of Service:  SNF (31) ?Provider:  Elienai Gailey X, NP ? ?Crist Infante, MD ? ?Patient Care Team: ?Crist Infante, MD as PCP - General (Internal Medicine) ? ?Extended Emergency Contact Information ?Primary Emergency Contact: SEVERINO,DEAN ?Mobile Phone: [phone] ?Relation: Grandson ?Interpreter needed? No ?Secondary Emergency Contact: Tisdell,(DIL)[name] ?Home Phone: [phone] ?Mobile Phone: [phone] ?Relation: Relative ? ?Code Status:  DNR ?Goals of care: Advanced Directive information ? ?  10/20/2021  ?  1:36 PM  ?Advanced Directives  ?Does Patient Have a Medical Advance Directive? No  ?Would patient like information on creating a medical advance directive? No - Patient declined  ? ? ? ?Chief Complaint  ?Patient presents with  ? Acute Visit  ?  Medication review  ? ? ?HPI:  ?Pt is a 88 y.o. female seen today for an acute visit for medication review following hospital stay. ? Hospitalized 10/10/21-10/16/21 after she was found on the floor at home for unknown amount of time.  The patient was found to have UTI-treated with Rocephin then changed to Keflex, and dehydration-corrected with IVF, rhabdomyolysis was improved. CPK trended down,   subacute pelvic-right superior pubic ramus fx, T12, L1  fx. F/u BMP/CBC in one week. Prn Norco for pain. ? Dysphagia III diet ? Dementia, intermittent agitation ? Chronic Afib, heart rate is in control, takes ASA '81mg'$  qd ? HTN, takes Cozaar, Bun/creat 15/0.61 10/16/21 ? T2DM, on diet, Hgb a1c 6.8 10/11/21 ? OP, takes Prolia ? Depression/Anxiety, takes Mirtazapine  ? ? ?Past Medical History:  ?Diagnosis Date  ? Anxiety   ? Arthritis   ? Cancer [facility])   ? hx of skin cancer removal on hand   ? Colitis   ? Diabetes mellitus without complication ([place])   ? Gallstones   ? Hyperlipidemia   ? Hypertension   ? IBS (irritable bowel syndrome)   ? Osteoporosis   ? ?Past Surgical History:  ?Procedure Laterality Date  ?  APPENDECTOMY    ? BACK SURGERY    ? x2  ? cataract surgery     ? bilateral   ? CHOLECYSTECTOMY  1999  ? EYE SURGERY    ? for glaucoma   ? left eye peeling surgery     ? left femur bone surgery     ? 1978  ? REVERSE SHOULDER ARTHROPLASTY Right 10/10/2014  ? Procedure: RIGHT REVERSE SHOULDER ARTHROPLASTY;  Surgeon: Justice Britain, MD;  Location: [place];  Service: Orthopedics;  Laterality: Right;  ? ROTATOR CUFF REPAIR    ? TOTAL HIP ARTHROPLASTY Left 02/20/2014  ? Procedure: LEFT TOTAL HIP ARTHROPLASTY ANTERIOR APPROACH;  Surgeon: Gearlean Alf, MD;  Location: [facility];  Service: Orthopedics;  Laterality: Left;  ? WRIST SURGERY    ? left   ? ? ?Allergies  ?Allergen Reactions  ? Robaxin [Methocarbamol] Rash and Other (See Comments)  ?  Red rash on right side of face and forehead cleared after benadryl given, per [name], RN  ? Ancef [Cefazolin] Diarrhea  ? Dilaudid [Hydromorphone] Other (See Comments)  ?  Stomach upset  ? Fosamax [Alendronate] Other (See Comments)  ?  Upset stomach  ? Hyoscyamine Other (See Comments)  ?  "Caused gas"  ? Morphine And Related Itching  ? Nsaids Diarrhea  ? Parathyroid Hormone (Recomb) Other (See Comments)  ?  was not able to give herself the shot  ? Penicillins Itching  ?  Tolerated Unasyn ?Has patient had a PCN reaction  causing immediate rash, facial/tongue/throat swelling, SOB or lightheadedness with hypotension: Yes ?Has patient had a PCN reaction causing severe rash involving mucus membranes or skin necrosis: No ?Has patient had a PCN reaction that required hospitalization: Was in hospital ?Has patient had a PCN reaction occurring within the last 10 years: No ?If all of the above answers are "NO", then may proceed with Cephalosporin use. ?  ? Tape Other (See Comments)  ?  Bandages and leads cause bruising and weeping at sites  ? Flurbiprofen Diarrhea  ? ? ?Allergies as of 10/16/2021   ? ?   Reactions  ? Robaxin [methocarbamol] Rash, Other (See Comments)  ? Red rash on right side of face and  forehead cleared after benadryl given, per [name], RN  ? Ancef [cefazolin] Diarrhea  ? Dilaudid [hydromorphone] Other (See Comments)  ? Stomach upset  ? Fosamax [alendronate] Other (See Comments)  ? Upset stomach  ? Hyoscyamine Other (See Comments)  ? "Caused gas"  ? Morphine And Related Itching  ? Nsaids Diarrhea  ? Parathyroid Hormone (recomb) Other (See Comments)  ? was not able to give herself the shot  ? Penicillins Itching  ? Tolerated Unasyn ?Has patient had a PCN reaction causing immediate rash, facial/tongue/throat swelling, SOB or lightheadedness with hypotension: Yes ?Has patient had a PCN reaction causing severe rash involving mucus membranes or skin necrosis: No ?Has patient had a PCN reaction that required hospitalization: Was in hospital ?Has patient had a PCN reaction occurring within the last 10 years: No ?If all of the above answers are "NO", then may proceed with Cephalosporin use.  ? Tape Other (See Comments)  ? Bandages and leads cause bruising and weeping at sites  ? Flurbiprofen Diarrhea  ? ?  ? ?  ?Medication List  ?  ? ? Notice   ?This visit is on the same day as an admission, and a visit start time could not be determined. If the visit took place after discharge, manually review the med list with the patient. ?  ? ? ?Review of Systems  ?Constitutional:  Positive for appetite change and fatigue.  ?HENT:  Positive for hearing loss and trouble swallowing. Negative for congestion.   ?Eyes:  Positive for visual disturbance.  ?     Blind right eye.   ?Respiratory:  Negative for cough, shortness of breath and wheezing.   ?Cardiovascular:  Negative for leg swelling.  ?Gastrointestinal:  Negative for abdominal pain, constipation, nausea and vomiting.  ?Genitourinary:  Positive for frequency. Negative for dysuria and urgency.  ?Musculoskeletal:  Positive for arthralgias, back pain and gait problem.  ?Skin:  Negative for color change.  ?Neurological:  Negative for speech difficulty, weakness and  headaches.  ?     Memory difficulty.   ?Psychiatric/Behavioral:  Positive for behavioral problems, confusion and sleep disturbance. The patient is nervous/anxious.   ? ?Immunization History  ?Administered Date(s) Administered  ? Influenza Split 07/04/2009, 04/09/2010, 05/06/2011, 03/29/2012, 05/03/2013  ? Influenza, High Dose Seasonal PF 05/02/2013, 05/25/2017, 04/26/2018, 04/24/2019  ? Influenza, Quadrivalent, Recombinant, Inj, Pf 04/26/2020  ? Influenza,inj,Quad PF,6+ Mos 05/26/2021  ? Influenza,inj,quad, With Preservative 04/18/2017  ? Influenza-Unspecified 05/12/2015  ? PFIZER(Purple Top)SARS-COV-2 Vaccination 08/25/2019, 09/19/2019  ? Pneumococcal Conjugate-13 01/29/2014  ? Pneumococcal Polysaccharide-23 06/07/2007  ? Td 05/02/2003  ? Tdap 10/18/2011  ? Zoster, Live 01/17/2006  ? ?Pertinent  Health Maintenance Due  ?Topic Date Due  ? FOOT EXAM  Never done  ? OPHTHALMOLOGY EXAM  Never done  ? DEXA SCAN  Never done  ? INFLUENZA VACCINE  02/16/2022  ? HEMOGLOBIN A1C  04/13/2022  ? ? ?  10/14/2021  ?  8:15 AM 10/14/2021  ?  7:30 PM 10/15/2021  ?  7:42 AM 10/16/2021  ? 12:00 AM 10/16/2021  ?  9:00 AM  ?Fall Risk  ?Patient Fall Risk Level High fall risk High fall risk High fall risk High fall risk High fall risk  ? ?Functional Status Survey: ?  ? ?There were no vitals filed for this visit. ?There is no height or weight on file to calculate BMI. ?Physical Exam ?Nursing note reviewed.  ?Constitutional:   ?   Comments: frail  ?HENT:  ?   Head: Normocephalic and atraumatic.  ?   Nose: Nose normal.  ?   Mouth/Throat:  ?   Mouth: Mucous membranes are moist.  ?Eyes:  ?   Extraocular Movements: Extraocular movements intact.  ?   Conjunctiva/sclera: Conjunctivae normal.  ?   Pupils: Pupils are equal, round, and reactive to light.  ?Cardiovascular:  ?   Rate and Rhythm: Normal rate and regular rhythm.  ?   Heart sounds: No murmur heard. ?Pulmonary:  ?   Effort: Pulmonary effort is normal.  ?   Breath sounds: No wheezing or rales.   ?Abdominal:  ?   General: Bowel sounds are normal.  ?   Palpations: Abdomen is soft.  ?   Tenderness: There is no abdominal tenderness.  ?Musculoskeletal:     ?   General: Tenderness present.  ?   Cervical back: Normal

## 2021-10-19 ENCOUNTER — Encounter: Payer: Self-pay | Admitting: Nurse Practitioner

## 2021-10-19 DIAGNOSIS — F0390 Unspecified dementia without behavioral disturbance: Secondary | ICD-10-CM | POA: Insufficient documentation

## 2021-10-19 DIAGNOSIS — H548 Legal blindness, as defined in USA: Secondary | ICD-10-CM | POA: Insufficient documentation

## 2021-10-19 DIAGNOSIS — I482 Chronic atrial fibrillation, unspecified: Secondary | ICD-10-CM | POA: Insufficient documentation

## 2021-10-19 DIAGNOSIS — F0150 Vascular dementia without behavioral disturbance: Secondary | ICD-10-CM | POA: Insufficient documentation

## 2021-10-19 DIAGNOSIS — M810 Age-related osteoporosis without current pathological fracture: Secondary | ICD-10-CM | POA: Insufficient documentation

## 2021-10-19 DIAGNOSIS — M159 Polyosteoarthritis, unspecified: Secondary | ICD-10-CM | POA: Insufficient documentation

## 2021-10-19 DIAGNOSIS — R1310 Dysphagia, unspecified: Secondary | ICD-10-CM | POA: Insufficient documentation

## 2021-10-19 NOTE — Assessment & Plan Note (Signed)
Hgb a1c 6.8 10/11/21, diet control, monitor CBG in am ?

## 2021-10-19 NOTE — Assessment & Plan Note (Signed)
Obtain MMSE ?

## 2021-10-19 NOTE — Assessment & Plan Note (Signed)
Continue Prolia

## 2021-10-19 NOTE — Assessment & Plan Note (Signed)
subacute pelvic-right superior pubic ramus fx, T12, L1  fx. F/u BMP/CBC in one week. Prn Norco for pain. ?

## 2021-10-19 NOTE — Assessment & Plan Note (Signed)
Right eye.  

## 2021-10-19 NOTE — Assessment & Plan Note (Signed)
Irritable at times, continue Mirtazapine for now.  ?

## 2021-10-19 NOTE — Assessment & Plan Note (Signed)
heart rate is in control, takes ASA '81mg'$  qd ?

## 2021-10-19 NOTE — Assessment & Plan Note (Signed)
Blood pressure is controlled,  takes Cozaar, Bun/creat 15/0.61 10/16/21 ?

## 2021-10-19 NOTE — Assessment & Plan Note (Signed)
Dysphagia III diet.  

## 2021-10-20 ENCOUNTER — Encounter: Payer: Self-pay | Admitting: Internal Medicine

## 2021-10-20 ENCOUNTER — Non-Acute Institutional Stay (SKILLED_NURSING_FACILITY): Payer: Medicare Other | Admitting: Internal Medicine

## 2021-10-20 DIAGNOSIS — E0800 Diabetes mellitus due to underlying condition with hyperosmolarity without nonketotic hyperglycemic-hyperosmolar coma (NKHHC): Secondary | ICD-10-CM | POA: Diagnosis not present

## 2021-10-20 DIAGNOSIS — F03C11 Unspecified dementia, severe, with agitation: Secondary | ICD-10-CM | POA: Diagnosis not present

## 2021-10-20 DIAGNOSIS — M159 Polyosteoarthritis, unspecified: Secondary | ICD-10-CM | POA: Diagnosis not present

## 2021-10-20 DIAGNOSIS — M6282 Rhabdomyolysis: Secondary | ICD-10-CM | POA: Diagnosis not present

## 2021-10-20 DIAGNOSIS — I10 Essential (primary) hypertension: Secondary | ICD-10-CM

## 2021-10-20 DIAGNOSIS — M810 Age-related osteoporosis without current pathological fracture: Secondary | ICD-10-CM | POA: Diagnosis not present

## 2021-10-20 NOTE — Progress Notes (Signed)
?Provider:  Veleta Miners MD ?Location:   [facility] ?Nursing Home Room Number: 40 ?Place of Service:  SNF (31) ? ?PCP: Crist Infante, MD ?Patient Care Team: ?Crist Infante, MD as PCP - General (Internal Medicine) ? ?Extended Emergency Contact Information ?Primary Emergency Contact: SEVERINO,DEAN ?Mobile Phone: [phone] ?Relation: Grandson ?Interpreter needed? No ?Secondary Emergency Contact: Donna,(DIL)[name] ?Home Phone: [phone] ?Mobile Phone: [phone] ?Relation: Relative ? ?Code Status: DNR ?Goals of Care: Advanced Directive information ? ?  10/20/2021  ?  1:36 PM  ?Advanced Directives  ?Does Patient Have a Medical Advance Directive? No  ?Would patient like information on creating a medical advance directive? No - Patient declined  ? ? ? ? ?Chief Complaint  ?Patient presents with  ? New Admit To SNF  ?  Admission to SNF  ? ? ?HPI: Patient is a 88 y.o. female seen today for admission to SNF for Therapy ? ?Admitted in the hospital from 03/25-03/31 for Fall and Rhabdomyolysis and dehydration ? ?Patient has h/o Dementia.Hypertension and Diabetes ? Lives alone in her house. Was found on the floor by her family ?ED had Rhabdomyolysis CPK peaked at 1234 ?UTI got Antibiotics ?T 11 Compression fracture and subacute Pelvic fracture ?CT head negative for acute changes ? ?Now in SNF for therapy ?Continues to be very agitated ?Constantly wandering in her Wheelchair ?Upsetting staff and other residents ?Wants to call her Son inlaw ?Gets upset ?Unable to get much history ? ? ? ? ?Past Medical History:  ?Diagnosis Date  ? Anxiety   ? Arthritis   ? Cancer [facility])   ? hx of skin cancer removal on hand   ? Colitis   ? Diabetes mellitus without complication ([place])   ? Gallstones   ? Hyperlipidemia   ? Hypertension   ? IBS (irritable bowel syndrome)   ? Osteoporosis   ? ?Past Surgical History:  ?Procedure Laterality Date  ? APPENDECTOMY    ? BACK SURGERY    ? x2  ? cataract surgery     ? bilateral   ? CHOLECYSTECTOMY   1999  ? EYE SURGERY    ? for glaucoma   ? left eye peeling surgery     ? left femur bone surgery     ? 1978  ? REVERSE SHOULDER ARTHROPLASTY Right 10/10/2014  ? Procedure: RIGHT REVERSE SHOULDER ARTHROPLASTY;  Surgeon: Justice Britain, MD;  Location: [place];  Service: Orthopedics;  Laterality: Right;  ? ROTATOR CUFF REPAIR    ? TOTAL HIP ARTHROPLASTY Left 02/20/2014  ? Procedure: LEFT TOTAL HIP ARTHROPLASTY ANTERIOR APPROACH;  Surgeon: Gearlean Alf, MD;  Location: [facility];  Service: Orthopedics;  Laterality: Left;  ? WRIST SURGERY    ? left   ? ? reports that she has never smoked. She has never used smokeless tobacco. She reports current alcohol use. She reports that she does not use drugs. ?Social History  ? ?Socioeconomic History  ? Marital status: Married  ?  Spouse name: Not on file  ? Number of children: Not on file  ? Years of education: Not on file  ? Highest education level: Not on file  ?Occupational History  ? Not on file  ?Tobacco Use  ? Smoking status: Never  ? Smokeless tobacco: Never  ?Vaping Use  ? Vaping Use: Never used  ?Substance and Sexual Activity  ? Alcohol use: Yes  ?  Comment: rare glass of wine   ? Drug use: No  ? Sexual activity: Not on file  ?Other Topics  Concern  ? Not on file  ?Social History Narrative  ? 3 cups of coffee a day  ? ?Social Determinants of Health  ? ?Financial Resource Strain: Not on file  ?Food Insecurity: Not on file  ?Transportation Needs: Not on file  ?Physical Activity: Not on file  ?Stress: Not on file  ?Social Connections: Not on file  ?Intimate Partner Violence: Not on file  ? ? ?Functional Status Survey: ?  ? ?Family History  ?Problem Relation Age of Onset  ? Stroke Mother   ? Hypertension Mother   ? Ovarian cancer Sister   ? ? ?Health Maintenance  ?Topic Date Due  ? FOOT EXAM  Never done  ? OPHTHALMOLOGY EXAM  Never done  ? Zoster Vaccines- Shingrix (1 of 2) Never done  ? DEXA SCAN  Never done  ? COVID-19 Vaccine (3 - Pfizer risk series) 10/17/2019  ? TETANUS/TDAP   10/17/2021  ? INFLUENZA VACCINE  02/16/2022  ? HEMOGLOBIN A1C  04/13/2022  ? Pneumonia Vaccine 32+ Years old  Completed  ? HPV VACCINES  Aged Out  ? ? ?Allergies  ?Allergen Reactions  ? Robaxin [Methocarbamol] Rash and Other (See Comments)  ?  Red rash on right side of face and forehead cleared after benadryl given, per [name], RN  ? Ancef [Cefazolin] Diarrhea  ? Dilaudid [Hydromorphone] Other (See Comments)  ?  Stomach upset  ? Fosamax [Alendronate] Other (See Comments)  ?  Upset stomach  ? Hyoscyamine Other (See Comments)  ?  "Caused gas"  ? Morphine And Related Itching  ? Nsaids Diarrhea  ? Parathyroid Hormone (Recomb) Other (See Comments)  ?  was not able to give herself the shot  ? Penicillins Itching  ?  Tolerated Unasyn ?Has patient had a PCN reaction causing immediate rash, facial/tongue/throat swelling, SOB or lightheadedness with hypotension: Yes ?Has patient had a PCN reaction causing severe rash involving mucus membranes or skin necrosis: No ?Has patient had a PCN reaction that required hospitalization: Was in hospital ?Has patient had a PCN reaction occurring within the last 10 years: No ?If all of the above answers are "NO", then may proceed with Cephalosporin use. ?  ? Tape Other (See Comments)  ?  Bandages and leads cause bruising and weeping at sites  ? Flurbiprofen Diarrhea  ? ? ?Allergies as of 10/20/2021   ? ?   Reactions  ? Robaxin [methocarbamol] Rash, Other (See Comments)  ? Red rash on right side of face and forehead cleared after benadryl given, per [name], RN  ? Ancef [cefazolin] Diarrhea  ? Dilaudid [hydromorphone] Other (See Comments)  ? Stomach upset  ? Fosamax [alendronate] Other (See Comments)  ? Upset stomach  ? Hyoscyamine Other (See Comments)  ? "Caused gas"  ? Morphine And Related Itching  ? Nsaids Diarrhea  ? Parathyroid Hormone (recomb) Other (See Comments)  ? was not able to give herself the shot  ? Penicillins Itching  ? Tolerated Unasyn ?Has patient had a PCN reaction causing  immediate rash, facial/tongue/throat swelling, SOB or lightheadedness with hypotension: Yes ?Has patient had a PCN reaction causing severe rash involving mucus membranes or skin necrosis: No ?Has patient had a PCN reaction that required hospitalization: Was in hospital ?Has patient had a PCN reaction occurring within the last 10 years: No ?If all of the above answers are "NO", then may proceed with Cephalosporin use.  ? Tape Other (See Comments)  ? Bandages and leads cause bruising and weeping at sites  ? Flurbiprofen Diarrhea  ? ?  ? ?  ?  Medication List  ?  ? ?  ? Accurate as of October 20, 2021  1:37 PM. If you have any questions, ask your nurse or doctor.  ?  ?  ? ?  ? ?STOP taking these medications   ? ?metFORMIN 500 MG tablet ?Commonly known as: GLUCOPHAGE ?Stopped by: Virgie Dad, MD ?  ? ?  ? ?TAKE these medications   ? ?aspirin EC 81 MG tablet ?Take 81 mg by mouth daily. ?  ?denosumab 60 MG/ML Sosy injection ?Commonly known as: PROLIA ?Inject 60 mg into the skin every 6 (six) months. Last injection 06/02/21 ?  ?DSS 100 MG Caps ?Take 100 mg by mouth 2 (two) times daily. ?  ?gabapentin 300 MG capsule ?Commonly known as: NEURONTIN ?Take 1 capsule (300 mg total) by mouth 3 (three) times daily. ?  ?lactose free nutrition Liqd ?Take 237 mLs by mouth 2 (two) times daily between meals. ?  ?LORazepam 0.5 MG tablet ?Commonly known as: ATIVAN ?Take 0.5 mg by mouth once. ?  ?losartan 50 MG tablet ?Commonly known as: COZAAR ?Take 1 tablet (50 mg total) by mouth daily. ?  ?MELATONIN PO ?Take 1 tablet by mouth at bedtime as needed (sleep). ?  ?mirtazapine 15 MG tablet ?Commonly known as: REMERON ?Take 15 mg by mouth at bedtime. ?  ?oxyCODONE-acetaminophen 10-325 MG tablet ?Commonly known as: PERCOCET ?Take 1 tablet by mouth every 6 (six) hours as needed. ?  ?PreserVision AREDS 2 Caps ?Take 1 capsule by mouth daily. ?  ? ?  ? ? ?Review of Systems  ?Constitutional:  Positive for activity change. Negative for appetite change.   ?HENT: Negative.    ?Respiratory:  Negative for cough and shortness of breath.   ?Cardiovascular:  Negative for leg swelling.  ?Gastrointestinal:  Negative for constipation.  ?Genitourinary: Negative.

## 2021-10-23 LAB — BASIC METABOLIC PANEL
BUN: 28 — AB (ref 4–21)
CO2: 25 — AB (ref 13–22)
Chloride: 103 (ref 99–108)
Creatinine: 1 (ref 0.5–1.1)
Glucose: 105
Potassium: 5.3 mEq/L — AB (ref 3.5–5.1)
Sodium: 138 (ref 137–147)

## 2021-10-23 LAB — CBC: RBC: 4.18 (ref 3.87–5.11)

## 2021-10-23 LAB — CBC AND DIFFERENTIAL
HCT: 40 (ref 36–46)
Hemoglobin: 13 (ref 12.0–16.0)
Neutrophils Absolute: 4766
Platelets: 450 10*3/uL — AB (ref 150–400)
WBC: 7.7

## 2021-10-23 LAB — COMPREHENSIVE METABOLIC PANEL
Albumin: 3.6 (ref 3.5–5.0)
Calcium: 9.9 (ref 8.7–10.7)
Globulin: 2.7

## 2021-10-23 LAB — HEPATIC FUNCTION PANEL
ALT: 17 U/L (ref 7–35)
AST: 18 (ref 13–35)
Alkaline Phosphatase: 60 (ref 25–125)
Bilirubin, Total: 0.2

## 2021-10-28 ENCOUNTER — Other Ambulatory Visit: Payer: Self-pay | Admitting: Orthopedic Surgery

## 2021-11-09 ENCOUNTER — Non-Acute Institutional Stay (SKILLED_NURSING_FACILITY): Payer: Medicare Other | Admitting: Nurse Practitioner

## 2021-11-09 ENCOUNTER — Encounter: Payer: Self-pay | Admitting: Nurse Practitioner

## 2021-11-09 DIAGNOSIS — M810 Age-related osteoporosis without current pathological fracture: Secondary | ICD-10-CM | POA: Diagnosis not present

## 2021-11-09 DIAGNOSIS — I10 Essential (primary) hypertension: Secondary | ICD-10-CM | POA: Diagnosis not present

## 2021-11-09 DIAGNOSIS — S32591A Other specified fracture of right pubis, initial encounter for closed fracture: Secondary | ICD-10-CM

## 2021-11-09 DIAGNOSIS — F0394 Unspecified dementia, unspecified severity, with anxiety: Secondary | ICD-10-CM

## 2021-11-09 DIAGNOSIS — E0800 Diabetes mellitus due to underlying condition with hyperosmolarity without nonketotic hyperglycemic-hyperosmolar coma (NKHHC): Secondary | ICD-10-CM | POA: Diagnosis not present

## 2021-11-09 DIAGNOSIS — F03C11 Unspecified dementia, severe, with agitation: Secondary | ICD-10-CM

## 2021-11-09 DIAGNOSIS — R1310 Dysphagia, unspecified: Secondary | ICD-10-CM

## 2021-11-09 DIAGNOSIS — I482 Chronic atrial fibrillation, unspecified: Secondary | ICD-10-CM | POA: Diagnosis not present

## 2021-11-09 NOTE — Progress Notes (Addendum)
?Location:  [facility] ?Nursing Home Room Number: NO/40/A ?Place of Service:  SNF (31) ?Provider:  Seville Downs X, NP ? ? ?Crist Infante, MD ? ?Patient Care Team: ?Crist Infante, MD as PCP - General (Internal Medicine) ? ?Extended Emergency Contact Information ?Primary Emergency Contact: SEVERINO,DEAN ?Mobile Phone: [phone] ?Relation: Grandson ?Interpreter needed? No ?Secondary Emergency Contact: Goldring,(DIL)[name] ?Home Phone: [phone] ?Mobile Phone: [phone] ?Relation: Relative ? ?Code Status:  DNR ?Goals of care: Advanced Directive information ? ?  11/09/2021  ? 11:49 AM  ?Advanced Directives  ?Does Patient Have a Medical Advance Directive? Yes  ?Type of Advance Directive Out of facility DNR (pink MOST or yellow form)  ?Does patient want to make changes to medical advance directive? No - Patient declined  ? ? ? ?Chief Complaint  ?Patient presents with  ?? Acute Visit  ?  Patient is being seen for insomnia and irrability  ?? Health Maintenance  ?  Patient also needs dexa scan, TDAP, and Ophthalmology exam  ? ? ?HPI:  ?Pt is a 88 y.o. female seen today for an acute visit for c/o not sleeping well at night, frequent awakes.  ? ?Subacute pelvic-right superior pubic ramus fx, T12, L1 Prn Norco, Gabapentin for pain. ?            Dysphagia III diet ?            Dementia, intermittent agitation ?            Chronic Afib, heart rate is in control, takes ASA '81mg'$  qd ?            HTN, takes Cozaar, Bun/creat 28/0.95 10/22/21 ?            T2DM, on diet, Hgb a1c 6.8 10/11/21 ?            OP, takes Prolia ?            Depression/Anxiety, takes Mirtazapine, stared Seroquel 12.'5mg'$  bid prn Lorazepam 10/20/21 ?  ? ?Past Medical History:  ?Diagnosis Date  ?? Anxiety   ?? Arthritis   ?? Cancer [facility])   ? hx of skin cancer removal on hand   ?? Colitis   ?? Diabetes mellitus without complication ([place])   ?? Gallstones   ?? Hyperlipidemia   ?? Hypertension   ?? IBS (irritable bowel syndrome)   ?? Osteoporosis   ? ?Past  Surgical History:  ?Procedure Laterality Date  ?? APPENDECTOMY    ?? BACK SURGERY    ? x2  ?? cataract surgery     ? bilateral   ?? CHOLECYSTECTOMY  1999  ?? EYE SURGERY    ? for glaucoma   ?? left eye peeling surgery     ?? left femur bone surgery     ? 1978  ?? REVERSE SHOULDER ARTHROPLASTY Right 10/10/2014  ? Procedure: RIGHT REVERSE SHOULDER ARTHROPLASTY;  Surgeon: Justice Britain, MD;  Location: [place];  Service: Orthopedics;  Laterality: Right;  ?? ROTATOR CUFF REPAIR    ?? TOTAL HIP ARTHROPLASTY Left 02/20/2014  ? Procedure: LEFT TOTAL HIP ARTHROPLASTY ANTERIOR APPROACH;  Surgeon: Gearlean Alf, MD;  Location: [facility];  Service: Orthopedics;  Laterality: Left;  ?? WRIST SURGERY    ? left   ? ? ?Allergies  ?Allergen Reactions  ?? Robaxin [Methocarbamol] Rash and Other (See Comments)  ?  Red rash on right side of face and forehead cleared after benadryl given, per [name], RN  ?? Ancef [Cefazolin] Diarrhea  ?? Dilaudid [Hydromorphone] Other (  See Comments)  ?  Stomach upset  ?? Fosamax [Alendronate] Other (See Comments)  ?  Upset stomach  ?? Hyoscyamine Other (See Comments)  ?  "Caused gas"  ?? Morphine And Related Itching  ?? Nsaids Diarrhea  ?? Parathyroid Hormone (Recomb) Other (See Comments)  ?  was not able to give herself the shot  ?? Penicillins Itching  ?  Tolerated Unasyn ?Has patient had a PCN reaction causing immediate rash, facial/tongue/throat swelling, SOB or lightheadedness with hypotension: Yes ?Has patient had a PCN reaction causing severe rash involving mucus membranes or skin necrosis: No ?Has patient had a PCN reaction that required hospitalization: Was in hospital ?Has patient had a PCN reaction occurring within the last 10 years: No ?If all of the above answers are "NO", then may proceed with Cephalosporin use. ?  ?? Tape Other (See Comments)  ?  Bandages and leads cause bruising and weeping at sites  ?? Flurbiprofen Diarrhea  ? ? ?Outpatient Encounter Medications as of 11/09/2021  ?Medication Sig   ?? aspirin EC 81 MG tablet Take 81 mg by mouth daily.  ?? denosumab (PROLIA) 60 MG/ML SOSY injection Inject 60 mg into the skin every 6 (six) months. Last injection 06/02/21  ?? docusate sodium 100 MG CAPS Take 100 mg by mouth 2 (two) times daily.  ?? gabapentin (NEURONTIN) 300 MG capsule Take 1 capsule (300 mg total) by mouth 3 (three) times daily.  ?? lactose free nutrition (BOOST) LIQD Take 237 mLs by mouth 2 (two) times daily between meals.  ?? losartan (COZAAR) 50 MG tablet Take 1 tablet (50 mg total) by mouth daily.  ?? MELATONIN PO Take 1 tablet by mouth at bedtime as needed (sleep).  ?? mirtazapine (REMERON) 15 MG tablet Take 15 mg by mouth at bedtime.  ?? Multiple Vitamins-Minerals (PRESERVISION AREDS 2) CAPS Take 1 capsule by mouth daily.  ?? oxyCODONE-acetaminophen (PERCOCET) 10-325 MG tablet Take 1 tablet by mouth every 6 (six) hours as needed.  ?? QUEtiapine (SEROQUEL) 25 MG tablet Take 12.5 mg by mouth 2 (two) times daily.  ? ?No facility-administered encounter medications on file as of 11/09/2021.  ? ? ?Review of Systems  ?Constitutional:  Negative for appetite change, fatigue and fever.  ?HENT:  Positive for hearing loss and trouble swallowing. Negative for congestion.   ?Eyes:  Positive for visual disturbance.  ?     Blind right eye.   ?Respiratory:  Negative for cough and shortness of breath.   ?Cardiovascular:  Negative for leg swelling.  ?Gastrointestinal:  Negative for abdominal pain and constipation.  ?Genitourinary:  Positive for frequency. Negative for dysuria and urgency.  ?Musculoskeletal:  Positive for arthralgias, back pain and gait problem.  ?Skin:  Negative for color change.  ?Neurological:  Negative for speech difficulty, weakness and headaches.  ?     Memory difficulty.   ?Psychiatric/Behavioral:  Positive for behavioral problems, confusion and sleep disturbance. The patient is nervous/anxious.   ? ?Immunization History  ?Administered Date(s) Administered  ?? Influenza Split  07/04/2009, 04/09/2010, 05/06/2011, 03/29/2012, 05/03/2013  ?? Influenza, High Dose Seasonal PF 05/02/2013, 05/25/2017, 04/26/2018, 04/24/2019  ?? Influenza, Quadrivalent, Recombinant, Inj, Pf 04/26/2020  ?? Influenza,inj,Quad PF,6+ Mos 05/26/2021  ?? Influenza,inj,quad, With Preservative 04/18/2017  ?? Influenza-Unspecified 05/12/2015  ?? PFIZER(Purple Top)SARS-COV-2 Vaccination 08/25/2019, 09/19/2019  ?? Pneumococcal Conjugate-13 01/29/2014  ?? Pneumococcal Polysaccharide-23 06/07/2007  ?? Td 05/02/2003  ?? Tdap 10/18/2011  ?? Zoster, Live 01/17/2006  ? ?Pertinent  Health Maintenance Due  ?Topic Date Due  ??  FOOT EXAM  Never done  ?? OPHTHALMOLOGY EXAM  Never done  ?? DEXA SCAN  Never done  ?? INFLUENZA VACCINE  02/16/2022  ?? HEMOGLOBIN A1C  04/13/2022  ? ? ?  10/14/2021  ?  8:15 AM 10/14/2021  ?  7:30 PM 10/15/2021  ?  7:42 AM 10/16/2021  ? 12:00 AM 10/16/2021  ?  9:00 AM  ?Fall Risk  ?Patient Fall Risk Level High fall risk High fall risk High fall risk High fall risk High fall risk  ? ?Functional Status Survey: ?  ? ?Vitals:  ? 11/10/21 1609  ?BP: (!) 144/86  ?Pulse: 75  ?Resp: 17  ?Temp: 97.7 ?F (36.5 ?C)  ?SpO2: 96%  ?Weight: 143 lb (64.9 kg)  ?Height: '5\' 2"'$  (1.575 m)  ? ?Body mass index is 26.16 kg/m?Marland Kitchen ?Physical Exam ?Nursing note reviewed.  ?Constitutional:   ?   Appearance: Normal appearance.  ?HENT:  ?   Head: Normocephalic and atraumatic.  ?   Nose: Nose normal.  ?   Mouth/Throat:  ?   Mouth: Mucous membranes are moist.  ?Eyes:  ?   Extraocular Movements: Extraocular movements intact.  ?   Conjunctiva/sclera: Conjunctivae normal.  ?   Pupils: Pupils are equal, round, and reactive to light.  ?Cardiovascular:  ?   Rate and Rhythm: Normal rate and regular rhythm.  ?   Heart sounds: No murmur heard. ?Pulmonary:  ?   Effort: Pulmonary effort is normal.  ?   Breath sounds: No wheezing or rales.  ?Abdominal:  ?   General: Bowel sounds are normal.  ?   Palpations: Abdomen is soft.  ?   Tenderness: There is no  abdominal tenderness.  ?Musculoskeletal:  ?   Cervical back: Normal range of motion and neck supple.  ?   Right lower leg: No edema.  ?   Left lower leg: No edema.  ?   Comments: Right groin/thigh/back pain is better  ?Skin:

## 2021-11-10 ENCOUNTER — Encounter: Payer: Self-pay | Admitting: Nurse Practitioner

## 2021-11-10 NOTE — Assessment & Plan Note (Signed)
Dysphagia III diet.  

## 2021-11-10 NOTE — Assessment & Plan Note (Signed)
Chronic Afib, heart rate is in control, takes ASA '81mg'$  qd ?

## 2021-11-10 NOTE — Assessment & Plan Note (Signed)
Subacute pelvic-right superior pubic ramus fx, T12, L1 Prn Norco, Gabapentin for pain. ?

## 2021-11-10 NOTE — Assessment & Plan Note (Signed)
on diet, Hgb a1c 6.8 10/11/21 

## 2021-11-10 NOTE — Assessment & Plan Note (Signed)
c/o not sleeping well at night, frequent awakes. Continue  Mirtazapine, increase Seroquel '25mg'$  pm, continue Seroquel 12.'5mg'$  am,  prn Lorazepam  ?

## 2021-11-10 NOTE — Assessment & Plan Note (Signed)
takes Prolia 

## 2021-11-10 NOTE — Assessment & Plan Note (Signed)
intermittent agitation 

## 2021-11-10 NOTE — Assessment & Plan Note (Addendum)
Blood pressure is controlled,  takes Cozaar, Bun/creat 28/0.95 10/22/21 ?

## 2021-11-16 ENCOUNTER — Non-Acute Institutional Stay (SKILLED_NURSING_FACILITY): Payer: Medicare Other | Admitting: Adult Health

## 2021-11-16 ENCOUNTER — Encounter: Payer: Self-pay | Admitting: Adult Health

## 2021-11-16 DIAGNOSIS — F29 Unspecified psychosis not due to a substance or known physiological condition: Secondary | ICD-10-CM | POA: Diagnosis not present

## 2021-11-16 DIAGNOSIS — F419 Anxiety disorder, unspecified: Secondary | ICD-10-CM

## 2021-11-16 NOTE — Progress Notes (Signed)
? ?Location:  [facility] ?Nursing Home Room Number: 40/A ?Place of Service:  SNF (31) ?Provider:  Durenda Age, DNP, FNP-BC ? ?Patient Care Team: ?Crist Infante, MD as PCP - General (Internal Medicine) ? ?Extended Emergency Contact Information ?Primary Emergency Contact: SEVERINO,DEAN ?Mobile Phone: [phone] ?Relation: Grandson ?Interpreter needed? No ?Secondary Emergency Contact: Sonnenberg,(DIL)[name] ?Home Phone: [phone] ?Mobile Phone: [phone] ?Relation: Relative ? ?Code Status:  DNR ? ?Goals of care: Advanced Directive information ? ?  11/16/2021  ?  3:34 PM  ?Advanced Directives  ?Does Patient Have a Medical Advance Directive? No  ?Type of Advance Directive Out of facility DNR (pink MOST or yellow form)  ?Does patient want to make changes to medical advance directive? No - Patient declined  ? ? ? ?Chief Complaint  ?Patient presents with  ?? Acute Visit  ?  Patient is being seen for agitation  ? ? ?HPI:  ?Pt is a 88 y.o. female seen today for an acute visit regarding agitation. She was reported to be going around the facility bothering everyone and refusing her medications. She was given Ativan 0.5 mg IM X 1 earlier. She was seen in her room today. She was seen lying down on her bed. She stated that she doesn't want food and not to bother her since she wants to rest. She kicked her right foot towards provider and stated that she will have to go against everyone if she gets bothered. No noted coughing nor SOB. No pedal edema. She currently takes Seroquel 12.5 mg in the morning and 25 mg at bedtime for psychosis. She, also, takes Ativan 0.5 mg  PO every 6 hours PRN for anxiety.  ? ? ?Past Medical History:  ?Diagnosis Date  ?? Anxiety   ?? Arthritis   ?? Cancer [facility])   ? hx of skin cancer removal on hand   ?? Colitis   ?? Diabetes mellitus without complication ([place])   ?? Gallstones   ?? Hyperlipidemia   ?? Hypertension   ?? IBS (irritable bowel syndrome)   ?? Osteoporosis   ? ?Past Surgical  History:  ?Procedure Laterality Date  ?? APPENDECTOMY    ?? BACK SURGERY    ? x2  ?? cataract surgery     ? bilateral   ?? CHOLECYSTECTOMY  1999  ?? EYE SURGERY    ? for glaucoma   ?? left eye peeling surgery     ?? left femur bone surgery     ? 1978  ?? REVERSE SHOULDER ARTHROPLASTY Right 10/10/2014  ? Procedure: RIGHT REVERSE SHOULDER ARTHROPLASTY;  Surgeon: Justice Britain, MD;  Location: [place];  Service: Orthopedics;  Laterality: Right;  ?? ROTATOR CUFF REPAIR    ?? TOTAL HIP ARTHROPLASTY Left 02/20/2014  ? Procedure: LEFT TOTAL HIP ARTHROPLASTY ANTERIOR APPROACH;  Surgeon: Gearlean Alf, MD;  Location: [facility];  Service: Orthopedics;  Laterality: Left;  ?? WRIST SURGERY    ? left   ? ? ?Allergies  ?Allergen Reactions  ?? Robaxin [Methocarbamol] Rash and Other (See Comments)  ?  Red rash on right side of face and forehead cleared after benadryl given, per [name], RN  ?? Ancef [Cefazolin] Diarrhea  ?? Dilaudid [Hydromorphone] Other (See Comments)  ?  Stomach upset  ?? Fosamax [Alendronate] Other (See Comments)  ?  Upset stomach  ?? Hyoscyamine Other (See Comments)  ?  "Caused gas"  ?? Morphine And Related Itching  ?? Nsaids Diarrhea  ?? Parathyroid Hormone (Recomb) Other (See Comments)  ?  was not  able to give herself the shot  ?? Penicillins Itching  ?  Tolerated Unasyn ?Has patient had a PCN reaction causing immediate rash, facial/tongue/throat swelling, SOB or lightheadedness with hypotension: Yes ?Has patient had a PCN reaction causing severe rash involving mucus membranes or skin necrosis: No ?Has patient had a PCN reaction that required hospitalization: Was in hospital ?Has patient had a PCN reaction occurring within the last 10 years: No ?If all of the above answers are "NO", then may proceed with Cephalosporin use. ?  ?? Tape Other (See Comments)  ?  Bandages and leads cause bruising and weeping at sites  ?? Flurbiprofen Diarrhea  ? ? ?Outpatient Encounter Medications as of 11/16/2021  ?Medication Sig  ?? aspirin  EC 81 MG tablet Take 81 mg by mouth daily.  ?? denosumab (PROLIA) 60 MG/ML SOSY injection Inject 60 mg into the skin every 6 (six) months. Last injection 06/02/21  ?? docusate sodium 100 MG CAPS Take 100 mg by mouth 2 (two) times daily.  ?? gabapentin (NEURONTIN) 300 MG capsule Take 1 capsule (300 mg total) by mouth 3 (three) times daily.  ?? lactose free nutrition (BOOST) LIQD Take 237 mLs by mouth 2 (two) times daily between meals.  ?? losartan (COZAAR) 50 MG tablet Take 1 tablet (50 mg total) by mouth daily.  ?? MELATONIN PO Take 1 tablet by mouth at bedtime as needed (sleep).  ?? mirtazapine (REMERON) 15 MG tablet Take 15 mg by mouth at bedtime.  ?? Multiple Vitamins-Minerals (PRESERVISION AREDS 2) CAPS Take 1 capsule by mouth daily.  ?? oxyCODONE-acetaminophen (PERCOCET) 10-325 MG tablet Take 1 tablet by mouth every 6 (six) hours as needed.  ?? QUEtiapine (SEROQUEL) 25 MG tablet Take 12.5 mg by mouth 2 (two) times daily.  ? ?No facility-administered encounter medications on file as of 11/16/2021.  ? ?  Obtained from staff ?Review of Systems  ?Constitutional:  Negative for appetite change, chills, fatigue and fever.  ?HENT:  Negative for congestion, hearing loss, rhinorrhea and sore throat.   ?Eyes: Negative.   ?Respiratory:  Negative for cough, shortness of breath and wheezing.   ?Cardiovascular:  Negative for chest pain, palpitations and leg swelling.  ?Gastrointestinal:  Negative for abdominal pain, constipation, diarrhea, nausea and vomiting.  ?Genitourinary:  Negative for dysuria.  ?Musculoskeletal:  Negative for arthralgias, back pain and myalgias.  ?Skin:  Negative for color change, rash and wound.  ?Neurological:  Negative for dizziness, weakness and headaches.  ?Psychiatric/Behavioral:  Positive for agitation and behavioral problems. The patient is nervous/anxious.    ? ? ? ?Immunization History  ?Administered Date(s) Administered  ?? Influenza Split 07/04/2009, 04/09/2010, 05/06/2011, 03/29/2012,  05/03/2013  ?? Influenza, High Dose Seasonal PF 05/02/2013, 05/25/2017, 04/26/2018, 04/24/2019  ?? Influenza, Quadrivalent, Recombinant, Inj, Pf 04/26/2020  ?? Influenza,inj,Quad PF,6+ Mos 05/26/2021  ?? Influenza,inj,quad, With Preservative 04/18/2017  ?? Influenza-Unspecified 05/12/2015  ?? PFIZER(Purple Top)SARS-COV-2 Vaccination 08/25/2019, 09/19/2019  ?? Pneumococcal Conjugate-13 01/29/2014  ?? Pneumococcal Polysaccharide-23 06/07/2007  ?? Td 05/02/2003  ?? Tdap 10/18/2011  ?? Zoster, Live 01/17/2006  ? ?Pertinent  Health Maintenance Due  ?Topic Date Due  ?? FOOT EXAM  Never done  ?? OPHTHALMOLOGY EXAM  Never done  ?? DEXA SCAN  Never done  ?? INFLUENZA VACCINE  02/16/2022  ?? HEMOGLOBIN A1C  04/13/2022  ? ? ?  10/14/2021  ?  8:15 AM 10/14/2021  ?  7:30 PM 10/15/2021  ?  7:42 AM 10/16/2021  ? 12:00 AM 10/16/2021  ?  9:00 AM  ?  Fall Risk  ?Patient Fall Risk Level High fall risk High fall risk High fall risk High fall risk High fall risk  ? ? ? ?Vitals:  ? 11/16/21 1532  ?BP: (!) 143/79  ?Pulse: 76  ?Resp: (!) 22  ?Temp: (!) 96 ?F (35.6 ?C)  ?SpO2: 94%  ?Weight: 123 lb 14.4 oz (56.2 kg)  ?Height: '5\' 2"'$  (1.575 m)  ? ?Body mass index is 22.66 kg/m?. ? ?Physical Exam ?Constitutional:   ?   General: She is not in acute distress. ?   Appearance: Normal appearance.  ?HENT:  ?   Head: Normocephalic and atraumatic.  ?   Nose: Nose normal.  ?   Mouth/Throat:  ?   Mouth: Mucous membranes are moist.  ?Eyes:  ?   Conjunctiva/sclera: Conjunctivae normal.  ?Cardiovascular:  ?   Rate and Rhythm: Normal rate and regular rhythm.  ?Pulmonary:  ?   Effort: Pulmonary effort is normal.  ?   Breath sounds: Normal breath sounds.  ?Abdominal:  ?   General: Bowel sounds are normal.  ?   Palpations: Abdomen is soft.  ?Musculoskeletal:     ?   General: Normal range of motion.  ?   Cervical back: Normal range of motion.  ?Skin: ?   General: Skin is warm and dry.  ?Neurological:  ?   Mental Status: She is alert. Mental status is at baseline.   ?Psychiatric:  ?   Comments: agitated  ?  ? ? ? ?Labs reviewed: ?Recent Labs  ?  10/13/21 ?0704 10/15/21 ?0640 10/16/21 ?0328  ?NA 140 133* 137  ?K 4.1 3.1* 4.0  ?CL 111 101 111  ?CO2 22 23 21*  ?GLUCOSE 113*

## 2021-11-17 ENCOUNTER — Non-Acute Institutional Stay (SKILLED_NURSING_FACILITY): Payer: Medicare Other | Admitting: Internal Medicine

## 2021-11-17 ENCOUNTER — Encounter: Payer: Self-pay | Admitting: Internal Medicine

## 2021-11-17 DIAGNOSIS — F03C11 Unspecified dementia, severe, with agitation: Secondary | ICD-10-CM

## 2021-11-17 DIAGNOSIS — M159 Polyosteoarthritis, unspecified: Secondary | ICD-10-CM | POA: Diagnosis not present

## 2021-11-17 NOTE — Progress Notes (Signed)
?Location:   [facility] ?Nursing Home Room Number: 40 ?Place of Service:  SNF (31) ?Provider:  Veleta Miners MD ? ?Crist Infante, MD ? ?Patient Care Team: ?Crist Infante, MD as PCP - General (Internal Medicine) ? ?Extended Emergency Contact Information ?Primary Emergency Contact: SEVERINO,DEAN ?Mobile Phone: [phone] ?Relation: Grandson ?Interpreter needed? No ?Secondary Emergency Contact: Moskowitz,(DIL)[name] ?Home Phone: [phone] ?Mobile Phone: [phone] ?Relation: Relative ? ?Code Status:  DNR ?Goals of care: Advanced Directive information ? ?  11/17/2021  ? 11:40 AM  ?Advanced Directives  ?Does Patient Have a Medical Advance Directive? Yes  ?Type of Paramedic of [place];Living will;Out of facility DNR (pink MOST or yellow form)  ?Does patient want to make changes to medical advance directive? No - Patient declined  ?Copy of [place] in Chart? Yes - validated most recent copy scanned in chart (See row information)  ?Pre-existing out of facility DNR order (yellow form or pink MOST form) Pink MOST form placed in chart (order not valid for inpatient use);Yellow form placed in chart (order not valid for inpatient use)  ? ? ? ?Chief Complaint  ?Patient presents with  ? Acute Visit  ?  Behaviors  ? ? ?HPI:  ?Pt is a 88 y.o. female seen today for an acute visit for Behaviors ? ?Admitted in the hospital from 03/25-03/31 for Fall and Rhabdomyolysis and dehydration ?  ?Patient has h/o Dementia.Hypertension and Diabetes ? Lives alone in her house. Was found on the floor by her family ?ED had Rhabdomyolysis CPK peaked at 1234 ?UTI got Antibiotics ?T 11 Compression fracture and subacute Pelvic fracture ?CT head negative for acute changes ? ?Now in SNF ?But is having behavior issues. Goes to Other residents rooms Bother  ?Seroquel increased yesterday ?Somewhat more calmer but still getting bouts of agitation ? ?Past Medical History:  ?Diagnosis Date  ? Anxiety   ?  Arthritis   ? Cancer [facility])   ? hx of skin cancer removal on hand   ? Colitis   ? Diabetes mellitus without complication ([place])   ? Gallstones   ? Hyperlipidemia   ? Hypertension   ? IBS (irritable bowel syndrome)   ? Osteoporosis   ? ?Past Surgical History:  ?Procedure Laterality Date  ? APPENDECTOMY    ? BACK SURGERY    ? x2  ? cataract surgery     ? bilateral   ? CHOLECYSTECTOMY  1999  ? EYE SURGERY    ? for glaucoma   ? left eye peeling surgery     ? left femur bone surgery     ? 1978  ? REVERSE SHOULDER ARTHROPLASTY Right 10/10/2014  ? Procedure: RIGHT REVERSE SHOULDER ARTHROPLASTY;  Surgeon: Justice Britain, MD;  Location: [facility];  Service: Orthopedics;  Laterality: Right;  ? ROTATOR CUFF REPAIR    ? TOTAL HIP ARTHROPLASTY Left 02/20/2014  ? Procedure: LEFT TOTAL HIP ARTHROPLASTY ANTERIOR APPROACH;  Surgeon: Gearlean Alf, MD;  Location: [facility];  Service: Orthopedics;  Laterality: Left;  ? WRIST SURGERY    ? left   ? ? ?Allergies  ?Allergen Reactions  ? Robaxin [Methocarbamol] Rash and Other (See Comments)  ?  Red rash on right side of face and forehead cleared after benadryl given, per [name], RN  ? Ancef [Cefazolin] Diarrhea  ? Dilaudid [Hydromorphone] Other (See Comments)  ?  Stomach upset  ? Fosamax [Alendronate] Other (See Comments)  ?  Upset stomach  ? Hyoscyamine Other (See Comments)  ?  "  Caused gas"  ? Morphine And Related Itching  ? Nsaids Diarrhea  ? Parathyroid Hormone (Recomb) Other (See Comments)  ?  was not able to give herself the shot  ? Penicillins Itching  ?  Tolerated Unasyn ?Has patient had a PCN reaction causing immediate rash, facial/tongue/throat swelling, SOB or lightheadedness with hypotension: Yes ?Has patient had a PCN reaction causing severe rash involving mucus membranes or skin necrosis: No ?Has patient had a PCN reaction that required hospitalization: Was in hospital ?Has patient had a PCN reaction occurring within the last 10 years: No ?If all of the above answers are "NO", then may  proceed with Cephalosporin use. ?  ? Tape Other (See Comments)  ?  Bandages and leads cause bruising and weeping at sites  ? Flurbiprofen Diarrhea  ? ? ?Allergies as of 11/17/2021   ? ?   Reactions  ? Robaxin [methocarbamol] Rash, Other (See Comments)  ? Red rash on right side of face and forehead cleared after benadryl given, per [name], RN  ? Ancef [cefazolin] Diarrhea  ? Dilaudid [hydromorphone] Other (See Comments)  ? Stomach upset  ? Fosamax [alendronate] Other (See Comments)  ? Upset stomach  ? Hyoscyamine Other (See Comments)  ? "Caused gas"  ? Morphine And Related Itching  ? Nsaids Diarrhea  ? Parathyroid Hormone (recomb) Other (See Comments)  ? was not able to give herself the shot  ? Penicillins Itching  ? Tolerated Unasyn ?Has patient had a PCN reaction causing immediate rash, facial/tongue/throat swelling, SOB or lightheadedness with hypotension: Yes ?Has patient had a PCN reaction causing severe rash involving mucus membranes or skin necrosis: No ?Has patient had a PCN reaction that required hospitalization: Was in hospital ?Has patient had a PCN reaction occurring within the last 10 years: No ?If all of the above answers are "NO", then may proceed with Cephalosporin use.  ? Tape Other (See Comments)  ? Bandages and leads cause bruising and weeping at sites  ? Flurbiprofen Diarrhea  ? ?  ? ?  ?Medication List  ?  ? ?  ? Accurate as of Nov 17, 2021 11:43 AM. If you have any questions, ask your nurse or doctor.  ?  ?  ? ?  ? ?aspirin EC 81 MG tablet ?Take 81 mg by mouth daily. ?  ?calcium carbonate 1500 (600 Ca) MG Tabs tablet ?Commonly known as: OSCAL ?Take 600 mg of elemental calcium by mouth daily. ?  ?Cholecalciferol 25 MCG (1000 UT) tablet ?Take 1,000 Units by mouth daily. ?  ?denosumab 60 MG/ML Sosy injection ?Commonly known as: PROLIA ?Inject 60 mg into the skin every 6 (six) months. Last injection 06/02/21 ?  ?DSS 100 MG Caps ?Take 100 mg by mouth 2 (two) times daily. ?  ?gabapentin 300 MG  capsule ?Commonly known as: NEURONTIN ?Take 300 mg by mouth. 5x daily as needed pain ?What changed: Another medication with the same name was removed. Continue taking this medication, and follow the directions you see here. ?Changed by: Virgie Dad, MD ?  ?lactose free nutrition Liqd ?Take 237 mLs by mouth 2 (two) times daily between meals. ?  ?LORazepam 0.5 MG tablet ?Commonly known as: ATIVAN ?Take 0.5 mg by mouth every 6 (six) hours as needed for anxiety. ?  ?losartan 50 MG tablet ?Commonly known as: COZAAR ?Take 1 tablet (50 mg total) by mouth daily. ?  ?MELATONIN PO ?Take 3 mg by mouth at bedtime as needed (sleep). ?  ?mirtazapine 15 MG tablet ?Commonly  known as: REMERON ?Take 15 mg by mouth at bedtime. ?  ?oxyCODONE-acetaminophen 10-325 MG tablet ?Commonly known as: PERCOCET ?Take 1 tablet by mouth every 6 (six) hours as needed. ?  ?PreserVision AREDS 2 Caps ?Take 1 capsule by mouth daily. ?  ?QUEtiapine 25 MG tablet ?Commonly known as: SEROQUEL ?Take 25 mg by mouth at bedtime. ?  ?QUEtiapine 25 MG tablet ?Commonly known as: SEROQUEL ?Take 12.5 mg by mouth at bedtime. ?  ? ?  ? ? ?Review of Systems  ?Unable to perform ROS: Dementia  ? ?Immunization History  ?Administered Date(s) Administered  ? Influenza Split 07/04/2009, 04/09/2010, 05/06/2011, 03/29/2012, 05/03/2013  ? Influenza, High Dose Seasonal PF 05/02/2013, 05/25/2017, 04/26/2018, 04/24/2019  ? Influenza, Quadrivalent, Recombinant, Inj, Pf 04/26/2020  ? Influenza,inj,Quad PF,6+ Mos 05/26/2021  ? Influenza,inj,quad, With Preservative 04/18/2017  ? Influenza-Unspecified 05/12/2015  ? PFIZER(Purple Top)SARS-COV-2 Vaccination 08/25/2019, 09/19/2019  ? Pneumococcal Conjugate-13 01/29/2014  ? Pneumococcal Polysaccharide-23 06/07/2007  ? Td 05/02/2003  ? Tdap 10/18/2011  ? Zoster, Live 01/17/2006  ? ?Pertinent  Health Maintenance Due  ?Topic Date Due  ? FOOT EXAM  Never done  ? OPHTHALMOLOGY EXAM  Never done  ? DEXA SCAN  Never done  ? INFLUENZA VACCINE   02/16/2022  ? HEMOGLOBIN A1C  04/13/2022  ? ? ?  10/14/2021  ?  8:15 AM 10/14/2021  ?  7:30 PM 10/15/2021  ?  7:42 AM 10/16/2021  ? 12:00 AM 10/16/2021  ?  9:00 AM  ?Fall Risk  ?Patient Fall Risk Level High fall risk High

## 2021-11-18 LAB — BASIC METABOLIC PANEL
BUN: 34 — AB (ref 4–21)
Creatinine: 0.9 (ref 0.5–1.1)
Glucose: 100
Sodium: 140 (ref 137–147)

## 2021-11-18 LAB — COMPREHENSIVE METABOLIC PANEL: eGFR: 59

## 2021-11-18 LAB — CBC: RBC: 3.8 — AB (ref 3.87–5.11)

## 2021-11-18 LAB — CBC AND DIFFERENTIAL: WBC: 7.1

## 2021-11-20 ENCOUNTER — Other Ambulatory Visit: Payer: Self-pay | Admitting: Orthopedic Surgery

## 2021-11-20 DIAGNOSIS — F419 Anxiety disorder, unspecified: Secondary | ICD-10-CM

## 2021-11-20 MED ORDER — LORAZEPAM 0.5 MG PO TABS
0.5000 mg | ORAL_TABLET | Freq: Two times a day (BID) | ORAL | Status: DC | PRN
Start: 2021-11-20 — End: 2022-02-04

## 2021-11-23 DIAGNOSIS — M4854XD Collapsed vertebra, not elsewhere classified, thoracic region, subsequent encounter for fracture with routine healing: Secondary | ICD-10-CM | POA: Diagnosis not present

## 2021-11-23 DIAGNOSIS — R1310 Dysphagia, unspecified: Secondary | ICD-10-CM | POA: Diagnosis not present

## 2021-11-23 DIAGNOSIS — R29898 Other symptoms and signs involving the musculoskeletal system: Secondary | ICD-10-CM | POA: Diagnosis not present

## 2021-11-23 DIAGNOSIS — Z9181 History of falling: Secondary | ICD-10-CM | POA: Diagnosis not present

## 2021-11-23 DIAGNOSIS — M6281 Muscle weakness (generalized): Secondary | ICD-10-CM | POA: Diagnosis not present

## 2021-11-23 DIAGNOSIS — M4856XD Collapsed vertebra, not elsewhere classified, lumbar region, subsequent encounter for fracture with routine healing: Secondary | ICD-10-CM | POA: Diagnosis not present

## 2021-11-23 DIAGNOSIS — R2681 Unsteadiness on feet: Secondary | ICD-10-CM | POA: Diagnosis not present

## 2021-11-23 DIAGNOSIS — R1312 Dysphagia, oropharyngeal phase: Secondary | ICD-10-CM | POA: Diagnosis not present

## 2021-11-23 DIAGNOSIS — R41841 Cognitive communication deficit: Secondary | ICD-10-CM | POA: Diagnosis not present

## 2021-11-23 DIAGNOSIS — F03C0 Unspecified dementia, severe, without behavioral disturbance, psychotic disturbance, mood disturbance, and anxiety: Secondary | ICD-10-CM | POA: Diagnosis not present

## 2021-11-23 DIAGNOSIS — M6282 Rhabdomyolysis: Secondary | ICD-10-CM | POA: Diagnosis not present

## 2021-11-24 DIAGNOSIS — R1312 Dysphagia, oropharyngeal phase: Secondary | ICD-10-CM | POA: Diagnosis not present

## 2021-11-24 DIAGNOSIS — M6281 Muscle weakness (generalized): Secondary | ICD-10-CM | POA: Diagnosis not present

## 2021-11-24 DIAGNOSIS — R29898 Other symptoms and signs involving the musculoskeletal system: Secondary | ICD-10-CM | POA: Diagnosis not present

## 2021-11-24 DIAGNOSIS — R1310 Dysphagia, unspecified: Secondary | ICD-10-CM | POA: Diagnosis not present

## 2021-11-24 DIAGNOSIS — R41841 Cognitive communication deficit: Secondary | ICD-10-CM | POA: Diagnosis not present

## 2021-11-24 DIAGNOSIS — F03C0 Unspecified dementia, severe, without behavioral disturbance, psychotic disturbance, mood disturbance, and anxiety: Secondary | ICD-10-CM | POA: Diagnosis not present

## 2021-11-25 ENCOUNTER — Other Ambulatory Visit: Payer: Self-pay | Admitting: Orthopedic Surgery

## 2021-11-25 DIAGNOSIS — F03C0 Unspecified dementia, severe, without behavioral disturbance, psychotic disturbance, mood disturbance, and anxiety: Secondary | ICD-10-CM | POA: Diagnosis not present

## 2021-11-25 DIAGNOSIS — R1312 Dysphagia, oropharyngeal phase: Secondary | ICD-10-CM | POA: Diagnosis not present

## 2021-11-25 DIAGNOSIS — R1310 Dysphagia, unspecified: Secondary | ICD-10-CM | POA: Diagnosis not present

## 2021-11-25 DIAGNOSIS — R29898 Other symptoms and signs involving the musculoskeletal system: Secondary | ICD-10-CM | POA: Diagnosis not present

## 2021-11-25 DIAGNOSIS — M159 Polyosteoarthritis, unspecified: Secondary | ICD-10-CM

## 2021-11-25 DIAGNOSIS — R41841 Cognitive communication deficit: Secondary | ICD-10-CM | POA: Diagnosis not present

## 2021-11-25 DIAGNOSIS — M6281 Muscle weakness (generalized): Secondary | ICD-10-CM | POA: Diagnosis not present

## 2021-11-25 MED ORDER — OXYCODONE-ACETAMINOPHEN 5-325 MG PO TABS: 1.0000 | ORAL_TABLET | Freq: Four times a day (QID) | ORAL | 0 refills | Status: DC | PRN

## 2021-11-27 DIAGNOSIS — R41841 Cognitive communication deficit: Secondary | ICD-10-CM | POA: Diagnosis not present

## 2021-11-27 DIAGNOSIS — R29898 Other symptoms and signs involving the musculoskeletal system: Secondary | ICD-10-CM | POA: Diagnosis not present

## 2021-11-27 DIAGNOSIS — R1312 Dysphagia, oropharyngeal phase: Secondary | ICD-10-CM | POA: Diagnosis not present

## 2021-11-27 DIAGNOSIS — M6281 Muscle weakness (generalized): Secondary | ICD-10-CM | POA: Diagnosis not present

## 2021-11-27 DIAGNOSIS — F03C0 Unspecified dementia, severe, without behavioral disturbance, psychotic disturbance, mood disturbance, and anxiety: Secondary | ICD-10-CM | POA: Diagnosis not present

## 2021-11-27 DIAGNOSIS — R1310 Dysphagia, unspecified: Secondary | ICD-10-CM | POA: Diagnosis not present

## 2021-11-30 DIAGNOSIS — R29898 Other symptoms and signs involving the musculoskeletal system: Secondary | ICD-10-CM | POA: Diagnosis not present

## 2021-11-30 DIAGNOSIS — M6281 Muscle weakness (generalized): Secondary | ICD-10-CM | POA: Diagnosis not present

## 2021-11-30 DIAGNOSIS — R1310 Dysphagia, unspecified: Secondary | ICD-10-CM | POA: Diagnosis not present

## 2021-11-30 DIAGNOSIS — F03C0 Unspecified dementia, severe, without behavioral disturbance, psychotic disturbance, mood disturbance, and anxiety: Secondary | ICD-10-CM | POA: Diagnosis not present

## 2021-11-30 DIAGNOSIS — R1312 Dysphagia, oropharyngeal phase: Secondary | ICD-10-CM | POA: Diagnosis not present

## 2021-11-30 DIAGNOSIS — R41841 Cognitive communication deficit: Secondary | ICD-10-CM | POA: Diagnosis not present

## 2021-12-01 DIAGNOSIS — R1310 Dysphagia, unspecified: Secondary | ICD-10-CM | POA: Diagnosis not present

## 2021-12-01 DIAGNOSIS — M6281 Muscle weakness (generalized): Secondary | ICD-10-CM | POA: Diagnosis not present

## 2021-12-01 DIAGNOSIS — F03C0 Unspecified dementia, severe, without behavioral disturbance, psychotic disturbance, mood disturbance, and anxiety: Secondary | ICD-10-CM | POA: Diagnosis not present

## 2021-12-01 DIAGNOSIS — R41841 Cognitive communication deficit: Secondary | ICD-10-CM | POA: Diagnosis not present

## 2021-12-01 DIAGNOSIS — R1312 Dysphagia, oropharyngeal phase: Secondary | ICD-10-CM | POA: Diagnosis not present

## 2021-12-01 DIAGNOSIS — R29898 Other symptoms and signs involving the musculoskeletal system: Secondary | ICD-10-CM | POA: Diagnosis not present

## 2021-12-02 DIAGNOSIS — R29898 Other symptoms and signs involving the musculoskeletal system: Secondary | ICD-10-CM | POA: Diagnosis not present

## 2021-12-02 DIAGNOSIS — M6281 Muscle weakness (generalized): Secondary | ICD-10-CM | POA: Diagnosis not present

## 2021-12-02 DIAGNOSIS — R1312 Dysphagia, oropharyngeal phase: Secondary | ICD-10-CM | POA: Diagnosis not present

## 2021-12-02 DIAGNOSIS — F03C0 Unspecified dementia, severe, without behavioral disturbance, psychotic disturbance, mood disturbance, and anxiety: Secondary | ICD-10-CM | POA: Diagnosis not present

## 2021-12-02 DIAGNOSIS — R41841 Cognitive communication deficit: Secondary | ICD-10-CM | POA: Diagnosis not present

## 2021-12-02 DIAGNOSIS — R1310 Dysphagia, unspecified: Secondary | ICD-10-CM | POA: Diagnosis not present

## 2021-12-03 ENCOUNTER — Encounter (HOSPITAL_COMMUNITY): Payer: Medicare Other

## 2021-12-03 DIAGNOSIS — R41841 Cognitive communication deficit: Secondary | ICD-10-CM | POA: Diagnosis not present

## 2021-12-03 DIAGNOSIS — F03C0 Unspecified dementia, severe, without behavioral disturbance, psychotic disturbance, mood disturbance, and anxiety: Secondary | ICD-10-CM | POA: Diagnosis not present

## 2021-12-03 DIAGNOSIS — R29898 Other symptoms and signs involving the musculoskeletal system: Secondary | ICD-10-CM | POA: Diagnosis not present

## 2021-12-03 DIAGNOSIS — R1310 Dysphagia, unspecified: Secondary | ICD-10-CM | POA: Diagnosis not present

## 2021-12-03 DIAGNOSIS — M6281 Muscle weakness (generalized): Secondary | ICD-10-CM | POA: Diagnosis not present

## 2021-12-03 DIAGNOSIS — R1312 Dysphagia, oropharyngeal phase: Secondary | ICD-10-CM | POA: Diagnosis not present

## 2021-12-04 DIAGNOSIS — M6281 Muscle weakness (generalized): Secondary | ICD-10-CM | POA: Diagnosis not present

## 2021-12-04 DIAGNOSIS — R29898 Other symptoms and signs involving the musculoskeletal system: Secondary | ICD-10-CM | POA: Diagnosis not present

## 2021-12-04 DIAGNOSIS — R1310 Dysphagia, unspecified: Secondary | ICD-10-CM | POA: Diagnosis not present

## 2021-12-04 DIAGNOSIS — R1312 Dysphagia, oropharyngeal phase: Secondary | ICD-10-CM | POA: Diagnosis not present

## 2021-12-04 DIAGNOSIS — R41841 Cognitive communication deficit: Secondary | ICD-10-CM | POA: Diagnosis not present

## 2021-12-04 DIAGNOSIS — F03C0 Unspecified dementia, severe, without behavioral disturbance, psychotic disturbance, mood disturbance, and anxiety: Secondary | ICD-10-CM | POA: Diagnosis not present

## 2021-12-07 DIAGNOSIS — R1310 Dysphagia, unspecified: Secondary | ICD-10-CM | POA: Diagnosis not present

## 2021-12-07 DIAGNOSIS — R29898 Other symptoms and signs involving the musculoskeletal system: Secondary | ICD-10-CM | POA: Diagnosis not present

## 2021-12-07 DIAGNOSIS — F03C0 Unspecified dementia, severe, without behavioral disturbance, psychotic disturbance, mood disturbance, and anxiety: Secondary | ICD-10-CM | POA: Diagnosis not present

## 2021-12-07 DIAGNOSIS — R41841 Cognitive communication deficit: Secondary | ICD-10-CM | POA: Diagnosis not present

## 2021-12-07 DIAGNOSIS — M6281 Muscle weakness (generalized): Secondary | ICD-10-CM | POA: Diagnosis not present

## 2021-12-07 DIAGNOSIS — R1312 Dysphagia, oropharyngeal phase: Secondary | ICD-10-CM | POA: Diagnosis not present

## 2021-12-08 DIAGNOSIS — R29898 Other symptoms and signs involving the musculoskeletal system: Secondary | ICD-10-CM | POA: Diagnosis not present

## 2021-12-08 DIAGNOSIS — R1310 Dysphagia, unspecified: Secondary | ICD-10-CM | POA: Diagnosis not present

## 2021-12-08 DIAGNOSIS — R1312 Dysphagia, oropharyngeal phase: Secondary | ICD-10-CM | POA: Diagnosis not present

## 2021-12-08 DIAGNOSIS — R41841 Cognitive communication deficit: Secondary | ICD-10-CM | POA: Diagnosis not present

## 2021-12-08 DIAGNOSIS — F03C0 Unspecified dementia, severe, without behavioral disturbance, psychotic disturbance, mood disturbance, and anxiety: Secondary | ICD-10-CM | POA: Diagnosis not present

## 2021-12-08 DIAGNOSIS — M6281 Muscle weakness (generalized): Secondary | ICD-10-CM | POA: Diagnosis not present

## 2021-12-09 DIAGNOSIS — R1312 Dysphagia, oropharyngeal phase: Secondary | ICD-10-CM | POA: Diagnosis not present

## 2021-12-09 DIAGNOSIS — R41841 Cognitive communication deficit: Secondary | ICD-10-CM | POA: Diagnosis not present

## 2021-12-09 DIAGNOSIS — R1310 Dysphagia, unspecified: Secondary | ICD-10-CM | POA: Diagnosis not present

## 2021-12-09 DIAGNOSIS — M6281 Muscle weakness (generalized): Secondary | ICD-10-CM | POA: Diagnosis not present

## 2021-12-09 DIAGNOSIS — F03C0 Unspecified dementia, severe, without behavioral disturbance, psychotic disturbance, mood disturbance, and anxiety: Secondary | ICD-10-CM | POA: Diagnosis not present

## 2021-12-09 DIAGNOSIS — R29898 Other symptoms and signs involving the musculoskeletal system: Secondary | ICD-10-CM | POA: Diagnosis not present

## 2021-12-10 ENCOUNTER — Emergency Department (HOSPITAL_COMMUNITY): Payer: Medicare Other

## 2021-12-10 ENCOUNTER — Other Ambulatory Visit: Payer: Self-pay

## 2021-12-10 ENCOUNTER — Emergency Department (HOSPITAL_COMMUNITY)
Admission: EM | Admit: 2021-12-10 | Discharge: 2021-12-11 | Disposition: A | Discharge status: Skilled Nursing Facility | Payer: Medicare Other | Attending: Emergency Medicine | Admitting: Emergency Medicine

## 2021-12-10 ENCOUNTER — Encounter: Payer: Self-pay | Admitting: Orthopedic Surgery

## 2021-12-10 ENCOUNTER — Non-Acute Institutional Stay (SKILLED_NURSING_FACILITY): Payer: Medicare Other | Admitting: Orthopedic Surgery

## 2021-12-10 ENCOUNTER — Encounter (HOSPITAL_COMMUNITY): Payer: Self-pay

## 2021-12-10 DIAGNOSIS — F03C11 Unspecified dementia, severe, with agitation: Secondary | ICD-10-CM | POA: Diagnosis not present

## 2021-12-10 DIAGNOSIS — Z7982 Long term (current) use of aspirin: Secondary | ICD-10-CM | POA: Insufficient documentation

## 2021-12-10 DIAGNOSIS — D72829 Elevated white blood cell count, unspecified: Secondary | ICD-10-CM | POA: Insufficient documentation

## 2021-12-10 DIAGNOSIS — F419 Anxiety disorder, unspecified: Secondary | ICD-10-CM | POA: Insufficient documentation

## 2021-12-10 DIAGNOSIS — I10 Essential (primary) hypertension: Secondary | ICD-10-CM | POA: Diagnosis not present

## 2021-12-10 DIAGNOSIS — Z79899 Other long term (current) drug therapy: Secondary | ICD-10-CM | POA: Insufficient documentation

## 2021-12-10 DIAGNOSIS — M8008XA Age-related osteoporosis with current pathological fracture, vertebra(e), initial encounter for fracture: Secondary | ICD-10-CM | POA: Diagnosis not present

## 2021-12-10 DIAGNOSIS — R29898 Other symptoms and signs involving the musculoskeletal system: Secondary | ICD-10-CM | POA: Diagnosis not present

## 2021-12-10 DIAGNOSIS — R531 Weakness: Secondary | ICD-10-CM | POA: Diagnosis not present

## 2021-12-10 DIAGNOSIS — R58 Hemorrhage, not elsewhere classified: Secondary | ICD-10-CM | POA: Diagnosis not present

## 2021-12-10 DIAGNOSIS — E785 Hyperlipidemia, unspecified: Secondary | ICD-10-CM | POA: Diagnosis not present

## 2021-12-10 DIAGNOSIS — R41841 Cognitive communication deficit: Secondary | ICD-10-CM | POA: Diagnosis not present

## 2021-12-10 DIAGNOSIS — R410 Disorientation, unspecified: Secondary | ICD-10-CM | POA: Insufficient documentation

## 2021-12-10 DIAGNOSIS — I119 Hypertensive heart disease without heart failure: Secondary | ICD-10-CM | POA: Diagnosis not present

## 2021-12-10 DIAGNOSIS — F0390 Unspecified dementia without behavioral disturbance: Secondary | ICD-10-CM | POA: Diagnosis not present

## 2021-12-10 DIAGNOSIS — J9809 Other diseases of bronchus, not elsewhere classified: Secondary | ICD-10-CM | POA: Diagnosis not present

## 2021-12-10 DIAGNOSIS — R4182 Altered mental status, unspecified: Secondary | ICD-10-CM | POA: Diagnosis not present

## 2021-12-10 DIAGNOSIS — R1310 Dysphagia, unspecified: Secondary | ICD-10-CM | POA: Diagnosis not present

## 2021-12-10 DIAGNOSIS — R5383 Other fatigue: Secondary | ICD-10-CM

## 2021-12-10 DIAGNOSIS — Z96611 Presence of right artificial shoulder joint: Secondary | ICD-10-CM | POA: Insufficient documentation

## 2021-12-10 DIAGNOSIS — Z85828 Personal history of other malignant neoplasm of skin: Secondary | ICD-10-CM | POA: Diagnosis not present

## 2021-12-10 DIAGNOSIS — E119 Type 2 diabetes mellitus without complications: Secondary | ICD-10-CM | POA: Insufficient documentation

## 2021-12-10 DIAGNOSIS — R918 Other nonspecific abnormal finding of lung field: Secondary | ICD-10-CM | POA: Diagnosis not present

## 2021-12-10 DIAGNOSIS — J90 Pleural effusion, not elsewhere classified: Secondary | ICD-10-CM | POA: Insufficient documentation

## 2021-12-10 DIAGNOSIS — Z20822 Contact with and (suspected) exposure to covid-19: Secondary | ICD-10-CM | POA: Insufficient documentation

## 2021-12-10 DIAGNOSIS — J811 Chronic pulmonary edema: Secondary | ICD-10-CM | POA: Diagnosis not present

## 2021-12-10 DIAGNOSIS — R1312 Dysphagia, oropharyngeal phase: Secondary | ICD-10-CM | POA: Diagnosis not present

## 2021-12-10 DIAGNOSIS — F03C0 Unspecified dementia, severe, without behavioral disturbance, psychotic disturbance, mood disturbance, and anxiety: Secondary | ICD-10-CM | POA: Diagnosis not present

## 2021-12-10 DIAGNOSIS — I959 Hypotension, unspecified: Secondary | ICD-10-CM | POA: Diagnosis not present

## 2021-12-10 DIAGNOSIS — R0689 Other abnormalities of breathing: Secondary | ICD-10-CM | POA: Diagnosis not present

## 2021-12-10 DIAGNOSIS — M6281 Muscle weakness (generalized): Secondary | ICD-10-CM | POA: Diagnosis not present

## 2021-12-10 LAB — CBC WITH DIFFERENTIAL/PLATELET
Abs Immature Granulocytes: 0.02 10*3/uL (ref 0.00–0.07)
Basophils Absolute: 0.1 10*3/uL (ref 0.0–0.1)
Basophils Relative: 1 %
Eosinophils Absolute: 0.2 10*3/uL (ref 0.0–0.5)
Eosinophils Relative: 2 %
HCT: 32.2 % — ABNORMAL LOW (ref 36.0–46.0)
Hemoglobin: 10.4 g/dL — ABNORMAL LOW (ref 12.0–15.0)
Immature Granulocytes: 0 %
Lymphocytes Relative: 20 %
Lymphs Abs: 2.2 10*3/uL (ref 0.7–4.0)
MCH: 31.7 pg (ref 26.0–34.0)
MCHC: 32.3 g/dL (ref 30.0–36.0)
MCV: 98.2 fL (ref 80.0–100.0)
Monocytes Absolute: 1.3 10*3/uL — ABNORMAL HIGH (ref 0.1–1.0)
Monocytes Relative: 12 %
Neutro Abs: 7.3 10*3/uL (ref 1.7–7.7)
Neutrophils Relative %: 65 %
Platelets: 363 10*3/uL (ref 150–400)
RBC: 3.28 MIL/uL — ABNORMAL LOW (ref 3.87–5.11)
RDW: 12.8 % (ref 11.5–15.5)
WBC: 11.1 10*3/uL — ABNORMAL HIGH (ref 4.0–10.5)
nRBC: 0 % (ref 0.0–0.2)

## 2021-12-10 LAB — COMPREHENSIVE METABOLIC PANEL
ALT: 11 U/L (ref 0–44)
AST: 15 U/L (ref 15–41)
Albumin: 2.9 g/dL — ABNORMAL LOW (ref 3.5–5.0)
Alkaline Phosphatase: 42 U/L (ref 38–126)
Anion gap: 10 (ref 5–15)
BUN: 38 mg/dL — ABNORMAL HIGH (ref 8–23)
CO2: 27 mmol/L (ref 22–32)
Calcium: 9.4 mg/dL (ref 8.9–10.3)
Chloride: 100 mmol/L (ref 98–111)
Creatinine, Ser: 0.95 mg/dL (ref 0.44–1.00)
GFR, Estimated: 56 mL/min — ABNORMAL LOW (ref >60)
Glucose, Bld: 99 mg/dL (ref 70–99)
Potassium: 5.3 mmol/L — ABNORMAL HIGH (ref 3.5–5.1)
Sodium: 137 mmol/L (ref 135–145)
Total Bilirubin: 0.4 mg/dL (ref 0.3–1.2)
Total Protein: 6.8 g/dL (ref 6.5–8.1)

## 2021-12-10 LAB — URINALYSIS, ROUTINE W REFLEX MICROSCOPIC
Bilirubin Urine: NEGATIVE
Glucose, UA: NEGATIVE mg/dL
Hgb urine dipstick: NEGATIVE
Ketones, ur: NEGATIVE mg/dL
Leukocytes,Ua: NEGATIVE
Nitrite: NEGATIVE
Protein, ur: NEGATIVE mg/dL
Specific Gravity, Urine: 1.016 (ref 1.005–1.030)
pH: 8 (ref 5.0–8.0)

## 2021-12-10 LAB — BASIC METABOLIC PANEL
Anion gap: 6 (ref 5–15)
BUN: 35 mg/dL — ABNORMAL HIGH (ref 8–23)
CO2: 27 mmol/L (ref 22–32)
Calcium: 9 mg/dL (ref 8.9–10.3)
Chloride: 101 mmol/L (ref 98–111)
Creatinine, Ser: 0.91 mg/dL (ref 0.44–1.00)
GFR, Estimated: 58 mL/min — ABNORMAL LOW (ref >60)
Glucose, Bld: 112 mg/dL — ABNORMAL HIGH (ref 70–99)
Potassium: 5.1 mmol/L (ref 3.5–5.1)
Sodium: 134 mmol/L — ABNORMAL LOW (ref 135–145)

## 2021-12-10 LAB — VALPROIC ACID LEVEL: Valproic Acid Lvl: 13 ug/mL — ABNORMAL LOW (ref 50.0–100.0)

## 2021-12-10 LAB — APTT: aPTT: 32 seconds (ref 24–36)

## 2021-12-10 LAB — RESP PANEL BY RT-PCR (FLU A&B, COVID) ARPGX2
Influenza A by PCR: NEGATIVE
Influenza B by PCR: NEGATIVE
SARS Coronavirus 2 by RT PCR: NEGATIVE

## 2021-12-10 LAB — PROTIME-INR
INR: 1.1 (ref 0.8–1.2)
Prothrombin Time: 13.8 seconds (ref 11.4–15.2)

## 2021-12-10 LAB — LACTIC ACID, PLASMA: Lactic Acid, Venous: 1.4 mmol/L (ref 0.5–1.9)

## 2021-12-10 MED ORDER — LACTATED RINGERS IV SOLN: INTRAVENOUS | Status: DC

## 2021-12-10 MED ORDER — LORAZEPAM 2 MG/ML IJ SOLN
0.5000 mg | Freq: Once | INTRAMUSCULAR | Status: AC
Start: 2021-12-10 — End: 2021-12-10
  Administered 2021-12-10: 0.5 mg via INTRAMUSCULAR
  Filled 2021-12-10: qty 1

## 2021-12-10 MED ORDER — IOHEXOL 300 MG/ML  SOLN
75.0000 mL | Freq: Once | INTRAMUSCULAR | Status: AC | PRN
  Administered 2021-12-10: 75 mL via INTRAVENOUS

## 2021-12-10 NOTE — ED Provider Notes (Signed)
[facility] DEPT Provider Note   CSN: [id number] Arrival date & time: 12/10/21  1549     History  Chief Complaint  Patient presents with   Altered Mental Status    Marissa Ryan is a 88 y.o. female.  Pt is a 88 yo female with a pmhx significant for osteoporosis, htn, arthritis, hyperlipidemia, dm, skin cancer, HOH, dementia and anxiety.  Pt is from [facility] and the staff report that pt has been more altered than nl.  Pt is unable to give me any hx.  She only complains of needing to urinate.  EMS reports RA O2 sat 90%, so they put her on 2L oxygen.  She is not on oxygen at the facility.      Home Medications Prior to Admission medications   Medication Sig Start Date End Date Taking? Authorizing Provider  aspirin EC 81 MG tablet Take 81 mg by mouth daily.    [provider]  calcium carbonate (OSCAL) 1500 (600 Ca) MG TABS tablet Take 600 mg of elemental calcium by mouth daily.    [provider]  Cholecalciferol 25 MCG (1000 UT) tablet Take 1,000 Units by mouth daily.    [provider]  denosumab (PROLIA) 60 MG/ML SOSY injection Inject 60 mg into the skin every 6 (six) months. Last injection 06/02/21    [provider]  divalproex (DEPAKOTE SPRINKLE) 125 MG capsule Take 125 mg by mouth 2 (two) times daily.    [provider]  docusate sodium 100 MG CAPS Take 100 mg by mouth 2 (two) times daily. 02/25/14   Perkins, Alexzandrew L, PA-C  gabapentin (NEURONTIN) 300 MG capsule Take 300 mg by mouth 3 (three) times daily.    [provider]  lactose free nutrition (BOOST) LIQD Take 237 mLs by mouth 2 (two) times daily between meals.    [provider]  LORazepam (ATIVAN) 0.5 MG tablet Take 1 tablet (0.5 mg total) by mouth 2 (two) times daily as needed for anxiety. 11/20/21   Fargo, Amy E, NP  losartan (COZAAR) 50 MG tablet Take 1 tablet (50 mg total) by mouth daily. 10/17/21   Shawna Clamp, MD   MELATONIN PO Take 3 mg by mouth at bedtime as needed (sleep).    [provider]  mirtazapine (REMERON) 15 MG tablet Take 15 mg by mouth at bedtime.    [provider]  Multiple Vitamins-Minerals (PRESERVISION AREDS 2) CAPS Take 1 capsule by mouth daily.    [provider]  oxyCODONE-acetaminophen (PERCOCET/ROXICET) 5-325 MG tablet Take 1 tablet by mouth every 6 (six) hours as needed for severe pain. 11/25/21   Fargo, Amy E, NP  QUEtiapine (SEROQUEL) 25 MG tablet Take 25 mg by mouth at bedtime.    [provider]  QUEtiapine (SEROQUEL) 25 MG tablet Take 12.5 mg by mouth at bedtime.    [provider]      Allergies    Robaxin [methocarbamol], Ancef [cefazolin], Dilaudid [hydromorphone], Fosamax [alendronate], Hyoscyamine, Morphine and related, Nsaids, Parathyroid hormone (recomb), Penicillins, Tape, and Flurbiprofen    Review of Systems   Review of Systems  Unable to perform ROS: Dementia  All other systems reviewed and are negative.  Physical Exam Updated Vital Signs BP (!) 161/73   Pulse 78   Temp 98.6 F (37 C) (Oral)   Resp 17   SpO2 94%  Physical Exam Vitals and nursing note reviewed.  Constitutional:      Appearance: Normal appearance.  HENT:     Head: Normocephalic and atraumatic.     Right Ear: External ear normal.     Left Ear: External ear normal.     Nose: Nose normal.     Mouth/Throat:     Mouth: Mucous membranes are moist.     Pharynx: Oropharynx is clear.  Eyes:     Extraocular Movements: Extraocular movements intact.     Comments: Large cataract right eye  Cardiovascular:     Rate and Rhythm: Normal rate and regular rhythm.     Pulses: Normal pulses.     Heart sounds: Normal heart sounds.  Pulmonary:     Effort: Pulmonary effort is normal.     Breath sounds: Normal breath sounds.  Abdominal:     General: Abdomen is flat. Bowel sounds are normal.     Palpations: Abdomen is soft.  Musculoskeletal:         General: Normal range of motion.     Cervical back: Normal range of motion and neck supple.  Skin:    General: Skin is warm.     Capillary Refill: Capillary refill takes less than 2 seconds.  Neurological:     General: No focal deficit present.     Mental Status: She is alert. She is disoriented.  Psychiatric:        Mood and Affect: Mood normal.        Behavior: Behavior normal.    ED Results / Procedures / Treatments   Labs (all labs ordered are listed, but only abnormal results are displayed) Labs Reviewed  COMPREHENSIVE METABOLIC PANEL - Abnormal; Notable for the following components:      Result Value   Potassium 5.3 (*)    BUN 38 (*)    Albumin 2.9 (*)    GFR, Estimated 56 (*)    All other components within normal limits  CBC WITH DIFFERENTIAL/PLATELET - Abnormal; Notable for the following components:   WBC 11.1 (*)    RBC 3.28 (*)    Hemoglobin 10.4 (*)    HCT 32.2 (*)    Monocytes Absolute 1.3 (*)    All other components within normal limits  VALPROIC ACID LEVEL - Abnormal; Notable for the following components:   Valproic Acid Lvl 13 (*)    All other components within normal limits  BASIC METABOLIC PANEL - Abnormal; Notable for the following components:   Sodium 134 (*)    Glucose, Bld 112 (*)    BUN 35 (*)    GFR, Estimated 58 (*)    All other components within normal limits  RESP PANEL BY RT-PCR (FLU A&B, COVID) ARPGX2  CULTURE, BLOOD (ROUTINE X 2)  CULTURE, BLOOD (ROUTINE X 2)  URINE CULTURE  LACTIC ACID, PLASMA  PROTIME-INR  APTT  URINALYSIS, ROUTINE W REFLEX MICROSCOPIC  LACTIC ACID, PLASMA    EKG EKG Interpretation  Date/Time:  Thursday Dec 10 2021 16:55:36 EDT Ventricular Rate:  83 PR Interval:  168 QRS Duration: 80 QT Interval:  364 QTC Calculation: 428 R Axis:   -32 Text Interpretation: Sinus rhythm Left axis deviation Abnormal R-wave progression, late transition No significant change since last tracing Confirmed by Isla Pence [phone])  on 12/10/2021 5:16:00 PM  Radiology CT Head Wo Contrast  Result Date: 12/10/2021 CLINICAL DATA:  Mental status change, unknown cause EXAM: CT HEAD WITHOUT CONTRAST TECHNIQUE: Contiguous axial images were obtained from the base of the skull through the vertex without intravenous contrast. RADIATION DOSE REDUCTION: This exam was performed according  to the departmental dose-optimization program which includes automated exposure control, adjustment of the mA and/or kV according to patient size and/or use of iterative reconstruction technique. COMPARISON:  CT head October 10, 2021 FINDINGS: Brain: No evidence of acute infarction, hemorrhage, hydrocephalus, extra-axial collection or mass lesion/mass effect. Patchy white matter hypoattenuation, nonspecific but compatible with chronic microvascular disease. Cerebral atrophy. Vascular: Calcific intracranial atherosclerosis. Skull: No acute fracture.  Hyperostosis frontalis. Sinuses/Orbits: Clear sinuses.  No acute orbital findings. Other: No mastoid effusions. IMPRESSION: 1. No evidence of acute intracranial abnormality. 2. Chronic microvascular ischemic disease and cerebral atrophy (ICD10-G31.9). Electronically Signed   By: Margaretha Sheffield M.D.   On: 12/10/2021 16:50   CT Chest W Contrast  Result Date: 12/10/2021 CLINICAL DATA:  Respiratory illness, nondiagnostic xray EXAM: CT CHEST WITH CONTRAST TECHNIQUE: Multidetector CT imaging of the chest was performed during intravenous contrast administration. RADIATION DOSE REDUCTION: This exam was performed according to the departmental dose-optimization program which includes automated exposure control, adjustment of the mA and/or kV according to patient size and/or use of iterative reconstruction technique. CONTRAST:  10m OMNIPAQUE IOHEXOL 300 MG/ML  SOLN COMPARISON:  Same day radiograph, Two-view chest radiograph Nov 26, 2017, CT abdomen pelvis 06/20/2018. FINDINGS: Cardiovascular: Mild cardiomegaly.Coronary artery,  mitral annular, and aortic valve calcifications.No pericardial disease. No central PE. Moderate atherosclerosis of the aortic arch and descending thoracic aorta.Delete Mediastinum/Nodes: No lymphadenopathy.There is a 2.1 cm right thyroid cyst, no follow-up recommended.Small hiatal hernia.The trachea is unremarkable. Lungs/Pleura: Airways are patent. There is mild bronchial wall thickening. There is mild interlobular septal thickening and ground-glass opacities bilaterally. There is no focal airspace consolidation. Trace right pleural effusion.No suspicious pulmonary nodules. No pneumothorax. Upper Abdomen: Prior cholecystectomy.  No acute abnormality. Musculoskeletal: There are multiple compression deformities in the thoracic spine, which are chronic, though it note there is a progressive compression deformity of T11 in comparison to prior CT in December 2019. Other thoracic compression deformities and L1 compression deformity appear similar to prior two-view chest radiograph in May 2019. There is a chronic manubrium fracture deformity in of the adjacent right medial clavicle. Reverse right shoulder arthroplasty. Severe left glenohumeral osteoarthritis. Chronic right upper rib injuries. Chronic left mid rib injury. IMPRESSION: Mild pulmonary edema with trace right pleural effusion. Mild cardiomegaly. Multiple chronic compression deformities of the thoracic spine, with progressive height loss at T11. Electronically Signed   By: JMaurine SimmeringM.D.   On: 12/10/2021 18:35   DG Chest Port 1 View  Result Date: 12/10/2021 CLINICAL DATA:  Provided history: Questionable sepsis-evaluate for abnormality. EXAM: PORTABLE CHEST 1 VIEW COMPARISON:  Same day chest radiographs 12/10/2021. Additional prior chest radiographs 11/26/2017 and earlier. FINDINGS: The patient is significantly rotated to the right, limiting overall evaluation and limiting evaluation of heart size. Ill-defined opacities within the perihilar right lung and  left lung base. No evidence of pleural effusion or pneumothorax. No acute bony abnormality identified. Prior reverse right shoulder arthroplasty, incompletely imaged. IMPRESSION: Significant patient rotation to the right, limiting evaluation. Ill-defined opacities within the perihilar right lung and left lung base. Findings may reflect atelectasis. However, pneumonia/aspiration cannot be excluded. Electronically Signed   By: KKellie SimmeringD.O.   On: 12/10/2021 16:51    Procedures Procedures    Medications Ordered in ED Medications  lactated ringers infusion (0 mLs Intravenous Stopped 12/10/21 2145)  iohexol (OMNIPAQUE) 300 MG/ML solution 75 mL (75 mLs Intravenous Contrast Given 12/10/21 1800)    ED Course/ Medical Decision Making/ A&P  Medical Decision Making Amount and/or Complexity of Data Reviewed Labs: ordered. Radiology: ordered. ECG/medicine tests: ordered.  Risk Prescription drug management.   This patient presents to the ED for concern of AMS, this involves an extensive number of treatment options, and is a complaint that carries with it a high risk of complications and morbidity.  The differential diagnosis includes electrolyte abn, infection, cva   Co morbidities that complicate the patient evaluation  osteoporosis, htn, arthritis, hyperlipidemia, dm, skin cancer, HOH, dementia and anxiety   Additional history obtained:  Additional history obtained from epic chart review External records from outside source obtained and reviewed including EMS report   Lab Tests:  I Ordered, and personally interpreted labs.  The pertinent results include:  cbc with wbc 11; hgb 10.4; cmp with K 5.3 (bmp will be repeated); lactic nl at 1.4; ua neg; covid/flu neg;  repeat bmp with nl K   Imaging Studies ordered:  I ordered imaging studies including cxr and ct head  I independently visualized and interpreted imaging which showed  CXR:   IMPRESSION:   Significant patient rotation to the right, limiting evaluation.     Ill-defined opacities within the perihilar right lung and left lung  base. Findings may reflect atelectasis. However,  pneumonia/aspiration cannot be excluded.  CT head:   IMPRESSION:  1. No evidence of acute intracranial abnormality.  2. Chronic microvascular ischemic disease and cerebral atrophy  (ICD10-G31.9).  CT chest:   IMPRESSION:  Mild pulmonary edema with trace right pleural effusion. Mild  cardiomegaly.     Multiple chronic compression deformities of the thoracic spine, with  progressive height loss at T11.   I agree with the radiologist interpretation   Cardiac Monitoring:  The patient was maintained on a cardiac monitor.  I personally viewed and interpreted the cardiac monitored which showed an underlying rhythm of: nsr   Medicines ordered and prescription drug management:  I ordered medication including ivfs  for dehydration  Reevaluation of the patient after these medicines showed that the patient improved I have reviewed the patients home medicines and have made adjustments as needed   Test Considered:  Ct abd/pelvis, but no pain or fever   Critical Interventions:  IVFs   Problem List / ED Course:  AMS:  resolved.  Pt is awake and is very active in the bed.  She is able to ambulate with assistance.  There was a concern for infection, but pt is afebrile and labs and ct do not show evidence of infection.   Reevaluation:  After the interventions noted above, I reevaluated the patient and found that they have :improved   Social Determinants of Health:  snf   Dispostion:  After consideration of the diagnostic results and the patients response to treatment, I feel that the patent would benefit from discharge with outpatient f/u.    I tried to call the legal guardian several times, but the number kept giving me a busy signal.        Final Clinical Impression(s) / ED  Diagnoses Final diagnoses:  Altered mental status, unspecified altered mental status type  Severe dementia with agitation, unspecified dementia type [facility])    Rx / DC Orders ED Discharge Orders     None         Isla Pence, MD 12/10/21 2149

## 2021-12-10 NOTE — ED Notes (Signed)
Pt was ambulated by EMT-P and Tech, pt required staff assistance and redirection to bed. Pt maintained steady gait, but required staff to provide assistance with standing.

## 2021-12-10 NOTE — ED Notes (Signed)
[facility] was contacted. Unable to speak with a live person. Message left on VM

## 2021-12-10 NOTE — ED Triage Notes (Signed)
EMS reports from [facility], called out for AMS, difficult to rouse.   BP 146/96 HR 80 RR 28 Sp02 90 RA Temp 99.9 CBG 112  823m LR enroute. (Pt pulled out IV)

## 2021-12-10 NOTE — ED Notes (Signed)
Pt repeatedly removing vital sign monitoring devices, ekg, etc.

## 2021-12-10 NOTE — ED Notes (Signed)
Pt was found in room sitting on chair at bedside, pt ambulated self. Staff assisted pt back to bed, repositioned to comfort.

## 2021-12-10 NOTE — ED Notes (Signed)
[facility] was contacted for trx

## 2021-12-10 NOTE — Progress Notes (Signed)
Location:  [facility] Room Number: NO35/A Place of Service:  SNF [phone]) Provider:  Yvonna Alanis, NP   Crist Infante, MD  Patient Care Team: Crist Infante, MD as PCP - General (Internal Medicine)  Extended Emergency Contact Information Primary Emergency Contact: Savona Mobile Phone: [phone] Relation: Grandson Interpreter needed? No Secondary Emergency Contact: Sar,(DIL)[name] Home Phone: [phone] Mobile Phone: [phone] Relation: Relative  Code Status:  DNR Goals of care: Advanced Directive information    12/10/2021    4:01 PM  Advanced Directives  Does Patient Have a Medical Advance Directive? No  Type of Paramedic of [place];Out of facility DNR (pink MOST or yellow form)  Does patient want to make changes to medical advance directive? No - Patient declined  Copy of [place] in Chart? Yes - validated most recent copy scanned in chart (See row information)  Would patient like information on creating a medical advance directive? No - Patient declined  Pre-existing out of facility DNR order (yellow form or pink MOST form) Yellow form placed in chart (order not valid for inpatient use)     Chief Complaint  Patient presents with   Acute Visit    Patient is being seen for abdominal pain    HPI:  Pt is a 88 y.o. female seen today for acute visit due to lethargy.   She currently resides on the skilled nursing unit at [facility]. PMH: afib, HTN, dysphagia, IBS, diabetes, dementia, OA, and anxiety.   Nursing reports increased drowsiness throughout the day. Vitals: temp 99.8, bp 142/60, HR 90, RR 22, O2 sat 98 % on room air. Blood sugar 133 this morning. She did eat > 50% of breakfast and lunch. She was easily aroused with sternal chest rub. Unable to answer questions, just moaning and fell back asleep. I asked her to open her eyes and she did for a brief moment.    Past Medical  History:  Diagnosis Date   Anxiety    Arthritis    Cancer ([place])    hx of skin cancer removal on hand    Colitis    Diabetes mellitus without complication ([place])    Gallstones    Hyperlipidemia    Hypertension    IBS (irritable bowel syndrome)    Osteoporosis    Past Surgical History:  Procedure Laterality Date   APPENDECTOMY     BACK SURGERY     x2   cataract surgery      bilateral    [place]     for glaucoma    left eye peeling surgery      left femur bone surgery      1978   REVERSE SHOULDER ARTHROPLASTY Right 10/10/2014   Procedure: RIGHT REVERSE SHOULDER ARTHROPLASTY;  Surgeon: Justice Britain, MD;  Location: [facility];  Service: Orthopedics;  Laterality: Right;   ROTATOR CUFF REPAIR     TOTAL HIP ARTHROPLASTY Left 02/20/2014   Procedure: LEFT TOTAL HIP ARTHROPLASTY ANTERIOR APPROACH;  Surgeon: Gearlean Alf, MD;  Location: [facility];  Service: Orthopedics;  Laterality: Left;   WRIST SURGERY     left     Allergies  Allergen Reactions   Robaxin [Methocarbamol] Rash and Other (See Comments)    Red rash on right side of face and forehead cleared after benadryl given, per [name], RN   Ancef [Cefazolin] Diarrhea   Dilaudid [Hydromorphone] Other (See Comments)    Stomach upset  Fosamax [Alendronate] Other (See Comments)    Upset stomach   Hyoscyamine Other (See Comments)    "Caused gas"   Morphine And Related Itching   Nsaids Diarrhea   Parathyroid Hormone (Recomb) Other (See Comments)    was not able to give herself the shot   Penicillins Itching    Tolerated Unasyn Has patient had a PCN reaction causing immediate rash, facial/tongue/throat swelling, SOB or lightheadedness with hypotension: Yes Has patient had a PCN reaction causing severe rash involving mucus membranes or skin necrosis: No Has patient had a PCN reaction that required hospitalization: Was in hospital Has patient had a PCN reaction occurring within the last 10 years: No If all of  the above answers are "NO", then may proceed with Cephalosporin use.    Tape Other (See Comments)    Bandages and leads cause bruising and weeping at sites   Flurbiprofen Diarrhea    No facility-administered encounter medications on file as of 12/10/2021.   Outpatient Encounter Medications as of 12/10/2021  Medication Sig   aspirin EC 81 MG tablet Take 81 mg by mouth daily.   calcium carbonate (OSCAL) 1500 (600 Ca) MG TABS tablet Take 600 mg of elemental calcium by mouth daily.   Cholecalciferol 25 MCG (1000 UT) tablet Take 1,000 Units by mouth daily.   denosumab (PROLIA) 60 MG/ML SOSY injection Inject 60 mg into the skin every 6 (six) months. Last injection 06/02/21   divalproex (DEPAKOTE SPRINKLE) 125 MG capsule Take 125 mg by mouth 2 (two) times daily.   docusate sodium 100 MG CAPS Take 100 mg by mouth 2 (two) times daily.   gabapentin (NEURONTIN) 300 MG capsule Take 300 mg by mouth 3 (three) times daily.   lactose free nutrition (BOOST) LIQD Take 237 mLs by mouth 2 (two) times daily between meals.   LORazepam (ATIVAN) 0.5 MG tablet Take 1 tablet (0.5 mg total) by mouth 2 (two) times daily as needed for anxiety.   losartan (COZAAR) 50 MG tablet Take 1 tablet (50 mg total) by mouth daily.   MELATONIN PO Take 3 mg by mouth at bedtime as needed (sleep).   mirtazapine (REMERON) 15 MG tablet Take 15 mg by mouth at bedtime.   Multiple Vitamins-Minerals (PRESERVISION AREDS 2) CAPS Take 1 capsule by mouth daily.   oxyCODONE-acetaminophen (PERCOCET/ROXICET) 5-325 MG tablet Take 1 tablet by mouth every 6 (six) hours as needed for severe pain.   QUEtiapine (SEROQUEL) 25 MG tablet Take 25 mg by mouth at bedtime.   QUEtiapine (SEROQUEL) 25 MG tablet Take 12.5 mg by mouth at bedtime.    Review of Systems  Unable to perform ROS: Mental status change   Immunization History  Administered Date(s) Administered   Influenza Split 07/04/2009, 04/09/2010, 05/06/2011, 03/29/2012, 05/03/2013   Influenza,  High Dose Seasonal PF 05/02/2013, 05/25/2017, 04/26/2018, 04/24/2019   Influenza, Quadrivalent, Recombinant, Inj, Pf 04/26/2020   Influenza,inj,Quad PF,6+ Mos 05/26/2021   Influenza,inj,quad, With Preservative 04/18/2017   Influenza-Unspecified 05/12/2015   PFIZER(Purple Top)SARS-COV-2 Vaccination 08/25/2019, 09/19/2019   Pneumococcal Conjugate-13 01/29/2014   Pneumococcal Polysaccharide-23 06/07/2007   Td 05/02/2003   Tdap 10/18/2011   Zoster, Live 01/17/2006   Pertinent  Health Maintenance Due  Topic Date Due   FOOT EXAM  Never done   OPHTHALMOLOGY EXAM  Never done   DEXA SCAN  Never done   INFLUENZA VACCINE  02/16/2022   HEMOGLOBIN A1C  04/13/2022      10/14/2021    7:30 PM 10/15/2021    7:42  AM 10/16/2021   12:00 AM 10/16/2021    9:00 AM 12/10/2021    4:03 PM  Fall Risk  Patient Fall Risk Level High fall risk High fall risk High fall risk High fall risk High fall risk   Functional Status Survey:    Vitals:   12/10/21 1330  BP: (!) 142/60  Pulse: 90  Resp: (!) 22  Temp: (!) 97.5 F (36.4 C)  SpO2: 98%  Weight: 123 lb 14.4 oz (56.2 kg)  Height: '5\' 2"'$  (1.575 m)   Body mass index is 22.66 kg/m. Physical Exam Vitals reviewed.  Constitutional:      General: She is not in acute distress. HENT:     Head: Normocephalic.  Eyes:     General:        Right eye: No discharge.        Left eye: No discharge.     Pupils: Pupils are equal, round, and reactive to light.  Cardiovascular:     Rate and Rhythm: Normal rate. Rhythm irregular.     Pulses: Normal pulses.     Heart sounds: Normal heart sounds.  Pulmonary:     Effort: Pulmonary effort is normal.     Breath sounds: Examination of the right-lower field reveals decreased breath sounds. Examination of the left-lower field reveals decreased breath sounds. Decreased breath sounds present.  Abdominal:     General: Bowel sounds are normal. There is no distension.     Palpations: Abdomen is soft.     Tenderness: There is  no abdominal tenderness.  Musculoskeletal:     Right lower leg: No edema.     Left lower leg: No edema.  Skin:    General: Skin is warm and dry.     Capillary Refill: Capillary refill takes less than 2 seconds.     Comments: Warm to touch  Neurological:     General: No focal deficit present.     Mental Status: Mental status is at baseline. She is lethargic.     Motor: Weakness present.     Gait: Gait abnormal.  Psychiatric:        Attention and Perception: She is attentive.        Cognition and Memory: Cognition is impaired. Memory is impaired.    Labs reviewed: Recent Labs    10/13/21 0704 10/15/21 0640 10/16/21 0328 10/23/21 0000  NA 140 133* 137 138  K 4.1 3.1* 4.0 5.3*  CL 111 101 111 103  CO2 22 23 21* 25*  GLUCOSE 113* 118* 105*  --   BUN 6* 11 15 28*  CREATININE 0.57 0.60 0.61 1.0  CALCIUM 7.8* 8.7* 8.4* 9.9  MG  --  1.7  --   --   PHOS  --  2.7  --   --    Recent Labs    10/10/21 1434 10/11/21 0325 10/12/21 0322 10/23/21 0000  AST 59* 49* 39 18  ALT '28 25 24 17  '$ ALKPHOS 50 41 34* 60  BILITOT 0.8 0.6 0.4  --   PROT 7.5 5.8* 5.1*  --   ALBUMIN 3.8 3.1* 2.7* 3.6   Recent Labs    10/10/21 1434 10/12/21 0322 10/13/21 0704 10/16/21 0328 10/23/21 0000  WBC 14.5* 9.3 9.4 9.2 7.7  NEUTROABS 12.4*  --   --   --  4,766.00  HGB 14.9 10.8* 11.6* 12.0 13.0  HCT 44.4 33.5* 35.5* 36.7 40  MCV 94.3 98.0 96.7 94.8  --   PLT 341 259 282  365 450*   No results found for: TSH Lab Results  Component Value Date   HGBA1C 6.8 (H) 10/11/2021   No results found for: CHOL, HDL, LDLCALC, LDLDIRECT, TRIG, CHOLHDL  Significant Diagnostic Results in last 30 days:  No results found.  Assessment/Plan 1. Lethargy - arousal to painful stimuli, fever (99.8), diminished lung sounds - ? Aspiration PNA vs UTI/sepsis - will send to ED for further evaluation    Family/ staff Communication: plan discussed with nurse  Labs/tests ordered:  none

## 2021-12-11 ENCOUNTER — Encounter: Payer: Self-pay | Admitting: Orthopedic Surgery

## 2021-12-11 ENCOUNTER — Non-Acute Institutional Stay (SKILLED_NURSING_FACILITY): Payer: Medicare Other | Admitting: Orthopedic Surgery

## 2021-12-11 DIAGNOSIS — R29898 Other symptoms and signs involving the musculoskeletal system: Secondary | ICD-10-CM | POA: Diagnosis not present

## 2021-12-11 DIAGNOSIS — M159 Polyosteoarthritis, unspecified: Secondary | ICD-10-CM | POA: Diagnosis not present

## 2021-12-11 DIAGNOSIS — F03C11 Unspecified dementia, severe, with agitation: Secondary | ICD-10-CM

## 2021-12-11 DIAGNOSIS — Z743 Need for continuous supervision: Secondary | ICD-10-CM | POA: Diagnosis not present

## 2021-12-11 DIAGNOSIS — R400 Somnolence: Secondary | ICD-10-CM

## 2021-12-11 DIAGNOSIS — R41841 Cognitive communication deficit: Secondary | ICD-10-CM | POA: Diagnosis not present

## 2021-12-11 DIAGNOSIS — M6281 Muscle weakness (generalized): Secondary | ICD-10-CM | POA: Diagnosis not present

## 2021-12-11 DIAGNOSIS — R404 Transient alteration of awareness: Secondary | ICD-10-CM | POA: Diagnosis not present

## 2021-12-11 DIAGNOSIS — R1312 Dysphagia, oropharyngeal phase: Secondary | ICD-10-CM | POA: Diagnosis not present

## 2021-12-11 DIAGNOSIS — F03C0 Unspecified dementia, severe, without behavioral disturbance, psychotic disturbance, mood disturbance, and anxiety: Secondary | ICD-10-CM | POA: Diagnosis not present

## 2021-12-11 DIAGNOSIS — R1310 Dysphagia, unspecified: Secondary | ICD-10-CM | POA: Diagnosis not present

## 2021-12-11 MED ORDER — ACETAMINOPHEN 500 MG PO TABS: 1000.0000 mg | ORAL_TABLET | Freq: Three times a day (TID) | ORAL | 0 refills | Status: DC | PRN

## 2021-12-11 NOTE — ED Notes (Signed)
Pt had a a belonging bag left behind. I called friends home and left a voicemail. Pt belonging bag will be placed in cabinet in section 1-8

## 2021-12-11 NOTE — ED Notes (Signed)
Opened chart to locate personal belongings

## 2021-12-11 NOTE — Progress Notes (Signed)
Location:   [place] Room Number: 35 A Place of Service:  SNF (31) Provider:  Windell Moulding, NP  Virgie Dad, MD  Patient Care Team: Virgie Dad, MD as PCP - General (Internal Medicine)  Extended Emergency Contact Information Primary Emergency Contact: [place] Phone: [phone] Mobile Phone: [phone] Relation: Relative Secondary Emergency Contact: [place] Mobile Phone: [phone] Relation: Grandson Interpreter needed? No  Code Status:  DNR Goals of care: Advanced Directive information    12/11/2021    1:29 PM  Advanced Directives  Does Patient Have a Medical Advance Directive? Yes  Type of Paramedic of [place];Out of facility DNR (pink MOST or yellow form);Living will  Does patient want to make changes to medical advance directive? No - Patient declined  Copy of [place] in Chart? Yes - validated most recent copy scanned in chart (See row information)  Pre-existing out of facility DNR order (yellow form or pink MOST form) Yellow form placed in chart (order not valid for inpatient use)     Chief Complaint  Patient presents with   Acute Visit    Altered mental status    HPI:  Pt is a 88 y.o. female seen today for an acute visit for altered mental status.   05/25 was noted to be lethargic/ fever 99.8 in the evening. She was sent to the ED for further evaluation. WBC 11.1, hgb 10.4, sodium 134, glucose 112, BUN/creat 35/0.91, UA unremarkable, valproic acid level 13, covid negative, blood and urine cultures pending. CT chest noted mild pulmonary edema/ trace pleural effusion. CT head with no acute intracranial abnormality. She was given fluids and discharged back to [facility].   Today, she is alert, eating lunch in dining room. Follows commands. HOH. Poor historian due to dementia.    Past Medical History:  Diagnosis Date   Anxiety    Arthritis    Cancer ([place])     hx of skin cancer removal on hand    Colitis    Diabetes mellitus without complication ([place])    Gallstones    Hyperlipidemia    Hypertension    IBS (irritable bowel syndrome)    Osteoporosis    Past Surgical History:  Procedure Laterality Date   APPENDECTOMY     BACK SURGERY     x2   cataract surgery      bilateral    [place]     for glaucoma    left eye peeling surgery      left femur bone surgery      1978   REVERSE SHOULDER ARTHROPLASTY Right 10/10/2014   Procedure: RIGHT REVERSE SHOULDER ARTHROPLASTY;  Surgeon: Justice Britain, MD;  Location: [place];  Service: Orthopedics;  Laterality: Right;   ROTATOR CUFF REPAIR     TOTAL HIP ARTHROPLASTY Left 02/20/2014   Procedure: LEFT TOTAL HIP ARTHROPLASTY ANTERIOR APPROACH;  Surgeon: Gearlean Alf, MD;  Location: [facility];  Service: Orthopedics;  Laterality: Left;   WRIST SURGERY     left     Allergies  Allergen Reactions   Robaxin [Methocarbamol] Rash and Other (See Comments)    Red rash on right side of face and forehead cleared after benadryl given, per [name], RN   Ancef [Cefazolin] Diarrhea   Dilaudid [Hydromorphone] Other (See Comments)    Stomach upset   Fosamax [Alendronate] Other (See Comments)    Upset stomach   Hyoscyamine Other (See Comments)    "  Caused gas"   Morphine And Related Itching   Nsaids Diarrhea   Parathyroid Hormone (Recomb) Other (See Comments)    was not able to give herself the shot   Penicillins Itching    Tolerated Unasyn Has patient had a PCN reaction causing immediate rash, facial/tongue/throat swelling, SOB or lightheadedness with hypotension: Yes Has patient had a PCN reaction causing severe rash involving mucus membranes or skin necrosis: No Has patient had a PCN reaction that required hospitalization: Was in hospital Has patient had a PCN reaction occurring within the last 10 years: No If all of the above answers are "NO", then may proceed with Cephalosporin use.     Tape Other (See Comments)    Bandages and leads cause bruising and weeping at sites   Flurbiprofen Diarrhea    Allergies as of 12/11/2021       Reactions   Robaxin [methocarbamol] Rash, Other (See Comments)   Red rash on right side of face and forehead cleared after benadryl given, per [name], RN   Ancef [cefazolin] Diarrhea   Dilaudid [hydromorphone] Other (See Comments)   Stomach upset   Fosamax [alendronate] Other (See Comments)   Upset stomach   Hyoscyamine Other (See Comments)   "Caused gas"   Morphine And Related Itching   Nsaids Diarrhea   Parathyroid Hormone (recomb) Other (See Comments)   was not able to give herself the shot   Penicillins Itching   Tolerated Unasyn Has patient had a PCN reaction causing immediate rash, facial/tongue/throat swelling, SOB or lightheadedness with hypotension: Yes Has patient had a PCN reaction causing severe rash involving mucus membranes or skin necrosis: No Has patient had a PCN reaction that required hospitalization: Was in hospital Has patient had a PCN reaction occurring within the last 10 years: No If all of the above answers are "NO", then may proceed with Cephalosporin use.   Tape Other (See Comments)   Bandages and leads cause bruising and weeping at sites   Flurbiprofen Diarrhea        Medication List        Accurate as of Dec 11, 2021  1:30 PM. If you have any questions, ask your nurse or doctor.          aspirin EC 81 MG tablet Take 81 mg by mouth daily.   calcium carbonate 1500 (600 Ca) MG Tabs tablet Commonly known as: OSCAL Take 600 mg of elemental calcium by mouth daily.   Cholecalciferol 25 MCG (1000 UT) tablet Take 1,000 Units by mouth daily.   denosumab 60 MG/ML Sosy injection Commonly known as: PROLIA Inject 60 mg into the skin every 6 (six) months. Last injection 06/02/21   divalproex 125 MG capsule Commonly known as: DEPAKOTE SPRINKLE Take 125 mg by mouth 2 (two) times daily.   DSS 100 MG  Caps Take 100 mg by mouth 2 (two) times daily.   gabapentin 300 MG capsule Commonly known as: NEURONTIN Take 300 mg by mouth 3 (three) times daily.   lactose free nutrition Liqd Take 237 mLs by mouth 2 (two) times daily between meals.   LORazepam 0.5 MG tablet Commonly known as: ATIVAN Take 1 tablet (0.5 mg total) by mouth 2 (two) times daily as needed for anxiety.   losartan 50 MG tablet Commonly known as: COZAAR Take 1 tablet (50 mg total) by mouth daily.   MELATONIN PO Take 3 mg by mouth at bedtime as needed (sleep).   MILK OF MAGNESIA PO Take 30 mLs by mouth  as needed.   mirtazapine 15 MG tablet Commonly known as: REMERON Take 15 mg by mouth at bedtime.   oxyCODONE-acetaminophen 5-325 MG tablet Commonly known as: PERCOCET/ROXICET Take 1 tablet by mouth every 6 (six) hours as needed for severe pain.   PreserVision AREDS 2 Caps Take 1 capsule by mouth daily.   QUEtiapine 25 MG tablet Commonly known as: SEROQUEL Take 25 mg by mouth at bedtime.   QUEtiapine 25 MG tablet Commonly known as: SEROQUEL Take 12.5 mg by mouth See admin instructions. Give 1/2 tablet by mouth daily at 1pm        Review of Systems  Unable to perform ROS: Dementia   Immunization History  Administered Date(s) Administered   Influenza Split 07/04/2009, 04/09/2010, 05/06/2011, 03/29/2012, 05/03/2013   Influenza, High Dose Seasonal PF 05/02/2013, 05/25/2017, 04/26/2018, 04/24/2019   Influenza, Quadrivalent, Recombinant, Inj, Pf 04/26/2020   Influenza,inj,Quad PF,6+ Mos 05/26/2021   Influenza,inj,quad, With Preservative 04/18/2017   Influenza-Unspecified 05/12/2015   PFIZER(Purple Top)SARS-COV-2 Vaccination 08/25/2019, 09/19/2019   Pneumococcal Conjugate-13 01/29/2014   Pneumococcal Polysaccharide-23 06/07/2007   Td 05/02/2003   Tdap 10/18/2011   Zoster, Live 01/17/2006   Pertinent  Health Maintenance Due  Topic Date Due   FOOT EXAM  Never done   OPHTHALMOLOGY EXAM  Never done    DEXA SCAN  Never done   INFLUENZA VACCINE  02/16/2022   HEMOGLOBIN A1C  04/13/2022      10/14/2021    7:30 PM 10/15/2021    7:42 AM 10/16/2021   12:00 AM 10/16/2021    9:00 AM 12/10/2021    4:03 PM  Fall Risk  Patient Fall Risk Level High fall risk High fall risk High fall risk High fall risk High fall risk   Functional Status Survey:    Vitals:   12/11/21 1314  BP: (!) 142/60  Pulse: 90  Temp: (!) 97.5 F (36.4 C)  SpO2: 98%  Weight: 123 lb 14.4 oz (56.2 kg)  Height: '5\' 2"'$  (1.575 m)   Body mass index is 22.66 kg/m. Physical Exam Vitals reviewed.  Constitutional:      General: She is not in acute distress. HENT:     Head: Normocephalic.     Ears:     Comments: HOH Eyes:     General:        Right eye: No discharge.        Left eye: No discharge.     Extraocular Movements: Extraocular movements intact.     Pupils: Pupils are equal, round, and reactive to light.  Cardiovascular:     Rate and Rhythm: Normal rate and regular rhythm.     Pulses: Normal pulses.     Heart sounds: Normal heart sounds.  Pulmonary:     Effort: Pulmonary effort is normal. No respiratory distress.     Breath sounds: Normal breath sounds. No wheezing.  Abdominal:     General: Bowel sounds are normal. There is no distension.     Palpations: Abdomen is soft.     Tenderness: There is no abdominal tenderness.  Musculoskeletal:     Cervical back: Neck supple.     Right lower leg: No edema.     Left lower leg: No edema.  Skin:    General: Skin is warm and dry.     Capillary Refill: Capillary refill takes less than 2 seconds.  Neurological:     General: No focal deficit present.     Mental Status: She is alert. Mental status is at  baseline.     Motor: Weakness present.     Gait: Gait abnormal.     Comments: walker  Psychiatric:        Mood and Affect: Mood normal.        Behavior: Behavior normal.        Cognition and Memory: Cognition is impaired. Memory is impaired.     Comments:  Follows commands, alert to self     Labs reviewed: Recent Labs    10/15/21 0640 10/16/21 0328 10/23/21 0000 12/10/21 1601 12/10/21 1823  NA 133* 137 138 137 134*  K 3.1* 4.0 5.3* 5.3* 5.1  CL 101 111 103 100 101  CO2 23 21* 25* 27 27  GLUCOSE 118* 105*  --  99 112*  BUN 11 15 28* 38* 35*  CREATININE 0.60 0.61 1.0 0.95 0.91  CALCIUM 8.7* 8.4* 9.9 9.4 9.0  MG 1.7  --   --   --   --   PHOS 2.7  --   --   --   --    Recent Labs    10/11/21 0325 10/12/21 0322 10/23/21 0000 12/10/21 1601  AST 49* 39 18 15  ALT '25 24 17 11  '$ ALKPHOS 41 34* 60 42  BILITOT 0.6 0.4  --  0.4  PROT 5.8* 5.1*  --  6.8  ALBUMIN 3.1* 2.7* 3.6 2.9*   Recent Labs    10/10/21 1434 10/12/21 0322 10/13/21 0704 10/16/21 0328 10/23/21 0000 12/10/21 1601  WBC 14.5*   < > 9.4 9.2 7.7 11.1*  NEUTROABS 12.4*  --   --   --  4,766.00 7.3  HGB 14.9   < > 11.6* 12.0 13.0 10.4*  HCT 44.4   < > 35.5* 36.7 40 32.2*  MCV 94.3   < > 96.7 94.8  --  98.2  PLT 341   < > 282 365 450* 363   < > = values in this interval not displayed.   No results found for: TSH Lab Results  Component Value Date   HGBA1C 6.8 (H) 10/11/2021   No results found for: CHOL, HDL, LDLCALC, LDLDIRECT, TRIG, CHOLHDL  Significant Diagnostic Results in last 30 days:  CT Head Wo Contrast  Result Date: 12/10/2021 CLINICAL DATA:  Mental status change, unknown cause EXAM: CT HEAD WITHOUT CONTRAST TECHNIQUE: Contiguous axial images were obtained from the base of the skull through the vertex without intravenous contrast. RADIATION DOSE REDUCTION: This exam was performed according to the departmental dose-optimization program which includes automated exposure control, adjustment of the mA and/or kV according to patient size and/or use of iterative reconstruction technique. COMPARISON:  CT head October 10, 2021 FINDINGS: Brain: No evidence of acute infarction, hemorrhage, hydrocephalus, extra-axial collection or mass lesion/mass effect. Patchy white  matter hypoattenuation, nonspecific but compatible with chronic microvascular disease. Cerebral atrophy. Vascular: Calcific intracranial atherosclerosis. Skull: No acute fracture.  Hyperostosis frontalis. Sinuses/Orbits: Clear sinuses.  No acute orbital findings. Other: No mastoid effusions. IMPRESSION: 1. No evidence of acute intracranial abnormality. 2. Chronic microvascular ischemic disease and cerebral atrophy (ICD10-G31.9). Electronically Signed   By: Margaretha Sheffield M.D.   On: 12/10/2021 16:50   CT Chest W Contrast  Result Date: 12/10/2021 CLINICAL DATA:  Respiratory illness, nondiagnostic xray EXAM: CT CHEST WITH CONTRAST TECHNIQUE: Multidetector CT imaging of the chest was performed during intravenous contrast administration. RADIATION DOSE REDUCTION: This exam was performed according to the departmental dose-optimization program which includes automated exposure control, adjustment of the mA and/or kV according  to patient size and/or use of iterative reconstruction technique. CONTRAST:  38m OMNIPAQUE IOHEXOL 300 MG/ML  SOLN COMPARISON:  Same day radiograph, Two-view chest radiograph Nov 26, 2017, CT abdomen pelvis 06/20/2018. FINDINGS: Cardiovascular: Mild cardiomegaly.Coronary artery, mitral annular, and aortic valve calcifications.No pericardial disease. No central PE. Moderate atherosclerosis of the aortic arch and descending thoracic aorta.Delete Mediastinum/Nodes: No lymphadenopathy.There is a 2.1 cm right thyroid cyst, no follow-up recommended.Small hiatal hernia.The trachea is unremarkable. Lungs/Pleura: Airways are patent. There is mild bronchial wall thickening. There is mild interlobular septal thickening and ground-glass opacities bilaterally. There is no focal airspace consolidation. Trace right pleural effusion.No suspicious pulmonary nodules. No pneumothorax. Upper Abdomen: Prior cholecystectomy.  No acute abnormality. Musculoskeletal: There are multiple compression deformities in the  thoracic spine, which are chronic, though it note there is a progressive compression deformity of T11 in comparison to prior CT in December 2019. Other thoracic compression deformities and L1 compression deformity appear similar to prior two-view chest radiograph in May 2019. There is a chronic manubrium fracture deformity in of the adjacent right medial clavicle. Reverse right shoulder arthroplasty. Severe left glenohumeral osteoarthritis. Chronic right upper rib injuries. Chronic left mid rib injury. IMPRESSION: Mild pulmonary edema with trace right pleural effusion. Mild cardiomegaly. Multiple chronic compression deformities of the thoracic spine, with progressive height loss at T11. Electronically Signed   By: JMaurine SimmeringM.D.   On: 12/10/2021 18:35   DG Chest Port 1 View  Result Date: 12/10/2021 CLINICAL DATA:  Provided history: Questionable sepsis-evaluate for abnormality. EXAM: PORTABLE CHEST 1 VIEW COMPARISON:  Same day chest radiographs 12/10/2021. Additional prior chest radiographs 11/26/2017 and earlier. FINDINGS: The patient is significantly rotated to the right, limiting overall evaluation and limiting evaluation of heart size. Ill-defined opacities within the perihilar right lung and left lung base. No evidence of pleural effusion or pneumothorax. No acute bony abnormality identified. Prior reverse right shoulder arthroplasty, incompletely imaged. IMPRESSION: Significant patient rotation to the right, limiting evaluation. Ill-defined opacities within the perihilar right lung and left lung base. Findings may reflect atelectasis. However, pneumonia/aspiration cannot be excluded. Electronically Signed   By: KKellie SimmeringD.O.   On: 12/10/2021 16:51    Assessment/Plan 1. Somnolence - 05/25 ED encounter due to AMS/ fever 99.8 - labs unremarkable - CT chest/head with no acute findings- mild pulmonary edema noted - Blood and urine cultures pending - ? Progressing dementia - will reduce gabapentin  to 200 mg po TID - will reduce Seroquel to 12.5 mg po qhs - discontinue oxycodone - start tylenol 1000 mg po TID prn - may consider consulting hospice in future  2. Severe dementia with agitation, unspecified dementia type ([place] - no behavioral outbursts - HOH - see above - cont Depakote, Seroquel and Ativan - cont skilled nursing care     Family/ staff Communication: plan discussed with patient and nurse  Labs/tests ordered: Blood and urine cultures pending

## 2021-12-12 LAB — URINE CULTURE: Culture: 20000 — AB

## 2021-12-13 ENCOUNTER — Telehealth (HOSPITAL_BASED_OUTPATIENT_CLINIC_OR_DEPARTMENT_OTHER): Payer: Self-pay | Admitting: *Deleted

## 2021-12-13 NOTE — Progress Notes (Signed)
ED Antimicrobial Stewardship Positive Culture Follow Up   Marissa Ryan is an 88 y.o. female who presented to [facility] on 12/10/2021 with a chief complaint of  Chief Complaint  Patient presents with   Altered Mental Status    Recent Results (from the past 720 hour(s))  Blood Culture (routine x 2)     Status: None (Preliminary result)   Collection Time: 12/10/21  4:16 PM   Specimen: BLOOD  Result Value Ref Range Status   Specimen Description   Final    BLOOD RIGHT ANTECUBITAL Performed at [facility], [place] [address], [place], [place] [address]    Special Requests   Final    BOTTLES DRAWN AEROBIC AND ANAEROBIC Blood Culture adequate volume Performed at [facility] [address], [place], [place] [address]    Culture   Final    NO GROWTH 2 DAYS Performed at [facility] [address], [place], [place] [address]    Report Status PENDING  Incomplete  Blood Culture (routine x 2)     Status: None (Preliminary result)   Collection Time: 12/10/21  4:30 PM   Specimen: BLOOD  Result Value Ref Range Status   Specimen Description   Final    BLOOD LEFT ANTECUBITAL Performed at [facility] [address], [place], [place] [address]    Special Requests   Final    BOTTLES DRAWN AEROBIC AND ANAEROBIC Blood Culture adequate volume Performed at [facility] [address], [place], [place] [address]    Culture   Final    NO GROWTH 2 DAYS Performed at [facility] [address] [place], [place] [address]    Report Status PENDING  Incomplete  Resp Panel by RT-PCR (Flu A&B, Covid) Anterior Nasal Swab     Status: None   Collection Time: 12/10/21  5:17 PM   Specimen: Anterior Nasal Swab  Result Value Ref Range Status   SARS Coronavirus 2 by RT PCR NEGATIVE NEGATIVE Final    Comment: (NOTE) SARS-CoV-2 target nucleic acids are NOT DETECTED.  The SARS-CoV-2 RNA is generally detectable in upper  respiratory specimens during the acute phase of infection. The lowest concentration of SARS-CoV-2 viral copies this assay can detect is 138 copies/mL. A negative result does not preclude SARS-Cov-2 infection and should not be used as the sole basis for treatment or other patient management decisions. A negative result may occur with  improper specimen collection/handling, submission of specimen other than nasopharyngeal swab, presence of viral mutation(s) within the areas targeted by this assay, and inadequate number of viral copies(<138 copies/mL). A negative result must be combined with clinical observations, patient history, and epidemiological information. The expected result is Negative.  Fact Sheet for Patients:  EntrepreneurPulse.com.au  Fact Sheet for Healthcare Providers:  IncredibleEmployment.be  This test is no t yet approved or cleared by the [place] FDA and  has been authorized for detection and/or diagnosis of SARS-CoV-2 by FDA under an Emergency Use Authorization (EUA). This EUA will remain  in effect (meaning this test can be used) for the duration of the COVID-19 declaration under Section 564(b)(1) of the Act, 21 U.S.C.section 360bbb-3(b)(1), unless the authorization is terminated  or revoked sooner.       Influenza A by PCR NEGATIVE NEGATIVE Final   Influenza B by PCR NEGATIVE NEGATIVE Final    Comment: (NOTE) The Xpert Xpress SARS-CoV-2/FLU/RSV plus assay is intended as an aid in the  diagnosis of influenza from Nasopharyngeal swab specimens and should not be used as a sole basis for treatment. Nasal washings and aspirates are unacceptable for Xpert Xpress SARS-CoV-2/FLU/RSV testing.  Fact Sheet for Patients: EntrepreneurPulse.com.au  Fact Sheet for Healthcare Providers: IncredibleEmployment.be  This test is not yet approved or cleared by the [place] FDA and has been  authorized for detection and/or diagnosis of SARS-CoV-2 by FDA under an Emergency Use Authorization (EUA). This EUA will remain in effect (meaning this test can be used) for the duration of the COVID-19 declaration under Section 564(b)(1) of the Act, 21 U.S.C. section 360bbb-3(b)(1), unless the authorization is terminated or revoked.  Performed at [facility], [place] [address], [place], [place] [address]   Urine Culture     Status: Abnormal   Collection Time: 12/10/21  6:32 PM   Specimen: In/Out Cath Urine  Result Value Ref Range Status   Specimen Description   Final    IN/OUT CATH URINE Performed at [facility] [address] [place], [place] [address]    Special Requests   Final    NONE Performed at [facility], [place] [address], [place], [place] [address]    Culture 20,000 COLONIES/mL ENTEROCOCCUS FAECALIS (A)  Final   Report Status 12/12/2021 FINAL  Final   Organism ID, Bacteria ENTEROCOCCUS FAECALIS (A)  Final      Susceptibility   Enterococcus faecalis - MIC*    AMPICILLIN <=2 SENSITIVE Sensitive     NITROFURANTOIN <=16 SENSITIVE Sensitive     VANCOMYCIN 2 SENSITIVE Sensitive     * 20,000 COLONIES/mL ENTEROCOCCUS FAECALIS   Plan:  1) do UTI symptoms check 2) If pt has UTI symptoms, then amoxicillin 500 mg bid x7 days  ED Provider: Paulita Cradle, PA-C   Lynelle Doctor 12/13/2021, 8:54 AM Clinical Pharmacist [phone]

## 2021-12-14 DIAGNOSIS — F01B2 Vascular dementia, moderate, with psychotic disturbance: Secondary | ICD-10-CM | POA: Diagnosis not present

## 2021-12-14 DIAGNOSIS — R41841 Cognitive communication deficit: Secondary | ICD-10-CM | POA: Diagnosis not present

## 2021-12-14 DIAGNOSIS — R29898 Other symptoms and signs involving the musculoskeletal system: Secondary | ICD-10-CM | POA: Diagnosis not present

## 2021-12-14 DIAGNOSIS — R1312 Dysphagia, oropharyngeal phase: Secondary | ICD-10-CM | POA: Diagnosis not present

## 2021-12-14 DIAGNOSIS — F331 Major depressive disorder, recurrent, moderate: Secondary | ICD-10-CM | POA: Diagnosis not present

## 2021-12-14 DIAGNOSIS — F411 Generalized anxiety disorder: Secondary | ICD-10-CM | POA: Diagnosis not present

## 2021-12-14 DIAGNOSIS — M6281 Muscle weakness (generalized): Secondary | ICD-10-CM | POA: Diagnosis not present

## 2021-12-14 DIAGNOSIS — R1310 Dysphagia, unspecified: Secondary | ICD-10-CM | POA: Diagnosis not present

## 2021-12-14 DIAGNOSIS — F03C0 Unspecified dementia, severe, without behavioral disturbance, psychotic disturbance, mood disturbance, and anxiety: Secondary | ICD-10-CM | POA: Diagnosis not present

## 2021-12-14 NOTE — Telephone Encounter (Signed)
Post ED Visit - Positive Culture Follow-up: Unsuccessful Patient Follow-up  Culture assessed and recommendations reviewed by:  '[x]'$  Onh Phamr, Pharm.D. '[]'$  Heide Guile, Pharm.D., BCPS AQ-ID '[]'$  Parks Neptune, Pharm.D., BCPS '[]'$  Alycia Rossetti, Pharm.D., BCPS '[]'$  [place], [place].D., BCPS, AAHIVP '[]'$  Legrand Como, Pharm.D., BCPS, AAHIVP '[]'$  Wynell Balloon, PharmD '[]'$  Vincenza Hews, PharmD, BCPS  Positive urine culture  '[x]'$  Patient discharged without antimicrobial prescription and treatment is now indicated '[]'$  Organism is resistant to prescribed ED discharge antimicrobial '[]'$  Patient with positive blood cultures   [facility] multiple times for symptom check with no answer. Faxed copy of urine culture to [place] 12/14/2021, 8:46 AM

## 2021-12-15 LAB — CULTURE, BLOOD (ROUTINE X 2)
Culture: NO GROWTH
Culture: NO GROWTH
Special Requests: ADEQUATE
Special Requests: ADEQUATE

## 2021-12-16 DIAGNOSIS — R1310 Dysphagia, unspecified: Secondary | ICD-10-CM | POA: Diagnosis not present

## 2021-12-16 DIAGNOSIS — R41841 Cognitive communication deficit: Secondary | ICD-10-CM | POA: Diagnosis not present

## 2021-12-16 DIAGNOSIS — F03C0 Unspecified dementia, severe, without behavioral disturbance, psychotic disturbance, mood disturbance, and anxiety: Secondary | ICD-10-CM | POA: Diagnosis not present

## 2021-12-16 DIAGNOSIS — R29898 Other symptoms and signs involving the musculoskeletal system: Secondary | ICD-10-CM | POA: Diagnosis not present

## 2021-12-16 DIAGNOSIS — M6281 Muscle weakness (generalized): Secondary | ICD-10-CM | POA: Diagnosis not present

## 2021-12-16 DIAGNOSIS — R1312 Dysphagia, oropharyngeal phase: Secondary | ICD-10-CM | POA: Diagnosis not present

## 2021-12-17 DIAGNOSIS — M4854XD Collapsed vertebra, not elsewhere classified, thoracic region, subsequent encounter for fracture with routine healing: Secondary | ICD-10-CM | POA: Diagnosis not present

## 2021-12-17 DIAGNOSIS — M6281 Muscle weakness (generalized): Secondary | ICD-10-CM | POA: Diagnosis not present

## 2021-12-17 DIAGNOSIS — R29898 Other symptoms and signs involving the musculoskeletal system: Secondary | ICD-10-CM | POA: Diagnosis not present

## 2021-12-17 DIAGNOSIS — R1312 Dysphagia, oropharyngeal phase: Secondary | ICD-10-CM | POA: Diagnosis not present

## 2021-12-17 DIAGNOSIS — Z9181 History of falling: Secondary | ICD-10-CM | POA: Diagnosis not present

## 2021-12-17 DIAGNOSIS — M4856XD Collapsed vertebra, not elsewhere classified, lumbar region, subsequent encounter for fracture with routine healing: Secondary | ICD-10-CM | POA: Diagnosis not present

## 2021-12-17 DIAGNOSIS — R2681 Unsteadiness on feet: Secondary | ICD-10-CM | POA: Diagnosis not present

## 2021-12-17 DIAGNOSIS — F03C0 Unspecified dementia, severe, without behavioral disturbance, psychotic disturbance, mood disturbance, and anxiety: Secondary | ICD-10-CM | POA: Diagnosis not present

## 2021-12-17 DIAGNOSIS — R41841 Cognitive communication deficit: Secondary | ICD-10-CM | POA: Diagnosis not present

## 2021-12-17 DIAGNOSIS — M6282 Rhabdomyolysis: Secondary | ICD-10-CM | POA: Diagnosis not present

## 2021-12-17 DIAGNOSIS — R1310 Dysphagia, unspecified: Secondary | ICD-10-CM | POA: Diagnosis not present

## 2021-12-18 DIAGNOSIS — R41841 Cognitive communication deficit: Secondary | ICD-10-CM | POA: Diagnosis not present

## 2021-12-18 DIAGNOSIS — M6281 Muscle weakness (generalized): Secondary | ICD-10-CM | POA: Diagnosis not present

## 2021-12-18 DIAGNOSIS — F03C0 Unspecified dementia, severe, without behavioral disturbance, psychotic disturbance, mood disturbance, and anxiety: Secondary | ICD-10-CM | POA: Diagnosis not present

## 2021-12-18 DIAGNOSIS — R1310 Dysphagia, unspecified: Secondary | ICD-10-CM | POA: Diagnosis not present

## 2021-12-18 DIAGNOSIS — R29898 Other symptoms and signs involving the musculoskeletal system: Secondary | ICD-10-CM | POA: Diagnosis not present

## 2021-12-18 DIAGNOSIS — R1312 Dysphagia, oropharyngeal phase: Secondary | ICD-10-CM | POA: Diagnosis not present

## 2021-12-21 DIAGNOSIS — R41841 Cognitive communication deficit: Secondary | ICD-10-CM | POA: Diagnosis not present

## 2021-12-21 DIAGNOSIS — R29898 Other symptoms and signs involving the musculoskeletal system: Secondary | ICD-10-CM | POA: Diagnosis not present

## 2021-12-21 DIAGNOSIS — M6281 Muscle weakness (generalized): Secondary | ICD-10-CM | POA: Diagnosis not present

## 2021-12-21 DIAGNOSIS — F03C0 Unspecified dementia, severe, without behavioral disturbance, psychotic disturbance, mood disturbance, and anxiety: Secondary | ICD-10-CM | POA: Diagnosis not present

## 2021-12-21 DIAGNOSIS — R1310 Dysphagia, unspecified: Secondary | ICD-10-CM | POA: Diagnosis not present

## 2021-12-21 DIAGNOSIS — R1312 Dysphagia, oropharyngeal phase: Secondary | ICD-10-CM | POA: Diagnosis not present

## 2021-12-22 DIAGNOSIS — R41841 Cognitive communication deficit: Secondary | ICD-10-CM | POA: Diagnosis not present

## 2021-12-22 DIAGNOSIS — R1310 Dysphagia, unspecified: Secondary | ICD-10-CM | POA: Diagnosis not present

## 2021-12-22 DIAGNOSIS — R1312 Dysphagia, oropharyngeal phase: Secondary | ICD-10-CM | POA: Diagnosis not present

## 2021-12-22 DIAGNOSIS — M6281 Muscle weakness (generalized): Secondary | ICD-10-CM | POA: Diagnosis not present

## 2021-12-22 DIAGNOSIS — F03C0 Unspecified dementia, severe, without behavioral disturbance, psychotic disturbance, mood disturbance, and anxiety: Secondary | ICD-10-CM | POA: Diagnosis not present

## 2021-12-22 DIAGNOSIS — R29898 Other symptoms and signs involving the musculoskeletal system: Secondary | ICD-10-CM | POA: Diagnosis not present

## 2021-12-23 DIAGNOSIS — R1312 Dysphagia, oropharyngeal phase: Secondary | ICD-10-CM | POA: Diagnosis not present

## 2021-12-23 DIAGNOSIS — M6281 Muscle weakness (generalized): Secondary | ICD-10-CM | POA: Diagnosis not present

## 2021-12-23 DIAGNOSIS — R41841 Cognitive communication deficit: Secondary | ICD-10-CM | POA: Diagnosis not present

## 2021-12-23 DIAGNOSIS — F03C0 Unspecified dementia, severe, without behavioral disturbance, psychotic disturbance, mood disturbance, and anxiety: Secondary | ICD-10-CM | POA: Diagnosis not present

## 2021-12-23 DIAGNOSIS — R1310 Dysphagia, unspecified: Secondary | ICD-10-CM | POA: Diagnosis not present

## 2021-12-23 DIAGNOSIS — R29898 Other symptoms and signs involving the musculoskeletal system: Secondary | ICD-10-CM | POA: Diagnosis not present

## 2021-12-25 DIAGNOSIS — R29898 Other symptoms and signs involving the musculoskeletal system: Secondary | ICD-10-CM | POA: Diagnosis not present

## 2021-12-25 DIAGNOSIS — R1310 Dysphagia, unspecified: Secondary | ICD-10-CM | POA: Diagnosis not present

## 2021-12-25 DIAGNOSIS — M6281 Muscle weakness (generalized): Secondary | ICD-10-CM | POA: Diagnosis not present

## 2021-12-25 DIAGNOSIS — F03C0 Unspecified dementia, severe, without behavioral disturbance, psychotic disturbance, mood disturbance, and anxiety: Secondary | ICD-10-CM | POA: Diagnosis not present

## 2021-12-25 DIAGNOSIS — R1312 Dysphagia, oropharyngeal phase: Secondary | ICD-10-CM | POA: Diagnosis not present

## 2021-12-25 DIAGNOSIS — R41841 Cognitive communication deficit: Secondary | ICD-10-CM | POA: Diagnosis not present

## 2021-12-28 ENCOUNTER — Encounter: Payer: Self-pay | Admitting: Adult Health

## 2021-12-28 ENCOUNTER — Non-Acute Institutional Stay (SKILLED_NURSING_FACILITY): Payer: Medicare Other | Admitting: Adult Health

## 2021-12-28 DIAGNOSIS — F03C0 Unspecified dementia, severe, without behavioral disturbance, psychotic disturbance, mood disturbance, and anxiety: Secondary | ICD-10-CM | POA: Diagnosis not present

## 2021-12-28 DIAGNOSIS — R41841 Cognitive communication deficit: Secondary | ICD-10-CM | POA: Diagnosis not present

## 2021-12-28 DIAGNOSIS — R29898 Other symptoms and signs involving the musculoskeletal system: Secondary | ICD-10-CM | POA: Diagnosis not present

## 2021-12-28 DIAGNOSIS — R1310 Dysphagia, unspecified: Secondary | ICD-10-CM | POA: Diagnosis not present

## 2021-12-28 DIAGNOSIS — M6281 Muscle weakness (generalized): Secondary | ICD-10-CM | POA: Diagnosis not present

## 2021-12-28 DIAGNOSIS — J302 Other seasonal allergic rhinitis: Secondary | ICD-10-CM | POA: Diagnosis not present

## 2021-12-28 DIAGNOSIS — R1312 Dysphagia, oropharyngeal phase: Secondary | ICD-10-CM | POA: Diagnosis not present

## 2021-12-28 NOTE — Progress Notes (Signed)
Location:  [facility] Room Number: 35-A Place of Service:  SNF (31) Provider:  Durenda Age, DNP, FNP-BC  Patient Care Team: Virgie Dad, MD as PCP - General (Internal Medicine)  Extended Emergency Contact Information Primary Emergency Contact: [place] Phone: [phone] Mobile Phone: [phone] Relation: Relative Secondary Emergency Contact: [place] Mobile Phone: [phone] Relation: Grandson Interpreter needed? No  Code Status:  DNR  Goals of care: Advanced Directive information    12/28/2021   11:40 AM  Advanced Directives  Does Patient Have a Medical Advance Directive? Yes  Type of Paramedic of [place];Living will;Out of facility DNR (pink MOST or yellow form)  Does patient want to make changes to medical advance directive? No - Patient declined  Copy of [place] in Chart? Yes - validated most recent copy scanned in chart (See row information)  Pre-existing out of facility DNR order (yellow form or pink MOST form) Yellow form placed in chart (order not valid for inpatient use)     Chief Complaint  Patient presents with   Acute Visit    Nasal drainage     HPI:  Pt is a 88 y.o. female seen today for an acute visit regarding post nasal drainage. Resident complained of having drainage at back of her throat. She drinks nectar thick liquids due to dysphagia. No reported coughing nor SOB. She is a long-term care resident of [facility].   Past Medical History:  Diagnosis Date   Anxiety    Arthritis    Cancer ([place])    hx of skin cancer removal on hand    Colitis    Diabetes mellitus without complication ([place])    Gallstones    Hyperlipidemia    Hypertension    IBS (irritable bowel syndrome)    Osteoporosis    Past Surgical History:  Procedure Laterality Date   APPENDECTOMY     BACK SURGERY     x2   cataract surgery      bilateral     [place]     for glaucoma    left eye peeling surgery      left femur bone surgery      1978   REVERSE SHOULDER ARTHROPLASTY Right 10/10/2014   Procedure: RIGHT REVERSE SHOULDER ARTHROPLASTY;  Surgeon: Justice Britain, MD;  Location: [facility];  Service: Orthopedics;  Laterality: Right;   ROTATOR CUFF REPAIR     TOTAL HIP ARTHROPLASTY Left 02/20/2014   Procedure: LEFT TOTAL HIP ARTHROPLASTY ANTERIOR APPROACH;  Surgeon: Gearlean Alf, MD;  Location: [facility];  Service: Orthopedics;  Laterality: Left;   WRIST SURGERY     left     Allergies  Allergen Reactions   Robaxin [Methocarbamol] Rash and Other (See Comments)    Red rash on right side of face and forehead cleared after benadryl given, per [name], RN   Ancef [Cefazolin] Diarrhea   Dilaudid [Hydromorphone] Other (See Comments)    Stomach upset   Fosamax [Alendronate] Other (See Comments)    Upset stomach   Hyoscyamine Other (See Comments)    "Caused gas"   Morphine And Related Itching   Nsaids Diarrhea   Parathyroid Hormone (Recomb) Other (See Comments)    was not able to give herself the shot   Penicillins Itching    Tolerated Unasyn Has patient had a PCN reaction causing immediate rash, facial/tongue/throat swelling, SOB or lightheadedness with hypotension: Yes Has patient  had a PCN reaction causing severe rash involving mucus membranes or skin necrosis: No Has patient had a PCN reaction that required hospitalization: Was in hospital Has patient had a PCN reaction occurring within the last 10 years: No If all of the above answers are "NO", then may proceed with Cephalosporin use.    Tape Other (See Comments)    Bandages and leads cause bruising and weeping at sites   Flurbiprofen Diarrhea    Outpatient Encounter Medications as of 12/28/2021  Medication Sig   acetaminophen (TYLENOL) 500 MG tablet Take 2 tablets (1,000 mg total) by mouth 3 (three) times daily as needed.   aspirin EC 81 MG tablet Take 81  mg by mouth daily.   calcium carbonate (OSCAL) 1500 (600 Ca) MG TABS tablet Take 600 mg of elemental calcium by mouth daily.   Cholecalciferol 25 MCG (1000 UT) tablet Take 1,000 Units by mouth daily.   denosumab (PROLIA) 60 MG/ML SOSY injection Inject 60 mg into the skin every 6 (six) months. Last injection 06/02/21   divalproex (DEPAKOTE SPRINKLE) 125 MG capsule Take 125 mg by mouth 2 (two) times daily.   docusate sodium 100 MG CAPS Take 100 mg by mouth 2 (two) times daily.   gabapentin (NEURONTIN) 300 MG capsule Take 200 mg by mouth 3 (three) times daily.   lactose free nutrition (BOOST) LIQD Take 237 mLs by mouth 2 (two) times daily between meals.   LORazepam (ATIVAN) 0.5 MG tablet Take 1 tablet (0.5 mg total) by mouth 2 (two) times daily as needed for anxiety.   losartan (COZAAR) 50 MG tablet Take 1 tablet (50 mg total) by mouth daily.   MELATONIN PO Take 3 mg by mouth at bedtime as needed (sleep).   mirtazapine (REMERON) 15 MG tablet Take 15 mg by mouth at bedtime.   Multiple Vitamins-Minerals (PRESERVISION AREDS 2) CAPS Take 1 capsule by mouth daily.   QUEtiapine (SEROQUEL) 25 MG tablet Take 12.5 mg by mouth at bedtime.   QUEtiapine (SEROQUEL) 25 MG tablet Take 12.5 mg by mouth See admin instructions. Give 1/2 tablet by mouth daily at 1pm   [DISCONTINUED] Magnesium Hydroxide (MILK OF MAGNESIA PO) Take 30 mLs by mouth as needed.   No facility-administered encounter medications on file as of 12/28/2021.    Review of Systems  Unable to obtain due to cognitive impairment.                                                                                                                                                        Immunization History  Administered Date(s) Administered   Influenza Split 07/04/2009, 04/09/2010, 05/06/2011, 03/29/2012, 05/03/2013   Influenza, High Dose Seasonal PF 05/02/2013, 05/25/2017, 04/26/2018, 04/24/2019   Influenza, Quadrivalent, Recombinant, Inj, Pf  04/26/2020   Influenza,inj,Quad PF,6+ Mos 05/26/2021   Influenza,inj,quad, With  Preservative 04/18/2017   Influenza-Unspecified 05/12/2015   PFIZER(Purple Top)SARS-COV-2 Vaccination 08/25/2019, 09/19/2019   Pfizer Covid-19 Vaccine Bivalent Booster 59yr & up 05/01/2020, 01/20/2021, 10/27/2021   Pneumococcal Conjugate-13 01/29/2014   Pneumococcal Polysaccharide-23 06/07/2007   Td 05/02/2003   Tdap 10/18/2011   Zoster, Live 01/17/2006   Pertinent  Health Maintenance Due  Topic Date Due   FOOT EXAM  Never done   OPHTHALMOLOGY EXAM  Never done   DEXA SCAN  Never done   INFLUENZA VACCINE  02/16/2022   HEMOGLOBIN A1C  04/13/2022      10/14/2021    7:30 PM 10/15/2021    7:42 AM 10/16/2021   12:00 AM 10/16/2021    9:00 AM 12/10/2021    4:03 PM  Fall Risk  Patient Fall Risk Level High fall risk High fall risk High fall risk High fall risk High fall risk     Vitals:   12/28/21 1134  BP: 126/85  Pulse: 80  Resp: (!) 22  Temp: 97.7 F (36.5 C)  SpO2: 91%  Weight: 129 lb 9.6 oz (58.8 kg)  Height: '5\' 2"'$  (1.575 m)   Body mass index is 23.7 kg/m.  Physical Exam Constitutional:      General: She is not in acute distress. HENT:     Head: Normocephalic and atraumatic.     Nose: Nose normal.     Mouth/Throat:     Mouth: Mucous membranes are moist.  Eyes:     Conjunctiva/sclera: Conjunctivae normal.  Cardiovascular:     Rate and Rhythm: Normal rate and regular rhythm.  Pulmonary:     Effort: Pulmonary effort is normal.     Breath sounds: Normal breath sounds.  Abdominal:     General: Bowel sounds are normal.     Palpations: Abdomen is soft.  Musculoskeletal:        General: Normal range of motion.     Cervical back: Normal range of motion.  Skin:    General: Skin is warm and dry.  Neurological:     Mental Status: Mental status is at baseline. She is disoriented.  Psychiatric:        Mood and Affect: Mood normal.        Behavior: Behavior normal.        Labs  reviewed: Recent Labs    10/15/21 0640 10/16/21 0328 10/16/21 0328 10/23/21 0000 11/18/21 0000 12/10/21 1601 12/10/21 1823  NA 133* 137   < > 138 140 137 134*  K 3.1* 4.0  --  5.3*  --  5.3* 5.1  CL 101 111  --  103  --  100 101  CO2 23 21*  --  25*  --  27 27  GLUCOSE 118* 105*  --   --   --  99 112*  BUN 11 15   < > 28* 34* 38* 35*  CREATININE 0.60 0.61   < > 1.0 0.9 0.95 0.91  CALCIUM 8.7* 8.4*  --  9.9  --  9.4 9.0  MG 1.7  --   --   --   --   --   --   PHOS 2.7  --   --   --   --   --   --    < > = values in this interval not displayed.   Recent Labs    10/11/21 0325 10/12/21 0322 10/23/21 0000 12/10/21 1601  AST 49* 39 18 15  ALT '25 24 17 11  '$ ALKPHOS 41 34* 60  42  BILITOT 0.6 0.4  --  0.4  PROT 5.8* 5.1*  --  6.8  ALBUMIN 3.1* 2.7* 3.6 2.9*   Recent Labs    10/10/21 1434 10/12/21 0322 10/13/21 0704 10/16/21 0328 10/23/21 0000 11/18/21 0000 12/10/21 1601  WBC 14.5*   < > 9.4 9.2 7.7 7.1 11.1*  NEUTROABS 12.4*  --   --   --  4,766.00  --  7.3  HGB 14.9   < > 11.6* 12.0 13.0  --  10.4*  HCT 44.4   < > 35.5* 36.7 40  --  32.2*  MCV 94.3   < > 96.7 94.8  --   --  98.2  PLT 341   < > 282 365 450*  --  363   < > = values in this interval not displayed.   No results found for: "TSH" Lab Results  Component Value Date   HGBA1C 6.8 (H) 10/11/2021   No results found for: "CHOL", "HDL", "LDLCALC", "LDLDIRECT", "TRIG", "CHOLHDL"  Significant Diagnostic Results in last 30 days:  CT Chest W Contrast  Result Date: 12/10/2021 CLINICAL DATA:  Respiratory illness, nondiagnostic xray EXAM: CT CHEST WITH CONTRAST TECHNIQUE: Multidetector CT imaging of the chest was performed during intravenous contrast administration. RADIATION DOSE REDUCTION: This exam was performed according to the departmental dose-optimization program which includes automated exposure control, adjustment of the mA and/or kV according to patient size and/or use of iterative reconstruction technique.  CONTRAST:  30m OMNIPAQUE IOHEXOL 300 MG/ML  SOLN COMPARISON:  Same day radiograph, Two-view chest radiograph Nov 26, 2017, CT abdomen pelvis 06/20/2018. FINDINGS: Cardiovascular: Mild cardiomegaly.Coronary artery, mitral annular, and aortic valve calcifications.No pericardial disease. No central PE. Moderate atherosclerosis of the aortic arch and descending thoracic aorta.Delete Mediastinum/Nodes: No lymphadenopathy.There is a 2.1 cm right thyroid cyst, no follow-up recommended.Small hiatal hernia.The trachea is unremarkable. Lungs/Pleura: Airways are patent. There is mild bronchial wall thickening. There is mild interlobular septal thickening and ground-glass opacities bilaterally. There is no focal airspace consolidation. Trace right pleural effusion.No suspicious pulmonary nodules. No pneumothorax. Upper Abdomen: Prior cholecystectomy.  No acute abnormality. Musculoskeletal: There are multiple compression deformities in the thoracic spine, which are chronic, though it note there is a progressive compression deformity of T11 in comparison to prior CT in December 2019. Other thoracic compression deformities and L1 compression deformity appear similar to prior two-view chest radiograph in May 2019. There is a chronic manubrium fracture deformity in of the adjacent right medial clavicle. Reverse right shoulder arthroplasty. Severe left glenohumeral osteoarthritis. Chronic right upper rib injuries. Chronic left mid rib injury. IMPRESSION: Mild pulmonary edema with trace right pleural effusion. Mild cardiomegaly. Multiple chronic compression deformities of the thoracic spine, with progressive height loss at T11. Electronically Signed   By: JMaurine SimmeringM.D.   On: 12/10/2021 18:35   DG Chest Port 1 View  Result Date: 12/10/2021 CLINICAL DATA:  Provided history: Questionable sepsis-evaluate for abnormality. EXAM: PORTABLE CHEST 1 VIEW COMPARISON:  Same day chest radiographs 12/10/2021. Additional prior chest  radiographs 11/26/2017 and earlier. FINDINGS: The patient is significantly rotated to the right, limiting overall evaluation and limiting evaluation of heart size. Ill-defined opacities within the perihilar right lung and left lung base. No evidence of pleural effusion or pneumothorax. No acute bony abnormality identified. Prior reverse right shoulder arthroplasty, incompletely imaged. IMPRESSION: Significant patient rotation to the right, limiting evaluation. Ill-defined opacities within the perihilar right lung and left lung base. Findings may reflect atelectasis. However, pneumonia/aspiration cannot be excluded. Electronically  Signed   By: Kellie Simmering D.O.   On: 12/10/2021 16:51   CT Head Wo Contrast  Result Date: 12/10/2021 CLINICAL DATA:  Mental status change, unknown cause EXAM: CT HEAD WITHOUT CONTRAST TECHNIQUE: Contiguous axial images were obtained from the base of the skull through the vertex without intravenous contrast. RADIATION DOSE REDUCTION: This exam was performed according to the departmental dose-optimization program which includes automated exposure control, adjustment of the mA and/or kV according to patient size and/or use of iterative reconstruction technique. COMPARISON:  CT head October 10, 2021 FINDINGS: Brain: No evidence of acute infarction, hemorrhage, hydrocephalus, extra-axial collection or mass lesion/mass effect. Patchy white matter hypoattenuation, nonspecific but compatible with chronic microvascular disease. Cerebral atrophy. Vascular: Calcific intracranial atherosclerosis. Skull: No acute fracture.  Hyperostosis frontalis. Sinuses/Orbits: Clear sinuses.  No acute orbital findings. Other: No mastoid effusions. IMPRESSION: 1. No evidence of acute intracranial abnormality. 2. Chronic microvascular ischemic disease and cerebral atrophy (ICD10-G31.9). Electronically Signed   By: Margaretha Sheffield M.D.   On: 12/10/2021 16:50    Assessment/Plan  1. Seasonal allergies -  will  start Claritin 10 mg PO daily PRN  2. Dysphagia, unspecified type -  continue nectar thick liquids -   aspiration precautions    Family/ staff Communication: Discussed plan of care with  charge nurse.  Labs/tests ordered:   None    Durenda Age, DNP, MSN, FNP-BC [facility] [phone] (Monday-Friday 8:00 a.m. - 5:00 p.m.) [phone] (after hours)

## 2021-12-29 DIAGNOSIS — I10 Essential (primary) hypertension: Secondary | ICD-10-CM | POA: Diagnosis not present

## 2021-12-29 DIAGNOSIS — R1312 Dysphagia, oropharyngeal phase: Secondary | ICD-10-CM | POA: Diagnosis not present

## 2021-12-29 DIAGNOSIS — R1310 Dysphagia, unspecified: Secondary | ICD-10-CM | POA: Diagnosis not present

## 2021-12-29 DIAGNOSIS — R29898 Other symptoms and signs involving the musculoskeletal system: Secondary | ICD-10-CM | POA: Diagnosis not present

## 2021-12-29 DIAGNOSIS — M6281 Muscle weakness (generalized): Secondary | ICD-10-CM | POA: Diagnosis not present

## 2021-12-29 DIAGNOSIS — F03C0 Unspecified dementia, severe, without behavioral disturbance, psychotic disturbance, mood disturbance, and anxiety: Secondary | ICD-10-CM | POA: Diagnosis not present

## 2021-12-29 DIAGNOSIS — R41841 Cognitive communication deficit: Secondary | ICD-10-CM | POA: Diagnosis not present

## 2021-12-29 LAB — CBC AND DIFFERENTIAL
HCT: 37 (ref 36–46)
Hemoglobin: 12.1 (ref 12.0–16.0)
Neutrophils Absolute: 3584
Platelets: 335 K/uL (ref 150–400)
WBC: 8

## 2021-12-29 LAB — HEPATIC FUNCTION PANEL
ALT: 12 U/L (ref 7–35)
AST: 18 (ref 13–35)
Alkaline Phosphatase: 56 (ref 25–125)
Bilirubin, Total: 0.4

## 2021-12-29 LAB — BASIC METABOLIC PANEL WITH GFR
BUN: 30 — AB (ref 4–21)
CO2: 30 — AB (ref 13–22)
Chloride: 99 (ref 99–108)
Creatinine: 1 (ref 0.5–1.1)
Glucose: 96
Potassium: 4.9 meq/L (ref 3.5–5.1)
Sodium: 135 — AB (ref 137–147)

## 2021-12-29 LAB — COMPREHENSIVE METABOLIC PANEL
Albumin: 3.7 (ref 3.5–5.0)
Calcium: 9.8 (ref 8.7–10.7)
Globulin: 3

## 2021-12-29 LAB — CBC: RBC: 3.8 — AB (ref 3.87–5.11)

## 2021-12-30 DIAGNOSIS — R1312 Dysphagia, oropharyngeal phase: Secondary | ICD-10-CM | POA: Diagnosis not present

## 2021-12-30 DIAGNOSIS — R41841 Cognitive communication deficit: Secondary | ICD-10-CM | POA: Diagnosis not present

## 2021-12-30 DIAGNOSIS — F03C0 Unspecified dementia, severe, without behavioral disturbance, psychotic disturbance, mood disturbance, and anxiety: Secondary | ICD-10-CM | POA: Diagnosis not present

## 2021-12-30 DIAGNOSIS — R29898 Other symptoms and signs involving the musculoskeletal system: Secondary | ICD-10-CM | POA: Diagnosis not present

## 2021-12-30 DIAGNOSIS — M6281 Muscle weakness (generalized): Secondary | ICD-10-CM | POA: Diagnosis not present

## 2021-12-30 DIAGNOSIS — R1310 Dysphagia, unspecified: Secondary | ICD-10-CM | POA: Diagnosis not present

## 2021-12-31 DIAGNOSIS — M6281 Muscle weakness (generalized): Secondary | ICD-10-CM | POA: Diagnosis not present

## 2021-12-31 DIAGNOSIS — R29898 Other symptoms and signs involving the musculoskeletal system: Secondary | ICD-10-CM | POA: Diagnosis not present

## 2021-12-31 DIAGNOSIS — R1310 Dysphagia, unspecified: Secondary | ICD-10-CM | POA: Diagnosis not present

## 2021-12-31 DIAGNOSIS — R41841 Cognitive communication deficit: Secondary | ICD-10-CM | POA: Diagnosis not present

## 2021-12-31 DIAGNOSIS — R1312 Dysphagia, oropharyngeal phase: Secondary | ICD-10-CM | POA: Diagnosis not present

## 2021-12-31 DIAGNOSIS — F03C0 Unspecified dementia, severe, without behavioral disturbance, psychotic disturbance, mood disturbance, and anxiety: Secondary | ICD-10-CM | POA: Diagnosis not present

## 2022-01-01 DIAGNOSIS — F03C0 Unspecified dementia, severe, without behavioral disturbance, psychotic disturbance, mood disturbance, and anxiety: Secondary | ICD-10-CM | POA: Diagnosis not present

## 2022-01-01 DIAGNOSIS — R41841 Cognitive communication deficit: Secondary | ICD-10-CM | POA: Diagnosis not present

## 2022-01-01 DIAGNOSIS — R29898 Other symptoms and signs involving the musculoskeletal system: Secondary | ICD-10-CM | POA: Diagnosis not present

## 2022-01-01 DIAGNOSIS — M6281 Muscle weakness (generalized): Secondary | ICD-10-CM | POA: Diagnosis not present

## 2022-01-01 DIAGNOSIS — R1312 Dysphagia, oropharyngeal phase: Secondary | ICD-10-CM | POA: Diagnosis not present

## 2022-01-01 DIAGNOSIS — R1310 Dysphagia, unspecified: Secondary | ICD-10-CM | POA: Diagnosis not present

## 2022-01-04 DIAGNOSIS — R1310 Dysphagia, unspecified: Secondary | ICD-10-CM | POA: Diagnosis not present

## 2022-01-04 DIAGNOSIS — F03C0 Unspecified dementia, severe, without behavioral disturbance, psychotic disturbance, mood disturbance, and anxiety: Secondary | ICD-10-CM | POA: Diagnosis not present

## 2022-01-04 DIAGNOSIS — M6281 Muscle weakness (generalized): Secondary | ICD-10-CM | POA: Diagnosis not present

## 2022-01-04 DIAGNOSIS — R41841 Cognitive communication deficit: Secondary | ICD-10-CM | POA: Diagnosis not present

## 2022-01-04 DIAGNOSIS — R29898 Other symptoms and signs involving the musculoskeletal system: Secondary | ICD-10-CM | POA: Diagnosis not present

## 2022-01-04 DIAGNOSIS — R1312 Dysphagia, oropharyngeal phase: Secondary | ICD-10-CM | POA: Diagnosis not present

## 2022-01-05 DIAGNOSIS — F411 Generalized anxiety disorder: Secondary | ICD-10-CM | POA: Diagnosis not present

## 2022-01-05 DIAGNOSIS — R29898 Other symptoms and signs involving the musculoskeletal system: Secondary | ICD-10-CM | POA: Diagnosis not present

## 2022-01-05 DIAGNOSIS — F03C0 Unspecified dementia, severe, without behavioral disturbance, psychotic disturbance, mood disturbance, and anxiety: Secondary | ICD-10-CM | POA: Diagnosis not present

## 2022-01-05 DIAGNOSIS — F01B2 Vascular dementia, moderate, with psychotic disturbance: Secondary | ICD-10-CM | POA: Diagnosis not present

## 2022-01-05 DIAGNOSIS — R41841 Cognitive communication deficit: Secondary | ICD-10-CM | POA: Diagnosis not present

## 2022-01-05 DIAGNOSIS — M6281 Muscle weakness (generalized): Secondary | ICD-10-CM | POA: Diagnosis not present

## 2022-01-05 DIAGNOSIS — R1310 Dysphagia, unspecified: Secondary | ICD-10-CM | POA: Diagnosis not present

## 2022-01-05 DIAGNOSIS — F331 Major depressive disorder, recurrent, moderate: Secondary | ICD-10-CM | POA: Diagnosis not present

## 2022-01-05 DIAGNOSIS — R1312 Dysphagia, oropharyngeal phase: Secondary | ICD-10-CM | POA: Diagnosis not present

## 2022-01-07 DIAGNOSIS — R41841 Cognitive communication deficit: Secondary | ICD-10-CM | POA: Diagnosis not present

## 2022-01-07 DIAGNOSIS — F03C0 Unspecified dementia, severe, without behavioral disturbance, psychotic disturbance, mood disturbance, and anxiety: Secondary | ICD-10-CM | POA: Diagnosis not present

## 2022-01-07 DIAGNOSIS — R29898 Other symptoms and signs involving the musculoskeletal system: Secondary | ICD-10-CM | POA: Diagnosis not present

## 2022-01-07 DIAGNOSIS — R1310 Dysphagia, unspecified: Secondary | ICD-10-CM | POA: Diagnosis not present

## 2022-01-07 DIAGNOSIS — M6281 Muscle weakness (generalized): Secondary | ICD-10-CM | POA: Diagnosis not present

## 2022-01-07 DIAGNOSIS — R1312 Dysphagia, oropharyngeal phase: Secondary | ICD-10-CM | POA: Diagnosis not present

## 2022-01-08 ENCOUNTER — Non-Acute Institutional Stay (SKILLED_NURSING_FACILITY): Payer: Medicare Other | Admitting: Adult Health

## 2022-01-08 ENCOUNTER — Encounter: Payer: Self-pay | Admitting: Adult Health

## 2022-01-08 DIAGNOSIS — F418 Other specified anxiety disorders: Secondary | ICD-10-CM

## 2022-01-08 DIAGNOSIS — G629 Polyneuropathy, unspecified: Secondary | ICD-10-CM

## 2022-01-08 DIAGNOSIS — F03C11 Unspecified dementia, severe, with agitation: Secondary | ICD-10-CM

## 2022-01-08 DIAGNOSIS — F39 Unspecified mood [affective] disorder: Secondary | ICD-10-CM

## 2022-01-08 DIAGNOSIS — R1310 Dysphagia, unspecified: Secondary | ICD-10-CM | POA: Diagnosis not present

## 2022-01-08 DIAGNOSIS — I10 Essential (primary) hypertension: Secondary | ICD-10-CM

## 2022-01-08 DIAGNOSIS — F03C0 Unspecified dementia, severe, without behavioral disturbance, psychotic disturbance, mood disturbance, and anxiety: Secondary | ICD-10-CM | POA: Diagnosis not present

## 2022-01-08 DIAGNOSIS — M6281 Muscle weakness (generalized): Secondary | ICD-10-CM | POA: Diagnosis not present

## 2022-01-08 DIAGNOSIS — R1312 Dysphagia, oropharyngeal phase: Secondary | ICD-10-CM | POA: Diagnosis not present

## 2022-01-08 DIAGNOSIS — R29898 Other symptoms and signs involving the musculoskeletal system: Secondary | ICD-10-CM | POA: Diagnosis not present

## 2022-01-08 DIAGNOSIS — R41841 Cognitive communication deficit: Secondary | ICD-10-CM | POA: Diagnosis not present

## 2022-01-11 DIAGNOSIS — R41841 Cognitive communication deficit: Secondary | ICD-10-CM | POA: Diagnosis not present

## 2022-01-11 DIAGNOSIS — F03C0 Unspecified dementia, severe, without behavioral disturbance, psychotic disturbance, mood disturbance, and anxiety: Secondary | ICD-10-CM | POA: Diagnosis not present

## 2022-01-11 DIAGNOSIS — M6281 Muscle weakness (generalized): Secondary | ICD-10-CM | POA: Diagnosis not present

## 2022-01-11 DIAGNOSIS — R1310 Dysphagia, unspecified: Secondary | ICD-10-CM | POA: Diagnosis not present

## 2022-01-11 DIAGNOSIS — R1312 Dysphagia, oropharyngeal phase: Secondary | ICD-10-CM | POA: Diagnosis not present

## 2022-01-11 DIAGNOSIS — R29898 Other symptoms and signs involving the musculoskeletal system: Secondary | ICD-10-CM | POA: Diagnosis not present

## 2022-01-12 DIAGNOSIS — R41841 Cognitive communication deficit: Secondary | ICD-10-CM | POA: Diagnosis not present

## 2022-01-12 DIAGNOSIS — R1312 Dysphagia, oropharyngeal phase: Secondary | ICD-10-CM | POA: Diagnosis not present

## 2022-01-12 DIAGNOSIS — M6281 Muscle weakness (generalized): Secondary | ICD-10-CM | POA: Diagnosis not present

## 2022-01-12 DIAGNOSIS — R29898 Other symptoms and signs involving the musculoskeletal system: Secondary | ICD-10-CM | POA: Diagnosis not present

## 2022-01-12 DIAGNOSIS — R1310 Dysphagia, unspecified: Secondary | ICD-10-CM | POA: Diagnosis not present

## 2022-01-12 DIAGNOSIS — F03C0 Unspecified dementia, severe, without behavioral disturbance, psychotic disturbance, mood disturbance, and anxiety: Secondary | ICD-10-CM | POA: Diagnosis not present

## 2022-01-14 DIAGNOSIS — R29898 Other symptoms and signs involving the musculoskeletal system: Secondary | ICD-10-CM | POA: Diagnosis not present

## 2022-01-14 DIAGNOSIS — M6281 Muscle weakness (generalized): Secondary | ICD-10-CM | POA: Diagnosis not present

## 2022-01-14 DIAGNOSIS — R1312 Dysphagia, oropharyngeal phase: Secondary | ICD-10-CM | POA: Diagnosis not present

## 2022-01-14 DIAGNOSIS — R41841 Cognitive communication deficit: Secondary | ICD-10-CM | POA: Diagnosis not present

## 2022-01-14 DIAGNOSIS — R1310 Dysphagia, unspecified: Secondary | ICD-10-CM | POA: Diagnosis not present

## 2022-01-14 DIAGNOSIS — F03C0 Unspecified dementia, severe, without behavioral disturbance, psychotic disturbance, mood disturbance, and anxiety: Secondary | ICD-10-CM | POA: Diagnosis not present

## 2022-02-02 LAB — BASIC METABOLIC PANEL
BUN: 40 — AB (ref 4–21)
CO2: 30 — AB (ref 13–22)
Chloride: 101 (ref 99–108)
Creatinine: 1.2 — AB (ref 0.5–1.1)
Glucose: 125
Potassium: 3.6 mEq/L (ref 3.5–5.1)
Sodium: 140 (ref 137–147)

## 2022-02-02 LAB — COMPREHENSIVE METABOLIC PANEL
Calcium: 9.9 (ref 8.7–10.7)
eGFR: 46

## 2022-02-04 ENCOUNTER — Non-Acute Institutional Stay (SKILLED_NURSING_FACILITY): Payer: Medicare Other | Admitting: Internal Medicine

## 2022-02-04 ENCOUNTER — Encounter: Payer: Self-pay | Admitting: Internal Medicine

## 2022-02-04 DIAGNOSIS — F03C11 Unspecified dementia, severe, with agitation: Secondary | ICD-10-CM | POA: Diagnosis not present

## 2022-02-04 DIAGNOSIS — E0800 Diabetes mellitus due to underlying condition with hyperosmolarity without nonketotic hyperglycemic-hyperosmolar coma (NKHHC): Secondary | ICD-10-CM | POA: Diagnosis not present

## 2022-02-04 DIAGNOSIS — F418 Other specified anxiety disorders: Secondary | ICD-10-CM | POA: Diagnosis not present

## 2022-02-04 DIAGNOSIS — H548 Legal blindness, as defined in USA: Secondary | ICD-10-CM | POA: Diagnosis not present

## 2022-02-04 DIAGNOSIS — G629 Polyneuropathy, unspecified: Secondary | ICD-10-CM

## 2022-02-04 DIAGNOSIS — M810 Age-related osteoporosis without current pathological fracture: Secondary | ICD-10-CM | POA: Diagnosis not present

## 2022-02-04 DIAGNOSIS — M25511 Pain in right shoulder: Secondary | ICD-10-CM | POA: Diagnosis not present

## 2022-02-04 DIAGNOSIS — Z96611 Presence of right artificial shoulder joint: Secondary | ICD-10-CM | POA: Diagnosis not present

## 2022-02-04 NOTE — Progress Notes (Signed)
Location:   [facility] Room Number: 35 Place of Service:  SNF [phone]) Provider:  Veleta Miners MD  Virgie Dad, MD  Patient Care Team: Virgie Dad, MD as PCP - General (Internal Medicine)  Extended Emergency Contact Information Primary Emergency Contact: [place] Phone: [phone] Mobile Phone: [phone] Relation: Relative Secondary Emergency Contact: [place] Mobile Phone: [phone] Relation: Grandson Interpreter needed? No  Code Status:  DNR Goals of care: Advanced Directive information    02/04/2022   11:26 AM  Advanced Directives  Does Patient Have a Medical Advance Directive? Yes  Type of Paramedic of [place];Living will;Out of facility DNR (pink MOST or yellow form)  Does patient want to make changes to medical advance directive? No - Patient declined  Copy of [place] in Chart? Yes - validated most recent copy scanned in chart (See row information)  Pre-existing out of facility DNR order (yellow form or pink MOST form) Yellow form placed in chart (order not valid for inpatient use)     Chief Complaint  Patient presents with   Acute Visit    Shoulder pain    HPI:  Pt is a 88 y.o. female seen today for an acute visit for Right Shoulder pain  Lives in SNF in [place]  Patient has h/o Dementia.Hypertension and Diabetes  T 11 Compression fracture and subacute Pelvic fracture  Had acute pain in her Right shoulder Unable to give any history Very hard of hearing Hearing aids Don't Work No Falls pre Nurses Patient does have history of right shoulder arthroplasty and has had pain in the past  Had been on Norco which was stopped few weeks ago No issues with her left shoulder  Past Medical History:  Diagnosis Date   Anxiety    Arthritis    Cancer ([place])    hx of skin cancer removal on hand    Colitis    Diabetes mellitus without complication ([place])     Gallstones    Hyperlipidemia    Hypertension    IBS (irritable bowel syndrome)    Osteoporosis    Past Surgical History:  Procedure Laterality Date   APPENDECTOMY     BACK SURGERY     x2   cataract surgery      bilateral    CHOLECYSTECTOMY  1999   EYE SURGERY     for glaucoma    left eye peeling surgery      left femur bone surgery      1978   REVERSE SHOULDER ARTHROPLASTY Right 10/10/2014   Procedure: RIGHT REVERSE SHOULDER ARTHROPLASTY;  Surgeon: Justice Britain, MD;  Location: [facility];  Service: Orthopedics;  Laterality: Right;   ROTATOR CUFF REPAIR     TOTAL HIP ARTHROPLASTY Left 02/20/2014   Procedure: LEFT TOTAL HIP ARTHROPLASTY ANTERIOR APPROACH;  Surgeon: Gearlean Alf, MD;  Location: [facility];  Service: Orthopedics;  Laterality: Left;   WRIST SURGERY     left     Allergies  Allergen Reactions   Robaxin [Methocarbamol] Rash and Other (See Comments)    Red rash on right side of face and forehead cleared after benadryl given, per [name], RN   Ancef [Cefazolin] Diarrhea   Dilaudid [Hydromorphone] Other (See Comments)    Stomach upset   Fosamax [Alendronate] Other (See Comments)    Upset stomach   Hyoscyamine Other (See Comments)    "Caused gas"   Morphine And Related Itching   Nsaids Diarrhea  Parathyroid Hormone (Recomb) Other (See Comments)    was not able to give herself the shot   Penicillins Itching    Tolerated Unasyn Has patient had a PCN reaction causing immediate rash, facial/tongue/throat swelling, SOB or lightheadedness with hypotension: Yes Has patient had a PCN reaction causing severe rash involving mucus membranes or skin necrosis: No Has patient had a PCN reaction that required hospitalization: Was in hospital Has patient had a PCN reaction occurring within the last 10 years: No If all of the above answers are "NO", then may proceed with Cephalosporin use.    Tape Other (See Comments)    Bandages and leads cause bruising and weeping at sites    Flurbiprofen Diarrhea    Allergies as of 02/04/2022       Reactions   Robaxin [methocarbamol] Rash, Other (See Comments)   Red rash on right side of face and forehead cleared after benadryl given, per [name], RN   Ancef [cefazolin] Diarrhea   Dilaudid [hydromorphone] Other (See Comments)   Stomach upset   Fosamax [alendronate] Other (See Comments)   Upset stomach   Hyoscyamine Other (See Comments)   "Caused gas"   Morphine And Related Itching   Nsaids Diarrhea   Parathyroid Hormone (recomb) Other (See Comments)   was not able to give herself the shot   Penicillins Itching   Tolerated Unasyn Has patient had a PCN reaction causing immediate rash, facial/tongue/throat swelling, SOB or lightheadedness with hypotension: Yes Has patient had a PCN reaction causing severe rash involving mucus membranes or skin necrosis: No Has patient had a PCN reaction that required hospitalization: Was in hospital Has patient had a PCN reaction occurring within the last 10 years: No If all of the above answers are "NO", then may proceed with Cephalosporin use.   Tape Other (See Comments)   Bandages and leads cause bruising and weeping at sites   Flurbiprofen Diarrhea        Medication List        Accurate as of February 04, 2022 11:28 AM. If you have any questions, ask your nurse or doctor.          STOP taking these medications    LORazepam 0.5 MG tablet Commonly known as: ATIVAN Stopped by: Virgie Dad, MD       TAKE these medications    acetaminophen 500 MG tablet Commonly known as: TYLENOL Take 2 tablets (1,000 mg total) by mouth 3 (three) times daily as needed.   aspirin EC 81 MG tablet Take 81 mg by mouth daily.   calcium carbonate 1500 (600 Ca) MG Tabs tablet Commonly known as: OSCAL Take 600 mg of elemental calcium by mouth daily.   Cholecalciferol 25 MCG (1000 UT) tablet Take 1,000 Units by mouth daily.   denosumab 60 MG/ML Sosy injection Commonly known as:  PROLIA Inject 60 mg into the skin every 6 (six) months. Last injection 06/02/21   divalproex 125 MG capsule Commonly known as: DEPAKOTE SPRINKLE Take 125 mg by mouth 2 (two) times daily.   DSS 100 MG Caps Take 100 mg by mouth 2 (two) times daily.   gabapentin 100 MG capsule Commonly known as: NEURONTIN Take 200 mg by mouth 3 (three) times daily.   lactose free nutrition Liqd Take 237 mLs by mouth 2 (two) times daily between meals.   loratadine 10 MG tablet Commonly known as: CLARITIN Take 10 mg by mouth daily as needed for allergies.   losartan 50 MG tablet Commonly known  as: COZAAR Take 1 tablet (50 mg total) by mouth daily.   MELATONIN PO Take 3 mg by mouth at bedtime as needed (sleep).   mirtazapine 15 MG tablet Commonly known as: REMERON Take 15 mg by mouth at bedtime.   PreserVision AREDS 2 Caps Take 1 capsule by mouth daily.   QUEtiapine 25 MG tablet Commonly known as: SEROQUEL Take 12.5 mg by mouth at bedtime.   QUEtiapine 25 MG tablet Commonly known as: SEROQUEL Take 12.5 mg by mouth See admin instructions. Give 1/2 tablet by mouth daily at 1pm        Review of Systems  Unable to perform ROS: Dementia    Immunization History  Administered Date(s) Administered   Influenza Split 07/04/2009, 04/09/2010, 05/06/2011, 03/29/2012, 05/03/2013   Influenza, High Dose Seasonal PF 05/02/2013, 05/25/2017, 04/26/2018, 04/24/2019   Influenza, Quadrivalent, Recombinant, Inj, Pf 04/26/2020   Influenza,inj,Quad PF,6+ Mos 05/26/2021   Influenza,inj,quad, With Preservative 04/18/2017   Influenza-Unspecified 05/12/2015   PFIZER(Purple Top)SARS-COV-2 Vaccination 08/25/2019, 09/19/2019   Pfizer Covid-19 Vaccine Bivalent Booster 4yr & up 05/01/2020, 01/20/2021, 10/27/2021   Pneumococcal Conjugate-13 01/29/2014   Pneumococcal Polysaccharide-23 06/07/2007   Td 05/02/2003   Tdap 10/18/2011   Zoster, Live 01/17/2006   Pertinent  Health Maintenance Due  Topic Date Due    FOOT EXAM  Never done   OPHTHALMOLOGY EXAM  Never done   DEXA SCAN  Never done   INFLUENZA VACCINE  02/16/2022   HEMOGLOBIN A1C  04/13/2022      10/14/2021    7:30 PM 10/15/2021    7:42 AM 10/16/2021   12:00 AM 10/16/2021    9:00 AM 12/10/2021    4:03 PM  Fall Risk  Patient Fall Risk Level High fall risk High fall risk High fall risk High fall risk High fall risk   Functional Status Survey:    Vitals:   02/04/22 1039  BP: 113/83  Pulse: 81  Resp: 18  Temp: (!) 97.1 F (36.2 C)  SpO2: 91%  Weight: 137 lb (62.1 kg)  Height: '5\' 2"'$  (1.575 m)   Body mass index is 25.06 kg/m. Physical Exam Vitals reviewed.  Constitutional:      Appearance: Normal appearance.  HENT:     Head: Normocephalic.     Nose: Nose normal.     Mouth/Throat:     Mouth: Mucous membranes are moist.     Pharynx: Oropharynx is clear.  Eyes:     Pupils: Pupils are equal, round, and reactive to light.  Cardiovascular:     Rate and Rhythm: Normal rate and regular rhythm.     Pulses: Normal pulses.     Heart sounds: Normal heart sounds. No murmur heard. Pulmonary:     Effort: Pulmonary effort is normal.     Breath sounds: Normal breath sounds.  Abdominal:     General: Abdomen is flat. Bowel sounds are normal.     Palpations: Abdomen is soft.  Musculoskeletal:        General: No swelling.     Cervical back: Neck supple.     Comments: Right Shoulder Would not let me move without Pain No Swelling or Bruises noted Can not raise it without Pain  Skin:    General: Skin is warm.  Neurological:     General: No focal deficit present.     Mental Status: She is alert.  Psychiatric:        Mood and Affect: Mood normal.        Thought Content:  Thought content normal.     Labs reviewed: Recent Labs    10/15/21 0640 10/16/21 0328 10/23/21 0000 12/10/21 1601 12/10/21 1823 12/29/21 0000  NA 133* 137   < > 137 134* 135*  K 3.1* 4.0   < > 5.3* 5.1 4.9  CL 101 111   < > 100 101 99  CO2 23 21*   <  > 27 27 30*  GLUCOSE 118* 105*  --  99 112*  --   BUN 11 15   < > 38* 35* 30*  CREATININE 0.60 0.61   < > 0.95 0.91 1.0  CALCIUM 8.7* 8.4*   < > 9.4 9.0 9.8  MG 1.7  --   --   --   --   --   PHOS 2.7  --   --   --   --   --    < > = values in this interval not displayed.   Recent Labs    10/11/21 0325 10/12/21 0322 10/23/21 0000 12/10/21 1601 12/29/21 0000  AST 49* 39 '18 15 18  '$ ALT '25 24 17 11 12  '$ ALKPHOS 41 34* 60 42 56  BILITOT 0.6 0.4  --  0.4  --   PROT 5.8* 5.1*  --  6.8  --   ALBUMIN 3.1* 2.7* 3.6 2.9* 3.7   Recent Labs    10/13/21 0704 10/16/21 0328 10/16/21 0328 10/23/21 0000 11/18/21 0000 12/10/21 1601 12/29/21 0000  WBC 9.4 9.2   < > 7.7 7.1 11.1* 8.0  NEUTROABS  --   --   --  4,766.00  --  7.3 3,584.00  HGB 11.6* 12.0  --  13.0  --  10.4* 12.1  HCT 35.5* 36.7  --  40  --  32.2* 37  MCV 96.7 94.8  --   --   --  98.2  --   PLT 282 365  --  450*  --  363 335   < > = values in this interval not displayed.   No results found for: "TSH" Lab Results  Component Value Date   HGBA1C 6.8 (H) 10/11/2021   No results found for: "CHOL", "HDL", "LDLCALC", "LDLDIRECT", "TRIG", "CHOLHDL"  Significant Diagnostic Results in last 30 days:  No results found.  Assessment/Plan 1. Acute pain of right shoulder/ H/o Arthroplasty  Will xray  Tramadol 25 mg q8 prn Also schedule Tylenol  2. Severe dementia with agitation, unspecified dementia type (HCC) Taper Seroquel  Discontinue Afternoon dose and just do evening dose  3. Depression with anxiety On Remeron  4. Neuropathy High dose of Gabapentin  5. Age related osteoporosis, unspecified pathological fracture presence On Prolia  6. Diabetes mellitus due to underlying condition with hyperosmolarity without coma, without long-term current use of insulin (HCC) No Meds  7. Legally blind in right eye, as defined in [place]     Family/ staff Communication:   Labs/tests ordered:

## 2022-02-05 ENCOUNTER — Encounter: Payer: Self-pay | Admitting: *Deleted

## 2022-02-05 MED ORDER — TRAMADOL HCL 50 MG PO TABS: 25.0000 mg | ORAL_TABLET | Freq: Three times a day (TID) | ORAL | 0 refills | Status: AC | PRN

## 2022-02-05 NOTE — Progress Notes (Signed)
This encounter was created in error - please disregard.

## 2022-02-15 ENCOUNTER — Non-Acute Institutional Stay (SKILLED_NURSING_FACILITY): Payer: Medicare Other | Admitting: Nurse Practitioner

## 2022-02-15 ENCOUNTER — Encounter: Payer: Self-pay | Admitting: Nurse Practitioner

## 2022-02-15 DIAGNOSIS — I482 Chronic atrial fibrillation, unspecified: Secondary | ICD-10-CM

## 2022-02-15 DIAGNOSIS — R269 Unspecified abnormalities of gait and mobility: Secondary | ICD-10-CM | POA: Diagnosis not present

## 2022-02-15 DIAGNOSIS — M159 Polyosteoarthritis, unspecified: Secondary | ICD-10-CM | POA: Diagnosis not present

## 2022-02-15 DIAGNOSIS — M810 Age-related osteoporosis without current pathological fracture: Secondary | ICD-10-CM | POA: Diagnosis not present

## 2022-02-15 DIAGNOSIS — I10 Essential (primary) hypertension: Secondary | ICD-10-CM

## 2022-02-15 DIAGNOSIS — E0800 Diabetes mellitus due to underlying condition with hyperosmolarity without nonketotic hyperglycemic-hyperosmolar coma (NKHHC): Secondary | ICD-10-CM

## 2022-02-15 DIAGNOSIS — F0394 Unspecified dementia, unspecified severity, with anxiety: Secondary | ICD-10-CM | POA: Diagnosis not present

## 2022-02-15 DIAGNOSIS — W19XXXA Unspecified fall, initial encounter: Secondary | ICD-10-CM | POA: Diagnosis not present

## 2022-02-15 DIAGNOSIS — F01B2 Vascular dementia, moderate, with psychotic disturbance: Secondary | ICD-10-CM | POA: Diagnosis not present

## 2022-02-15 DIAGNOSIS — F411 Generalized anxiety disorder: Secondary | ICD-10-CM | POA: Diagnosis not present

## 2022-02-15 DIAGNOSIS — F331 Major depressive disorder, recurrent, moderate: Secondary | ICD-10-CM | POA: Diagnosis not present

## 2022-02-15 DIAGNOSIS — F03C11 Unspecified dementia, severe, with agitation: Secondary | ICD-10-CM

## 2022-02-15 NOTE — Progress Notes (Signed)
Location:   [facility] Room Number: 35A Place of Service:  SNF (31) Provider:  Tison Leibold X, NP  Virgie Dad, MD  Patient Care Team: Virgie Dad, MD as PCP - General (Internal Medicine)  Extended Emergency Contact Information Primary Emergency Contact: Kalida Phone: [phone] Mobile Phone: [phone] Relation: Relative Secondary Emergency Contact: Selz Mobile Phone: [phone] Relation: Grandson Interpreter needed? No  Code Status:  DNR Goals of care: Advanced Directive information    02/15/2022   12:03 PM  Advanced Directives  Does Patient Have a Medical Advance Directive? Yes  Type of Paramedic of [place];Living will;Out of facility DNR (pink MOST or yellow form)  Does patient want to make changes to medical advance directive? No - Patient declined  Copy of [place] in Chart? Yes - validated most recent copy scanned in chart (See row information)  Pre-existing out of facility DNR order (yellow form or pink MOST form) Yellow form placed in chart (order not valid for inpatient use)     Chief Complaint  Patient presents with  . Medical Management of Chronic Issues    Routine follow up  . Quality Metric Gaps    Foot exam,eye exam, shingrix vaccine, dexa scan and tetanus/tdap due    HPI:  Pt is a 88 y.o. female seen today for medical management of chronic diseases.    Fell in her room this morning, c/o left hip region pain, able to bear weight/walking, no deformity or bruise noted.  Healed subacute pelvic-right superior pubic ramus fx, T12, L1 Prn Norco, Gabapentin for pain.             Dysphagia III diet             Dementia, intermittent agitation             Chronic Afib, heart rate is in control, takes ASA '81mg'$  qd, Hgb 12.1 12/29/21             HTN, takes Cozaar, Bun/creat 30/1.0 12/29/21             T2DM, on diet, Hgb a1c 6.8 10/11/21             OP, takes  Prolia, Ca, Vit D             Depression/Anxiety, taking Quetiapine, Mirtazapine, Depakote      Past Medical History:  Diagnosis Date  . Anxiety   . Arthritis   . Cancer ([place])    hx of skin cancer removal on hand   . Colitis   . Diabetes mellitus without complication ([place])   . Gallstones   . Hyperlipidemia   . Hypertension   . IBS (irritable bowel syndrome)   . Osteoporosis    Past Surgical History:  Procedure Laterality Date  . APPENDECTOMY    . BACK SURGERY     x2  . cataract surgery      bilateral   . CHOLECYSTECTOMY  1999  . EYE SURGERY     for glaucoma   . left eye peeling surgery     . left femur bone surgery      1978  . REVERSE SHOULDER ARTHROPLASTY Right 10/10/2014   Procedure: RIGHT REVERSE SHOULDER ARTHROPLASTY;  Surgeon: Justice Britain, MD;  Location: [facility];  Service: Orthopedics;  Laterality: Right;  . ROTATOR CUFF REPAIR    . TOTAL HIP ARTHROPLASTY Left 02/20/2014   Procedure: LEFT TOTAL HIP ARTHROPLASTY ANTERIOR APPROACH;  Surgeon: Gearlean Alf, MD;  Location: [facility];  Service: Orthopedics;  Laterality: Left;  . WRIST SURGERY     left     Allergies  Allergen Reactions  . Robaxin [Methocarbamol] Rash and Other (See Comments)    Red rash on right side of face and forehead cleared after benadryl given, per [name], RN  . Ancef [Cefazolin] Diarrhea  . Dilaudid [Hydromorphone] Other (See Comments)    Stomach upset  . Fosamax [Alendronate] Other (See Comments)    Upset stomach  . Hyoscyamine Other (See Comments)    "Caused gas"  . Morphine And Related Itching  . Nsaids Diarrhea  . Parathyroid Hormone (Recomb) Other (See Comments)    was not able to give herself the shot  . Penicillins Itching    Tolerated Unasyn Has patient had a PCN reaction causing immediate rash, facial/tongue/throat swelling, SOB or lightheadedness with hypotension: Yes Has patient had a PCN reaction causing severe rash involving mucus membranes or skin necrosis: No Has patient had  a PCN reaction that required hospitalization: Was in hospital Has patient had a PCN reaction occurring within the last 10 years: No If all of the above answers are "NO", then may proceed with Cephalosporin use.   . Tape Other (See Comments)    Bandages and leads cause bruising and weeping at sites  . Flurbiprofen Diarrhea    Allergies as of 02/15/2022       Reactions   Robaxin [methocarbamol] Rash, Other (See Comments)   Red rash on right side of face and forehead cleared after benadryl given, per [name], RN   Ancef [cefazolin] Diarrhea   Dilaudid [hydromorphone] Other (See Comments)   Stomach upset   Fosamax [alendronate] Other (See Comments)   Upset stomach   Hyoscyamine Other (See Comments)   "Caused gas"   Morphine And Related Itching   Nsaids Diarrhea   Parathyroid Hormone (recomb) Other (See Comments)   was not able to give herself the shot   Penicillins Itching   Tolerated Unasyn Has patient had a PCN reaction causing immediate rash, facial/tongue/throat swelling, SOB or lightheadedness with hypotension: Yes Has patient had a PCN reaction causing severe rash involving mucus membranes or skin necrosis: No Has patient had a PCN reaction that required hospitalization: Was in hospital Has patient had a PCN reaction occurring within the last 10 years: No If all of the above answers are "NO", then may proceed with Cephalosporin use.   Tape Other (See Comments)   Bandages and leads cause bruising and weeping at sites   Flurbiprofen Diarrhea        Medication List        Accurate as of February 15, 2022 11:59 PM. If you have any questions, ask your nurse or doctor.          acetaminophen 500 MG tablet Commonly known as: TYLENOL Take 2 tablets (1,000 mg total) by mouth 3 (three) times daily as needed.   aspirin EC 81 MG tablet Take 81 mg by mouth daily.   calcium carbonate 1500 (600 Ca) MG Tabs tablet Commonly known as: OSCAL Take 600 mg of elemental calcium by mouth  daily.   Cholecalciferol 25 MCG (1000 UT) tablet Take 1,000 Units by mouth daily.   denosumab 60 MG/ML Sosy injection Commonly known as: PROLIA Inject 60 mg into the skin every 6 (six) months. Last injection 06/02/21   divalproex 125 MG capsule Commonly known as: DEPAKOTE SPRINKLE Take 125 mg by mouth 2 (two) times daily.  DSS 100 MG Caps Take 100 mg by mouth 2 (two) times daily.   gabapentin 100 MG capsule Commonly known as: NEURONTIN Take 200 mg by mouth 3 (three) times daily.   lactose free nutrition Liqd Take 237 mLs by mouth 2 (two) times daily between meals.   loratadine 10 MG tablet Commonly known as: CLARITIN Take 10 mg by mouth daily as needed for allergies.   losartan 50 MG tablet Commonly known as: COZAAR Take 1 tablet (50 mg total) by mouth daily.   MELATONIN PO Take 3 mg by mouth at bedtime as needed (sleep).   mirtazapine 15 MG tablet Commonly known as: REMERON Take 15 mg by mouth at bedtime.   PreserVision AREDS 2 Caps Take 1 capsule by mouth daily.   QUEtiapine 25 MG tablet Commonly known as: SEROQUEL Take 12.5 mg by mouth at bedtime.   traMADol 50 MG tablet Commonly known as: ULTRAM Take 0.5 tablets (25 mg total) by mouth every 8 (eight) hours as needed.        Review of Systems  Constitutional:  Negative for appetite change, fatigue and fever.  HENT:  Positive for hearing loss and trouble swallowing. Negative for congestion.   Eyes:  Positive for visual disturbance.       Blind right eye.   Respiratory:  Negative for cough and shortness of breath.   Cardiovascular:  Negative for leg swelling.  Gastrointestinal:  Negative for abdominal pain and constipation.  Genitourinary:  Positive for frequency. Negative for dysuria and urgency.  Musculoskeletal:  Positive for arthralgias, back pain and gait problem.       Left hip region pain, able to bear weight.   Skin:  Negative for color change.  Neurological:  Negative for speech difficulty,  weakness and headaches.       Memory difficulty.   Psychiatric/Behavioral:  Positive for behavioral problems, confusion and sleep disturbance. The patient is nervous/anxious.     Immunization History  Administered Date(s) Administered  . Influenza Split 07/04/2009, 04/09/2010, 05/06/2011, 03/29/2012, 05/03/2013  . Influenza, High Dose Seasonal PF 05/02/2013, 05/25/2017, 04/26/2018, 04/24/2019  . Influenza, Quadrivalent, Recombinant, Inj, Pf 04/26/2020  . Influenza,inj,Quad PF,6+ Mos 05/26/2021  . Influenza,inj,quad, With Preservative 04/18/2017  . Influenza-Unspecified 05/12/2015  . PFIZER(Purple Top)SARS-COV-2 Vaccination 08/25/2019, 09/19/2019  . Pension scheme manager 10yr & up 05/01/2020, 01/20/2021, 10/27/2021  . Pneumococcal Conjugate-13 01/29/2014  . Pneumococcal Polysaccharide-23 06/07/2007  . Td 05/02/2003  . Tdap 10/18/2011  . Zoster, Live 01/17/2006   Pertinent  Health Maintenance Due  Topic Date Due  . FOOT EXAM  Never done  . OPHTHALMOLOGY EXAM  Never done  . DEXA SCAN  Never done  . INFLUENZA VACCINE  02/16/2022  . HEMOGLOBIN A1C  04/13/2022      10/14/2021    7:30 PM 10/15/2021    7:42 AM 10/16/2021   12:00 AM 10/16/2021    9:00 AM 12/10/2021    4:03 PM  Fall Risk  Patient Fall Risk Level High fall risk High fall risk High fall risk High fall risk High fall risk   Functional Status Survey:    Vitals:   02/15/22 1158  BP: 103/70  Pulse: 74  Resp: 18  Temp: 97.9 F (36.6 C)  SpO2: 91%  Weight: 137 lb (62.1 kg)  Height: '5\' 2"'$  (1.575 m)   Body mass index is 25.06 kg/m. Physical Exam Nursing note reviewed.  Constitutional:      Appearance: Normal appearance.  HENT:     Head:  Normocephalic and atraumatic.     Nose: Nose normal.     Mouth/Throat:     Mouth: Mucous membranes are moist.  Eyes:     Extraocular Movements: Extraocular movements intact.     Conjunctiva/sclera: Conjunctivae normal.     Pupils: Pupils are equal, round,  and reactive to light.  Cardiovascular:     Rate and Rhythm: Normal rate and regular rhythm.     Heart sounds: No murmur heard. Pulmonary:     Effort: Pulmonary effort is normal.     Breath sounds: No wheezing or rales.  Abdominal:     General: Bowel sounds are normal.     Palpations: Abdomen is soft.     Tenderness: There is no abdominal tenderness.  Musculoskeletal:        General: Tenderness present.     Cervical back: Normal range of motion and neck supple.     Right lower leg: No edema.     Left lower leg: No edema.     Comments: Left hip region pain after fall this morning, able to bear weight and walk.   Skin:    General: Skin is warm and dry.  Neurological:     General: No focal deficit present.     Mental Status: She is alert. Mental status is at baseline.     Motor: No weakness.     Coordination: Coordination normal.     Gait: Gait abnormal.     Comments: Oriented to person  Psychiatric:        Mood and Affect: Mood normal.    Labs reviewed: Recent Labs    10/15/21 0640 10/16/21 0328 10/23/21 0000 12/10/21 1601 12/10/21 1823 12/29/21 0000  NA 133* 137   < > 137 134* 135*  K 3.1* 4.0   < > 5.3* 5.1 4.9  CL 101 111   < > 100 101 99  CO2 23 21*   < > 27 27 30*  GLUCOSE 118* 105*  --  99 112*  --   BUN 11 15   < > 38* 35* 30*  CREATININE 0.60 0.61   < > 0.95 0.91 1.0  CALCIUM 8.7* 8.4*   < > 9.4 9.0 9.8  MG 1.7  --   --   --   --   --   PHOS 2.7  --   --   --   --   --    < > = values in this interval not displayed.   Recent Labs    10/11/21 0325 10/12/21 0322 10/23/21 0000 12/10/21 1601 12/29/21 0000  AST 49* 39 '18 15 18  '$ ALT '25 24 17 11 12  '$ ALKPHOS 41 34* 60 42 56  BILITOT 0.6 0.4  --  0.4  --   PROT 5.8* 5.1*  --  6.8  --   ALBUMIN 3.1* 2.7* 3.6 2.9* 3.7   Recent Labs    10/13/21 0704 10/16/21 0328 10/16/21 0328 10/23/21 0000 11/18/21 0000 12/10/21 1601 12/29/21 0000  WBC 9.4 9.2   < > 7.7 7.1 11.1* 8.0  NEUTROABS  --   --   --   4,766.00  --  7.3 3,584.00  HGB 11.6* 12.0  --  13.0  --  10.4* 12.1  HCT 35.5* 36.7  --  40  --  32.2* 37  MCV 96.7 94.8  --   --   --  98.2  --   PLT 282 365  --  450*  --  363 335   < > = values in this interval not displayed.   No results found for: "TSH" Lab Results  Component Value Date   HGBA1C 6.8 (H) 10/11/2021   No results found for: "CHOL", "HDL", "LDLCALC", "LDLDIRECT", "TRIG", "CHOLHDL"  Significant Diagnostic Results in last 30 days:  No results found.  Assessment/Plan Osteoarthritis, multiple sites  Fell in her room this morning, c/o left hip region pain, able to bear weight/walking, no deformity or bruise noted.  Healed subacute pelvic-right superior pubic ramus fx, T12, L1 Prn Norco, Gabapentin for pain. May xray if no better.   Dementia ([place]) intermittent agitation  Gait abnormality Ambulates with walker, w/c to go further.   Fall Mechanical, lack of safety awareness, gait instability are contributory, close supervision for safety.   Chronic a-fib ([facility]) heart rate is in control, takes ASA '81mg'$  qd, Hgb 12.1 12/29/21  Essential hypertension Blood pressure is controlled, takes Cozaar, Bun/creat 30/1.0 12/29/21  Diabetes ([place]) on diet, Hgb a1c 6.8 10/11/21  Osteoporosis  takes Prolia, Ca, Vit D  Anxiety due to dementia [facility]) Her mood is stabilizing, taking Quetiapine, Mirtazapine, Depakote     Family/ staff Communication: plan of care reviewed with the patient and charge nurse.   Labs/tests ordered:    Time spend 35 minutes.

## 2022-02-16 ENCOUNTER — Encounter: Payer: Self-pay | Admitting: Nurse Practitioner

## 2022-02-16 DIAGNOSIS — W19XXXA Unspecified fall, initial encounter: Secondary | ICD-10-CM | POA: Insufficient documentation

## 2022-02-16 DIAGNOSIS — R269 Unspecified abnormalities of gait and mobility: Secondary | ICD-10-CM | POA: Insufficient documentation

## 2022-02-16 DIAGNOSIS — R296 Repeated falls: Secondary | ICD-10-CM | POA: Insufficient documentation

## 2022-02-16 NOTE — Assessment & Plan Note (Addendum)
Mechanical, lack of safety awareness, gait instability are contributory, close supervision for safety.

## 2022-02-16 NOTE — Assessment & Plan Note (Signed)
on diet, Hgb a1c 6.8 10/11/21

## 2022-02-16 NOTE — Assessment & Plan Note (Signed)
Fell in her room this morning, c/o left hip region pain, able to bear weight/walking, no deformity or bruise noted.  Healed subacute pelvic-right superior pubic ramus fx, T12, L1 Prn Norco, Gabapentin for pain. May xray if no better.

## 2022-02-16 NOTE — Assessment & Plan Note (Signed)
heart rate is in control, takes ASA '81mg'$  qd, Hgb 12.1 12/29/21

## 2022-02-16 NOTE — Assessment & Plan Note (Signed)
Ambulates with walker, w/c to go further.

## 2022-02-16 NOTE — Assessment & Plan Note (Signed)
takes Prolia, Ca, Vit D 

## 2022-02-16 NOTE — Assessment & Plan Note (Signed)
intermittent agitation

## 2022-02-16 NOTE — Assessment & Plan Note (Signed)
Blood pressure is controlled, takes Cozaar, Bun/creat 30/1.0 12/29/21

## 2022-02-16 NOTE — Assessment & Plan Note (Signed)
Her mood is stabilizing, taking Quetiapine, Mirtazapine, Depakote

## 2022-02-19 ENCOUNTER — Encounter: Payer: Self-pay | Admitting: Nurse Practitioner

## 2022-03-02 ENCOUNTER — Other Ambulatory Visit: Payer: Self-pay

## 2022-03-02 ENCOUNTER — Encounter (HOSPITAL_COMMUNITY): Payer: Self-pay

## 2022-03-02 ENCOUNTER — Emergency Department (HOSPITAL_COMMUNITY)
Admission: EM | Admit: 2022-03-02 | Discharge: 2022-03-03 | Disposition: A | Discharge status: Skilled Nursing Facility | Payer: Medicare Other | Attending: Emergency Medicine | Admitting: Emergency Medicine

## 2022-03-02 DIAGNOSIS — S0990XA Unspecified injury of head, initial encounter: Secondary | ICD-10-CM | POA: Diagnosis present

## 2022-03-02 DIAGNOSIS — Z7982 Long term (current) use of aspirin: Secondary | ICD-10-CM | POA: Insufficient documentation

## 2022-03-02 DIAGNOSIS — R911 Solitary pulmonary nodule: Secondary | ICD-10-CM | POA: Insufficient documentation

## 2022-03-02 DIAGNOSIS — M4802 Spinal stenosis, cervical region: Secondary | ICD-10-CM | POA: Diagnosis not present

## 2022-03-02 DIAGNOSIS — Z79899 Other long term (current) drug therapy: Secondary | ICD-10-CM | POA: Insufficient documentation

## 2022-03-02 DIAGNOSIS — I10 Essential (primary) hypertension: Secondary | ICD-10-CM | POA: Insufficient documentation

## 2022-03-02 DIAGNOSIS — S0003XA Contusion of scalp, initial encounter: Secondary | ICD-10-CM | POA: Insufficient documentation

## 2022-03-02 DIAGNOSIS — E119 Type 2 diabetes mellitus without complications: Secondary | ICD-10-CM | POA: Insufficient documentation

## 2022-03-02 DIAGNOSIS — Z85828 Personal history of other malignant neoplasm of skin: Secondary | ICD-10-CM | POA: Diagnosis not present

## 2022-03-02 DIAGNOSIS — M47812 Spondylosis without myelopathy or radiculopathy, cervical region: Secondary | ICD-10-CM | POA: Diagnosis not present

## 2022-03-02 DIAGNOSIS — W19XXXA Unspecified fall, initial encounter: Secondary | ICD-10-CM | POA: Insufficient documentation

## 2022-03-02 NOTE — ED Triage Notes (Signed)
Pt. BIB [facility] from [facility] for a fall. Pt fell from sitting position and hit her head. Pt. Denies pain with EMS and denies LOC and taking blood thinners.   EMS VS: BP: 170/60 HR:90 O2: 94% CBG: 128

## 2022-03-03 ENCOUNTER — Encounter: Payer: Self-pay | Admitting: Orthopedic Surgery

## 2022-03-03 ENCOUNTER — Emergency Department (HOSPITAL_COMMUNITY): Payer: Medicare Other

## 2022-03-03 ENCOUNTER — Non-Acute Institutional Stay (SKILLED_NURSING_FACILITY): Payer: Medicare Other | Admitting: Orthopedic Surgery

## 2022-03-03 DIAGNOSIS — R269 Unspecified abnormalities of gait and mobility: Secondary | ICD-10-CM

## 2022-03-03 DIAGNOSIS — S0003XD Contusion of scalp, subsequent encounter: Secondary | ICD-10-CM

## 2022-03-03 DIAGNOSIS — R4182 Altered mental status, unspecified: Secondary | ICD-10-CM | POA: Diagnosis not present

## 2022-03-03 DIAGNOSIS — M4802 Spinal stenosis, cervical region: Secondary | ICD-10-CM | POA: Diagnosis not present

## 2022-03-03 DIAGNOSIS — Z743 Need for continuous supervision: Secondary | ICD-10-CM | POA: Diagnosis not present

## 2022-03-03 DIAGNOSIS — M47812 Spondylosis without myelopathy or radiculopathy, cervical region: Secondary | ICD-10-CM | POA: Diagnosis not present

## 2022-03-03 DIAGNOSIS — F03C11 Unspecified dementia, severe, with agitation: Secondary | ICD-10-CM

## 2022-03-03 DIAGNOSIS — W19XXXA Unspecified fall, initial encounter: Secondary | ICD-10-CM

## 2022-03-03 DIAGNOSIS — R911 Solitary pulmonary nodule: Secondary | ICD-10-CM

## 2022-03-03 DIAGNOSIS — S0003XA Contusion of scalp, initial encounter: Secondary | ICD-10-CM | POA: Diagnosis not present

## 2022-03-03 NOTE — Progress Notes (Addendum)
Location:  [facility] Room Number: 35/A Place of Service:  SNF [phone]) Provider:  Yvonna Alanis, NP   Virgie Dad, MD  Patient Care Team: Virgie Dad, MD as PCP - General (Internal Medicine)  Extended Emergency Contact Information Primary Emergency Contact: Kirkman Mobile Phone: [phone] Relation: Grandson Interpreter needed? No Secondary Emergency Contact: Alma Mobile Phone: [phone] Relation: Granddaughter  Code Status:  DNR Goals of care: Advanced Directive information    03/02/2022   11:49 PM  Advanced Directives  Does Patient Have a Medical Advance Directive? Yes  Type of Advance Directive Living will;Healthcare Power of [place];Out of facility DNR (pink MOST or yellow form)  Does patient want to make changes to medical advance directive? No - Patient declined  Copy of [place] in Chart? Yes - validated most recent copy scanned in chart (See row information)     Chief Complaint  Patient presents with   Acute Visit    Hematoma to head    HPI:  Pt is a 88 y.o. female seen today for acute visit due to hematoma to scalp.   08/15 she was leaning forward and lost her balance. She has a large bruise to left temple. She was sent to [facility] ED for evaluation. CT head no acute intracranial abnormality, scalp hematoma over left frontal calvarium, no underlying fracture. 1.8 cm pulmonary nodule to left apex noted, CT chest recommended. She is not on blood thinners. She does take aspiring 81 mg daily. She was discharged back to [facility] skilled unit.   She is a poor historian due to dementia. She denies pain to head, headaches, blurred vision, N/V. Nursing staff reports she will get up on her own and forget to use call bell. Ambulates with walker. She was discharged from PT/OT services 12/2021. Vitals stable today. Manual blood pressure 122/63.    Past Medical History:  Diagnosis Date    Anxiety    Arthritis    Cancer ([place])    hx of skin cancer removal on hand    Colitis    Diabetes mellitus without complication ([place])    Gallstones    Hyperlipidemia    Hypertension    IBS (irritable bowel syndrome)    Osteoporosis    Past Surgical History:  Procedure Laterality Date   APPENDECTOMY     BACK SURGERY     x2   cataract surgery      bilateral    [place]     for glaucoma    left eye peeling surgery      left femur bone surgery      1978   REVERSE SHOULDER ARTHROPLASTY Right 10/10/2014   Procedure: RIGHT REVERSE SHOULDER ARTHROPLASTY;  Surgeon: Justice Britain, MD;  Location: [facility];  Service: Orthopedics;  Laterality: Right;   ROTATOR CUFF REPAIR     TOTAL HIP ARTHROPLASTY Left 02/20/2014   Procedure: LEFT TOTAL HIP ARTHROPLASTY ANTERIOR APPROACH;  Surgeon: Gearlean Alf, MD;  Location: [facility];  Service: Orthopedics;  Laterality: Left;   WRIST SURGERY     left     Allergies  Allergen Reactions   Robaxin [Methocarbamol] Rash and Other (See Comments)    Red rash on right side of face and forehead cleared after benadryl given, per [name], RN   Ancef [Cefazolin] Diarrhea   Dilaudid [Hydromorphone] Other (See Comments)    Stomach upset   Fosamax [Alendronate] Other (See Comments)  Upset stomach   Hyoscyamine Other (See Comments)    "Caused gas"   Morphine And Related Itching   Nsaids Diarrhea   Parathyroid Hormone (Recomb) Other (See Comments)    was not able to give herself the shot   Penicillins Itching    Tolerated Unasyn Has patient had a PCN reaction causing immediate rash, facial/tongue/throat swelling, SOB or lightheadedness with hypotension: Yes Has patient had a PCN reaction causing severe rash involving mucus membranes or skin necrosis: No Has patient had a PCN reaction that required hospitalization: Was in hospital Has patient had a PCN reaction occurring within the last 10 years: No If all of the above answers are "NO",  then may proceed with Cephalosporin use.    Tape Other (See Comments)    Bandages and leads cause bruising and weeping at sites   Flurbiprofen Diarrhea    Outpatient Encounter Medications as of 03/03/2022  Medication Sig   acetaminophen (TYLENOL) 500 MG tablet Take 2 tablets (1,000 mg total) by mouth 3 (three) times daily as needed.   aspirin EC 81 MG tablet Take 81 mg by mouth daily.   calcium carbonate (OSCAL) 1500 (600 Ca) MG TABS tablet Take 600 mg of elemental calcium by mouth daily.   Cholecalciferol 25 MCG (1000 UT) tablet Take 1,000 Units by mouth daily.   denosumab (PROLIA) 60 MG/ML SOSY injection Inject 60 mg into the skin every 6 (six) months. Last injection 06/02/21   divalproex (DEPAKOTE SPRINKLE) 125 MG capsule Take 125 mg by mouth 2 (two) times daily.   docusate sodium 100 MG CAPS Take 100 mg by mouth 2 (two) times daily.   gabapentin (NEURONTIN) 100 MG capsule Take 200 mg by mouth 3 (three) times daily.   lactose free nutrition (BOOST) LIQD Take 237 mLs by mouth 2 (two) times daily between meals.   loratadine (CLARITIN) 10 MG tablet Take 10 mg by mouth daily as needed for allergies.   losartan (COZAAR) 50 MG tablet Take 1 tablet (50 mg total) by mouth daily.   MELATONIN PO Take 3 mg by mouth at bedtime as needed (sleep).   mirtazapine (REMERON) 15 MG tablet Take 15 mg by mouth at bedtime.   Multiple Vitamins-Minerals (PRESERVISION AREDS 2) CAPS Take 1 capsule by mouth daily.   QUEtiapine (SEROQUEL) 25 MG tablet Take 12.5 mg by mouth. 12.5 in the morning and 12.5 at bedtime   traMADol (ULTRAM) 50 MG tablet Take 0.5 tablets (25 mg total) by mouth every 8 (eight) hours as needed.   No facility-administered encounter medications on file as of 03/03/2022.    Review of Systems  Unable to perform ROS: Dementia    Immunization History  Administered Date(s) Administered   Influenza Split 07/04/2009, 04/09/2010, 05/06/2011, 03/29/2012, 05/03/2013   Influenza, High Dose  Seasonal PF 05/02/2013, 05/25/2017, 04/26/2018, 04/24/2019   Influenza, Quadrivalent, Recombinant, Inj, Pf 04/26/2020   Influenza,inj,Quad PF,6+ Mos 05/26/2021   Influenza,inj,quad, With Preservative 04/18/2017   Influenza-Unspecified 05/12/2015   PFIZER(Purple Top)SARS-COV-2 Vaccination 08/25/2019, 09/19/2019   Pfizer Covid-19 Vaccine Bivalent Booster 55yr & up 05/01/2020, 01/20/2021, 10/27/2021   Pneumococcal Conjugate-13 01/29/2014   Pneumococcal Polysaccharide-23 06/07/2007   Td 05/02/2003   Tdap 10/18/2011   Zoster, Live 01/17/2006   Pertinent  Health Maintenance Due  Topic Date Due   FOOT EXAM  Never done   OPHTHALMOLOGY EXAM  Never done   DEXA SCAN  Never done   INFLUENZA VACCINE  02/16/2022   HEMOGLOBIN A1C  04/13/2022  10/15/2021    7:42 AM 10/16/2021   12:00 AM 10/16/2021    9:00 AM 12/10/2021    4:03 PM 03/02/2022   11:50 PM  Fall Risk  Patient Fall Risk Level High fall risk High fall risk High fall risk High fall risk High fall risk   Functional Status Survey:    Vitals:   03/03/22 1325  BP: 122/63  Pulse: 95  Resp: 19  Temp: 98.1 F (36.7 C)  SpO2: 95%  Weight: 139 lb 4.8 oz (63.2 kg)  Height: '5\' 2"'$  (1.575 m)   Body mass index is 25.48 kg/m. Physical Exam Vitals reviewed.  Constitutional:      General: She is not in acute distress. HENT:     Head: Normocephalic. Contusion present.     Comments: Approx 2-3 cm hematoms to left frontal/temporal region, tender, no skin breakdown    Nose: Nose normal.     Mouth/Throat:     Mouth: Mucous membranes are moist.  Eyes:     General:        Right eye: No discharge.        Left eye: No discharge.     Extraocular Movements: Extraocular movements intact.     Pupils: Pupils are equal, round, and reactive to light.     Comments: Cataracts left eye  Cardiovascular:     Rate and Rhythm: Normal rate and regular rhythm.     Pulses: Normal pulses.     Heart sounds: Normal heart sounds.  Pulmonary:      Effort: Pulmonary effort is normal. No respiratory distress.     Breath sounds: Normal breath sounds. No wheezing.  Abdominal:     General: Bowel sounds are normal. There is no distension.     Palpations: Abdomen is soft.     Tenderness: There is no abdominal tenderness.  Musculoskeletal:     Cervical back: Normal range of motion and neck supple.     Right lower leg: No edema.     Left lower leg: No edema.     Comments: Able to move extremities without difficulty  Lymphadenopathy:     Cervical: No cervical adenopathy.  Skin:    General: Skin is warm and dry.     Capillary Refill: Capillary refill takes less than 2 seconds.  Neurological:     General: No focal deficit present.     Mental Status: She is alert. Mental status is at baseline.     Motor: Weakness present.     Gait: Gait abnormal.     Comments: walker  Psychiatric:     Comments: Very pleasant, follows commands, alert to self/familiar face     Labs reviewed: Recent Labs    10/15/21 0640 10/16/21 0328 10/23/21 0000 12/10/21 1601 12/10/21 1823 12/29/21 0000  NA 133* 137   < > 137 134* 135*  K 3.1* 4.0   < > 5.3* 5.1 4.9  CL 101 111   < > 100 101 99  CO2 23 21*   < > 27 27 30*  GLUCOSE 118* 105*  --  99 112*  --   BUN 11 15   < > 38* 35* 30*  CREATININE 0.60 0.61   < > 0.95 0.91 1.0  CALCIUM 8.7* 8.4*   < > 9.4 9.0 9.8  MG 1.7  --   --   --   --   --   PHOS 2.7  --   --   --   --   --    < > =  values in this interval not displayed.   Recent Labs    10/11/21 0325 10/12/21 0322 10/23/21 0000 12/10/21 1601 12/29/21 0000  AST 49* 39 '18 15 18  '$ ALT '25 24 17 11 12  '$ ALKPHOS 41 34* 60 42 56  BILITOT 0.6 0.4  --  0.4  --   PROT 5.8* 5.1*  --  6.8  --   ALBUMIN 3.1* 2.7* 3.6 2.9* 3.7   Recent Labs    10/13/21 0704 10/16/21 0328 10/16/21 0328 10/23/21 0000 11/18/21 0000 12/10/21 1601 12/29/21 0000  WBC 9.4 9.2   < > 7.7 7.1 11.1* 8.0  NEUTROABS  --   --   --  4,766.00  --  7.3 3,584.00  HGB 11.6*  12.0  --  13.0  --  10.4* 12.1  HCT 35.5* 36.7  --  40  --  32.2* 37  MCV 96.7 94.8  --   --   --  98.2  --   PLT 282 365  --  450*  --  363 335   < > = values in this interval not displayed.   No results found for: "TSH" Lab Results  Component Value Date   HGBA1C 6.8 (H) 10/11/2021   No results found for: "CHOL", "HDL", "LDLCALC", "LDLDIRECT", "TRIG", "CHOLHDL"  Significant Diagnostic Results in last 30 days:  CT Head Wo Contrast  Addendum Date: 03/03/2022   ADDENDUM REPORT: 03/03/2022 02:06 ADDENDUM: These results were called by telephone at the time of interpretation on 03/03/2022 at 1:13 am to provider Carey , who verbally acknowledged these results. Electronically Signed   By: Placido Sou M.D.   On: 03/03/2022 02:06   Result Date: 03/03/2022 CLINICAL DATA:  Fall from sitting position, hit left forehead EXAM: CT HEAD WITHOUT CONTRAST CT CERVICAL SPINE WITHOUT CONTRAST TECHNIQUE: Multidetector CT imaging of the head and cervical spine was performed following the standard protocol without intravenous contrast. Multiplanar CT image reconstructions of the cervical spine were also generated. RADIATION DOSE REDUCTION: This exam was performed according to the departmental dose-optimization program which includes automated exposure control, adjustment of the mA and/or kV according to patient size and/or use of iterative reconstruction technique. COMPARISON:  CT 12/10/2021 FINDINGS: CT HEAD FINDINGS Brain: No intracranial hemorrhage, mass effect, or evidence of acute infarct. No hydrocephalus. No extra-axial fluid collection. Generalized cerebral atrophy. Ill-defined hypoattenuation within the cerebral white matter is nonspecific but consistent with chronic small vessel ischemic disease. Vascular: No hyperdense vessel. Calcification of the intracranial internal carotid arteries. Skull: 2.9 x 1.0 cm scalp hematoma overlying the left frontal calvarium. No underlying calvarial fracture  Sinuses/Orbits: No acute finding. Paranasal sinuses and mastoid air cells are well aerated. Other: None. CT CERVICAL SPINE FINDINGS Alignment: Multiple low-grade vertebral body subluxations are unchanged. Skull base and vertebrae: No acute fracture. No primary bone lesion or focal pathologic process. Inferior endplate deformities of T1 and T2 are unchanged. Soft tissues and spinal canal: No prevertebral fluid or swelling. No visible canal hematoma. Disc levels: Multilevel advanced spondylosis, disc space height loss and degenerative endplate changes and facet arthropathy greatest from C5-C7. Posterior disc osteophyte complexes cause multilevel spinal canal narrowing greatest at C7-T1 where it is mild-moderate in combination with ligamentum flavum calcifications. Uncovertebral spurring and facet arthropathy cause multilevel neural foraminal narrowing that is advanced at C3-C4 bilaterally, on the right at C4-C5, and bilaterally at C5-C6. These findings are not significantly changed from prior. Upper chest: 1.8 cm nodule in the left apex is partially visualized. Interlobular  septal thickening and patchy ground-glass opacities in the apices. Other: None. IMPRESSION: 1. 1. No acute intracranial abnormality. Generalized atrophy and small vessel white matter disease. 2. Scalp hematoma overlying the left frontal calvarium. No underlying fracture. 3. No acute fracture in the cervical spine. Multilevel degenerative spondylosis. 4. 1.8 cm pulmonary nodule is partially visualized in the left apex. This may be infectious/inflammatory however is incompletely evaluated on the current exam. Follow-up evaluation with nonemergent chest CT is recommended. 5. Pulmonary edema in the lung apices. Electronically Signed: By: Placido Sou M.D. On: 03/03/2022 01:07   CT Cervical Spine Wo Contrast  Addendum Date: 03/03/2022   ADDENDUM REPORT: 03/03/2022 02:06 ADDENDUM: These results were called by telephone at the time of  interpretation on 03/03/2022 at 1:13 am to provider COURTNEY HORTON , who verbally acknowledged these results. Electronically Signed   By: Placido Sou M.D.   On: 03/03/2022 02:06   Result Date: 03/03/2022 CLINICAL DATA:  Fall from sitting position, hit left forehead EXAM: CT HEAD WITHOUT CONTRAST CT CERVICAL SPINE WITHOUT CONTRAST TECHNIQUE: Multidetector CT imaging of the head and cervical spine was performed following the standard protocol without intravenous contrast. Multiplanar CT image reconstructions of the cervical spine were also generated. RADIATION DOSE REDUCTION: This exam was performed according to the departmental dose-optimization program which includes automated exposure control, adjustment of the mA and/or kV according to patient size and/or use of iterative reconstruction technique. COMPARISON:  CT 12/10/2021 FINDINGS: CT HEAD FINDINGS Brain: No intracranial hemorrhage, mass effect, or evidence of acute infarct. No hydrocephalus. No extra-axial fluid collection. Generalized cerebral atrophy. Ill-defined hypoattenuation within the cerebral white matter is nonspecific but consistent with chronic small vessel ischemic disease. Vascular: No hyperdense vessel. Calcification of the intracranial internal carotid arteries. Skull: 2.9 x 1.0 cm scalp hematoma overlying the left frontal calvarium. No underlying calvarial fracture Sinuses/Orbits: No acute finding. Paranasal sinuses and mastoid air cells are well aerated. Other: None. CT CERVICAL SPINE FINDINGS Alignment: Multiple low-grade vertebral body subluxations are unchanged. Skull base and vertebrae: No acute fracture. No primary bone lesion or focal pathologic process. Inferior endplate deformities of T1 and T2 are unchanged. Soft tissues and spinal canal: No prevertebral fluid or swelling. No visible canal hematoma. Disc levels: Multilevel advanced spondylosis, disc space height loss and degenerative endplate changes and facet arthropathy  greatest from C5-C7. Posterior disc osteophyte complexes cause multilevel spinal canal narrowing greatest at C7-T1 where it is mild-moderate in combination with ligamentum flavum calcifications. Uncovertebral spurring and facet arthropathy cause multilevel neural foraminal narrowing that is advanced at C3-C4 bilaterally, on the right at C4-C5, and bilaterally at C5-C6. These findings are not significantly changed from prior. Upper chest: 1.8 cm nodule in the left apex is partially visualized. Interlobular septal thickening and patchy ground-glass opacities in the apices. Other: None. IMPRESSION: 1. 1. No acute intracranial abnormality. Generalized atrophy and small vessel white matter disease. 2. Scalp hematoma overlying the left frontal calvarium. No underlying fracture. 3. No acute fracture in the cervical spine. Multilevel degenerative spondylosis. 4. 1.8 cm pulmonary nodule is partially visualized in the left apex. This may be infectious/inflammatory however is incompletely evaluated on the current exam. Follow-up evaluation with nonemergent chest CT is recommended. 5. Pulmonary edema in the lung apices. Electronically Signed: By: Placido Sou M.D. On: 03/03/2022 01:07    Assessment/Plan 1. Hematoma of scalp, subsequent encounter - 08/15 mechanical fall- lost balance reaching for item - CT head no acute intracranial abnormality, scalp hematoma over left frontal calvarium, no  underlying fracture - denies headaches, N/V - appears at baseline cognition - cont neuro checks  2. Fall, initial encounter - see above - discharged from PT/OT services 12/2021 - PT/OT evaluation  3. Pulmonary nodule - 1.8 cm nodule noted on CT head 08/16 - f/u CT chest recommended  - will discuss with HPOA- does not want f/u CT chest per goal of care  4. Severe dementia with agitation, unspecified dementia type ([place]) - no recent behaviors - poor safety awareness - cont Depakote and Seroquel  5. Gait  abnormality - see above - ambulates with walker - forgets to call for assistance when getting up    Family/ staff Communication: plan discussed with patient and nurse  Labs/tests ordered:  none

## 2022-03-03 NOTE — Discharge Instructions (Signed)
You were seen today after a mechanical fall.  Your CT scan of your head and neck are largely reassuring.  There was a small pulmonary nodule that was incidentally noted on one of your CT images.  This could be infectious or inflammatory.  Given your absence of symptoms, this was not further evaluated in the emergency department.  You need repeat CT imaging as an outpatient to further evaluate.  Follow-up with your primary physician for this.

## 2022-03-03 NOTE — ED Notes (Signed)
Provider notified of BP 182/85

## 2022-03-03 NOTE — ED Notes (Signed)
[facility] called [facility]

## 2022-03-03 NOTE — ED Provider Notes (Signed)
[facility] DEPT Provider Note   CSN: [id number] Arrival date & time: 03/02/22  2323     History {Add pertinent medical, surgical, social history, OB history to HPI:1} No chief complaint on file.   RHYA SHAN is a 88 y.o. female.  HPI     This is a 88 year old female who presents following a fall.  Patient reports that she leaned over to pick up a piece of newspaper when she lost her balance and fell hitting her head.  She did not lose consciousness.  She is not on blood thinners.  She only reports a headache.  Denies neck pain, hip pain, and extremity pain.  Per EMS vital signs stable in route.  Hematoma noted to the left temple.  Home Medications Prior to Admission medications   Medication Sig Start Date End Date Taking? Authorizing Provider  acetaminophen (TYLENOL) 500 MG tablet Take 2 tablets (1,000 mg total) by mouth 3 (three) times daily as needed. 12/11/21  Yes [place], Amy E, NP  aspirin EC 81 MG tablet Take 81 mg by mouth daily.   Yes [provider]  calcium carbonate (OSCAL) 1500 (600 Ca) MG TABS tablet Take 600 mg of elemental calcium by mouth daily.   Yes [provider]  Cholecalciferol 25 MCG (1000 UT) tablet Take 1,000 Units by mouth daily.   Yes [provider]  divalproex (DEPAKOTE SPRINKLE) 125 MG capsule Take 125 mg by mouth 2 (two) times daily.   Yes [provider]  docusate sodium 100 MG CAPS Take 100 mg by mouth 2 (two) times daily. 02/25/14  Yes Perkins, Alexzandrew L, PA-C  gabapentin (NEURONTIN) 100 MG capsule Take 200 mg by mouth 3 (three) times daily.   Yes [provider]  loratadine (CLARITIN) 10 MG tablet Take 10 mg by mouth daily as needed for allergies.   Yes [provider]  losartan (COZAAR) 50 MG tablet Take 1 tablet (50 mg total) by mouth daily. 10/17/21  Yes Shawna Clamp, MD  MELATONIN PO Take 3 mg by mouth at bedtime as needed (sleep).   Yes [provider]  mirtazapine (REMERON) 15 MG tablet Take 15 mg by mouth at bedtime.   Yes [provider]  Multiple Vitamins-Minerals (PRESERVISION AREDS 2) CAPS Take 1 capsule by mouth daily.   Yes [provider]  QUEtiapine (SEROQUEL) 25 MG tablet Take 12.5 mg by mouth. 12.5 in the morning and 12.5 at bedtime   Yes [provider]  traMADol (ULTRAM) 50 MG tablet Take 0.5 tablets (25 mg total) by mouth every 8 (eight) hours as needed. 02/05/22  Yes Virgie Dad, MD  denosumab (PROLIA) 60 MG/ML SOSY injection Inject 60 mg into the skin every 6 (six) months. Last injection 06/02/21    [provider]  lactose free nutrition (BOOST) LIQD Take 237 mLs by mouth 2 (two) times daily between meals.    [provider]      Allergies    Robaxin [methocarbamol], Ancef [cefazolin], Dilaudid [hydromorphone], Fosamax [alendronate], Hyoscyamine, Morphine and related, Nsaids, Parathyroid hormone (recomb), Penicillins, Tape, and Flurbiprofen    Review of Systems   Review of Systems  Skin:  Positive for wound.  Neurological:  Negative for syncope.  All other systems reviewed and are negative.   Physical Exam Updated Vital Signs BP (!) 180/83   Pulse 93   Temp 98.2 F (36.8 C) (Oral)   Resp (!) 22   SpO2 92%  Physical Exam Vitals  and nursing note reviewed.  Constitutional:      Appearance: She is well-developed. She is not ill-appearing.     Comments: Elderly, nontoxic-appearing  HENT:     Head: Normocephalic.     Comments: Hematoma left forehead and temporal area, no active bleeding    Mouth/Throat:     Mouth: Mucous membranes are moist.  Eyes:     Comments: Cloudy right cornea, left pupil 3 and reactive  Neck:     Comments: No midline C-spine tenderness to palpation Cardiovascular:     Rate and Rhythm: Normal rate and regular rhythm.     Heart sounds: Normal heart sounds.  Pulmonary:     Effort: Pulmonary effort is normal. No respiratory distress.      Breath sounds: No wheezing.  Abdominal:     Palpations: Abdomen is soft.  Musculoskeletal:        General: Normal range of motion.     Cervical back: Normal range of motion and neck supple.     Comments: Normal range of motion all 4 extremities, no deformities noted  Skin:    General: Skin is warm and dry.  Neurological:     Mental Status: She is alert and oriented to person, place, and time.  Psychiatric:        Mood and Affect: Mood normal.     ED Results / Procedures / Treatments   Labs (all labs ordered are listed, but only abnormal results are displayed) Labs Reviewed - No data to display  EKG None  Radiology No results found.  Procedures Procedures  {Document cardiac monitor, telemetry assessment procedure when appropriate:1}  Medications Ordered in ED Medications - No data to display  ED Course/ Medical Decision Making/ A&P                           Medical Decision Making Amount and/or Complexity of Data Reviewed Radiology: ordered.   ***  {Document critical care time when appropriate:1} {Document review of labs and clinical decision tools ie heart score, Chads2Vasc2 etc:1}  {Document your independent review of radiology images, and any outside records:1} {Document your discussion with family members, caretakers, and with consultants:1} {Document social determinants of health affecting pt's care:1} {Document your decision making why or why not admission, treatments were needed:1} Final Clinical Impression(s) / ED Diagnoses Final diagnoses:  None    Rx / DC Orders ED Discharge Orders     None

## 2022-03-04 ENCOUNTER — Telehealth: Payer: Medicare Other | Admitting: Family

## 2022-03-04 NOTE — Telephone Encounter (Signed)
Facility Nurse from [facility] called to notify provider that patient fell.patient complaining of pain on the back of the head states hit the back of the head on the floor.Patient currently on aspirin.  Advised to send patient to the ED for further evaluation.

## 2022-03-05 ENCOUNTER — Encounter: Payer: Self-pay | Admitting: Nurse Practitioner

## 2022-03-05 ENCOUNTER — Non-Acute Institutional Stay (SKILLED_NURSING_FACILITY): Payer: Medicare Other | Admitting: Nurse Practitioner

## 2022-03-05 DIAGNOSIS — S32591A Other specified fracture of right pubis, initial encounter for closed fracture: Secondary | ICD-10-CM

## 2022-03-05 DIAGNOSIS — I10 Essential (primary) hypertension: Secondary | ICD-10-CM | POA: Diagnosis not present

## 2022-03-05 DIAGNOSIS — N390 Urinary tract infection, site not specified: Secondary | ICD-10-CM | POA: Diagnosis not present

## 2022-03-05 DIAGNOSIS — S0003XA Contusion of scalp, initial encounter: Secondary | ICD-10-CM | POA: Insufficient documentation

## 2022-03-05 DIAGNOSIS — F03C11 Unspecified dementia, severe, with agitation: Secondary | ICD-10-CM

## 2022-03-05 DIAGNOSIS — S0003XD Contusion of scalp, subsequent encounter: Secondary | ICD-10-CM | POA: Diagnosis not present

## 2022-03-05 DIAGNOSIS — I482 Chronic atrial fibrillation, unspecified: Secondary | ICD-10-CM

## 2022-03-05 DIAGNOSIS — M810 Age-related osteoporosis without current pathological fracture: Secondary | ICD-10-CM | POA: Diagnosis not present

## 2022-03-05 DIAGNOSIS — E0800 Diabetes mellitus due to underlying condition with hyperosmolarity without nonketotic hyperglycemic-hyperosmolar coma (NKHHC): Secondary | ICD-10-CM | POA: Diagnosis not present

## 2022-03-05 DIAGNOSIS — F0394 Unspecified dementia, unspecified severity, with anxiety: Secondary | ICD-10-CM

## 2022-03-05 DIAGNOSIS — R296 Repeated falls: Secondary | ICD-10-CM | POA: Diagnosis not present

## 2022-03-05 LAB — COMPREHENSIVE METABOLIC PANEL WITH GFR
Albumin: 4 (ref 3.5–5.0)
Calcium: 9.5 (ref 8.7–10.7)
Globulin: 3
eGFR: 46

## 2022-03-05 LAB — HEPATIC FUNCTION PANEL
ALT: 25 U/L (ref 7–35)
AST: 29 (ref 13–35)
Alkaline Phosphatase: 61 (ref 25–125)
Bilirubin, Total: 0.2

## 2022-03-05 LAB — BASIC METABOLIC PANEL
BUN: 40 — AB (ref 4–21)
CO2: 23 — AB (ref 13–22)
Chloride: 97 — AB (ref 99–108)
Creatinine: 1.1 (ref 0.5–1.1)
Glucose: 151
Potassium: 5 mEq/L (ref 3.5–5.1)
Sodium: 133 — AB (ref 137–147)

## 2022-03-05 LAB — CBC: RBC: 4.42 (ref 3.87–5.11)

## 2022-03-05 LAB — CBC AND DIFFERENTIAL
HCT: 41 (ref 36–46)
Hemoglobin: 13.7 (ref 12.0–16.0)
Neutrophils Absolute: 4544
Platelets: 289 10*3/uL (ref 150–400)
WBC: 7.4

## 2022-03-05 NOTE — Assessment & Plan Note (Signed)
Healed subacute pelvic-right superior pubic ramus fx, T12, L1 Prn Tramadol/Tylenol,  Will decrease Gabapentin '200mg'$  bid/itid to eliminate possible cause of falling

## 2022-03-05 NOTE — Assessment & Plan Note (Signed)
takes Prolia, Ca, Vit D 

## 2022-03-05 NOTE — Assessment & Plan Note (Addendum)
Fall 03/04/22, a small bump noted right side of head.   Fall 03/02/22 ED eval, CT head no acute process, left frontal hematoma  Fall 02/15/22   Close supervision for safety, poor safety awareness, unsteady gait are contributory.   [place] again in her room after lunch, will refer to therapy, update CBC/diff, CMP/eGFR  03/05/22 fall 2:50pm, the patient was found between bedside table and bed.  Wbc 7.4, Hgb 13.7, plt 289, neutrophils 61.4, Na 133, K 5.0, Bun 40, creat 1.11

## 2022-03-05 NOTE — Assessment & Plan Note (Signed)
Left frontal, right sided of head a small lump noted. Continue to observe.

## 2022-03-05 NOTE — Progress Notes (Addendum)
Location:  [facility] Room Number: NO/34/A Place of Service:  SNF (31) Provider:  ,  X, NP  Virgie Dad, MD  Patient Care Team: Virgie Dad, MD as PCP - General (Internal Medicine)  Extended Emergency Contact Information Primary Emergency Contact: [place] Mobile Phone: [phone] Relation: Grandson Interpreter needed? No Secondary Emergency Contact: [place] Mobile Phone: [phone] Relation: Granddaughter  Code Status:  DNR Goals of care: Advanced Directive information    03/02/2022   11:49 PM  Advanced Directives  Does Patient Have a Medical Advance Directive? Yes  Type of Advance Directive Living will;Healthcare Power of [place];Out of facility DNR (pink MOST or yellow form)  Does patient want to make changes to medical advance directive? No - Patient declined  Copy of [employer] in Chart? Yes - validated most recent copy scanned in chart (See row information)     Chief Complaint  Patient presents with  . Acute Visit    Patient is being seen for a fall    HPI:  Pt is a 88 y.o. female seen today for an acute visit for fall 03/04/22, a small bump noted right side of head.   Fall 03/02/22 ED eval, CT head no acute process, left frontal hematoma  Fall 02/15/22   Healed subacute pelvic-right superior pubic ramus fx, T12, L1 Prn Tramadol/Tylenol,  Gabapentin for pain.             Dysphagia III diet             Dementia, intermittent agitation             Chronic Afib, heart rate is in control, takes ASA 85m qd, Hgb 12.1 12/29/21             HTN, takes Cozaar, Bun/creat 30/1.0 12/29/21             T2DM, on diet, Hgb a1c 6.8 10/11/21             OP, takes Prolia, Ca, Vit D             Depression/Anxiety, taking Quetiapine, Mirtazapine, Depakote, increased anxiety, irritation, and restlessness . Past Medical History:  Diagnosis Date  . Anxiety   . Arthritis   . [place] ([place]    hx of skin cancer  removal on hand   . Colitis   . Diabetes mellitus without complication ([facility]   . Gallstones   . Hyperlipidemia   . Hypertension   . IBS (irritable bowel syndrome)   . Osteoporosis    Past Surgical History:  Procedure Laterality Date  . APPENDECTOMY    . BACK SURGERY     x2  . cataract surgery      bilateral   . CHOLECYSTECTOMY  1999  . EYE SURGERY     for glaucoma   . left eye peeling surgery     . left femur bone surgery      1978  . REVERSE SHOULDER ARTHROPLASTY Right 10/10/2014   Procedure: RIGHT REVERSE SHOULDER ARTHROPLASTY;  Surgeon: KJustice Britain MD;  [facility]  Service: Orthopedics;  Laterality: Right;  . ROTATOR CUFF REPAIR    . TOTAL HIP ARTHROPLASTY Left 02/20/2014   Procedure: LEFT TOTAL HIP ARTHROPLASTY ANTERIOR APPROACH;  Surgeon: FGearlean Alf MD;  Location: [facility];  Service: Orthopedics;  Laterality: Left;  . WRIST SURGERY     left     Allergies  Allergen Reactions  . Robaxin [Methocarbamol] Rash and Other (  See Comments)    Red rash on right side of face and forehead cleared after benadryl given, per [name], RN  . Ancef [Cefazolin] Diarrhea  . Dilaudid [Hydromorphone] Other (See Comments)    Stomach upset  . Fosamax [Alendronate] Other (See Comments)    Upset stomach  . Hyoscyamine Other (See Comments)    "Caused gas"  . Morphine And Related Itching  . Nsaids Diarrhea  . Parathyroid Hormone (Recomb) Other (See Comments)    was not able to give herself the shot  . Penicillins Itching    Tolerated Unasyn Has patient had a PCN reaction causing immediate rash, facial/tongue/throat swelling, SOB or lightheadedness with hypotension: Yes Has patient had a PCN reaction causing severe rash involving mucus membranes or skin necrosis: No Has patient had a PCN reaction that required hospitalization: Was in hospital Has patient had a PCN reaction occurring within the last 10 years: No If all of the above answers are "NO", then may proceed with  Cephalosporin use.   . Tape Other (See Comments)    Bandages and leads cause bruising and weeping at sites  . Flurbiprofen Diarrhea    Outpatient Encounter Medications as of 03/05/2022  Medication Sig  . acetaminophen (TYLENOL) 500 MG tablet Take 2 tablets (1,000 mg total) by mouth 3 (three) times daily as needed.  Marland Kitchen aspirin EC 81 MG tablet Take 81 mg by mouth daily.  . calcium carbonate (OSCAL) 1500 (600 Ca) MG TABS tablet Take 600 mg of elemental calcium by mouth daily.  . Cholecalciferol 25 MCG (1000 UT) tablet Take 1,000 Units by mouth daily.  Marland Kitchen denosumab (PROLIA) 60 MG/ML SOSY injection Inject 60 mg into the skin every 6 (six) months. Last injection 06/02/21  . divalproex (DEPAKOTE SPRINKLE) 125 MG capsule Take 125 mg by mouth 2 (two) times daily.  Marland Kitchen docusate sodium 100 MG CAPS Take 100 mg by mouth 2 (two) times daily.  Marland Kitchen gabapentin (NEURONTIN) 100 MG capsule Take 200 mg by mouth 3 (three) times daily.  Marland Kitchen lactose free nutrition (BOOST) LIQD Take 237 mLs by mouth 2 (two) times daily between meals.  Marland Kitchen loratadine (CLARITIN) 10 MG tablet Take 10 mg by mouth daily as needed for allergies.  Marland Kitchen losartan (COZAAR) 50 MG tablet Take 1 tablet (50 mg total) by mouth daily.  Marland Kitchen MELATONIN PO Take 3 mg by mouth at bedtime as needed (sleep).  . mirtazapine (REMERON) 15 MG tablet Take 15 mg by mouth at bedtime.  . Multiple Vitamins-Minerals (PRESERVISION AREDS 2) CAPS Take 1 capsule by mouth daily.  . QUEtiapine (SEROQUEL) 25 MG tablet Take 12.5 mg by mouth. 12.5 in the morning and 12.5 at bedtime  . traMADol (ULTRAM) 50 MG tablet Take 0.5 tablets (25 mg total) by mouth every 8 (eight) hours as needed.   No facility-administered encounter medications on file as of 03/05/2022.    Review of Systems  Constitutional:  Negative for appetite change, fatigue and fever.  HENT:  Positive for hearing loss and trouble swallowing. Negative for congestion.   Eyes:  Positive for visual disturbance.       Blind  right eye.   Respiratory:  Negative for cough and shortness of breath.   Cardiovascular:  Negative for leg swelling.  Gastrointestinal:  Negative for abdominal pain and constipation.  Genitourinary:  Positive for frequency. Negative for dysuria and urgency.  Musculoskeletal:  Positive for arthralgias, back pain and gait problem.       Left hip region pain, able to bear  weight.   Skin:  Positive for wound.  Neurological:  Negative for speech difficulty, weakness and headaches.       Memory difficulty.   Psychiatric/Behavioral:  Positive for behavioral problems, confusion and sleep disturbance. The patient is nervous/anxious.     Immunization History  Administered Date(s) Administered  . Influenza Split 07/04/2009, 04/09/2010, 05/06/2011, 03/29/2012, 05/03/2013  . Influenza, High Dose Seasonal PF 05/02/2013, 05/25/2017, 04/26/2018, 04/24/2019  . Influenza, Quadrivalent, Recombinant, Inj, Pf 04/26/2020  . Influenza,inj,Quad PF,6+ Mos 05/26/2021  . Influenza,inj,quad, With Preservative 04/18/2017  . Influenza-Unspecified 05/12/2015  . PFIZER(Purple Top)SARS-COV-2 Vaccination 08/25/2019, 09/19/2019  . Pension scheme manager 80yr & up 05/01/2020, 01/20/2021, 10/27/2021  . Pneumococcal Conjugate-13 01/29/2014  . Pneumococcal Polysaccharide-23 06/07/2007  . Td 05/02/2003  . Tdap 10/18/2011  . Zoster, Live 01/17/2006   Pertinent  Health Maintenance Due  Topic Date Due  . FOOT EXAM  Never done  . OPHTHALMOLOGY EXAM  Never done  . DEXA SCAN  Never done  . INFLUENZA VACCINE  02/16/2022  . HEMOGLOBIN A1C  04/13/2022      10/15/2021    7:42 AM 10/16/2021   12:00 AM 10/16/2021    9:00 AM 12/10/2021    4:03 PM 03/02/2022   11:50 PM  Fall Risk  Patient Fall Risk Level _0    Functional Status Survey:    Vitals:   03/05/22 1108  BP: 138/65  Pulse: 97  Resp: 18  Temp: 98 F (36.7 C)  SpO2: 90%   Weight: 139 lb (63 kg)  Height: 5' 2" (1.575 m)   Body mass index is 25.42 kg/m. Physical Exam Nursing note reviewed.  Constitutional:      Appearance: Normal appearance.  HENT:     Head: Normocephalic and atraumatic.     Nose: Nose normal.     Mouth/Throat:     Mouth: Mucous membranes are moist.  Eyes:     Extraocular Movements: Extraocular movements intact.     Conjunctiva/sclera: Conjunctivae normal.     Pupils: Pupils are equal, round, and reactive to light.     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   Cardiovascular:     Rate and Rhythm: Normal rate and regular rhythm.     Heart sounds: No murmur heard. Pulmonary:     Effort: Pulmonary effort is normal.     Breath sounds: No wheezing or rales.  Abdominal:     General: Bowel sounds are normal.     Palpations: Abdomen is soft.     Tenderness: There is no abdominal tenderness.  Musculoskeletal:        General: Tenderness present.     Cervical back: Normal range of motion and neck supple.     Right lower leg: No edema.     Left lower leg: No edema.     Comments: Left hip region pain after fall this morning, able to bear weight and walk.   Skin:    General: Skin is warm and dry.     Findings: Bruising present.     Comments: Left frontal hematoma, a small lump right sided of head.   Neurological:     General: No focal deficit present.     Mental Status: She is alert. Mental status is at baseline.     Motor: No weakness.     Coordination: Coordination normal.     Gait: Gait abnormal.  Comments: Oriented to person  Psychiatric:        Mood and Affect: Mood normal.    Labs reviewed: Recent Labs    10/15/21 0640 10/16/21 0328 10/23/21 0000 12/10/21 1601 12/10/21 1823 12/29/21 0000  NA 133* 137   < > 137 134* 135*  K 3.1* 4.0   < > 5.3* 5.1 4.9  CL 101 111   < > 100 101 99  CO2 23 21*   < > 27 27 30*  GLUCOSE 118* 105*  --  99 112*  --   BUN 11 15   < > 38* 35* 30*  CREATININE 0.60 0.61   <  > 0.95 0.91 1.0  CALCIUM 8.7* 8.4*   < > 9.4 9.0 9.8  MG 1.7  --   --   --   --   --   PHOS 2.7  --   --   --   --   --    < > = values in this interval not displayed.   Recent Labs    10/11/21 0325 10/12/21 0322 10/23/21 0000 12/10/21 1601 12/29/21 0000  AST 49* 39 _0 ALT _1 ALKPHOS 41 34* 60 42 56  BILITOT 0.6 0.4  --  0.4  --   PROT 5.8* 5.1*  --  6.8  --   ALBUMIN 3.1* 2.7* 3.6 2.9* 3.7   Recent Labs    10/13/21 0704 10/16/21 0328 10/16/21 0328 10/23/21 0000 11/18/21 0000 12/10/21 1601 12/29/21 0000  WBC 9.4 9.2   < > 7.7 7.1 11.1* 8.0  NEUTROABS  --   --   --  4,766.00  --  7.3 3,584.00  HGB 11.6* 12.0  --  13.0  --  10.4* 12.1  HCT 35.5* 36.7  --  40  --  32.2* 37  MCV 96.7 94.8  --   --   --  98.2  --   PLT 282 365  --  450*  --  363 335   < > = values in this interval not displayed.   No results found for: "TSH" Lab Results  Component Value Date   HGBA1C 6.8 (H) 10/11/2021   No results found for: "CHOL", "HDL", "LDLCALC", "LDLDIRECT", "TRIG", "CHOLHDL"  Significant Diagnostic Results in last 30 days:  CT Head Wo Contrast  Addendum Date: 03/03/2022   ADDENDUM REPORT: 03/03/2022 02:06 ADDENDUM: These results were called by telephone at the time of interpretation on 03/03/2022 at 1:13 am to provider Duncan , who verbally acknowledged these results. Electronically Signed   By: Placido Sou M.D.   On: 03/03/2022 02:06   Result Date: 03/03/2022 CLINICAL DATA:  Fall from sitting position, hit left forehead EXAM: CT HEAD WITHOUT CONTRAST CT CERVICAL SPINE WITHOUT CONTRAST TECHNIQUE: Multidetector CT imaging of the head and cervical spine was performed following the standard protocol without intravenous contrast. Multiplanar CT image reconstructions of the cervical spine were also generated. RADIATION DOSE REDUCTION: This exam was performed according to the departmental dose-optimization program which includes automated exposure control,  adjustment of the mA and/or kV according to patient size and/or use of iterative reconstruction technique. COMPARISON:  CT 12/10/2021 FINDINGS: CT HEAD FINDINGS Brain: No intracranial hemorrhage, mass effect, or evidence of acute infarct. No hydrocephalus. No extra-axial fluid collection. Generalized cerebral atrophy. Ill-defined hypoattenuation within the cerebral white matter is nonspecific but consistent with chronic small vessel ischemic disease. Vascular: No hyperdense vessel. Calcification of the intracranial internal  carotid arteries. Skull: 2.9 x 1.0 cm scalp hematoma overlying the left frontal calvarium. No underlying calvarial fracture Sinuses/Orbits: No acute finding. Paranasal sinuses and mastoid air cells are well aerated. Other: None. CT CERVICAL SPINE FINDINGS Alignment: Multiple low-grade vertebral body subluxations are unchanged. Skull base and vertebrae: No acute fracture. No primary bone lesion or focal pathologic process. Inferior endplate deformities of T1 and T2 are unchanged. Soft tissues and spinal canal: No prevertebral fluid or swelling. No visible canal hematoma. Disc levels: Multilevel advanced spondylosis, disc space height loss and degenerative endplate changes and facet arthropathy greatest from C5-C7. Posterior disc osteophyte complexes cause multilevel spinal canal narrowing greatest at C7-T1 where it is mild-moderate in combination with ligamentum flavum calcifications. Uncovertebral spurring and facet arthropathy cause multilevel neural foraminal narrowing that is advanced at C3-C4 bilaterally, on the right at C4-C5, and bilaterally at C5-C6. These findings are not significantly changed from prior. Upper chest: 1.8 cm nodule in the left apex is partially visualized. Interlobular septal thickening and patchy ground-glass opacities in the apices. Other: None. IMPRESSION: 1. 1. No acute intracranial abnormality. Generalized atrophy and small vessel white matter disease. 2. Scalp  hematoma overlying the left frontal calvarium. No underlying fracture. 3. No acute fracture in the cervical spine. Multilevel degenerative spondylosis. 4. 1.8 cm pulmonary nodule is partially visualized in the left apex. This may be infectious/inflammatory however is incompletely evaluated on the current exam. Follow-up evaluation with nonemergent chest CT is recommended. 5. Pulmonary edema in the lung apices. Electronically Signed: By: Placido Sou M.D. On: 03/03/2022 01:07   CT Cervical Spine Wo Contrast  Addendum Date: 03/03/2022   ADDENDUM REPORT: 03/03/2022 02:06 ADDENDUM: These results were called by telephone at the time of interpretation on 03/03/2022 at 1:13 am to provider COURTNEY HORTON , who verbally acknowledged these results. Electronically Signed   By: Placido Sou M.D.   On: 03/03/2022 02:06   Result Date: 03/03/2022 CLINICAL DATA:  Fall from sitting position, hit left forehead EXAM: CT HEAD WITHOUT CONTRAST CT CERVICAL SPINE WITHOUT CONTRAST TECHNIQUE: Multidetector CT imaging of the head and cervical spine was performed following the standard protocol without intravenous contrast. Multiplanar CT image reconstructions of the cervical spine were also generated. RADIATION DOSE REDUCTION: This exam was performed according to the departmental dose-optimization program which includes automated exposure control, adjustment of the mA and/or kV according to patient size and/or use of iterative reconstruction technique. COMPARISON:  CT 12/10/2021 FINDINGS: CT HEAD FINDINGS Brain: No intracranial hemorrhage, mass effect, or evidence of acute infarct. No hydrocephalus. No extra-axial fluid collection. Generalized cerebral atrophy. Ill-defined hypoattenuation within the cerebral white matter is nonspecific but consistent with chronic small vessel ischemic disease. Vascular: No hyperdense vessel. Calcification of the intracranial internal carotid arteries. Skull: 2.9 x 1.0 cm scalp hematoma overlying  the left frontal calvarium. No underlying calvarial fracture Sinuses/Orbits: No acute finding. Paranasal sinuses and mastoid air cells are well aerated. Other: None. CT CERVICAL SPINE FINDINGS Alignment: Multiple low-grade vertebral body subluxations are unchanged. Skull base and vertebrae: No acute fracture. No primary bone lesion or focal pathologic process. Inferior endplate deformities of T1 and T2 are unchanged. Soft tissues and spinal canal: No prevertebral fluid or swelling. No visible canal hematoma. Disc levels: Multilevel advanced spondylosis, disc space height loss and degenerative endplate changes and facet arthropathy greatest from C5-C7. Posterior disc osteophyte complexes cause multilevel spinal canal narrowing greatest at C7-T1 where it is mild-moderate in combination with ligamentum flavum calcifications. Uncovertebral spurring and facet arthropathy cause  multilevel neural foraminal narrowing that is advanced at C3-C4 bilaterally, on the right at C4-C5, and bilaterally at C5-C6. These findings are not significantly changed from prior. Upper chest: 1.8 cm nodule in the left apex is partially visualized. Interlobular septal thickening and patchy ground-glass opacities in the apices. Other: None. IMPRESSION: 1. 1. No acute intracranial abnormality. Generalized atrophy and small vessel white matter disease. 2. Scalp hematoma overlying the left frontal calvarium. No underlying fracture. 3. No acute fracture in the cervical spine. Multilevel degenerative spondylosis. 4. 1.8 cm pulmonary nodule is partially visualized in the left apex. This may be infectious/inflammatory however is incompletely evaluated on the current exam. Follow-up evaluation with nonemergent chest CT is recommended. 5. Pulmonary edema in the lung apices. Electronically Signed: By: Placido Sou M.D. On: 03/03/2022 01:07    Assessment/Plan Fracture of multiple pubic rami, right, closed, initial encounter (HCC) Healed subacute  pelvic-right superior pubic ramus fx, T12, L1 Prn Tramadol/Tylenol,  Will decrease Gabapentin 263m bid/itid to eliminate possible cause of falling   Anxiety due to dementia ([facility]  taking Quetiapine, Mirtazapine, Depakote, increased anxiety, irritation, and restlessness, will resume prn Lorazepam 0.587mq6hr prn  Frequent falls  Fall 03/04/22, a small bump noted right side of head.   Fall 03/02/22 ED eval, CT head no acute process, left frontal hematoma  Fall 02/15/22   Close supervision for safety, poor safety awareness, unsteady gait are contributory.   FeGolden Circlegain in her room after lunch, will refer to therapy, update CBC/diff, CMP/eGFR  03/05/22 fall 2:50pm, the patient was found between bedside table and bed.  Wbc 7.4, Hgb 13.7, plt 289, neutrophils 61.4, Na 133, K 5.0, Bun 40, creat 1.11  Scalp hematoma Left frontal, right sided of head a small lump noted. Continue to observe.   Dementia (HCAvonIntermittent agitation, continue SN[facility] for supportive care  Chronic a-fib ([place] heart rate is in control, takes ASA 8137md, Hgb 12.1 12/29/21  Essential hypertension Blood pressure is controlled, takes Cozaar, Bun/creat 30/1.0 12/29/21  Diabetes (HCCGrundyn diet, Hgb a1c 6.8 10/11/21  Osteoporosis takes Prolia, Ca, Vit D     Family/ staff Communication: plan of care reviewed with the patient and charge nurse.   Labs/tests ordered: CBC/diff, CMP/eGFR  Time spend 35 minutes.

## 2022-03-05 NOTE — Assessment & Plan Note (Signed)
Intermittent agitation, continue [facility] for supportive care

## 2022-03-05 NOTE — Assessment & Plan Note (Signed)
taking Quetiapine, Mirtazapine, Depakote, increased anxiety, irritation, and restlessness, will resume prn Lorazepam 0.'5mg'$  q6hr prn

## 2022-03-05 NOTE — Assessment & Plan Note (Signed)
on diet, Hgb a1c 6.8 10/11/21

## 2022-03-05 NOTE — Assessment & Plan Note (Signed)
heart rate is in control, takes ASA '81mg'$  qd, Hgb 12.1 12/29/21

## 2022-03-05 NOTE — Assessment & Plan Note (Signed)
Blood pressure is controlled, takes Cozaar, Bun/creat 30/1.0 12/29/21

## 2022-03-09 ENCOUNTER — Encounter: Payer: Self-pay | Admitting: Internal Medicine

## 2022-03-09 ENCOUNTER — Non-Acute Institutional Stay (SKILLED_NURSING_FACILITY): Payer: Medicare Other | Admitting: Internal Medicine

## 2022-03-09 DIAGNOSIS — I5033 Acute on chronic diastolic (congestive) heart failure: Secondary | ICD-10-CM

## 2022-03-09 DIAGNOSIS — R051 Acute cough: Secondary | ICD-10-CM

## 2022-03-09 DIAGNOSIS — E0800 Diabetes mellitus due to underlying condition with hyperosmolarity without nonketotic hyperglycemic-hyperosmolar coma (NKHHC): Secondary | ICD-10-CM

## 2022-03-09 DIAGNOSIS — R296 Repeated falls: Secondary | ICD-10-CM | POA: Diagnosis not present

## 2022-03-09 DIAGNOSIS — J209 Acute bronchitis, unspecified: Secondary | ICD-10-CM | POA: Diagnosis not present

## 2022-03-09 DIAGNOSIS — R2681 Unsteadiness on feet: Secondary | ICD-10-CM | POA: Diagnosis not present

## 2022-03-09 DIAGNOSIS — R918 Other nonspecific abnormal finding of lung field: Secondary | ICD-10-CM | POA: Diagnosis not present

## 2022-03-09 DIAGNOSIS — F03C0 Unspecified dementia, severe, without behavioral disturbance, psychotic disturbance, mood disturbance, and anxiety: Secondary | ICD-10-CM | POA: Diagnosis not present

## 2022-03-09 DIAGNOSIS — R1312 Dysphagia, oropharyngeal phase: Secondary | ICD-10-CM | POA: Diagnosis not present

## 2022-03-09 DIAGNOSIS — I10 Essential (primary) hypertension: Secondary | ICD-10-CM | POA: Diagnosis not present

## 2022-03-09 DIAGNOSIS — R41841 Cognitive communication deficit: Secondary | ICD-10-CM | POA: Diagnosis not present

## 2022-03-09 DIAGNOSIS — M810 Age-related osteoporosis without current pathological fracture: Secondary | ICD-10-CM | POA: Diagnosis not present

## 2022-03-09 DIAGNOSIS — M6281 Muscle weakness (generalized): Secondary | ICD-10-CM | POA: Diagnosis not present

## 2022-03-09 DIAGNOSIS — R1310 Dysphagia, unspecified: Secondary | ICD-10-CM | POA: Diagnosis not present

## 2022-03-09 NOTE — Progress Notes (Unsigned)
Location:  [facility] Room Number: 35 Place of Service:  SNF (31)  Provider:   Code Status: DNR Goals of Care:     03/09/2022    1:42 PM  Advanced Directives  Does Patient Have a Medical Advance Directive? Yes  Type of Paramedic of [place];Living will;Out of facility DNR (pink MOST or yellow form)  Does patient want to make changes to medical advance directive? No - Patient declined  Copy of [place] in Chart? Yes - validated most recent copy scanned in chart (See row information)  Pre-existing out of facility DNR order (yellow form or pink MOST form) Pink MOST form placed in chart (order not valid for inpatient use);Yellow form placed in chart (order not valid for inpatient use)     Chief Complaint  Patient presents with   Medical Management of Chronic Issues    Routine follow up   Immunizations    Shingrix vaccine,Tetanus/tdap, COVID vaccine, Flu vaccine due   Quality Metric Gaps    Foot exam,eye exam, dexa scan due    HPI: Patient is a 88 y.o. female seen today for medical management of chronic diseases.    Lives in SNF  Patient has h/o Dementia.with Behaviors Hypertension and Diabetes H/o T11 compression fracture and Subacute Pelvic fracture  Main Issues today Falls has had many falls in last few months as she tries to get up without assist and falls Gabapentin reduced to see if it will help  Her other issue today is coughing with some shortness of breath.  Denies any chest pain no fever.  Coughing up clear mucus.  Continues to have some behavior issues Needs help with her ADLs is mostly wheelchair-bound Has gained weight since 06/23 129 lbs to 139 lbs now  Past Medical History:  Diagnosis Date   Anxiety    Arthritis    Cancer ([place])    hx of skin cancer removal on hand    Colitis    Diabetes mellitus without complication ([place])    Gallstones    Hyperlipidemia    Hypertension    IBS  (irritable bowel syndrome)    Osteoporosis     Past Surgical History:  Procedure Laterality Date   APPENDECTOMY     BACK SURGERY     x2   cataract surgery      bilateral    [place]     for glaucoma    left eye peeling surgery      left femur bone surgery      1978   REVERSE SHOULDER ARTHROPLASTY Right 10/10/2014   Procedure: RIGHT REVERSE SHOULDER ARTHROPLASTY;  Surgeon: Justice Britain, MD;  Location: [facility];  Service: Orthopedics;  Laterality: Right;   ROTATOR CUFF REPAIR     TOTAL HIP ARTHROPLASTY Left 02/20/2014   Procedure: LEFT TOTAL HIP ARTHROPLASTY ANTERIOR APPROACH;  Surgeon: Gearlean Alf, MD;  Location: [facility];  Service: Orthopedics;  Laterality: Left;   WRIST SURGERY     left     Allergies  Allergen Reactions   Robaxin [Methocarbamol] Rash and Other (See Comments)    Red rash on right side of face and forehead cleared after benadryl given, per [name], RN   Ancef [Cefazolin] Diarrhea   Dilaudid [Hydromorphone] Other (See Comments)    Stomach upset   Fosamax [Alendronate] Other (See Comments)    Upset stomach   Hyoscyamine Other (See Comments)    "Caused  gas"   Morphine And Related Itching   Nsaids Diarrhea   Parathyroid Hormone (Recomb) Other (See Comments)    was not able to give herself the shot   Penicillins Itching    Tolerated Unasyn Has patient had a PCN reaction causing immediate rash, facial/tongue/throat swelling, SOB or lightheadedness with hypotension: Yes Has patient had a PCN reaction causing severe rash involving mucus membranes or skin necrosis: No Has patient had a PCN reaction that required hospitalization: Was in hospital Has patient had a PCN reaction occurring within the last 10 years: No If all of the above answers are "NO", then may proceed with Cephalosporin use.    Tape Other (See Comments)    Bandages and leads cause bruising and weeping at sites   Flurbiprofen Diarrhea    Outpatient Encounter Medications  as of 03/09/2022  Medication Sig   acetaminophen (TYLENOL) 500 MG tablet Take 2 tablets (1,000 mg total) by mouth 3 (three) times daily as needed.   aspirin EC 81 MG tablet Take 81 mg by mouth daily.   calcium carbonate (OSCAL) 1500 (600 Ca) MG TABS tablet Take 600 mg of elemental calcium by mouth daily.   Cholecalciferol 25 MCG (1000 UT) tablet Take 1,000 Units by mouth daily.   denosumab (PROLIA) 60 MG/ML SOSY injection Inject 60 mg into the skin every 6 (six) months. Last injection 06/02/21   divalproex (DEPAKOTE SPRINKLE) 125 MG capsule Take 125 mg by mouth 2 (two) times daily.   docusate sodium 100 MG CAPS Take 100 mg by mouth 2 (two) times daily.   gabapentin (NEURONTIN) 100 MG capsule Take 200 mg by mouth 3 (three) times daily.   lactose free nutrition (BOOST) LIQD Take 237 mLs by mouth 2 (two) times daily between meals.   loratadine (CLARITIN) 10 MG tablet Take 10 mg by mouth daily as needed for allergies.   LORazepam (ATIVAN) 0.5 MG tablet Take 0.5 mg by mouth every 6 (six) hours as needed for anxiety.   losartan (COZAAR) 50 MG tablet Take 1 tablet (50 mg total) by mouth daily.   MELATONIN PO Take 3 mg by mouth at bedtime as needed (sleep).   mirtazapine (REMERON) 15 MG tablet Take 15 mg by mouth at bedtime.   Multiple Vitamins-Minerals (PRESERVISION AREDS 2) CAPS Take 1 capsule by mouth daily.   QUEtiapine (SEROQUEL) 25 MG tablet Take 12.5 mg by mouth. 12.5 in the morning and 12.5 at bedtime   traMADol (ULTRAM) 50 MG tablet Take 0.5 tablets (25 mg total) by mouth every 8 (eight) hours as needed.   No facility-administered encounter medications on file as of 03/09/2022.    Review of Systems:  Review of Systems  Constitutional:  Positive for activity change. Negative for appetite change.  HENT: Negative.    Respiratory:  Positive for cough and shortness of breath.   Cardiovascular:  Positive for leg swelling.  Gastrointestinal:  Negative for constipation.  Genitourinary: Negative.    Musculoskeletal:  Positive for gait problem. Negative for arthralgias and myalgias.  Skin: Negative.   Neurological:  Negative for dizziness and weakness.  Psychiatric/Behavioral:  Positive for behavioral problems and confusion. Negative for dysphoric mood and sleep disturbance. The patient is nervous/anxious.     Health Maintenance  Topic Date Due   FOOT EXAM  Never done   OPHTHALMOLOGY EXAM  Never done   Zoster Vaccines- Shingrix (1 of 2) Never done   DEXA SCAN  Never done   TETANUS/TDAP  10/17/2021   COVID-19 Vaccine (6 -  Pfizer risk series) 12/22/2021   INFLUENZA VACCINE  02/16/2022   HEMOGLOBIN A1C  04/13/2022   Pneumonia Vaccine 69+ Years old  Completed   HPV VACCINES  Aged Out    Physical Exam: Vitals:   03/09/22 1335  BP: 138/65  Pulse: 97  Resp: 18  Temp: (!) 97.5 F (36.4 C)  SpO2: 92%  Weight: 139 lb 4.8 oz (63.2 kg)  Height: '5\' 2"'$  (1.575 m)   Body mass index is 25.48 kg/m. Physical Exam Vitals reviewed.  Constitutional:      Appearance: Normal appearance.  HENT:     Head: Normocephalic.     Comments: Has a bruise around her Left Eye    Nose: Nose normal.     Mouth/Throat:     Mouth: Mucous membranes are moist.     Pharynx: Oropharynx is clear.  Eyes:     Pupils: Pupils are equal, round, and reactive to light.  Cardiovascular:     Rate and Rhythm: Normal rate and regular rhythm.     Pulses: Normal pulses.     Heart sounds: Normal heart sounds. No murmur heard. Pulmonary:     Effort: Pulmonary effort is normal.     Breath sounds: Normal breath sounds.     Comments: Has Crackles Bilateral in lower part of chest Abdominal:     General: Abdomen is flat. Bowel sounds are normal.     Palpations: Abdomen is soft.  Musculoskeletal:        General: Swelling present.     Cervical back: Neck supple.  Skin:    General: Skin is warm.  Neurological:     General: No focal deficit present.     Mental Status: She is alert.  Psychiatric:        Mood and  Affect: Mood normal.        Thought Content: Thought content normal.     Labs reviewed: Basic Metabolic Panel: Recent Labs    10/15/21 0640 10/16/21 0328 10/23/21 0000 12/10/21 1601 12/10/21 1823 12/29/21 0000 02/02/22 0000  NA 133* 137   < > 137 134* 135* 140  K 3.1* 4.0   < > 5.3* 5.1 4.9 3.6  CL 101 111   < > 100 101 99 101  CO2 23 21*   < > 27 27 30* 30*  GLUCOSE 118* 105*  --  99 112*  --   --   BUN 11 15   < > 38* 35* 30* 40*  CREATININE 0.60 0.61   < > 0.95 0.91 1.0 1.2*  CALCIUM 8.7* 8.4*   < > 9.4 9.0 9.8 9.9  MG 1.7  --   --   --   --   --   --   PHOS 2.7  --   --   --   --   --   --    < > = values in this interval not displayed.   Liver Function Tests: Recent Labs    10/11/21 0325 10/12/21 0322 10/23/21 0000 12/10/21 1601 12/29/21 0000  AST 49* 39 '18 15 18  '$ ALT '25 24 17 11 12  '$ ALKPHOS 41 34* 60 42 56  BILITOT 0.6 0.4  --  0.4  --   PROT 5.8* 5.1*  --  6.8  --   ALBUMIN 3.1* 2.7* 3.6 2.9* 3.7   No results for input(s): "LIPASE", "AMYLASE" in the last 8760 hours. No results for input(s): "AMMONIA" in the last 8760 hours. CBC: Recent Labs  10/13/21 0704 10/16/21 0328 10/16/21 0328 10/23/21 0000 11/18/21 0000 12/10/21 1601 12/29/21 0000  WBC 9.4 9.2   < > 7.7 7.1 11.1* 8.0  NEUTROABS  --   --   --  4,766.00  --  7.3 3,584.00  HGB 11.6* 12.0  --  13.0  --  10.4* 12.1  HCT 35.5* 36.7  --  40  --  32.2* 37  MCV 96.7 94.8  --   --   --  98.2  --   PLT 282 365  --  450*  --  363 335   < > = values in this interval not displayed.   Lipid Panel: No results for input(s): "CHOL", "HDL", "LDLCALC", "TRIG", "CHOLHDL", "LDLDIRECT" in the last 8760 hours. Lab Results  Component Value Date   HGBA1C 6.8 (H) 10/11/2021    Procedures since last visit: CT Head Wo Contrast  Addendum Date: 03/03/2022   ADDENDUM REPORT: 03/03/2022 02:06 ADDENDUM: These results were called by telephone at the time of interpretation on 03/03/2022 at 1:13 am to provider  COURTNEY HORTON , who verbally acknowledged these results. Electronically Signed   By: Placido Sou M.D.   On: 03/03/2022 02:06   Result Date: 03/03/2022 CLINICAL DATA:  Fall from sitting position, hit left forehead EXAM: CT HEAD WITHOUT CONTRAST CT CERVICAL SPINE WITHOUT CONTRAST TECHNIQUE: Multidetector CT imaging of the head and cervical spine was performed following the standard protocol without intravenous contrast. Multiplanar CT image reconstructions of the cervical spine were also generated. RADIATION DOSE REDUCTION: This exam was performed according to the departmental dose-optimization program which includes automated exposure control, adjustment of the mA and/or kV according to patient size and/or use of iterative reconstruction technique. COMPARISON:  CT 12/10/2021 FINDINGS: CT HEAD FINDINGS Brain: No intracranial hemorrhage, mass effect, or evidence of acute infarct. No hydrocephalus. No extra-axial fluid collection. Generalized cerebral atrophy. Ill-defined hypoattenuation within the cerebral white matter is nonspecific but consistent with chronic small vessel ischemic disease. Vascular: No hyperdense vessel. Calcification of the intracranial internal carotid arteries. Skull: 2.9 x 1.0 cm scalp hematoma overlying the left frontal calvarium. No underlying calvarial fracture Sinuses/Orbits: No acute finding. Paranasal sinuses and mastoid air cells are well aerated. Other: None. CT CERVICAL SPINE FINDINGS Alignment: Multiple low-grade vertebral body subluxations are unchanged. Skull base and vertebrae: No acute fracture. No primary bone lesion or focal pathologic process. Inferior endplate deformities of T1 and T2 are unchanged. Soft tissues and spinal canal: No prevertebral fluid or swelling. No visible canal hematoma. Disc levels: Multilevel advanced spondylosis, disc space height loss and degenerative endplate changes and facet arthropathy greatest from C5-C7. Posterior disc osteophyte complexes  cause multilevel spinal canal narrowing greatest at C7-T1 where it is mild-moderate in combination with ligamentum flavum calcifications. Uncovertebral spurring and facet arthropathy cause multilevel neural foraminal narrowing that is advanced at C3-C4 bilaterally, on the right at C4-C5, and bilaterally at C5-C6. These findings are not significantly changed from prior. Upper chest: 1.8 cm nodule in the left apex is partially visualized. Interlobular septal thickening and patchy ground-glass opacities in the apices. Other: None. IMPRESSION: 1. 1. No acute intracranial abnormality. Generalized atrophy and small vessel white matter disease. 2. Scalp hematoma overlying the left frontal calvarium. No underlying fracture. 3. No acute fracture in the cervical spine. Multilevel degenerative spondylosis. 4. 1.8 cm pulmonary nodule is partially visualized in the left apex. This may be infectious/inflammatory however is incompletely evaluated on the current exam. Follow-up evaluation with nonemergent chest CT is recommended. 5. Pulmonary  edema in the lung apices. Electronically Signed: By: Placido Sou M.D. On: 03/03/2022 01:07   CT Cervical Spine Wo Contrast  Addendum Date: 03/03/2022   ADDENDUM REPORT: 03/03/2022 02:06 ADDENDUM: These results were called by telephone at the time of interpretation on 03/03/2022 at 1:13 am to provider COURTNEY HORTON , who verbally acknowledged these results. Electronically Signed   By: Placido Sou M.D.   On: 03/03/2022 02:06   Result Date: 03/03/2022 CLINICAL DATA:  Fall from sitting position, hit left forehead EXAM: CT HEAD WITHOUT CONTRAST CT CERVICAL SPINE WITHOUT CONTRAST TECHNIQUE: Multidetector CT imaging of the head and cervical spine was performed following the standard protocol without intravenous contrast. Multiplanar CT image reconstructions of the cervical spine were also generated. RADIATION DOSE REDUCTION: This exam was performed according to the departmental  dose-optimization program which includes automated exposure control, adjustment of the mA and/or kV according to patient size and/or use of iterative reconstruction technique. COMPARISON:  CT 12/10/2021 FINDINGS: CT HEAD FINDINGS Brain: No intracranial hemorrhage, mass effect, or evidence of acute infarct. No hydrocephalus. No extra-axial fluid collection. Generalized cerebral atrophy. Ill-defined hypoattenuation within the cerebral white matter is nonspecific but consistent with chronic small vessel ischemic disease. Vascular: No hyperdense vessel. Calcification of the intracranial internal carotid arteries. Skull: 2.9 x 1.0 cm scalp hematoma overlying the left frontal calvarium. No underlying calvarial fracture Sinuses/Orbits: No acute finding. Paranasal sinuses and mastoid air cells are well aerated. Other: None. CT CERVICAL SPINE FINDINGS Alignment: Multiple low-grade vertebral body subluxations are unchanged. Skull base and vertebrae: No acute fracture. No primary bone lesion or focal pathologic process. Inferior endplate deformities of T1 and T2 are unchanged. Soft tissues and spinal canal: No prevertebral fluid or swelling. No visible canal hematoma. Disc levels: Multilevel advanced spondylosis, disc space height loss and degenerative endplate changes and facet arthropathy greatest from C5-C7. Posterior disc osteophyte complexes cause multilevel spinal canal narrowing greatest at C7-T1 where it is mild-moderate in combination with ligamentum flavum calcifications. Uncovertebral spurring and facet arthropathy cause multilevel neural foraminal narrowing that is advanced at C3-C4 bilaterally, on the right at C4-C5, and bilaterally at C5-C6. These findings are not significantly changed from prior. Upper chest: 1.8 cm nodule in the left apex is partially visualized. Interlobular septal thickening and patchy ground-glass opacities in the apices. Other: None. IMPRESSION: 1. 1. No acute intracranial abnormality.  Generalized atrophy and small vessel white matter disease. 2. Scalp hematoma overlying the left frontal calvarium. No underlying fracture. 3. No acute fracture in the cervical spine. Multilevel degenerative spondylosis. 4. 1.8 cm pulmonary nodule is partially visualized in the left apex. This may be infectious/inflammatory however is incompletely evaluated on the current exam. Follow-up evaluation with nonemergent chest CT is recommended. 5. Pulmonary edema in the lung apices. Electronically Signed: By: Placido Sou M.D. On: 03/03/2022 01:07    Assessment/Plan Acute cough Chest Xray Her exam looks like she is volume overloded EF in the past has been normal Will start her on Lasix 20 mg QD for 7 days and then reval Potassium 10 meq Repeat BMP in 1 week Acute on chronic diastolic congestive heart failure (HCC) Lasix 20 mg QD  Frequent falls Due to her Cognition and Unstable gait  Dementia With Behaviors On Seroquel and Depakote Essential hypertension On Cozar Diabetes mellitus due to underlying condition with hyperosmolarity without coma, without long-term current use of insulin (HCC) A1C good levels Age related osteoporosis, unspecified pathological fracture presence Prolia Depression On Remeron ? Pulmonary Nodule on CT  scan  Not candidate for work up  Labs/tests ordered:  CBC,CMP,A1C Next appt:  Visit date not found

## 2022-03-10 DIAGNOSIS — R2681 Unsteadiness on feet: Secondary | ICD-10-CM | POA: Diagnosis not present

## 2022-03-10 DIAGNOSIS — R1312 Dysphagia, oropharyngeal phase: Secondary | ICD-10-CM | POA: Diagnosis not present

## 2022-03-10 DIAGNOSIS — F03C0 Unspecified dementia, severe, without behavioral disturbance, psychotic disturbance, mood disturbance, and anxiety: Secondary | ICD-10-CM | POA: Diagnosis not present

## 2022-03-10 DIAGNOSIS — R1310 Dysphagia, unspecified: Secondary | ICD-10-CM | POA: Diagnosis not present

## 2022-03-10 DIAGNOSIS — R41841 Cognitive communication deficit: Secondary | ICD-10-CM | POA: Diagnosis not present

## 2022-03-10 DIAGNOSIS — M6281 Muscle weakness (generalized): Secondary | ICD-10-CM | POA: Diagnosis not present

## 2022-03-11 ENCOUNTER — Non-Acute Institutional Stay (SKILLED_NURSING_FACILITY): Payer: Medicare Other | Admitting: Nurse Practitioner

## 2022-03-11 ENCOUNTER — Encounter: Payer: Self-pay | Admitting: Nurse Practitioner

## 2022-03-11 DIAGNOSIS — R296 Repeated falls: Secondary | ICD-10-CM | POA: Diagnosis not present

## 2022-03-11 DIAGNOSIS — M150 Primary generalized (osteo)arthritis: Secondary | ICD-10-CM

## 2022-03-11 DIAGNOSIS — R1310 Dysphagia, unspecified: Secondary | ICD-10-CM

## 2022-03-11 DIAGNOSIS — F03C11 Unspecified dementia, severe, with agitation: Secondary | ICD-10-CM

## 2022-03-11 DIAGNOSIS — F0394 Unspecified dementia, unspecified severity, with anxiety: Secondary | ICD-10-CM

## 2022-03-11 DIAGNOSIS — E871 Hypo-osmolality and hyponatremia: Secondary | ICD-10-CM | POA: Diagnosis not present

## 2022-03-11 DIAGNOSIS — M25552 Pain in left hip: Secondary | ICD-10-CM | POA: Diagnosis not present

## 2022-03-11 DIAGNOSIS — I10 Essential (primary) hypertension: Secondary | ICD-10-CM

## 2022-03-11 DIAGNOSIS — R1032 Left lower quadrant pain: Secondary | ICD-10-CM | POA: Diagnosis not present

## 2022-03-11 DIAGNOSIS — I482 Chronic atrial fibrillation, unspecified: Secondary | ICD-10-CM | POA: Diagnosis not present

## 2022-03-11 DIAGNOSIS — M79652 Pain in left thigh: Secondary | ICD-10-CM | POA: Diagnosis not present

## 2022-03-11 DIAGNOSIS — E0800 Diabetes mellitus due to underlying condition with hyperosmolarity without nonketotic hyperglycemic-hyperosmolar coma (NKHHC): Secondary | ICD-10-CM

## 2022-03-11 DIAGNOSIS — R051 Acute cough: Secondary | ICD-10-CM | POA: Diagnosis not present

## 2022-03-11 DIAGNOSIS — M810 Age-related osteoporosis without current pathological fracture: Secondary | ICD-10-CM | POA: Diagnosis not present

## 2022-03-11 DIAGNOSIS — M159 Polyosteoarthritis, unspecified: Secondary | ICD-10-CM | POA: Diagnosis not present

## 2022-03-11 DIAGNOSIS — Z96642 Presence of left artificial hip joint: Secondary | ICD-10-CM | POA: Diagnosis not present

## 2022-03-11 DIAGNOSIS — R0781 Pleurodynia: Secondary | ICD-10-CM | POA: Diagnosis not present

## 2022-03-11 DIAGNOSIS — R059 Cough, unspecified: Secondary | ICD-10-CM | POA: Insufficient documentation

## 2022-03-11 DIAGNOSIS — W19XXXA Unspecified fall, initial encounter: Secondary | ICD-10-CM | POA: Diagnosis not present

## 2022-03-11 NOTE — Assessment & Plan Note (Signed)
heart rate is in control, takes ASA '81mg'$  qd, Hgb 13.7 03/05/22

## 2022-03-11 NOTE — Assessment & Plan Note (Signed)
Dysphagia III diet.  

## 2022-03-11 NOTE — Assessment & Plan Note (Signed)
taking Quetiapine, Mirtazapine, Depakote, increased anxiety, irritation, and restlessness .

## 2022-03-11 NOTE — Assessment & Plan Note (Signed)
takes Prolia, Ca, Vit D 

## 2022-03-11 NOTE — Assessment & Plan Note (Signed)
Healed subacute pelvic-right superior pubic ramus fx, T12, L1 Prn Tramadol/Tylenol,  Gabapentin for pain.

## 2022-03-11 NOTE — Assessment & Plan Note (Signed)
Blood pressure is controlled,  takes Cozaar, Bun/creat 40/1.1 03/05/22

## 2022-03-11 NOTE — Assessment & Plan Note (Signed)
better, after Furosemide 7 days started 03/09/22, pending BMP one week.

## 2022-03-11 NOTE — Assessment & Plan Note (Signed)
03/11/22 c/o left groin pain, pressed and standing up/weight bearing, multiple falls in the recent past, will obtain X-ray pelvis, left hip, left femur. Also c/o left rib cage pain under the left scapula, obtain X-ray left ribs too.

## 2022-03-11 NOTE — Assessment & Plan Note (Signed)
Na 133 03/05/22

## 2022-03-11 NOTE — Progress Notes (Signed)
Location:  [facility] Room Number: NO/35/A Place of Service:  SNF (31) Provider:  Leighanna Kirn X, NP Virgie Dad, MD  Patient Care Team: Virgie Dad, MD as PCP - General (Internal Medicine)  Extended Emergency Contact Information Primary Emergency Contact: [place] Mobile Phone: [phone] Relation: Grandson Interpreter needed? No Secondary Emergency Contact: [place] Mobile Phone: [phone] Relation: Granddaughter  Code Status:  DNR Goals of care: Advanced Directive information    03/09/2022    1:42 PM  Advanced Directives  Does Patient Have a Medical Advance Directive? Yes  Type of Paramedic of [place];Living will;Out of facility DNR (pink MOST or yellow form)  Does patient want to make changes to medical advance directive? No - Patient declined  Copy of [place] in Chart? Yes - validated most recent copy scanned in chart (See row information)  Pre-existing out of facility DNR order (yellow form or pink MOST form) Pink MOST form placed in chart (order not valid for inpatient use);Yellow form placed in chart (order not valid for inpatient use)     Chief Complaint  Patient presents with  . Acute Visit    Patient is being seen for left groin pain    HPI:  Pt is a 88 y.o. female seen today for an acute visit for c/o left chest wall pain under the left scapula, palpated. C/o left groin pain when standing up/weight bearing, also palpated.      Fall 03/04/22, a small bump noted right side of head.              Fall 03/02/22 ED eval, CT head no acute process, left frontal hematoma             Fall 02/15/22    Acute cough, better, after Furosemide 7 days started 03/09/22, pending BMP one week.  Healed subacute pelvic-right superior pubic ramus fx, T12, L1 Prn Tramadol/Tylenol,  Gabapentin for pain.             Dysphagia III diet             Dementia, intermittent agitation              Chronic Afib, heart rate is in control, takes ASA '81mg'$  qd, Hgb 13.7 03/05/22             HTN, takes Cozaar, Bun/creat 40/1.1 03/05/22             T2DM, on diet, Hgb a1c 6.8 10/11/21             OP, takes Prolia, Ca, Vit D             Depression/Anxiety, taking Quetiapine, Mirtazapine, Depakote, increased anxiety, irritation, and restlessness .  Hyponatremia, Na 133 03/05/22 Past Medical History:  Diagnosis Date  . Anxiety   . Arthritis   . Cancer ([place])    hx of skin cancer removal on hand   . Colitis   . Diabetes mellitus without complication ([place])   . Gallstones   . Hyperlipidemia   . Hypertension   . IBS (irritable bowel syndrome)   . Osteoporosis    Past Surgical History:  Procedure Laterality Date  . APPENDECTOMY    . BACK SURGERY     x2  . cataract surgery      bilateral   . CHOLECYSTECTOMY  1999  . EYE SURGERY     for glaucoma   . left eye peeling surgery     .  left femur bone surgery      1978  . REVERSE SHOULDER ARTHROPLASTY Right 10/10/2014   Procedure: RIGHT REVERSE SHOULDER ARTHROPLASTY;  Surgeon: Justice Britain, MD;  Location: [facility];  Service: Orthopedics;  Laterality: Right;  . ROTATOR CUFF REPAIR    . TOTAL HIP ARTHROPLASTY Left 02/20/2014   Procedure: LEFT TOTAL HIP ARTHROPLASTY ANTERIOR APPROACH;  Surgeon: Gearlean Alf, MD;  Location: [facility];  Service: Orthopedics;  Laterality: Left;  . WRIST SURGERY     left     Allergies  Allergen Reactions  . Robaxin [Methocarbamol] Rash and Other (See Comments)    Red rash on right side of face and forehead cleared after benadryl given, per [name], RN  . Ancef [Cefazolin] Diarrhea  . Dilaudid [Hydromorphone] Other (See Comments)    Stomach upset  . Fosamax [Alendronate] Other (See Comments)    Upset stomach  . Hyoscyamine Other (See Comments)    "Caused gas"  . Morphine And Related Itching  . Nsaids Diarrhea  . Parathyroid Hormone (Recomb) Other (See Comments)    was not able to give herself the shot  .  Penicillins Itching    Tolerated Unasyn Has patient had a PCN reaction causing immediate rash, facial/tongue/throat swelling, SOB or lightheadedness with hypotension: Yes Has patient had a PCN reaction causing severe rash involving mucus membranes or skin necrosis: No Has patient had a PCN reaction that required hospitalization: Was in hospital Has patient had a PCN reaction occurring within the last 10 years: No If all of the above answers are "NO", then may proceed with Cephalosporin use.   . Tape Other (See Comments)    Bandages and leads cause bruising and weeping at sites  . Flurbiprofen Diarrhea    Outpatient Encounter Medications as of 03/11/2022  Medication Sig  . acetaminophen (TYLENOL) 500 MG tablet Take 2 tablets (1,000 mg total) by mouth 3 (three) times daily as needed.  Marland Kitchen aspirin EC 81 MG tablet Take 81 mg by mouth daily.  . calcium carbonate (OSCAL) 1500 (600 Ca) MG TABS tablet Take 600 mg of elemental calcium by mouth daily.  . Cholecalciferol 25 MCG (1000 UT) tablet Take 1,000 Units by mouth daily.  Marland Kitchen denosumab (PROLIA) 60 MG/ML SOSY injection Inject 60 mg into the skin every 6 (six) months. Last injection 06/02/21  . divalproex (DEPAKOTE SPRINKLE) 125 MG capsule Take 125 mg by mouth 2 (two) times daily.  Marland Kitchen docusate sodium 100 MG CAPS Take 100 mg by mouth 2 (two) times daily.  Marland Kitchen gabapentin (NEURONTIN) 100 MG capsule Take 200 mg by mouth 3 (three) times daily.  Marland Kitchen lactose free nutrition (BOOST) LIQD Take 237 mLs by mouth 2 (two) times daily between meals.  Marland Kitchen loratadine (CLARITIN) 10 MG tablet Take 10 mg by mouth daily as needed for allergies.  Marland Kitchen LORazepam (ATIVAN) 0.5 MG tablet Take 0.5 mg by mouth every 6 (six) hours as needed for anxiety.  Marland Kitchen losartan (COZAAR) 50 MG tablet Take 1 tablet (50 mg total) by mouth daily.  Marland Kitchen MELATONIN PO Take 3 mg by mouth at bedtime as needed (sleep).  . mirtazapine (REMERON) 15 MG tablet Take 15 mg by mouth at bedtime.  . Multiple  Vitamins-Minerals (PRESERVISION AREDS 2) CAPS Take 1 capsule by mouth daily.  . QUEtiapine (SEROQUEL) 25 MG tablet Take 12.5 mg by mouth. 12.5 in the morning and 12.5 at bedtime  . traMADol (ULTRAM) 50 MG tablet Take 0.5 tablets (25 mg total) by mouth every 8 (eight) hours  as needed.   No facility-administered encounter medications on file as of 03/11/2022.    Review of Systems  Constitutional:  Negative for appetite change, fatigue and fever.  HENT:  Positive for hearing loss and trouble swallowing. Negative for congestion.   Eyes:  Positive for visual disturbance.       Blind right eye.   Respiratory:  Positive for cough. Negative for shortness of breath.   Cardiovascular:  Negative for leg swelling.  Gastrointestinal:  Negative for abdominal pain and constipation.  Genitourinary:  Positive for frequency. Negative for dysuria and urgency.  Musculoskeletal:  Positive for arthralgias, back pain and gait problem.       Left hip region pain, able to bear weight. Left chest wall pain palpated under the left scapula  Skin:  Positive for wound.  Neurological:  Negative for speech difficulty, weakness and headaches.       Memory difficulty.   Psychiatric/Behavioral:  Positive for behavioral problems, confusion and sleep disturbance. The patient is nervous/anxious.     Immunization History  Administered Date(s) Administered  . Influenza Split 07/04/2009, 04/09/2010, 05/06/2011, 03/29/2012, 05/03/2013  . Influenza, High Dose Seasonal PF 05/02/2013, 05/25/2017, 04/26/2018, 04/24/2019  . Influenza, Quadrivalent, Recombinant, Inj, Pf 04/26/2020  . Influenza,inj,Quad PF,6+ Mos 05/26/2021  . Influenza,inj,quad, With Preservative 04/18/2017  . Influenza-Unspecified 05/12/2015  . PFIZER(Purple Top)SARS-COV-2 Vaccination 08/25/2019, 09/19/2019  . Pension scheme manager 59yr & up 05/01/2020, 01/20/2021, 10/27/2021  . Pneumococcal Conjugate-13 01/29/2014  . Pneumococcal  Polysaccharide-23 06/07/2007  . Td 05/02/2003  . Tdap 10/18/2011  . Zoster, Live 01/17/2006   Pertinent  Health Maintenance Due  Topic Date Due  . FOOT EXAM  Never done  . OPHTHALMOLOGY EXAM  Never done  . DEXA SCAN  Never done  . INFLUENZA VACCINE  02/16/2022  . HEMOGLOBIN A1C  04/13/2022      10/15/2021    7:42 AM 10/16/2021   12:00 AM 10/16/2021    9:00 AM 12/10/2021    4:03 PM 03/02/2022   11:50 PM  Fall Risk  Patient Fall Risk Level High fall risk High fall risk High fall risk High fall risk High fall risk   Functional Status Survey:    Vitals:   03/11/22 1408  BP: (!) 120/58  Pulse: 60  Resp: 18  Temp: (!) 97.3 F (36.3 C)  SpO2: 98%  Weight: 139 lb (63 kg)  Height: '5\' 2"'$  (1.575 m)   Body mass index is 25.42 kg/m. Physical Exam Nursing note reviewed.  Constitutional:      Appearance: Normal appearance.  HENT:     Head: Normocephalic and atraumatic.     Nose: Nose normal.     Mouth/Throat:     Mouth: Mucous membranes are moist.  Eyes:     Extraocular Movements: Extraocular movements intact.     Conjunctiva/sclera: Conjunctivae normal.     Pupils: Pupils are equal, round, and reactive to light.     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   Cardiovascular:     Rate and Rhythm: Normal rate and regular rhythm.     Heart sounds: No murmur heard. Pulmonary:     Effort: Pulmonary effort is normal.     Breath sounds: No wheezing or rales.  Abdominal:     General: Bowel sounds are normal.     Palpations: Abdomen is soft.     Tenderness: There is no abdominal tenderness.  Musculoskeletal:        General: Tenderness present.  Cervical back: Normal range of motion and neck supple.     Right lower leg: No edema.     Left lower leg: No edema.     Comments: Left hip region pain after fall this morning, able to bear weight and walk. Left chest wall pain under the left scapula.   Skin:    General: Skin is warm and dry.     Findings: Bruising  present.     Comments: Left frontal hematoma, a small lump right sided of head.   Neurological:     General: No focal deficit present.     Mental Status: She is alert. Mental status is at baseline.     Motor: No weakness.     Coordination: Coordination normal.     Gait: Gait abnormal.     Comments: Oriented to person  Psychiatric:        Mood and Affect: Mood normal.    Labs reviewed: Recent Labs    10/15/21 0640 10/16/21 0328 10/23/21 0000 12/10/21 1601 12/10/21 1823 12/29/21 0000 02/02/22 0000 03/05/22 0000  NA 133* 137   < > 137 134* 135* 140 133*  K 3.1* 4.0   < > 5.3* 5.1 4.9 3.6 5.0  CL 101 111   < > 100 101 99 101 97*  CO2 23 21*   < > 27 27 30* 30* 23*  GLUCOSE 118* 105*  --  99 112*  --   --   --   BUN 11 15   < > 38* 35* 30* 40* 40*  CREATININE 0.60 0.61   < > 0.95 0.91 1.0 1.2* 1.1  CALCIUM 8.7* 8.4*   < > 9.4 9.0 9.8 9.9 9.5  MG 1.7  --   --   --   --   --   --   --   PHOS 2.7  --   --   --   --   --   --   --    < > = values in this interval not displayed.   Recent Labs    10/11/21 0325 10/12/21 0322 10/23/21 0000 12/10/21 1601 12/29/21 0000 03/05/22 0000  AST 49* 39   < > '15 18 29  '$ ALT 25 24   < > '11 12 25  '$ ALKPHOS 41 34*   < > 42 56 61  BILITOT 0.6 0.4  --  0.4  --   --   PROT 5.8* 5.1*  --  6.8  --   --   ALBUMIN 3.1* 2.7*   < > 2.9* 3.7 4.0   < > = values in this interval not displayed.   Recent Labs    10/13/21 0704 10/16/21 0328 10/23/21 0000 12/10/21 1601 12/29/21 0000 03/05/22 0000  WBC 9.4 9.2   < > 11.1* 8.0 7.4  NEUTROABS  --   --    < > 7.3 3,584.00 4,544.00  HGB 11.6* 12.0   < > 10.4* 12.1 13.7  HCT 35.5* 36.7   < > 32.2* 37 41  MCV 96.7 94.8  --  98.2  --   --   PLT 282 365   < > 363 335 289   < > = values in this interval not displayed.   No results found for: "TSH" Lab Results  Component Value Date   HGBA1C 6.8 (H) 10/11/2021   No results found for: "CHOL", "HDL", "LDLCALC", "LDLDIRECT", "TRIG",  "CHOLHDL"  Significant Diagnostic Results in last 30 days:  CT Head  Wo Contrast  Addendum Date: 03/03/2022   ADDENDUM REPORT: 03/03/2022 02:06 ADDENDUM: These results were called by telephone at the time of interpretation on 03/03/2022 at 1:13 am to provider [facility] , who verbally acknowledged these results. Electronically Signed   By: Placido Sou M.D.   On: 03/03/2022 02:06   Result Date: 03/03/2022 CLINICAL DATA:  Fall from sitting position, hit left forehead EXAM: CT HEAD WITHOUT CONTRAST CT CERVICAL SPINE WITHOUT CONTRAST TECHNIQUE: Multidetector CT imaging of the head and cervical spine was performed following the standard protocol without intravenous contrast. Multiplanar CT image reconstructions of the cervical spine were also generated. RADIATION DOSE REDUCTION: This exam was performed according to the departmental dose-optimization program which includes automated exposure control, adjustment of the mA and/or kV according to patient size and/or use of iterative reconstruction technique. COMPARISON:  CT 12/10/2021 FINDINGS: CT HEAD FINDINGS Brain: No intracranial hemorrhage, mass effect, or evidence of acute infarct. No hydrocephalus. No extra-axial fluid collection. Generalized cerebral atrophy. Ill-defined hypoattenuation within the cerebral white matter is nonspecific but consistent with chronic small vessel ischemic disease. Vascular: No hyperdense vessel. Calcification of the intracranial internal carotid arteries. Skull: 2.9 x 1.0 cm scalp hematoma overlying the left frontal calvarium. No underlying calvarial fracture Sinuses/Orbits: No acute finding. Paranasal sinuses and mastoid air cells are well aerated. Other: None. CT CERVICAL SPINE FINDINGS Alignment: Multiple low-grade vertebral body subluxations are unchanged. Skull base and vertebrae: No acute fracture. No primary bone lesion or focal pathologic process. Inferior endplate deformities of T1 and T2 are unchanged. Soft tissues  and spinal canal: No prevertebral fluid or swelling. No visible canal hematoma. Disc levels: Multilevel advanced spondylosis, disc space height loss and degenerative endplate changes and facet arthropathy greatest from C5-C7. Posterior disc osteophyte complexes cause multilevel spinal canal narrowing greatest at C7-T1 where it is mild-moderate in combination with ligamentum flavum calcifications. Uncovertebral spurring and facet arthropathy cause multilevel neural foraminal narrowing that is advanced at C3-C4 bilaterally, on the right at C4-C5, and bilaterally at C5-C6. These findings are not significantly changed from prior. Upper chest: 1.8 cm nodule in the left apex is partially visualized. Interlobular septal thickening and patchy ground-glass opacities in the apices. Other: None. IMPRESSION: 1. 1. No acute intracranial abnormality. Generalized atrophy and small vessel white matter disease. 2. Scalp hematoma overlying the left frontal calvarium. No underlying fracture. 3. No acute fracture in the cervical spine. Multilevel degenerative spondylosis. 4. 1.8 cm pulmonary nodule is partially visualized in the left apex. This may be infectious/inflammatory however is incompletely evaluated on the current exam. Follow-up evaluation with nonemergent chest CT is recommended. 5. Pulmonary edema in the lung apices. Electronically Signed: By: Placido Sou M.D. On: 03/03/2022 01:07   CT Cervical Spine Wo Contrast  Addendum Date: 03/03/2022   ADDENDUM REPORT: 03/03/2022 02:06 ADDENDUM: These results were called by telephone at the time of interpretation on 03/03/2022 at 1:13 am to provider COURTNEY HORTON , who verbally acknowledged these results. Electronically Signed   By: Placido Sou M.D.   On: 03/03/2022 02:06   Result Date: 03/03/2022 CLINICAL DATA:  Fall from sitting position, hit left forehead EXAM: CT HEAD WITHOUT CONTRAST CT CERVICAL SPINE WITHOUT CONTRAST TECHNIQUE: Multidetector CT imaging of the head  and cervical spine was performed following the standard protocol without intravenous contrast. Multiplanar CT image reconstructions of the cervical spine were also generated. RADIATION DOSE REDUCTION: This exam was performed according to the departmental dose-optimization program which includes automated exposure control, adjustment of the mA  and/or kV according to patient size and/or use of iterative reconstruction technique. COMPARISON:  CT 12/10/2021 FINDINGS: CT HEAD FINDINGS Brain: No intracranial hemorrhage, mass effect, or evidence of acute infarct. No hydrocephalus. No extra-axial fluid collection. Generalized cerebral atrophy. Ill-defined hypoattenuation within the cerebral white matter is nonspecific but consistent with chronic small vessel ischemic disease. Vascular: No hyperdense vessel. Calcification of the intracranial internal carotid arteries. Skull: 2.9 x 1.0 cm scalp hematoma overlying the left frontal calvarium. No underlying calvarial fracture Sinuses/Orbits: No acute finding. Paranasal sinuses and mastoid air cells are well aerated. Other: None. CT CERVICAL SPINE FINDINGS Alignment: Multiple low-grade vertebral body subluxations are unchanged. Skull base and vertebrae: No acute fracture. No primary bone lesion or focal pathologic process. Inferior endplate deformities of T1 and T2 are unchanged. Soft tissues and spinal canal: No prevertebral fluid or swelling. No visible canal hematoma. Disc levels: Multilevel advanced spondylosis, disc space height loss and degenerative endplate changes and facet arthropathy greatest from C5-C7. Posterior disc osteophyte complexes cause multilevel spinal canal narrowing greatest at C7-T1 where it is mild-moderate in combination with ligamentum flavum calcifications. Uncovertebral spurring and facet arthropathy cause multilevel neural foraminal narrowing that is advanced at C3-C4 bilaterally, on the right at C4-C5, and bilaterally at C5-C6. These findings are  not significantly changed from prior. Upper chest: 1.8 cm nodule in the left apex is partially visualized. Interlobular septal thickening and patchy ground-glass opacities in the apices. Other: None. IMPRESSION: 1. 1. No acute intracranial abnormality. Generalized atrophy and small vessel white matter disease. 2. Scalp hematoma overlying the left frontal calvarium. No underlying fracture. 3. No acute fracture in the cervical spine. Multilevel degenerative spondylosis. 4. 1.8 cm pulmonary nodule is partially visualized in the left apex. This may be infectious/inflammatory however is incompletely evaluated on the current exam. Follow-up evaluation with nonemergent chest CT is recommended. 5. Pulmonary edema in the lung apices. Electronically Signed: By: Placido Sou M.D. On: 03/03/2022 01:07    Assessment/Plan Left groin pain 03/11/22 c/o left groin pain, pressed and standing up/weight bearing, multiple falls in the recent past, will obtain X-ray pelvis, left hip, left femur. Also c/o left rib cage pain under the left scapula, obtain X-ray left ribs too.   Frequent falls Fall 03/04/22, a small bump noted right side of head.              Fall 03/02/22 ED eval, CT head no acute process, left frontal hematoma             Fall 02/15/22   Cough  better, after Furosemide 7 days started 03/09/22, pending BMP one week.   Osteoarthritis, multiple sites Healed subacute pelvic-right superior pubic ramus fx, T12, L1 Prn Tramadol/Tylenol,  Gabapentin for pain.  Dysphagia  Dysphagia III diet  Dementia ([facility]) intermittent agitation  Chronic a-fib ([facility]) heart rate is in control, takes ASA '81mg'$  qd, Hgb 13.7 03/05/22  Essential hypertension Blood pressure is controlled,  takes Cozaar, Bun/creat 40/1.1 03/05/22  Diabetes ([place]) on diet, Hgb a1c 6.8 10/11/21  Osteoporosis takes Prolia, Ca, Vit D  Anxiety due to dementia ([place]) taking Quetiapine, Mirtazapine, Depakote, increased anxiety, irritation, and  restlessness .  Hyponatremia  Na 133 03/05/22     Family/ staff Communication: plan of care reviewed with the patient and charge nurse.   Labs/tests ordered:  X-ray left ribs, pelvis, left hip/femur  Time spend 35 minutes.

## 2022-03-11 NOTE — Assessment & Plan Note (Signed)
intermittent agitation

## 2022-03-11 NOTE — Assessment & Plan Note (Signed)
on diet, Hgb a1c 6.8 10/11/21

## 2022-03-11 NOTE — Assessment & Plan Note (Signed)
Fall 03/04/22, a small bump noted right side of head.              Fall 03/02/22 ED eval, CT head no acute process, left frontal hematoma             Fall 02/15/22

## 2022-03-12 ENCOUNTER — Encounter: Payer: Self-pay | Admitting: Nurse Practitioner

## 2022-03-12 ENCOUNTER — Non-Acute Institutional Stay (SKILLED_NURSING_FACILITY): Payer: Medicare Other | Admitting: Nurse Practitioner

## 2022-03-12 DIAGNOSIS — M159 Polyosteoarthritis, unspecified: Secondary | ICD-10-CM

## 2022-03-12 DIAGNOSIS — I482 Chronic atrial fibrillation, unspecified: Secondary | ICD-10-CM

## 2022-03-12 DIAGNOSIS — R1032 Left lower quadrant pain: Secondary | ICD-10-CM

## 2022-03-12 DIAGNOSIS — F03C11 Unspecified dementia, severe, with agitation: Secondary | ICD-10-CM | POA: Diagnosis not present

## 2022-03-12 DIAGNOSIS — R1310 Dysphagia, unspecified: Secondary | ICD-10-CM

## 2022-03-12 DIAGNOSIS — M810 Age-related osteoporosis without current pathological fracture: Secondary | ICD-10-CM | POA: Diagnosis not present

## 2022-03-12 DIAGNOSIS — E871 Hypo-osmolality and hyponatremia: Secondary | ICD-10-CM | POA: Diagnosis not present

## 2022-03-12 DIAGNOSIS — I10 Essential (primary) hypertension: Secondary | ICD-10-CM | POA: Diagnosis not present

## 2022-03-12 DIAGNOSIS — E0800 Diabetes mellitus due to underlying condition with hyperosmolarity without nonketotic hyperglycemic-hyperosmolar coma (NKHHC): Secondary | ICD-10-CM

## 2022-03-12 DIAGNOSIS — F0394 Unspecified dementia, unspecified severity, with anxiety: Secondary | ICD-10-CM | POA: Diagnosis not present

## 2022-03-12 DIAGNOSIS — R051 Acute cough: Secondary | ICD-10-CM | POA: Diagnosis not present

## 2022-03-12 NOTE — Assessment & Plan Note (Signed)
heart rate is in control, takes ASA '81mg'$  qd, Hgb 13.7 03/05/22

## 2022-03-12 NOTE — Assessment & Plan Note (Signed)
takes Prolia, Ca, Vit D 

## 2022-03-12 NOTE — Progress Notes (Unsigned)
Location:   [facility] Room Number: 35 Place of Service:  SNF (31) Provider:  Mast, Man X, NP  Virgie Dad, MD  Patient Care Team: Virgie Dad, MD as PCP - General (Internal Medicine)  Extended Emergency Contact Information Primary Emergency Contact: [place] Mobile Phone: [phone] Relation: Grandson Interpreter needed? No Secondary Emergency Contact: [place] Mobile Phone: [phone] Relation: Granddaughter  Code Status:  DNR Goals of care: Advanced Directive information    03/12/2022    3:15 PM  Advanced Directives  Does Patient Have a Medical Advance Directive? Yes  Type of Paramedic of [place];Out of facility DNR (pink MOST or yellow form);Living will  Does patient want to make changes to medical advance directive? No - Patient declined  Copy of [place] in Chart? Yes - validated most recent copy scanned in chart (See row information)  Pre-existing out of facility DNR order (yellow form or pink MOST form) Yellow form placed in chart (order not valid for inpatient use)     Chief Complaint  Patient presents with   Acute Visit    Cough    HPI:  Pt is a 88 y.o. female seen today for medical management of chronic diseases.     Past Medical History:  Diagnosis Date   Anxiety    Arthritis    Cancer ([place])    hx of skin cancer removal on hand    Colitis    Diabetes mellitus without complication ([place])    Gallstones    Hyperlipidemia    Hypertension    IBS (irritable bowel syndrome)    Osteoporosis    Past Surgical History:  Procedure Laterality Date   APPENDECTOMY     BACK SURGERY     x2   cataract surgery      bilateral    [place]     for glaucoma    left eye peeling surgery      left femur bone surgery      1978   REVERSE SHOULDER ARTHROPLASTY Right 10/10/2014   Procedure: RIGHT REVERSE SHOULDER ARTHROPLASTY;  Surgeon: Justice Britain, MD;  Location: [facility];  Service: Orthopedics;  Laterality: Right;   ROTATOR CUFF REPAIR     TOTAL HIP ARTHROPLASTY Left 02/20/2014   Procedure: LEFT TOTAL HIP ARTHROPLASTY ANTERIOR APPROACH;  Surgeon: Gearlean Alf, MD;  Location: [facility];  Service: Orthopedics;  Laterality: Left;   WRIST SURGERY     left     Allergies  Allergen Reactions   Robaxin [Methocarbamol] Rash and Other (See Comments)    Red rash on right side of face and forehead cleared after benadryl given, per [name], RN   Ancef [Cefazolin] Diarrhea   Dilaudid [Hydromorphone] Other (See Comments)    Stomach upset   Fosamax [Alendronate] Other (See Comments)    Upset stomach   Hyoscyamine Other (See Comments)    "Caused gas"   Morphine And Related Itching   Nsaids Diarrhea   Parathyroid Hormone (Recomb) Other (See Comments)    was not able to give herself the shot   Penicillins Itching    Tolerated Unasyn Has patient had a PCN reaction causing immediate rash, facial/tongue/throat swelling, SOB or lightheadedness with hypotension: Yes Has patient had a PCN reaction causing severe rash involving mucus membranes or skin necrosis: No Has patient had a PCN reaction that required hospitalization: Was in hospital Has patient had a PCN reaction occurring within the  last 10 years: No If all of the above answers are "NO", then may proceed with Cephalosporin use.    Tape Other (See Comments)    Bandages and leads cause bruising and weeping at sites   Flurbiprofen Diarrhea    Allergies as of 03/12/2022       Reactions   Robaxin [methocarbamol] Rash, Other (See Comments)   Red rash on right side of face and forehead cleared after benadryl given, per [name], RN   Ancef [cefazolin] Diarrhea   Dilaudid [hydromorphone] Other (See Comments)   Stomach upset   Fosamax [alendronate] Other (See Comments)   Upset stomach   Hyoscyamine Other (See Comments)   "Caused gas"   Morphine And Related Itching   Nsaids Diarrhea    Parathyroid Hormone (recomb) Other (See Comments)   was not able to give herself the shot   Penicillins Itching   Tolerated Unasyn Has patient had a PCN reaction causing immediate rash, facial/tongue/throat swelling, SOB or lightheadedness with hypotension: Yes Has patient had a PCN reaction causing severe rash involving mucus membranes or skin necrosis: No Has patient had a PCN reaction that required hospitalization: Was in hospital Has patient had a PCN reaction occurring within the last 10 years: No If all of the above answers are "NO", then may proceed with Cephalosporin use.   Tape Other (See Comments)   Bandages and leads cause bruising and weeping at sites   Flurbiprofen Diarrhea        Medication List        Accurate as of March 12, 2022  3:16 PM. If you have any questions, ask your nurse or doctor.          acetaminophen 500 MG tablet Commonly known as: TYLENOL Take 2 tablets (1,000 mg total) by mouth 3 (three) times daily as needed.   aspirin EC 81 MG tablet Take 81 mg by mouth daily.   calcium carbonate 1500 (600 Ca) MG Tabs tablet Commonly known as: OSCAL Take 600 mg of elemental calcium by mouth daily.   Cholecalciferol 25 MCG (1000 UT) tablet Take 1,000 Units by mouth daily.   denosumab 60 MG/ML Sosy injection Commonly known as: PROLIA Inject 60 mg into the skin every 6 (six) months. Last injection 06/02/21   divalproex 125 MG capsule Commonly known as: DEPAKOTE SPRINKLE Take 125 mg by mouth 2 (two) times daily.   DSS 100 MG Caps Take 100 mg by mouth 2 (two) times daily.   furosemide 20 MG tablet Commonly known as: LASIX Take 20 mg by mouth daily.   gabapentin 100 MG capsule Commonly known as: NEURONTIN Take 200 mg by mouth 3 (three) times daily.   lactose free nutrition Liqd Take 237 mLs by mouth 2 (two) times daily between meals.   loratadine 10 MG tablet Commonly known as: CLARITIN Take 10 mg by mouth daily as needed for allergies.    LORazepam 0.5 MG tablet Commonly known as: ATIVAN Take 0.5 mg by mouth every 6 (six) hours as needed for anxiety.   losartan 50 MG tablet Commonly known as: COZAAR Take 1 tablet (50 mg total) by mouth daily.   MELATONIN PO Take 3 mg by mouth at bedtime as needed (sleep).   mirtazapine 15 MG tablet Commonly known as: REMERON Take 15 mg by mouth at bedtime.   potassium chloride 10 MEQ tablet Commonly known as: KLOR-CON M Take 10 mEq by mouth 2 (two) times daily.   PreserVision AREDS 2 Caps Take 1 capsule by  mouth daily.   QUEtiapine 25 MG tablet Commonly known as: SEROQUEL Take 12.5 mg by mouth. 12.5 in the morning and 12.5 at bedtime   traMADol 50 MG tablet Commonly known as: ULTRAM Take 0.5 tablets (25 mg total) by mouth every 8 (eight) hours as needed.        Review of Systems  Immunization History  Administered Date(s) Administered   Influenza Split 07/04/2009, 04/09/2010, 05/06/2011, 03/29/2012, 05/03/2013   Influenza, High Dose Seasonal PF 05/02/2013, 05/25/2017, 04/26/2018, 04/24/2019   Influenza, Quadrivalent, Recombinant, Inj, Pf 04/26/2020   Influenza,inj,Quad PF,6+ Mos 05/26/2021   Influenza,inj,quad, With Preservative 04/18/2017   Influenza-Unspecified 05/12/2015   PFIZER(Purple Top)SARS-COV-2 Vaccination 08/25/2019, 09/19/2019   Pfizer Covid-19 Vaccine Bivalent Booster 18yr & up 05/01/2020, 01/20/2021, 10/27/2021   Pneumococcal Conjugate-13 01/29/2014   Pneumococcal Polysaccharide-23 06/07/2007   Td 05/02/2003   Tdap 10/18/2011   Zoster, Live 01/17/2006   Pertinent  Health Maintenance Due  Topic Date Due   FOOT EXAM  Never done   OPHTHALMOLOGY EXAM  Never done   DEXA SCAN  Never done   INFLUENZA VACCINE  02/16/2022   HEMOGLOBIN A1C  04/13/2022      10/15/2021    7:42 AM 10/16/2021   12:00 AM 10/16/2021    9:00 AM 12/10/2021    4:03 PM 03/02/2022   11:50 PM  Fall Risk  Patient Fall Risk Level High fall risk High fall risk High fall risk High  fall risk High fall risk   Functional Status Survey:    Vitals:   03/12/22 1503  BP: (!) 122/58  Pulse: 60  Resp: 18  Temp: (!) 97.2 F (36.2 C)  SpO2: 94%  Weight: 139 lb 14.4 oz (63.5 kg)  Height: '5\' 2"'$  (1.575 m)   Body mass index is 25.59 kg/m. Physical Exam  Labs reviewed: Recent Labs    10/15/21 0640 10/16/21 0328 10/23/21 0000 12/10/21 1601 12/10/21 1823 12/29/21 0000 02/02/22 0000 03/05/22 0000  NA 133* 137   < > 137 134* 135* 140 133*  K 3.1* 4.0   < > 5.3* 5.1 4.9 3.6 5.0  CL 101 111   < > 100 101 99 101 97*  CO2 23 21*   < > 27 27 30* 30* 23*  GLUCOSE 118* 105*  --  99 112*  --   --   --   BUN 11 15   < > 38* 35* 30* 40* 40*  CREATININE 0.60 0.61   < > 0.95 0.91 1.0 1.2* 1.1  CALCIUM 8.7* 8.4*   < > 9.4 9.0 9.8 9.9 9.5  MG 1.7  --   --   --   --   --   --   --   PHOS 2.7  --   --   --   --   --   --   --    < > = values in this interval not displayed.   Recent Labs    10/11/21 0325 10/12/21 0322 10/23/21 0000 12/10/21 1601 12/29/21 0000 03/05/22 0000  AST 49* 39   < > '15 18 29  '$ ALT 25 24   < > '11 12 25  '$ ALKPHOS 41 34*   < > 42 56 61  BILITOT 0.6 0.4  --  0.4  --   --   PROT 5.8* 5.1*  --  6.8  --   --   ALBUMIN 3.1* 2.7*   < > 2.9* 3.7 4.0   < > = values  in this interval not displayed.   Recent Labs    10/13/21 0704 10/16/21 0328 10/23/21 0000 12/10/21 1601 12/29/21 0000 03/05/22 0000  WBC 9.4 9.2   < > 11.1* 8.0 7.4  NEUTROABS  --   --    < > 7.3 3,584.00 4,544.00  HGB 11.6* 12.0   < > 10.4* 12.1 13.7  HCT 35.5* 36.7   < > 32.2* 37 41  MCV 96.7 94.8  --  98.2  --   --   PLT 282 365   < > 363 335 289   < > = values in this interval not displayed.   No results found for: "TSH" Lab Results  Component Value Date   HGBA1C 6.8 (H) 10/11/2021   No results found for: "CHOL", "HDL", "LDLCALC", "LDLDIRECT", "TRIG", "CHOLHDL"  Significant Diagnostic Results in last 30 days:  CT Head Wo Contrast  Addendum Date: 03/03/2022   ADDENDUM  REPORT: 03/03/2022 02:06 ADDENDUM: These results were called by telephone at the time of interpretation on 03/03/2022 at 1:13 am to provider [place] , who verbally acknowledged these results. Electronically Signed   By: Placido Sou M.D.   On: 03/03/2022 02:06   Result Date: 03/03/2022 CLINICAL DATA:  Fall from sitting position, hit left forehead EXAM: CT HEAD WITHOUT CONTRAST CT CERVICAL SPINE WITHOUT CONTRAST TECHNIQUE: Multidetector CT imaging of the head and cervical spine was performed following the standard protocol without intravenous contrast. Multiplanar CT image reconstructions of the cervical spine were also generated. RADIATION DOSE REDUCTION: This exam was performed according to the departmental dose-optimization program which includes automated exposure control, adjustment of the mA and/or kV according to patient size and/or use of iterative reconstruction technique. COMPARISON:  CT 12/10/2021 FINDINGS: CT HEAD FINDINGS Brain: No intracranial hemorrhage, mass effect, or evidence of acute infarct. No hydrocephalus. No extra-axial fluid collection. Generalized cerebral atrophy. Ill-defined hypoattenuation within the cerebral white matter is nonspecific but consistent with chronic small vessel ischemic disease. Vascular: No hyperdense vessel. Calcification of the intracranial internal carotid arteries. Skull: 2.9 x 1.0 cm scalp hematoma overlying the left frontal calvarium. No underlying calvarial fracture Sinuses/Orbits: No acute finding. Paranasal sinuses and mastoid air cells are well aerated. Other: None. CT CERVICAL SPINE FINDINGS Alignment: Multiple low-grade vertebral body subluxations are unchanged. Skull base and vertebrae: No acute fracture. No primary bone lesion or focal pathologic process. Inferior endplate deformities of T1 and T2 are unchanged. Soft tissues and spinal canal: No prevertebral fluid or swelling. No visible canal hematoma. Disc levels: Multilevel advanced  spondylosis, disc space height loss and degenerative endplate changes and facet arthropathy greatest from C5-C7. Posterior disc osteophyte complexes cause multilevel spinal canal narrowing greatest at C7-T1 where it is mild-moderate in combination with ligamentum flavum calcifications. Uncovertebral spurring and facet arthropathy cause multilevel neural foraminal narrowing that is advanced at C3-C4 bilaterally, on the right at C4-C5, and bilaterally at C5-C6. These findings are not significantly changed from prior. Upper chest: 1.8 cm nodule in the left apex is partially visualized. Interlobular septal thickening and patchy ground-glass opacities in the apices. Other: None. IMPRESSION: 1. 1. No acute intracranial abnormality. Generalized atrophy and small vessel white matter disease. 2. Scalp hematoma overlying the left frontal calvarium. No underlying fracture. 3. No acute fracture in the cervical spine. Multilevel degenerative spondylosis. 4. 1.8 cm pulmonary nodule is partially visualized in the left apex. This may be infectious/inflammatory however is incompletely evaluated on the current exam. Follow-up evaluation with nonemergent chest CT is recommended. 5. Pulmonary  edema in the lung apices. Electronically Signed: By: Placido Sou M.D. On: 03/03/2022 01:07   CT Cervical Spine Wo Contrast  Addendum Date: 03/03/2022   ADDENDUM REPORT: 03/03/2022 02:06 ADDENDUM: These results were called by telephone at the time of interpretation on 03/03/2022 at 1:13 am to provider COURTNEY HORTON , who verbally acknowledged these results. Electronically Signed   By: Placido Sou M.D.   On: 03/03/2022 02:06   Result Date: 03/03/2022 CLINICAL DATA:  Fall from sitting position, hit left forehead EXAM: CT HEAD WITHOUT CONTRAST CT CERVICAL SPINE WITHOUT CONTRAST TECHNIQUE: Multidetector CT imaging of the head and cervical spine was performed following the standard protocol without intravenous contrast. Multiplanar CT  image reconstructions of the cervical spine were also generated. RADIATION DOSE REDUCTION: This exam was performed according to the departmental dose-optimization program which includes automated exposure control, adjustment of the mA and/or kV according to patient size and/or use of iterative reconstruction technique. COMPARISON:  CT 12/10/2021 FINDINGS: CT HEAD FINDINGS Brain: No intracranial hemorrhage, mass effect, or evidence of acute infarct. No hydrocephalus. No extra-axial fluid collection. Generalized cerebral atrophy. Ill-defined hypoattenuation within the cerebral white matter is nonspecific but consistent with chronic small vessel ischemic disease. Vascular: No hyperdense vessel. Calcification of the intracranial internal carotid arteries. Skull: 2.9 x 1.0 cm scalp hematoma overlying the left frontal calvarium. No underlying calvarial fracture Sinuses/Orbits: No acute finding. Paranasal sinuses and mastoid air cells are well aerated. Other: None. CT CERVICAL SPINE FINDINGS Alignment: Multiple low-grade vertebral body subluxations are unchanged. Skull base and vertebrae: No acute fracture. No primary bone lesion or focal pathologic process. Inferior endplate deformities of T1 and T2 are unchanged. Soft tissues and spinal canal: No prevertebral fluid or swelling. No visible canal hematoma. Disc levels: Multilevel advanced spondylosis, disc space height loss and degenerative endplate changes and facet arthropathy greatest from C5-C7. Posterior disc osteophyte complexes cause multilevel spinal canal narrowing greatest at C7-T1 where it is mild-moderate in combination with ligamentum flavum calcifications. Uncovertebral spurring and facet arthropathy cause multilevel neural foraminal narrowing that is advanced at C3-C4 bilaterally, on the right at C4-C5, and bilaterally at C5-C6. These findings are not significantly changed from prior. Upper chest: 1.8 cm nodule in the left apex is partially visualized.  Interlobular septal thickening and patchy ground-glass opacities in the apices. Other: None. IMPRESSION: 1. 1. No acute intracranial abnormality. Generalized atrophy and small vessel white matter disease. 2. Scalp hematoma overlying the left frontal calvarium. No underlying fracture. 3. No acute fracture in the cervical spine. Multilevel degenerative spondylosis. 4. 1.8 cm pulmonary nodule is partially visualized in the left apex. This may be infectious/inflammatory however is incompletely evaluated on the current exam. Follow-up evaluation with nonemergent chest CT is recommended. 5. Pulmonary edema in the lung apices. Electronically Signed: By: Placido Sou M.D. On: 03/03/2022 01:07    Assessment/Plan There are no diagnoses linked to this encounter.   Family/ staff Communication:   Labs/tests ordered:

## 2022-03-12 NOTE — Assessment & Plan Note (Signed)
Dysphagia III diet.  

## 2022-03-12 NOTE — Assessment & Plan Note (Signed)
Healed subacute pelvic-right superior pubic ramus fx, T12, L1 Prn Tramadol/Tylenol,  Gabapentin for pain.

## 2022-03-12 NOTE — Assessment & Plan Note (Signed)
intermittent agitation

## 2022-03-12 NOTE — Assessment & Plan Note (Signed)
Na 133 03/05/22

## 2022-03-12 NOTE — Progress Notes (Signed)
Location:   S[facility] Room Number: 28 Place of Service:  SNF (31) Provider: [facility] Korin Setzler NP  Virgie Dad, MD  Patient Care Team: Virgie Dad, MD as PCP - General (Internal Medicine)  Extended Emergency Contact Information Primary Emergency Contact: [place] Mobile Phone: [phone] Relation: Grandson Interpreter needed? No Secondary Emergency Contact: [place] Mobile Phone: [phone] Relation: Granddaughter  Code Status: DNR Goals of care: Advanced Directive information    03/12/2022    3:15 PM  Advanced Directives  Does Patient Have a Medical Advance Directive? Yes  Type of Paramedic of [place];Out of facility DNR (pink MOST or yellow form);Living will  Does patient want to make changes to medical advance directive? No - Patient declined  Copy of [place] in Chart? Yes - validated most recent copy scanned in chart (See row information)  Pre-existing out of facility DNR order (yellow form or pink MOST form) Yellow form placed in chart (order not valid for inpatient use)     Chief Complaint  Patient presents with  . Acute Visit    Cough    HPI:  Pt is a 88 y.o. female seen today for an acute visit for persisted cough, no phlegm production, afebrile, no O2 desaturation.    Fall 03/04/22, a small bump noted right side of head.              Fall 03/02/22 ED eval, CT head no acute process, left frontal hematoma             Fall 02/15/22   C/o left groin pain, left rib pain under the left scapula, X-ray 03/11/22 L hip/femur, pelvis, ribs: no acute fx or dislocation.               Acute cough, better, after Furosemide 7 days started 03/09/22, pending BMP/CBC diff one week. CXR 03/09/22 no mass. Infiltrate, or consolidation, peribronchial cuffing likely represent acute vs chronic bronchial inflammation. Healed subacute pelvic-right superior pubic ramus fx, T12, L1 Prn Tramadol/Tylenol,  Gabapentin for  pain.             Dysphagia III diet             Dementia, intermittent agitation             Chronic Afib, heart rate is in control, takes ASA '81mg'$  qd, Hgb 13.7 03/05/22             HTN, takes Cozaar, Bun/creat 40/1.1 03/05/22             T2DM, on diet, Hgb a1c 6.8 10/11/21             OP, takes Prolia, Ca, Vit D             Depression/Anxiety, taking Quetiapine, Mirtazapine, Depakote, positive reponses,  anxiety, irritation, and restlessness .             Hyponatremia, Na 133 03/05/22 Past Medical History:  Diagnosis Date  . Anxiety   . Arthritis   . Cancer ([place])    hx of skin cancer removal on hand   . Colitis   . Diabetes mellitus without complication ([place])   . Gallstones   . Hyperlipidemia   . Hypertension   . IBS (irritable bowel syndrome)   . Osteoporosis    Past Surgical History:  Procedure Laterality Date  . APPENDECTOMY    . BACK SURGERY     x2  . cataract surgery  bilateral   . CHOLECYSTECTOMY  1999  . EYE SURGERY     for glaucoma   . left eye peeling surgery     . left femur bone surgery      1978  . REVERSE SHOULDER ARTHROPLASTY Right 10/10/2014   Procedure: RIGHT REVERSE SHOULDER ARTHROPLASTY;  Surgeon: Justice Britain, MD;  Location: [facility];  Service: Orthopedics;  Laterality: Right;  . ROTATOR CUFF REPAIR    . TOTAL HIP ARTHROPLASTY Left 02/20/2014   Procedure: LEFT TOTAL HIP ARTHROPLASTY ANTERIOR APPROACH;  Surgeon: Gearlean Alf, MD;  Location: [facility];  Service: Orthopedics;  Laterality: Left;  . WRIST SURGERY     left     Allergies  Allergen Reactions  . Robaxin [Methocarbamol] Rash and Other (See Comments)    Red rash on right side of face and forehead cleared after benadryl given, per [name], RN  . Ancef [Cefazolin] Diarrhea  . Dilaudid [Hydromorphone] Other (See Comments)    Stomach upset  . Fosamax [Alendronate] Other (See Comments)    Upset stomach  . Hyoscyamine Other (See Comments)    "Caused gas"  . Morphine And Related Itching  . Nsaids  Diarrhea  . Parathyroid Hormone (Recomb) Other (See Comments)    was not able to give herself the shot  . Penicillins Itching    Tolerated Unasyn Has patient had a PCN reaction causing immediate rash, facial/tongue/throat swelling, SOB or lightheadedness with hypotension: Yes Has patient had a PCN reaction causing severe rash involving mucus membranes or skin necrosis: No Has patient had a PCN reaction that required hospitalization: Was in hospital Has patient had a PCN reaction occurring within the last 10 years: No If all of the above answers are "NO", then may proceed with Cephalosporin use.   . Tape Other (See Comments)    Bandages and leads cause bruising and weeping at sites  . Flurbiprofen Diarrhea    Allergies as of 03/12/2022       Reactions   Robaxin [methocarbamol] Rash, Other (See Comments)   Red rash on right side of face and forehead cleared after benadryl given, per [name], RN   Ancef [cefazolin] Diarrhea   Dilaudid [hydromorphone] Other (See Comments)   Stomach upset   Fosamax [alendronate] Other (See Comments)   Upset stomach   Hyoscyamine Other (See Comments)   "Caused gas"   Morphine And Related Itching   Nsaids Diarrhea   Parathyroid Hormone (recomb) Other (See Comments)   was not able to give herself the shot   Penicillins Itching   Tolerated Unasyn Has patient had a PCN reaction causing immediate rash, facial/tongue/throat swelling, SOB or lightheadedness with hypotension: Yes Has patient had a PCN reaction causing severe rash involving mucus membranes or skin necrosis: No Has patient had a PCN reaction that required hospitalization: Was in hospital Has patient had a PCN reaction occurring within the last 10 years: No If all of the above answers are "NO", then may proceed with Cephalosporin use.   Tape Other (See Comments)   Bandages and leads cause bruising and weeping at sites   Flurbiprofen Diarrhea        Medication List        Accurate as of  March 12, 2022 11:59 PM. If you have any questions, ask your nurse or doctor.          acetaminophen 500 MG tablet Commonly known as: TYLENOL Take 2 tablets (1,000 mg total) by mouth 3 (three) times daily as needed.  aspirin EC 81 MG tablet Take 81 mg by mouth daily.   calcium carbonate 1500 (600 Ca) MG Tabs tablet Commonly known as: OSCAL Take 600 mg of elemental calcium by mouth daily.   Cholecalciferol 25 MCG (1000 UT) tablet Take 1,000 Units by mouth daily.   denosumab 60 MG/ML Sosy injection Commonly known as: PROLIA Inject 60 mg into the skin every 6 (six) months. Last injection 06/02/21   divalproex 125 MG capsule Commonly known as: DEPAKOTE SPRINKLE Take 125 mg by mouth 2 (two) times daily.   DSS 100 MG Caps Take 100 mg by mouth 2 (two) times daily.   furosemide 20 MG tablet Commonly known as: LASIX Take 20 mg by mouth daily.   gabapentin 100 MG capsule Commonly known as: NEURONTIN Take 200 mg by mouth 3 (three) times daily.   lactose free nutrition Liqd Take 237 mLs by mouth 2 (two) times daily between meals.   loratadine 10 MG tablet Commonly known as: CLARITIN Take 10 mg by mouth daily as needed for allergies.   LORazepam 0.5 MG tablet Commonly known as: ATIVAN Take 0.5 mg by mouth every 6 (six) hours as needed for anxiety.   losartan 50 MG tablet Commonly known as: COZAAR Take 1 tablet (50 mg total) by mouth daily.   MELATONIN PO Take 3 mg by mouth at bedtime as needed (sleep).   mirtazapine 15 MG tablet Commonly known as: REMERON Take 15 mg by mouth at bedtime.   potassium chloride 10 MEQ tablet Commonly known as: KLOR-CON M Take 10 mEq by mouth 2 (two) times daily.   PreserVision AREDS 2 Caps Take 1 capsule by mouth daily.   QUEtiapine 25 MG tablet Commonly known as: SEROQUEL Take 12.5 mg by mouth. 12.5 in the morning and 12.5 at bedtime   traMADol 50 MG tablet Commonly known as: ULTRAM Take 0.5 tablets (25 mg total) by mouth  every 8 (eight) hours as needed.        Review of Systems  Constitutional:  Negative for appetite change, fatigue and fever.  HENT:  Positive for hearing loss and trouble swallowing. Negative for congestion.   Eyes:  Positive for visual disturbance.       Blind right eye.   Respiratory:  Positive for cough. Negative for shortness of breath.   Cardiovascular:  Negative for leg swelling.  Gastrointestinal:  Negative for abdominal pain and constipation.  Genitourinary:  Positive for frequency. Negative for dysuria and urgency.  Musculoskeletal:  Positive for arthralgias, back pain and gait problem.       Left hip region pain, able to bear weight. Left chest wall pain palpated under the left scapula  Skin:  Positive for wound.  Neurological:  Negative for speech difficulty, weakness and headaches.       Memory difficulty.   Psychiatric/Behavioral:  Positive for behavioral problems, confusion and sleep disturbance. The patient is nervous/anxious.     Immunization History  Administered Date(s) Administered  . Influenza Split 07/04/2009, 04/09/2010, 05/06/2011, 03/29/2012, 05/03/2013  . Influenza, High Dose Seasonal PF 05/02/2013, 05/25/2017, 04/26/2018, 04/24/2019  . Influenza, Quadrivalent, Recombinant, Inj, Pf 04/26/2020  . Influenza,inj,Quad PF,6+ Mos 05/26/2021  . Influenza,inj,quad, With Preservative 04/18/2017  . Influenza-Unspecified 05/12/2015  . PFIZER(Purple Top)SARS-COV-2 Vaccination 08/25/2019, 09/19/2019  . Pension scheme manager 38yr & up 05/01/2020, 01/20/2021, 10/27/2021  . Pneumococcal Conjugate-13 01/29/2014  . Pneumococcal Polysaccharide-23 06/07/2007  . Td 05/02/2003  . Tdap 10/18/2011  . Zoster, Live 01/17/2006   Pertinent  Health Maintenance  Due  Topic Date Due  . FOOT EXAM  Never done  . OPHTHALMOLOGY EXAM  Never done  . DEXA SCAN  Never done  . INFLUENZA VACCINE  02/16/2022  . HEMOGLOBIN A1C  04/13/2022      10/15/2021    7:42 AM  10/16/2021   12:00 AM 10/16/2021    9:00 AM 12/10/2021    4:03 PM 03/02/2022   11:50 PM  Fall Risk  Patient Fall Risk Level High fall risk High fall risk High fall risk High fall risk High fall risk   Functional Status Survey:    Vitals:   03/12/22 1503  BP: (!) 122/58  Pulse: 60  Resp: 18  Temp: (!) 97.2 F (36.2 C)  SpO2: 94%  Weight: 139 lb 14.4 oz (63.5 kg)  Height: '5\' 2"'$  (1.575 m)   Body mass index is 25.59 kg/m. Physical Exam Nursing note reviewed.  Constitutional:      Appearance: Normal appearance.  HENT:     Head: Normocephalic and atraumatic.     Nose: Nose normal.     Mouth/Throat:     Mouth: Mucous membranes are moist.  Eyes:     Extraocular Movements: Extraocular movements intact.     Conjunctiva/sclera: Conjunctivae normal.     Pupils: Pupils are equal, round, and reactive to light.     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   Cardiovascular:     Rate and Rhythm: Normal rate and regular rhythm.     Heart sounds: No murmur heard. Pulmonary:     Effort: Pulmonary effort is normal.     Breath sounds: No wheezing, rhonchi or rales.     Comments: Coarse breathing sounds.  Abdominal:     General: Bowel sounds are normal.     Palpations: Abdomen is soft.     Tenderness: There is no abdominal tenderness.  Musculoskeletal:        General: Tenderness present.     Cervical back: Normal range of motion and neck supple.     Right lower leg: No edema.     Left lower leg: No edema.     Comments: Left hip region pain after fall, able to bear weight and walk. Left chest wall pain under the left scapula.   Skin:    General: Skin is warm and dry.     Findings: Bruising present.     Comments: Left frontal hematoma, a small lump right sided of head.   Neurological:     General: No focal deficit present.     Mental Status: She is alert. Mental status is at baseline.     Motor: No weakness.     Coordination: Coordination normal.     Gait: Gait abnormal.      Comments: Oriented to person  Psychiatric:        Mood and Affect: Mood normal.    Labs reviewed: Recent Labs    10/15/21 0640 10/16/21 0328 10/23/21 0000 12/10/21 1601 12/10/21 1823 12/29/21 0000 02/02/22 0000 03/05/22 0000  NA 133* 137   < > 137 134* 135* 140 133*  K 3.1* 4.0   < > 5.3* 5.1 4.9 3.6 5.0  CL 101 111   < > 100 101 99 101 97*  CO2 23 21*   < > 27 27 30* 30* 23*  GLUCOSE 118* 105*  --  99 112*  --   --   --   BUN 11 15   < > 38* 35*  30* 40* 40*  CREATININE 0.60 0.61   < > 0.95 0.91 1.0 1.2* 1.1  CALCIUM 8.7* 8.4*   < > 9.4 9.0 9.8 9.9 9.5  MG 1.7  --   --   --   --   --   --   --   PHOS 2.7  --   --   --   --   --   --   --    < > = values in this interval not displayed.   Recent Labs    10/11/21 0325 10/12/21 0322 10/23/21 0000 12/10/21 1601 12/29/21 0000 03/05/22 0000  AST 49* 39   < > '15 18 29  '$ ALT 25 24   < > '11 12 25  '$ ALKPHOS 41 34*   < > 42 56 61  BILITOT 0.6 0.4  --  0.4  --   --   PROT 5.8* 5.1*  --  6.8  --   --   ALBUMIN 3.1* 2.7*   < > 2.9* 3.7 4.0   < > = values in this interval not displayed.   Recent Labs    10/13/21 0704 10/16/21 0328 10/23/21 0000 12/10/21 1601 12/29/21 0000 03/05/22 0000  WBC 9.4 9.2   < > 11.1* 8.0 7.4  NEUTROABS  --   --    < > 7.3 3,584.00 4,544.00  HGB 11.6* 12.0   < > 10.4* 12.1 13.7  HCT 35.5* 36.7   < > 32.2* 37 41  MCV 96.7 94.8  --  98.2  --   --   PLT 282 365   < > 363 335 289   < > = values in this interval not displayed.   No results found for: "TSH" Lab Results  Component Value Date   HGBA1C 6.8 (H) 10/11/2021   No results found for: "CHOL", "HDL", "LDLCALC", "LDLDIRECT", "TRIG", "CHOLHDL"  Significant Diagnostic Results in last 30 days:  CT Head Wo Contrast  Addendum Date: 03/03/2022   ADDENDUM REPORT: 03/03/2022 02:06 ADDENDUM: These results were called by telephone at the time of interpretation on 03/03/2022 at 1:13 am to provider [place] , who verbally acknowledged these  results. Electronically Signed   By: Placido Sou M.D.   On: 03/03/2022 02:06   Result Date: 03/03/2022 CLINICAL DATA:  Fall from sitting position, hit left forehead EXAM: CT HEAD WITHOUT CONTRAST CT CERVICAL SPINE WITHOUT CONTRAST TECHNIQUE: Multidetector CT imaging of the head and cervical spine was performed following the standard protocol without intravenous contrast. Multiplanar CT image reconstructions of the cervical spine were also generated. RADIATION DOSE REDUCTION: This exam was performed according to the departmental dose-optimization program which includes automated exposure control, adjustment of the mA and/or kV according to patient size and/or use of iterative reconstruction technique. COMPARISON:  CT 12/10/2021 FINDINGS: CT HEAD FINDINGS Brain: No intracranial hemorrhage, mass effect, or evidence of acute infarct. No hydrocephalus. No extra-axial fluid collection. Generalized cerebral atrophy. Ill-defined hypoattenuation within the cerebral white matter is nonspecific but consistent with chronic small vessel ischemic disease. Vascular: No hyperdense vessel. Calcification of the intracranial internal carotid arteries. Skull: 2.9 x 1.0 cm scalp hematoma overlying the left frontal calvarium. No underlying calvarial fracture Sinuses/Orbits: No acute finding. Paranasal sinuses and mastoid air cells are well aerated. Other: None. CT CERVICAL SPINE FINDINGS Alignment: Multiple low-grade vertebral body subluxations are unchanged. Skull base and vertebrae: No acute fracture. No primary bone lesion or focal pathologic process. Inferior endplate deformities of T1 and T2 are  unchanged. Soft tissues and spinal canal: No prevertebral fluid or swelling. No visible canal hematoma. Disc levels: Multilevel advanced spondylosis, disc space height loss and degenerative endplate changes and facet arthropathy greatest from C5-C7. Posterior disc osteophyte complexes cause multilevel spinal canal narrowing greatest at  C7-T1 where it is mild-moderate in combination with ligamentum flavum calcifications. Uncovertebral spurring and facet arthropathy cause multilevel neural foraminal narrowing that is advanced at C3-C4 bilaterally, on the right at C4-C5, and bilaterally at C5-C6. These findings are not significantly changed from prior. Upper chest: 1.8 cm nodule in the left apex is partially visualized. Interlobular septal thickening and patchy ground-glass opacities in the apices. Other: None. IMPRESSION: 1. 1. No acute intracranial abnormality. Generalized atrophy and small vessel white matter disease. 2. Scalp hematoma overlying the left frontal calvarium. No underlying fracture. 3. No acute fracture in the cervical spine. Multilevel degenerative spondylosis. 4. 1.8 cm pulmonary nodule is partially visualized in the left apex. This may be infectious/inflammatory however is incompletely evaluated on the current exam. Follow-up evaluation with nonemergent chest CT is recommended. 5. Pulmonary edema in the lung apices. Electronically Signed: By: Placido Sou M.D. On: 03/03/2022 01:07   CT Cervical Spine Wo Contrast  Addendum Date: 03/03/2022   ADDENDUM REPORT: 03/03/2022 02:06 ADDENDUM: These results were called by telephone at the time of interpretation on 03/03/2022 at 1:13 am to provider COURTNEY HORTON , who verbally acknowledged these results. Electronically Signed   By: Placido Sou M.D.   On: 03/03/2022 02:06   Result Date: 03/03/2022 CLINICAL DATA:  Fall from sitting position, hit left forehead EXAM: CT HEAD WITHOUT CONTRAST CT CERVICAL SPINE WITHOUT CONTRAST TECHNIQUE: Multidetector CT imaging of the head and cervical spine was performed following the standard protocol without intravenous contrast. Multiplanar CT image reconstructions of the cervical spine were also generated. RADIATION DOSE REDUCTION: This exam was performed according to the departmental dose-optimization program which includes automated exposure  control, adjustment of the mA and/or kV according to patient size and/or use of iterative reconstruction technique. COMPARISON:  CT 12/10/2021 FINDINGS: CT HEAD FINDINGS Brain: No intracranial hemorrhage, mass effect, or evidence of acute infarct. No hydrocephalus. No extra-axial fluid collection. Generalized cerebral atrophy. Ill-defined hypoattenuation within the cerebral white matter is nonspecific but consistent with chronic small vessel ischemic disease. Vascular: No hyperdense vessel. Calcification of the intracranial internal carotid arteries. Skull: 2.9 x 1.0 cm scalp hematoma overlying the left frontal calvarium. No underlying calvarial fracture Sinuses/Orbits: No acute finding. Paranasal sinuses and mastoid air cells are well aerated. Other: None. CT CERVICAL SPINE FINDINGS Alignment: Multiple low-grade vertebral body subluxations are unchanged. Skull base and vertebrae: No acute fracture. No primary bone lesion or focal pathologic process. Inferior endplate deformities of T1 and T2 are unchanged. Soft tissues and spinal canal: No prevertebral fluid or swelling. No visible canal hematoma. Disc levels: Multilevel advanced spondylosis, disc space height loss and degenerative endplate changes and facet arthropathy greatest from C5-C7. Posterior disc osteophyte complexes cause multilevel spinal canal narrowing greatest at C7-T1 where it is mild-moderate in combination with ligamentum flavum calcifications. Uncovertebral spurring and facet arthropathy cause multilevel neural foraminal narrowing that is advanced at C3-C4 bilaterally, on the right at C4-C5, and bilaterally at C5-C6. These findings are not significantly changed from prior. Upper chest: 1.8 cm nodule in the left apex is partially visualized. Interlobular septal thickening and patchy ground-glass opacities in the apices. Other: None. IMPRESSION: 1. 1. No acute intracranial abnormality. Generalized atrophy and small vessel white matter disease. 2.  Scalp hematoma overlying the left frontal calvarium. No underlying fracture. 3. No acute fracture in the cervical spine. Multilevel degenerative spondylosis. 4. 1.8 cm pulmonary nodule is partially visualized in the left apex. This may be infectious/inflammatory however is incompletely evaluated on the current exam. Follow-up evaluation with nonemergent chest CT is recommended. 5. Pulmonary edema in the lung apices. Electronically Signed: By: Placido Sou M.D. On: 03/03/2022 01:07    Assessment/Plan: Cough Acute cough, better, after Furosemide 7 days started 03/09/22, pending BMP/CBC diff one week. CXR 03/09/22 no mass. Infiltrate, or consolidation, peribronchial cuffing likely represent acute vs chronic bronchial inflammation.  persisted cough, no phlegm production, afebrile, no O2 desaturation.   Will try Medrol dose pk  Left groin pain  Fall 03/04/22, a small bump noted right side of head.              Fall 03/02/22 ED eval, CT head no acute process, left frontal hematoma             Fall 02/15/22   C/o left groin pain, left rib pain under the left scapula, X-ray 03/11/22 L hip/femur, pelvis, ribs: no acute fx or dislocation.   Tylenol '500mg'$  tid po x 1 week  Osteoarthritis, multiple sites Healed subacute pelvic-right superior pubic ramus fx, T12, L1 Prn Tramadol/Tylenol,  Gabapentin for pain.  Dysphagia Dysphagia III diet  Dementia ([facility]) intermittent agitation  Chronic a-fib ([facility]) heart rate is in control, takes ASA '81mg'$  qd, Hgb 13.7 03/05/22  Essential hypertension Blood pressure is controlled,  takes Cozaar, Bun/creat 40/1.1 03/05/22  Diabetes ([place]) on diet, Hgb a1c 6.8 10/11/21  Osteoporosis takes Prolia, Ca, Vit D  Anxiety due to dementia [facility]) Depression/Anxiety, taking Quetiapine, Mirtazapine, Depakote, positive reponses,  anxiety, irritation, and restlessness .  Hyponatremia Na 133 03/05/22    Family/ staff Communication: plan of care reviewed with the patient and  charge nurse.   Labs/tests ordered:  pending CBC/diff, BMP  Time spend 35 minutes.

## 2022-03-12 NOTE — Assessment & Plan Note (Signed)
Fall 03/04/22, a small bump noted right side of head.              Fall 03/02/22 ED eval, CT head no acute process, left frontal hematoma             Fall 02/15/22   C/o left groin pain, left rib pain under the left scapula, X-ray 03/11/22 L hip/femur, pelvis, ribs: no acute fx or dislocation.   Tylenol '500mg'$  tid po x 1 week

## 2022-03-12 NOTE — Assessment & Plan Note (Signed)
on diet, Hgb a1c 6.8 10/11/21

## 2022-03-12 NOTE — Assessment & Plan Note (Signed)
Depression/Anxiety, taking Quetiapine, Mirtazapine, Depakote, positive reponses,  anxiety, irritation, and restlessness .

## 2022-03-12 NOTE — Assessment & Plan Note (Signed)
Blood pressure is controlled,  takes Cozaar, Bun/creat 40/1.1 03/05/22

## 2022-03-12 NOTE — Assessment & Plan Note (Signed)
Acute cough, better, after Furosemide 7 days started 03/09/22, pending BMP/CBC diff one week. CXR 03/09/22 no mass. Infiltrate, or consolidation, peribronchial cuffing likely represent acute vs chronic bronchial inflammation.  persisted cough, no phlegm production, afebrile, no O2 desaturation.   Will try Medrol dose pk

## 2022-03-15 ENCOUNTER — Encounter: Payer: Self-pay | Admitting: Nurse Practitioner

## 2022-03-15 DIAGNOSIS — F331 Major depressive disorder, recurrent, moderate: Secondary | ICD-10-CM | POA: Diagnosis not present

## 2022-03-15 DIAGNOSIS — R1312 Dysphagia, oropharyngeal phase: Secondary | ICD-10-CM | POA: Diagnosis not present

## 2022-03-15 DIAGNOSIS — R2681 Unsteadiness on feet: Secondary | ICD-10-CM | POA: Diagnosis not present

## 2022-03-15 DIAGNOSIS — R41841 Cognitive communication deficit: Secondary | ICD-10-CM | POA: Diagnosis not present

## 2022-03-15 DIAGNOSIS — R1310 Dysphagia, unspecified: Secondary | ICD-10-CM | POA: Diagnosis not present

## 2022-03-15 DIAGNOSIS — M6281 Muscle weakness (generalized): Secondary | ICD-10-CM | POA: Diagnosis not present

## 2022-03-15 DIAGNOSIS — F03C0 Unspecified dementia, severe, without behavioral disturbance, psychotic disturbance, mood disturbance, and anxiety: Secondary | ICD-10-CM | POA: Diagnosis not present

## 2022-03-15 DIAGNOSIS — F01B2 Vascular dementia, moderate, with psychotic disturbance: Secondary | ICD-10-CM | POA: Diagnosis not present

## 2022-03-15 DIAGNOSIS — F411 Generalized anxiety disorder: Secondary | ICD-10-CM | POA: Diagnosis not present

## 2022-03-16 DIAGNOSIS — R1312 Dysphagia, oropharyngeal phase: Secondary | ICD-10-CM | POA: Diagnosis not present

## 2022-03-16 DIAGNOSIS — R41841 Cognitive communication deficit: Secondary | ICD-10-CM | POA: Diagnosis not present

## 2022-03-16 DIAGNOSIS — E119 Type 2 diabetes mellitus without complications: Secondary | ICD-10-CM | POA: Diagnosis not present

## 2022-03-16 DIAGNOSIS — M6281 Muscle weakness (generalized): Secondary | ICD-10-CM | POA: Diagnosis not present

## 2022-03-16 DIAGNOSIS — F03C0 Unspecified dementia, severe, without behavioral disturbance, psychotic disturbance, mood disturbance, and anxiety: Secondary | ICD-10-CM | POA: Diagnosis not present

## 2022-03-16 DIAGNOSIS — I10 Essential (primary) hypertension: Secondary | ICD-10-CM | POA: Diagnosis not present

## 2022-03-16 DIAGNOSIS — R1310 Dysphagia, unspecified: Secondary | ICD-10-CM | POA: Diagnosis not present

## 2022-03-16 DIAGNOSIS — R2681 Unsteadiness on feet: Secondary | ICD-10-CM | POA: Diagnosis not present

## 2022-03-17 ENCOUNTER — Encounter: Payer: Self-pay | Admitting: Adult Health

## 2022-03-17 ENCOUNTER — Non-Acute Institutional Stay (SKILLED_NURSING_FACILITY): Payer: Medicare Other | Admitting: Adult Health

## 2022-03-17 DIAGNOSIS — R1312 Dysphagia, oropharyngeal phase: Secondary | ICD-10-CM | POA: Diagnosis not present

## 2022-03-17 DIAGNOSIS — F418 Other specified anxiety disorders: Secondary | ICD-10-CM

## 2022-03-17 DIAGNOSIS — R41841 Cognitive communication deficit: Secondary | ICD-10-CM | POA: Diagnosis not present

## 2022-03-17 DIAGNOSIS — R350 Frequency of micturition: Secondary | ICD-10-CM | POA: Diagnosis not present

## 2022-03-17 DIAGNOSIS — R051 Acute cough: Secondary | ICD-10-CM | POA: Diagnosis not present

## 2022-03-17 DIAGNOSIS — R1310 Dysphagia, unspecified: Secondary | ICD-10-CM | POA: Diagnosis not present

## 2022-03-17 DIAGNOSIS — M6281 Muscle weakness (generalized): Secondary | ICD-10-CM | POA: Diagnosis not present

## 2022-03-17 DIAGNOSIS — F03C0 Unspecified dementia, severe, without behavioral disturbance, psychotic disturbance, mood disturbance, and anxiety: Secondary | ICD-10-CM | POA: Diagnosis not present

## 2022-03-17 DIAGNOSIS — R2681 Unsteadiness on feet: Secondary | ICD-10-CM | POA: Diagnosis not present

## 2022-03-17 DIAGNOSIS — I10 Essential (primary) hypertension: Secondary | ICD-10-CM

## 2022-03-17 LAB — HEMOGLOBIN A1C: Hemoglobin A1C: 7.4

## 2022-03-17 LAB — BASIC METABOLIC PANEL
BUN: 28 — AB (ref 4–21)
CO2: 32 — AB (ref 13–22)
Chloride: 98 — AB (ref 99–108)
Creatinine: 0.9 (ref 0.5–1.1)
Glucose: 91
Potassium: 4.9 mEq/L (ref 3.5–5.1)
Sodium: 138 (ref 137–147)

## 2022-03-17 LAB — CBC AND DIFFERENTIAL
HCT: 42 (ref 36–46)
Hemoglobin: 13.7 (ref 12.0–16.0)
Platelets: 432 10*3/uL — AB (ref 150–400)
WBC: 11.5

## 2022-03-17 LAB — COMPREHENSIVE METABOLIC PANEL: Calcium: 10 (ref 8.7–10.7)

## 2022-03-17 LAB — CBC: RBC: 4.43 (ref 3.87–5.11)

## 2022-03-17 NOTE — Progress Notes (Signed)
Location:  [facility] Room Number: NO/35/A Place of Service:  SNF (31) Provider:  Durenda Age, DNP, FNP-BC  Patient Care Team: Virgie Dad, MD as PCP - General (Internal Medicine)  Extended Emergency Contact Information Primary Emergency Contact: [place] Mobile Phone: [phone] Relation: Grandson Interpreter needed? No Secondary Emergency Contact: [place] Mobile Phone: [phone] Relation: Granddaughter  Code Status:  DNR  Goals of care: Advanced Directive information    03/12/2022    3:15 PM  Advanced Directives  Does Patient Have a Medical Advance Directive? Yes  Type of Paramedic of [place];Out of facility DNR (pink MOST or yellow form);Living will  Does patient want to make changes to medical advance directive? No - Patient declined  Copy of [place] in Chart? Yes - validated most recent copy scanned in chart (See row information)  Pre-existing out of facility DNR order (yellow form or pink MOST form) Yellow form placed in chart (order not valid for inpatient use)     Chief Complaint  Patient presents with   Acute Visit    Patient is being seen for confusion and increased urinary frequency     HPI:  Pt is a 88 y.o. female seen today for an acute visit for confusion and increased in urinary frequency. She is a long-term care resident of [facility]. She has a PMH of osteoporosis, hypertension, arthritis, hyperlipidemia, diabetes mellitus, skin cancer, HOH, dementia and anxiety. Staff reported that resident has urinary frequency, confusion and urine has odor. She was seen in the room today. She was noted to have cough. No reported fever nor chills. Latest labs taken 03/16/22 showed:  wbc 11.5, hgb 13.7, gfr 58, K 4.9 and creatinine 0.92.  SBPs ranging from 122 to 138, with outliers 154 and 200. She takes Losartan 50 mg daily for hypertension.  PHQ-9  score 0, takes Seroquel 12.5 mg AM and HS, Remeron 15 mg Q HS and Lorazepam 0.5 mg Q 6 hours PRN for depression and anxiety.   Past Medical History:  Diagnosis Date   Anxiety    Arthritis    Cancer ([place])    hx of skin cancer removal on hand    Colitis    Diabetes mellitus without complication ([place])    Gallstones    Hyperlipidemia    Hypertension    IBS (irritable bowel syndrome)    Osteoporosis    Past Surgical History:  Procedure Laterality Date   APPENDECTOMY     BACK SURGERY     x2   cataract surgery      bilateral    [place]     for glaucoma    left eye peeling surgery      left femur bone surgery      1978   REVERSE SHOULDER ARTHROPLASTY Right 10/10/2014   Procedure: RIGHT REVERSE SHOULDER ARTHROPLASTY;  Surgeon: Justice Britain, MD;  Location: [facility];  Service: Orthopedics;  Laterality: Right;   ROTATOR CUFF REPAIR     TOTAL HIP ARTHROPLASTY Left 02/20/2014   Procedure: LEFT TOTAL HIP ARTHROPLASTY ANTERIOR APPROACH;  Surgeon: Gearlean Alf, MD;  Location: [facility];  Service: Orthopedics;  Laterality: Left;   WRIST SURGERY     left     Allergies  Allergen Reactions   Robaxin [Methocarbamol] Rash and Other (See Comments)    Red rash on right side of face and forehead cleared after benadryl given, per [name], RN  Ancef [Cefazolin] Diarrhea   Dilaudid [Hydromorphone] Other (See Comments)    Stomach upset   Fosamax [Alendronate] Other (See Comments)    Upset stomach   Hyoscyamine Other (See Comments)    "Caused gas"   Morphine And Related Itching   Nsaids Diarrhea   Parathyroid Hormone (Recomb) Other (See Comments)    was not able to give herself the shot   Penicillins Itching    Tolerated Unasyn Has patient had a PCN reaction causing immediate rash, facial/tongue/throat swelling, SOB or lightheadedness with hypotension: Yes Has patient had a PCN reaction causing severe rash involving mucus membranes or skin necrosis: No Has patient had  a PCN reaction that required hospitalization: Was in hospital Has patient had a PCN reaction occurring within the last 10 years: No If all of the above answers are "NO", then may proceed with Cephalosporin use.    Tape Other (See Comments)    Bandages and leads cause bruising and weeping at sites   Flurbiprofen Diarrhea    Outpatient Encounter Medications as of 03/17/2022  Medication Sig   acetaminophen (TYLENOL) 500 MG tablet Take 2 tablets (1,000 mg total) by mouth 3 (three) times daily as needed.   aspirin EC 81 MG tablet Take 81 mg by mouth daily.   calcium carbonate (OSCAL) 1500 (600 Ca) MG TABS tablet Take 600 mg of elemental calcium by mouth daily.   Cholecalciferol 25 MCG (1000 UT) tablet Take 1,000 Units by mouth daily.   denosumab (PROLIA) 60 MG/ML SOSY injection Inject 60 mg into the skin every 6 (six) months. Last injection 06/02/21   divalproex (DEPAKOTE SPRINKLE) 125 MG capsule Take 125 mg by mouth 2 (two) times daily.   docusate sodium 100 MG CAPS Take 100 mg by mouth 2 (two) times daily.   furosemide (LASIX) 20 MG tablet Take 20 mg by mouth daily.   gabapentin (NEURONTIN) 100 MG capsule Take 200 mg by mouth 3 (three) times daily.   lactose free nutrition (BOOST) LIQD Take 237 mLs by mouth 2 (two) times daily between meals.   loratadine (CLARITIN) 10 MG tablet Take 10 mg by mouth daily as needed for allergies.   LORazepam (ATIVAN) 0.5 MG tablet Take 0.5 mg by mouth every 6 (six) hours as needed for anxiety.   losartan (COZAAR) 50 MG tablet Take 1 tablet (50 mg total) by mouth daily.   MELATONIN PO Take 3 mg by mouth at bedtime as needed (sleep).   mirtazapine (REMERON) 15 MG tablet Take 15 mg by mouth at bedtime.   Multiple Vitamins-Minerals (PRESERVISION AREDS 2) CAPS Take 1 capsule by mouth daily.   potassium chloride (KLOR-CON M) 10 MEQ tablet Take 10 mEq by mouth 2 (two) times daily.   QUEtiapine (SEROQUEL) 25 MG tablet Take 12.5 mg by mouth. 12.5 in the morning and 12.5  at bedtime   traMADol (ULTRAM) 50 MG tablet Take 0.5 tablets (25 mg total) by mouth every 8 (eight) hours as needed.   No facility-administered encounter medications on file as of 03/17/2022.    Review of Systems  Constitutional:  Negative for activity change, chills and fever.  Respiratory:  Positive for cough.   Gastrointestinal:  Negative for abdominal distention, abdominal pain, diarrhea, nausea and vomiting.  Genitourinary:  Positive for frequency. Negative for difficulty urinating, dysuria, flank pain and urgency.      Immunization History  Administered Date(s) Administered   Influenza Split 07/04/2009, 04/09/2010, 05/06/2011, 03/29/2012, 05/03/2013   Influenza, High Dose Seasonal PF 05/02/2013, 05/25/2017, 04/26/2018,  04/24/2019   Influenza, Quadrivalent, Recombinant, Inj, Pf 04/26/2020   Influenza,inj,Quad PF,6+ Mos 05/26/2021   Influenza,inj,quad, With Preservative 04/18/2017   Influenza-Unspecified 05/12/2015   PFIZER(Purple Top)SARS-COV-2 Vaccination 08/25/2019, 09/19/2019   Pfizer Covid-19 Vaccine Bivalent Booster 76yr & up 05/01/2020, 01/20/2021, 10/27/2021   Pneumococcal Conjugate-13 01/29/2014   Pneumococcal Polysaccharide-23 06/07/2007   Td 05/02/2003   Tdap 10/18/2011   Zoster, Live 01/17/2006   Pertinent  Health Maintenance Due  Topic Date Due   FOOT EXAM  Never done   OPHTHALMOLOGY EXAM  Never done   DEXA SCAN  Never done   INFLUENZA VACCINE  02/16/2022   HEMOGLOBIN A1C  04/13/2022      10/15/2021    7:42 AM 10/16/2021   12:00 AM 10/16/2021    9:00 AM 12/10/2021    4:03 PM 03/02/2022   11:50 PM  Fall Risk  Patient Fall Risk Level High fall risk High fall risk High fall risk High fall risk High fall risk     Vitals:   03/17/22 1315  BP: (!) 122/58  Pulse: 73  Resp: 18  Temp: 98 F (36.7 C)  SpO2: 93%  Weight: 139 lb (63 kg)  Height: '5\' 2"'$  (1.575 m)   Body mass index is 25.42 kg/m.  Physical Exam Constitutional:      Appearance: Normal  appearance.  HENT:     Head: Normocephalic and atraumatic.     Nose: Nose normal.     Mouth/Throat:     Mouth: Mucous membranes are moist.  Eyes:     Conjunctiva/sclera: Conjunctivae normal.     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  Pulmonary:     Effort: Pulmonary effort is normal.     Breath sounds: Normal breath sounds.  Abdominal:     General: Bowel sounds are normal.     Palpations: Abdomen is soft.  Musculoskeletal:        General: Normal range of motion.     Cervical back: Normal range of motion.  Skin:    General: Skin is warm and dry.     Comments: Left eye has bruise  Neurological:     General: No focal deficit present.     Mental Status: She is alert.  Psychiatric:        Mood and Affect: Mood normal.        Behavior: Behavior normal.        Labs reviewed: Recent Labs    10/15/21 0640 10/16/21 0328 10/23/21 0000 12/10/21 1601 12/10/21 1823 12/29/21 0000 02/02/22 0000 03/05/22 0000  NA 133* 137   < > 137 134* 135* 140 133*  K 3.1* 4.0   < > 5.3* 5.1 4.9 3.6 5.0  CL 101 111   < > 100 101 99 101 97*  CO2 23 21*   < > 27 27 30* 30* 23*  GLUCOSE 118* 105*  --  99 112*  --   --   --   BUN 11 15   < > 38* 35* 30* 40* 40*  CREATININE 0.60 0.61   < > 0.95 0.91 1.0 1.2* 1.1  CALCIUM 8.7* 8.4*   < > 9.4 9.0 9.8 9.9 9.5  MG 1.7  --   --   --   --   --   --   --   PHOS 2.7  --   --   --   --   --   --   --    < > =  values in this interval not displayed.   Recent Labs    10/11/21 0325 10/12/21 0322 10/23/21 0000 12/10/21 1601 12/29/21 0000 03/05/22 0000  AST 49* 39   < > '15 18 29  '$ ALT 25 24   < > '11 12 25  '$ ALKPHOS 41 34*   < > 42 56 61  BILITOT 0.6 0.4  --  0.4  --   --   PROT 5.8* 5.1*  --  6.8  --   --   ALBUMIN 3.1* 2.7*   < > 2.9* 3.7 4.0   < > = values in this interval not displayed.   Recent Labs    10/13/21 0704 10/16/21 0328 10/23/21 0000 12/10/21 1601 12/29/21 0000  03/05/22 0000  WBC 9.4 9.2   < > 11.1* 8.0 7.4  NEUTROABS  --   --    < > 7.3 3,584.00 4,544.00  HGB 11.6* 12.0   < > 10.4* 12.1 13.7  HCT 35.5* 36.7   < > 32.2* 37 41  MCV 96.7 94.8  --  98.2  --   --   PLT 282 365   < > 363 335 289   < > = values in this interval not displayed.   No results found for: "TSH" Lab Results  Component Value Date   HGBA1C 6.8 (H) 10/11/2021   No results found for: "CHOL", "HDL", "LDLCALC", "LDLDIRECT", "TRIG", "CHOLHDL"  Significant Diagnostic Results in last 30 days:  CT Head Wo Contrast  Addendum Date: 03/03/2022   ADDENDUM REPORT: 03/03/2022 02:06 ADDENDUM: These results were called by telephone at the time of interpretation on 03/03/2022 at 1:13 am to provider [place] , who verbally acknowledged these results. Electronically Signed   By: Placido Sou M.D.   On: 03/03/2022 02:06   Result Date: 03/03/2022 CLINICAL DATA:  Fall from sitting position, hit left forehead EXAM: CT HEAD WITHOUT CONTRAST CT CERVICAL SPINE WITHOUT CONTRAST TECHNIQUE: Multidetector CT imaging of the head and cervical spine was performed following the standard protocol without intravenous contrast. Multiplanar CT image reconstructions of the cervical spine were also generated. RADIATION DOSE REDUCTION: This exam was performed according to the departmental dose-optimization program which includes automated exposure control, adjustment of the mA and/or kV according to patient size and/or use of iterative reconstruction technique. COMPARISON:  CT 12/10/2021 FINDINGS: CT HEAD FINDINGS Brain: No intracranial hemorrhage, mass effect, or evidence of acute infarct. No hydrocephalus. No extra-axial fluid collection. Generalized cerebral atrophy. Ill-defined hypoattenuation within the cerebral white matter is nonspecific but consistent with chronic small vessel ischemic disease. Vascular: No hyperdense vessel. Calcification of the intracranial internal carotid arteries. Skull: 2.9 x 1.0 cm  scalp hematoma overlying the left frontal calvarium. No underlying calvarial fracture Sinuses/Orbits: No acute finding. Paranasal sinuses and mastoid air cells are well aerated. Other: None. CT CERVICAL SPINE FINDINGS Alignment: Multiple low-grade vertebral body subluxations are unchanged. Skull base and vertebrae: No acute fracture. No primary bone lesion or focal pathologic process. Inferior endplate deformities of T1 and T2 are unchanged. Soft tissues and spinal canal: No prevertebral fluid or swelling. No visible canal hematoma. Disc levels: Multilevel advanced spondylosis, disc space height loss and degenerative endplate changes and facet arthropathy greatest from C5-C7. Posterior disc osteophyte complexes cause multilevel spinal canal narrowing greatest at C7-T1 where it is mild-moderate in combination with ligamentum flavum calcifications. Uncovertebral spurring and facet arthropathy cause multilevel neural foraminal narrowing that is advanced at C3-C4 bilaterally, on the right at C4-C5, and bilaterally at C5-C6.  These findings are not significantly changed from prior. Upper chest: 1.8 cm nodule in the left apex is partially visualized. Interlobular septal thickening and patchy ground-glass opacities in the apices. Other: None. IMPRESSION: 1. 1. No acute intracranial abnormality. Generalized atrophy and small vessel white matter disease. 2. Scalp hematoma overlying the left frontal calvarium. No underlying fracture. 3. No acute fracture in the cervical spine. Multilevel degenerative spondylosis. 4. 1.8 cm pulmonary nodule is partially visualized in the left apex. This may be infectious/inflammatory however is incompletely evaluated on the current exam. Follow-up evaluation with nonemergent chest CT is recommended. 5. Pulmonary edema in the lung apices. Electronically Signed: By: Placido Sou M.D. On: 03/03/2022 01:07   CT Cervical Spine Wo Contrast  Addendum Date: 03/03/2022   ADDENDUM REPORT:  03/03/2022 02:06 ADDENDUM: These results were called by telephone at the time of interpretation on 03/03/2022 at 1:13 am to provider COURTNEY HORTON , who verbally acknowledged these results. Electronically Signed   By: Placido Sou M.D.   On: 03/03/2022 02:06   Result Date: 03/03/2022 CLINICAL DATA:  Fall from sitting position, hit left forehead EXAM: CT HEAD WITHOUT CONTRAST CT CERVICAL SPINE WITHOUT CONTRAST TECHNIQUE: Multidetector CT imaging of the head and cervical spine was performed following the standard protocol without intravenous contrast. Multiplanar CT image reconstructions of the cervical spine were also generated. RADIATION DOSE REDUCTION: This exam was performed according to the departmental dose-optimization program which includes automated exposure control, adjustment of the mA and/or kV according to patient size and/or use of iterative reconstruction technique. COMPARISON:  CT 12/10/2021 FINDINGS: CT HEAD FINDINGS Brain: No intracranial hemorrhage, mass effect, or evidence of acute infarct. No hydrocephalus. No extra-axial fluid collection. Generalized cerebral atrophy. Ill-defined hypoattenuation within the cerebral white matter is nonspecific but consistent with chronic small vessel ischemic disease. Vascular: No hyperdense vessel. Calcification of the intracranial internal carotid arteries. Skull: 2.9 x 1.0 cm scalp hematoma overlying the left frontal calvarium. No underlying calvarial fracture Sinuses/Orbits: No acute finding. Paranasal sinuses and mastoid air cells are well aerated. Other: None. CT CERVICAL SPINE FINDINGS Alignment: Multiple low-grade vertebral body subluxations are unchanged. Skull base and vertebrae: No acute fracture. No primary bone lesion or focal pathologic process. Inferior endplate deformities of T1 and T2 are unchanged. Soft tissues and spinal canal: No prevertebral fluid or swelling. No visible canal hematoma. Disc levels: Multilevel advanced spondylosis, disc  space height loss and degenerative endplate changes and facet arthropathy greatest from C5-C7. Posterior disc osteophyte complexes cause multilevel spinal canal narrowing greatest at C7-T1 where it is mild-moderate in combination with ligamentum flavum calcifications. Uncovertebral spurring and facet arthropathy cause multilevel neural foraminal narrowing that is advanced at C3-C4 bilaterally, on the right at C4-C5, and bilaterally at C5-C6. These findings are not significantly changed from prior. Upper chest: 1.8 cm nodule in the left apex is partially visualized. Interlobular septal thickening and patchy ground-glass opacities in the apices. Other: None. IMPRESSION: 1. 1. No acute intracranial abnormality. Generalized atrophy and small vessel white matter disease. 2. Scalp hematoma overlying the left frontal calvarium. No underlying fracture. 3. No acute fracture in the cervical spine. Multilevel degenerative spondylosis. 4. 1.8 cm pulmonary nodule is partially visualized in the left apex. This may be infectious/inflammatory however is incompletely evaluated on the current exam. Follow-up evaluation with nonemergent chest CT is recommended. 5. Pulmonary edema in the lung apices. Electronically Signed: By: Placido Sou M.D. On: 03/03/2022 01:07    Assessment/Plan  1. Urinary frequency -  will get  UA with CS to rule out UTI  2. Acute cough -   start on Guaifenesin 100 mg/5 ml give 10 ml = 200 mg Q 6am, 2pm and 10pm X 1 week  3. Essential hypertension -Blood pressure stable Continue current medication  4. Depression with anxiety -  no depressed mood, continue Remeron, Seroquel and Lorazepam PRN    Family/ staff Communication: Discussed plan of care with resident and charge nurse.  Labs/tests ordered: UA with CS    Durenda Age, DNP, MSN, FNP-BC [place] [phone] (Monday-Friday 8:00 a.m. - 5:00 p.m.) [phone] (after hours)

## 2022-03-18 DIAGNOSIS — R41841 Cognitive communication deficit: Secondary | ICD-10-CM | POA: Diagnosis not present

## 2022-03-18 DIAGNOSIS — N390 Urinary tract infection, site not specified: Secondary | ICD-10-CM | POA: Diagnosis not present

## 2022-03-18 DIAGNOSIS — M6281 Muscle weakness (generalized): Secondary | ICD-10-CM | POA: Diagnosis not present

## 2022-03-18 DIAGNOSIS — R2681 Unsteadiness on feet: Secondary | ICD-10-CM | POA: Diagnosis not present

## 2022-03-18 DIAGNOSIS — R1312 Dysphagia, oropharyngeal phase: Secondary | ICD-10-CM | POA: Diagnosis not present

## 2022-03-18 DIAGNOSIS — F03C0 Unspecified dementia, severe, without behavioral disturbance, psychotic disturbance, mood disturbance, and anxiety: Secondary | ICD-10-CM | POA: Diagnosis not present

## 2022-03-18 DIAGNOSIS — R1310 Dysphagia, unspecified: Secondary | ICD-10-CM | POA: Diagnosis not present

## 2022-03-22 DIAGNOSIS — R41841 Cognitive communication deficit: Secondary | ICD-10-CM | POA: Diagnosis not present

## 2022-03-22 DIAGNOSIS — R1312 Dysphagia, oropharyngeal phase: Secondary | ICD-10-CM | POA: Diagnosis not present

## 2022-03-22 DIAGNOSIS — M6281 Muscle weakness (generalized): Secondary | ICD-10-CM | POA: Diagnosis not present

## 2022-03-22 DIAGNOSIS — F03C0 Unspecified dementia, severe, without behavioral disturbance, psychotic disturbance, mood disturbance, and anxiety: Secondary | ICD-10-CM | POA: Diagnosis not present

## 2022-03-22 DIAGNOSIS — R1310 Dysphagia, unspecified: Secondary | ICD-10-CM | POA: Diagnosis not present

## 2022-03-22 DIAGNOSIS — R296 Repeated falls: Secondary | ICD-10-CM | POA: Diagnosis not present

## 2022-03-22 DIAGNOSIS — R2681 Unsteadiness on feet: Secondary | ICD-10-CM | POA: Diagnosis not present

## 2022-03-22 DIAGNOSIS — J209 Acute bronchitis, unspecified: Secondary | ICD-10-CM | POA: Diagnosis not present

## 2022-03-23 ENCOUNTER — Non-Acute Institutional Stay (SKILLED_NURSING_FACILITY): Payer: Medicare Other | Admitting: Nurse Practitioner

## 2022-03-23 ENCOUNTER — Encounter: Payer: Self-pay | Admitting: Nurse Practitioner

## 2022-03-23 DIAGNOSIS — F03C0 Unspecified dementia, severe, without behavioral disturbance, psychotic disturbance, mood disturbance, and anxiety: Secondary | ICD-10-CM | POA: Diagnosis not present

## 2022-03-23 DIAGNOSIS — N390 Urinary tract infection, site not specified: Secondary | ICD-10-CM | POA: Diagnosis not present

## 2022-03-23 DIAGNOSIS — R2681 Unsteadiness on feet: Secondary | ICD-10-CM | POA: Diagnosis not present

## 2022-03-23 DIAGNOSIS — M810 Age-related osteoporosis without current pathological fracture: Secondary | ICD-10-CM | POA: Diagnosis not present

## 2022-03-23 DIAGNOSIS — F0394 Unspecified dementia, unspecified severity, with anxiety: Secondary | ICD-10-CM

## 2022-03-23 DIAGNOSIS — R1310 Dysphagia, unspecified: Secondary | ICD-10-CM | POA: Diagnosis not present

## 2022-03-23 DIAGNOSIS — M6281 Muscle weakness (generalized): Secondary | ICD-10-CM | POA: Diagnosis not present

## 2022-03-23 DIAGNOSIS — E0800 Diabetes mellitus due to underlying condition with hyperosmolarity without nonketotic hyperglycemic-hyperosmolar coma (NKHHC): Secondary | ICD-10-CM | POA: Diagnosis not present

## 2022-03-23 DIAGNOSIS — F03C11 Unspecified dementia, severe, with agitation: Secondary | ICD-10-CM | POA: Diagnosis not present

## 2022-03-23 DIAGNOSIS — I482 Chronic atrial fibrillation, unspecified: Secondary | ICD-10-CM | POA: Diagnosis not present

## 2022-03-23 DIAGNOSIS — R41841 Cognitive communication deficit: Secondary | ICD-10-CM | POA: Diagnosis not present

## 2022-03-23 DIAGNOSIS — R1312 Dysphagia, oropharyngeal phase: Secondary | ICD-10-CM | POA: Diagnosis not present

## 2022-03-23 DIAGNOSIS — I10 Essential (primary) hypertension: Secondary | ICD-10-CM

## 2022-03-23 DIAGNOSIS — M159 Polyosteoarthritis, unspecified: Secondary | ICD-10-CM | POA: Diagnosis not present

## 2022-03-23 DIAGNOSIS — R296 Repeated falls: Secondary | ICD-10-CM | POA: Diagnosis not present

## 2022-03-23 DIAGNOSIS — R051 Acute cough: Secondary | ICD-10-CM

## 2022-03-23 DIAGNOSIS — M150 Primary generalized (osteo)arthritis: Secondary | ICD-10-CM

## 2022-03-23 NOTE — Assessment & Plan Note (Addendum)
C/o left groin pain, left rib pain under the left scapula, X-ray 03/11/22 L hip/femur, pelvis, ribs: no acute fx or dislocation.  Healed subacute pelvic-right superior pubic ramus fx, T12, L1 Prn Tramadol/Tylenol,  Gabapentin for pain.

## 2022-03-23 NOTE — Progress Notes (Signed)
Location:   [facility] Room Number: 35-A Place of Service:  SNF (31) Provider:  Nadine Ryle, NP\  PCP: Virgie Dad, MD  Patient Care Team: Virgie Dad, MD as PCP - General (Internal Medicine)  Extended Emergency Contact Information Primary Emergency Contact: [place] Mobile Phone: [phone] Relation: Grandson Interpreter needed? No Secondary Emergency Contact: [place] Mobile Phone: [phone] Relation: Granddaughter  Code Status:  DNR Goals of care: Advanced Directive information    03/23/2022   10:39 AM  Advanced Directives  Does Patient Have a Medical Advance Directive? Yes  Type of Paramedic of [place];Living will;Out of facility DNR (pink MOST or yellow form)  Does patient want to make changes to medical advance directive? No - Patient declined  Copy of [place] in Chart? Yes - validated most recent copy scanned in chart (See row information)     Chief Complaint  Patient presents with   Medical Management of Chronic Issues    Routine Visit.   Health Maintenance    Discuss the need for Foot exam, Dexa scan, and Eye exam.    Immunizations    Discuss the need for Covid Booster, Tetanus vaccine, Shingrix vaccine, and Influenza vaccine.     HPI:  Pt is a 88 y.o. female seen today for medical management of chronic diseases.  UTI Cipro '500mg'$  bid x 5 days started 03/20/22, UA showed +nitrite, packed wbc, 3+ leukocyte esterase, many bacteria    Fall 03/04/22, a small bump noted right side of head.              Fall 03/02/22 ED eval, CT head no acute process, left frontal hematoma             Fall 02/15/22              C/o left groin pain, left rib pain under the left scapula, X-ray 03/11/22 L hip/femur, pelvis, ribs: no acute fx or dislocation.   Healed subacute pelvic-right superior pubic ramus fx, T12, L1 Prn Tramadol/Tylenol,  Gabapentin for pain.            Acute cough, better,  after Furosemide 7 days started 03/09/22 and Medrol dose pk,  unremarkable  BMP/CBC dif, CXR 03/09/22 no mass. Infiltrate, or consolidation, peribronchial cuffing likely represent acute vs chronic bronchial inflammation             Dysphagia III diet             Dementia, intermittent agitation             Chronic Afib, heart rate is in control, takes ASA '81mg'$  qd, Hgb 13.7 03/17/22             HTN, takes Cozaar, Bun/creat 28/0.9 03/17/22             T2DM, on diet, Hgb a1c 6.8 10/11/21             OP, takes Prolia, Ca, Vit D             Depression/Anxiety, taking Quetiapine, Mirtazapine, Depakote, positive reponses,  anxiety, irritation, and restlessness .             Hyponatremia, Na 138 03/17/22  Past Medical History:  Diagnosis Date   Anxiety    Arthritis    Cancer ([place])    hx of skin cancer removal on hand    Colitis    Diabetes mellitus without complication ([place])  Gallstones    Hyperlipidemia    Hypertension    IBS (irritable bowel syndrome)    Osteoporosis    Past Surgical History:  Procedure Laterality Date   APPENDECTOMY     BACK SURGERY     x2   cataract surgery      bilateral    CHOLECYSTECTOMY  1999   EYE SURGERY     for glaucoma    left eye peeling surgery      left femur bone surgery      1978   REVERSE SHOULDER ARTHROPLASTY Right 10/10/2014   Procedure: RIGHT REVERSE SHOULDER ARTHROPLASTY;  Surgeon: Justice Britain, MD;  Location: [facility];  Service: Orthopedics;  Laterality: Right;   ROTATOR CUFF REPAIR     TOTAL HIP ARTHROPLASTY Left 02/20/2014   Procedure: LEFT TOTAL HIP ARTHROPLASTY ANTERIOR APPROACH;  Surgeon: Gearlean Alf, MD;  Location: [facility];  Service: Orthopedics;  Laterality: Left;   WRIST SURGERY     left     Allergies  Allergen Reactions   Robaxin [Methocarbamol] Rash and Other (See Comments)    Red rash on right side of face and forehead cleared after benadryl given, per [name], RN   Cefazolin Diarrhea   Dilaudid [Hydromorphone] Other (See Comments)     Stomach upset   Fosamax [Alendronate] Other (See Comments)    Upset stomach   Hyoscyamine Other (See Comments)    "Caused gas"   Morphine And Related Itching   Nsaids Diarrhea   Parathyroid Hormone (Recomb) Other (See Comments)    was not able to give herself the shot   Penicillins Itching    Tolerated Unasyn Has patient had a PCN reaction causing immediate rash, facial/tongue/throat swelling, SOB or lightheadedness with hypotension: Yes Has patient had a PCN reaction causing severe rash involving mucus membranes or skin necrosis: No Has patient had a PCN reaction that required hospitalization: Was in hospital Has patient had a PCN reaction occurring within the last 10 years: No If all of the above answers are "NO", then may proceed with Cephalosporin use.    Tape Other (See Comments)    Bandages and leads cause bruising and weeping at sites   Flurbiprofen Diarrhea    Allergies as of 03/23/2022       Reactions   Robaxin [methocarbamol] Rash, Other (See Comments)   Red rash on right side of face and forehead cleared after benadryl given, per [name], RN   Cefazolin Diarrhea   Dilaudid [hydromorphone] Other (See Comments)   Stomach upset   Fosamax [alendronate] Other (See Comments)   Upset stomach   Hyoscyamine Other (See Comments)   "Caused gas"   Morphine And Related Itching   Nsaids Diarrhea   Parathyroid Hormone (recomb) Other (See Comments)   was not able to give herself the shot   Penicillins Itching   Tolerated Unasyn Has patient had a PCN reaction causing immediate rash, facial/tongue/throat swelling, SOB or lightheadedness with hypotension: Yes Has patient had a PCN reaction causing severe rash involving mucus membranes or skin necrosis: No Has patient had a PCN reaction that required hospitalization: Was in hospital Has patient had a PCN reaction occurring within the last 10 years: No If all of the above answers are "NO", then may proceed with Cephalosporin use.    Tape Other (See Comments)   Bandages and leads cause bruising and weeping at sites   Flurbiprofen Diarrhea        Medication List  Accurate as of March 23, 2022  3:52 PM. If you have any questions, ask your nurse or doctor.          STOP taking these medications    furosemide 20 MG tablet Commonly known as: LASIX Stopped by: Issac Moure X Maebelle Sulton, NP   potassium chloride 10 MEQ tablet Commonly known as: KLOR-CON M Stopped by: Sindy Mccune X Adrick Kestler, NP       TAKE these medications    acetaminophen 500 MG tablet Commonly known as: TYLENOL Take 2 tablets (1,000 mg total) by mouth 3 (three) times daily as needed.   aspirin EC 81 MG tablet Take 81 mg by mouth daily.   calcium carbonate 1500 (600 Ca) MG Tabs tablet Commonly known as: OSCAL Take 600 mg of elemental calcium by mouth daily.   Cholecalciferol 25 MCG (1000 UT) tablet Take 1,000 Units by mouth daily.   ciprofloxacin 500 MG tablet Commonly known as: CIPRO Take 500 mg by mouth 2 (two) times daily.   denosumab 60 MG/ML Sosy injection Commonly known as: PROLIA Inject 60 mg into the skin every 6 (six) months. Last injection 06/02/21   divalproex 125 MG capsule Commonly known as: DEPAKOTE SPRINKLE Take 125 mg by mouth 2 (two) times daily.   DSS 100 MG Caps Take 100 mg by mouth 2 (two) times daily.   gabapentin 100 MG capsule Commonly known as: NEURONTIN Take 200 mg by mouth 2 (two) times daily.   guaiFENesin 100 MG/5ML liquid Commonly known as: ROBITUSSIN Take 5 mLs by mouth 3 (three) times daily.   lactose free nutrition Liqd Take 237 mLs by mouth 2 (two) times daily between meals.   loratadine 10 MG tablet Commonly known as: CLARITIN Take 10 mg by mouth daily as needed for allergies.   LORazepam 0.5 MG tablet Commonly known as: ATIVAN Take 0.5 mg by mouth every 6 (six) hours as needed for anxiety.   losartan 50 MG tablet Commonly known as: COZAAR Take 1 tablet (50 mg total) by mouth daily.    MELATONIN PO Take 3 mg by mouth at bedtime as needed (sleep).   mirtazapine 15 MG tablet Commonly known as: REMERON Take 15 mg by mouth at bedtime.   PreserVision AREDS 2 Caps Take 1 capsule by mouth daily.   QUEtiapine 25 MG tablet Commonly known as: SEROQUEL Take 12.5 mg by mouth 2 (two) times daily.   saccharomyces boulardii 250 MG capsule Commonly known as: FLORASTOR Take 250 mg by mouth 2 (two) times daily.   traMADol 50 MG tablet Commonly known as: ULTRAM Take 0.5 tablets (25 mg total) by mouth every 8 (eight) hours as needed.        Review of Systems  Constitutional:  Negative for appetite change, fatigue and fever.  HENT:  Positive for hearing loss and trouble swallowing. Negative for congestion.   Eyes:  Positive for visual disturbance.       Blind right eye.   Respiratory:  Positive for cough. Negative for shortness of breath.   Cardiovascular:  Negative for leg swelling.  Gastrointestinal:  Negative for abdominal pain and constipation.  Genitourinary:  Positive for frequency. Negative for dysuria, hematuria and urgency.  Musculoskeletal:  Positive for arthralgias, back pain and gait problem.       Left hip region pain, able to bear weight. Left chest wall pain palpated under the left scapula  Skin:  Positive for wound.  Neurological:  Negative for speech difficulty, weakness and headaches.  Memory difficulty.   Psychiatric/Behavioral:  Positive for behavioral problems, confusion and sleep disturbance. The patient is nervous/anxious.     Immunization History  Administered Date(s) Administered   Influenza Split 07/04/2009, 04/09/2010, 05/06/2011, 03/29/2012, 05/03/2013   Influenza, High Dose Seasonal PF 05/02/2013, 05/25/2017, 04/26/2018, 04/24/2019   Influenza, Quadrivalent, Recombinant, Inj, Pf 04/26/2020   Influenza,inj,Quad PF,6+ Mos 05/26/2021   Influenza,inj,quad, With Preservative 04/18/2017   Influenza-Unspecified 05/12/2015   PFIZER(Purple  Top)SARS-COV-2 Vaccination 08/25/2019, 09/19/2019   Pfizer Covid-19 Vaccine Bivalent Booster 35yr & up 05/01/2020, 01/20/2021, 10/27/2021   Pneumococcal Conjugate-13 01/29/2014   Pneumococcal Polysaccharide-23 06/07/2007   Td 05/02/2003   Tdap 10/18/2011   Zoster, Live 01/17/2006   Pertinent  Health Maintenance Due  Topic Date Due   FOOT EXAM  Never done   OPHTHALMOLOGY EXAM  Never done   DEXA SCAN  Never done   INFLUENZA VACCINE  02/16/2022   HEMOGLOBIN A1C  09/16/2022      10/15/2021    7:42 AM 10/16/2021   12:00 AM 10/16/2021    9:00 AM 12/10/2021    4:03 PM 03/02/2022   11:50 PM  Fall Risk  Patient Fall Risk Level High fall risk High fall risk High fall risk High fall risk High fall risk   Functional Status Survey:    Vitals:   03/23/22 1021  BP: 131/61  Pulse: 87  Resp: (!) 24  Temp: (!) 97.3 F (36.3 C)  SpO2: 92%  Weight: 134 lb 6.4 oz (61 kg)  Height: '5\' 2"'$  (1.575 m)   Body mass index is 24.58 kg/m. Physical Exam Nursing note reviewed.  Constitutional:      Appearance: Normal appearance.  HENT:     Head: Normocephalic and atraumatic.     Nose: Nose normal.     Mouth/Throat:     Mouth: Mucous membranes are moist.  Eyes:     Extraocular Movements: Extraocular movements intact.     Conjunctiva/sclera: Conjunctivae normal.     Pupils: Pupils are equal, round, and reactive to light.     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   Cardiovascular:     Rate and Rhythm: Normal rate and regular rhythm.     Heart sounds: No murmur heard. Pulmonary:     Effort: Pulmonary effort is normal.     Breath sounds: No wheezing, rhonchi or rales.  Abdominal:     General: Bowel sounds are normal.     Palpations: Abdomen is soft.     Tenderness: There is no abdominal tenderness. There is no right CVA tenderness, guarding or rebound.  Musculoskeletal:        General: Tenderness present.     Cervical back: Normal range of motion and neck supple.     Right  lower leg: No edema.     Left lower leg: No edema.     Comments: Left hip region pain after fall, able to bear weight and walk. Left chest wall pain under the left scapula.   Skin:    General: Skin is warm and dry.     Findings: Bruising present.     Comments: Left frontal hematoma, a small lump right sided of head.   Neurological:     General: No focal deficit present.     Mental Status: She is alert. Mental status is at baseline.     Motor: No weakness.     Coordination: Coordination normal.     Gait: Gait abnormal.     Comments: Oriented  to person  Psychiatric:        Mood and Affect: Mood normal.     Labs reviewed: Recent Labs    10/15/21 0640 10/16/21 0328 10/23/21 0000 12/10/21 1601 12/10/21 1823 12/29/21 0000 02/02/22 0000 03/05/22 0000 03/17/22 0000  NA 133* 137   < > 137 134*   < > 140 133* 138  K 3.1* 4.0   < > 5.3* 5.1   < > 3.6 5.0 4.9  CL 101 111   < > 100 101   < > 101 97* 98*  CO2 23 21*   < > 27 27   < > 30* 23* 32*  GLUCOSE 118* 105*  --  99 112*  --   --   --   --   BUN 11 15   < > 38* 35*   < > 40* 40* 28*  CREATININE 0.60 0.61   < > 0.95 0.91   < > 1.2* 1.1 0.9  CALCIUM 8.7* 8.4*   < > 9.4 9.0   < > 9.9 9.5 10.0  MG 1.7  --   --   --   --   --   --   --   --   PHOS 2.7  --   --   --   --   --   --   --   --    < > = values in this interval not displayed.   Recent Labs    10/11/21 0325 10/12/21 0322 10/23/21 0000 12/10/21 1601 12/29/21 0000 03/05/22 0000  AST 49* 39   < > '15 18 29  '$ ALT 25 24   < > '11 12 25  '$ ALKPHOS 41 34*   < > 42 56 61  BILITOT 0.6 0.4  --  0.4  --   --   PROT 5.8* 5.1*  --  6.8  --   --   ALBUMIN 3.1* 2.7*   < > 2.9* 3.7 4.0   < > = values in this interval not displayed.   Recent Labs    10/13/21 0704 10/16/21 0328 10/23/21 0000 12/10/21 1601 12/29/21 0000 03/05/22 0000 03/17/22 0000  WBC 9.4 9.2   < > 11.1* 8.0 7.4 11.5  NEUTROABS  --   --    < > 7.3 3,584.00 4,544.00  --   HGB 11.6* 12.0   < > 10.4* 12.1  13.7 13.7  HCT 35.5* 36.7   < > 32.2* 37 41 42  MCV 96.7 94.8  --  98.2  --   --   --   PLT 282 365   < > 363 335 289 432*   < > = values in this interval not displayed.   No results found for: "TSH" Lab Results  Component Value Date   HGBA1C 7.4 03/17/2022   No results found for: "CHOL", "HDL", "LDLCALC", "LDLDIRECT", "TRIG", "CHOLHDL"  Significant Diagnostic Results in last 30 days:  CT Head Wo Contrast  Addendum Date: 03/03/2022   ADDENDUM REPORT: 03/03/2022 02:06 ADDENDUM: These results were called by telephone at the time of interpretation on 03/03/2022 at 1:13 am to provider [place] , who verbally acknowledged these results. Electronically Signed   By: Placido Sou M.D.   On: 03/03/2022 02:06   Result Date: 03/03/2022 CLINICAL DATA:  Fall from sitting position, hit left forehead EXAM: CT HEAD WITHOUT CONTRAST CT CERVICAL SPINE WITHOUT CONTRAST TECHNIQUE: Multidetector CT imaging of the head and cervical spine was performed following  the standard protocol without intravenous contrast. Multiplanar CT image reconstructions of the cervical spine were also generated. RADIATION DOSE REDUCTION: This exam was performed according to the departmental dose-optimization program which includes automated exposure control, adjustment of the mA and/or kV according to patient size and/or use of iterative reconstruction technique. COMPARISON:  CT 12/10/2021 FINDINGS: CT HEAD FINDINGS Brain: No intracranial hemorrhage, mass effect, or evidence of acute infarct. No hydrocephalus. No extra-axial fluid collection. Generalized cerebral atrophy. Ill-defined hypoattenuation within the cerebral white matter is nonspecific but consistent with chronic small vessel ischemic disease. Vascular: No hyperdense vessel. Calcification of the intracranial internal carotid arteries. Skull: 2.9 x 1.0 cm scalp hematoma overlying the left frontal calvarium. No underlying calvarial fracture Sinuses/Orbits: No acute finding.  Paranasal sinuses and mastoid air cells are well aerated. Other: None. CT CERVICAL SPINE FINDINGS Alignment: Multiple low-grade vertebral body subluxations are unchanged. Skull base and vertebrae: No acute fracture. No primary bone lesion or focal pathologic process. Inferior endplate deformities of T1 and T2 are unchanged. Soft tissues and spinal canal: No prevertebral fluid or swelling. No visible canal hematoma. Disc levels: Multilevel advanced spondylosis, disc space height loss and degenerative endplate changes and facet arthropathy greatest from C5-C7. Posterior disc osteophyte complexes cause multilevel spinal canal narrowing greatest at C7-T1 where it is mild-moderate in combination with ligamentum flavum calcifications. Uncovertebral spurring and facet arthropathy cause multilevel neural foraminal narrowing that is advanced at C3-C4 bilaterally, on the right at C4-C5, and bilaterally at C5-C6. These findings are not significantly changed from prior. Upper chest: 1.8 cm nodule in the left apex is partially visualized. Interlobular septal thickening and patchy ground-glass opacities in the apices. Other: None. IMPRESSION: 1. 1. No acute intracranial abnormality. Generalized atrophy and small vessel white matter disease. 2. Scalp hematoma overlying the left frontal calvarium. No underlying fracture. 3. No acute fracture in the cervical spine. Multilevel degenerative spondylosis. 4. 1.8 cm pulmonary nodule is partially visualized in the left apex. This may be infectious/inflammatory however is incompletely evaluated on the current exam. Follow-up evaluation with nonemergent chest CT is recommended. 5. Pulmonary edema in the lung apices. Electronically Signed: By: Placido Sou M.D. On: 03/03/2022 01:07   CT Cervical Spine Wo Contrast  Addendum Date: 03/03/2022   ADDENDUM REPORT: 03/03/2022 02:06 ADDENDUM: These results were called by telephone at the time of interpretation on 03/03/2022 at 1:13 am to  provider COURTNEY HORTON , who verbally acknowledged these results. Electronically Signed   By: Placido Sou M.D.   On: 03/03/2022 02:06   Result Date: 03/03/2022 CLINICAL DATA:  Fall from sitting position, hit left forehead EXAM: CT HEAD WITHOUT CONTRAST CT CERVICAL SPINE WITHOUT CONTRAST TECHNIQUE: Multidetector CT imaging of the head and cervical spine was performed following the standard protocol without intravenous contrast. Multiplanar CT image reconstructions of the cervical spine were also generated. RADIATION DOSE REDUCTION: This exam was performed according to the departmental dose-optimization program which includes automated exposure control, adjustment of the mA and/or kV according to patient size and/or use of iterative reconstruction technique. COMPARISON:  CT 12/10/2021 FINDINGS: CT HEAD FINDINGS Brain: No intracranial hemorrhage, mass effect, or evidence of acute infarct. No hydrocephalus. No extra-axial fluid collection. Generalized cerebral atrophy. Ill-defined hypoattenuation within the cerebral white matter is nonspecific but consistent with chronic small vessel ischemic disease. Vascular: No hyperdense vessel. Calcification of the intracranial internal carotid arteries. Skull: 2.9 x 1.0 cm scalp hematoma overlying the left frontal calvarium. No underlying calvarial fracture Sinuses/Orbits: No acute finding. Paranasal sinuses and  mastoid air cells are well aerated. Other: None. CT CERVICAL SPINE FINDINGS Alignment: Multiple low-grade vertebral body subluxations are unchanged. Skull base and vertebrae: No acute fracture. No primary bone lesion or focal pathologic process. Inferior endplate deformities of T1 and T2 are unchanged. Soft tissues and spinal canal: No prevertebral fluid or swelling. No visible canal hematoma. Disc levels: Multilevel advanced spondylosis, disc space height loss and degenerative endplate changes and facet arthropathy greatest from C5-C7. Posterior disc osteophyte  complexes cause multilevel spinal canal narrowing greatest at C7-T1 where it is mild-moderate in combination with ligamentum flavum calcifications. Uncovertebral spurring and facet arthropathy cause multilevel neural foraminal narrowing that is advanced at C3-C4 bilaterally, on the right at C4-C5, and bilaterally at C5-C6. These findings are not significantly changed from prior. Upper chest: 1.8 cm nodule in the left apex is partially visualized. Interlobular septal thickening and patchy ground-glass opacities in the apices. Other: None. IMPRESSION: 1. 1. No acute intracranial abnormality. Generalized atrophy and small vessel white matter disease. 2. Scalp hematoma overlying the left frontal calvarium. No underlying fracture. 3. No acute fracture in the cervical spine. Multilevel degenerative spondylosis. 4. 1.8 cm pulmonary nodule is partially visualized in the left apex. This may be infectious/inflammatory however is incompletely evaluated on the current exam. Follow-up evaluation with nonemergent chest CT is recommended. 5. Pulmonary edema in the lung apices. Electronically Signed: By: Placido Sou M.D. On: 03/03/2022 01:07    Assessment/Plan UTI (urinary tract infection) Cipro '500mg'$  bid x 5 days started 03/20/22, UA showed +nitrite, packed wbc, 3+ leukocyte esterase, urine culture indicated susceptible to Cipro.    Frequent falls Intensive supervision/assistance for safety.   Osteoarthritis, multiple sites  C/o left groin pain, left rib pain under the left scapula, X-ray 03/11/22 L hip/femur, pelvis, ribs: no acute fx or dislocation.  Healed subacute pelvic-right superior pubic ramus fx, T12, L1 Prn Tramadol/Tylenol,  Gabapentin for pain.  Cough Acute cough, better, after Furosemide 7 days started 03/09/22 and Medrol dose pk,  unremarkable  BMP/CBC dif, CXR 03/09/22 no mass. Infiltrate, or consolidation, peribronchial cuffing likely represent acute vs chronic bronchial  inflammation.  Dysphagia Dysphagia III diet.   Dementia ([facility]) Intermittent agitation, better.   Chronic a-fib ([facility])  heart rate is in control, takes ASA '81mg'$  qd, Hgb 13.7 03/17/22  Essential hypertension Blood pressure is controlled,  takes Cozaar, Bun/creat 28/0.9 03/17/22  Diabetes ([place])  on diet, Hgb a1c 6.8 10/11/21  Osteoporosis takes Prolia, Ca, Vit D  Anxiety due to dementia [facility])  Depression/Anxiety, taking Quetiapine, Mirtazapine, Depakote, positive reponses,  anxiety, irritation, and restlessness .     Family/ staff Communication: plan of care reviewed with the patient and charge nurse.   Labs/tests ordered:  none  Time spend 35 minutes

## 2022-03-23 NOTE — Assessment & Plan Note (Signed)
heart rate is in control, takes ASA 81mg qd, Hgb 13.7 03/17/22 

## 2022-03-23 NOTE — Assessment & Plan Note (Signed)
takes Prolia, Ca, Vit D 

## 2022-03-23 NOTE — Assessment & Plan Note (Signed)
Blood pressure is controlled,  takes Cozaar, Bun/creat 28/0.9 03/17/22

## 2022-03-23 NOTE — Assessment & Plan Note (Signed)
Depression/Anxiety, taking Quetiapine, Mirtazapine, Depakote, positive reponses,  anxiety, irritation, and restlessness .

## 2022-03-23 NOTE — Assessment & Plan Note (Signed)
Intensive supervision/assistance for safety.

## 2022-03-23 NOTE — Assessment & Plan Note (Addendum)
Cipro '500mg'$  bid x 5 days started 03/20/22, UA showed +nitrite, packed wbc, 3+ leukocyte esterase, urine culture indicated susceptible to Cipro.

## 2022-03-23 NOTE — Assessment & Plan Note (Signed)
Dysphagia III diet.  

## 2022-03-23 NOTE — Assessment & Plan Note (Signed)
Acute cough, better, after Furosemide 7 days started 03/09/22 and Medrol dose pk,  unremarkable  BMP/CBC dif, CXR 03/09/22 no mass. Infiltrate, or consolidation, peribronchial cuffing likely represent acute vs chronic bronchial inflammation.

## 2022-03-23 NOTE — Assessment & Plan Note (Signed)
on diet, Hgb a1c 6.8 10/11/21

## 2022-03-23 NOTE — Assessment & Plan Note (Signed)
Intermittent agitation, better.

## 2022-03-24 DIAGNOSIS — F03C0 Unspecified dementia, severe, without behavioral disturbance, psychotic disturbance, mood disturbance, and anxiety: Secondary | ICD-10-CM | POA: Diagnosis not present

## 2022-03-24 DIAGNOSIS — R2681 Unsteadiness on feet: Secondary | ICD-10-CM | POA: Diagnosis not present

## 2022-03-24 DIAGNOSIS — R1312 Dysphagia, oropharyngeal phase: Secondary | ICD-10-CM | POA: Diagnosis not present

## 2022-03-24 DIAGNOSIS — M6281 Muscle weakness (generalized): Secondary | ICD-10-CM | POA: Diagnosis not present

## 2022-03-24 DIAGNOSIS — R1310 Dysphagia, unspecified: Secondary | ICD-10-CM | POA: Diagnosis not present

## 2022-03-24 DIAGNOSIS — R41841 Cognitive communication deficit: Secondary | ICD-10-CM | POA: Diagnosis not present

## 2022-03-25 DIAGNOSIS — R41841 Cognitive communication deficit: Secondary | ICD-10-CM | POA: Diagnosis not present

## 2022-03-25 DIAGNOSIS — R2681 Unsteadiness on feet: Secondary | ICD-10-CM | POA: Diagnosis not present

## 2022-03-25 DIAGNOSIS — R1310 Dysphagia, unspecified: Secondary | ICD-10-CM | POA: Diagnosis not present

## 2022-03-25 DIAGNOSIS — R1312 Dysphagia, oropharyngeal phase: Secondary | ICD-10-CM | POA: Diagnosis not present

## 2022-03-25 DIAGNOSIS — M6281 Muscle weakness (generalized): Secondary | ICD-10-CM | POA: Diagnosis not present

## 2022-03-25 DIAGNOSIS — F03C0 Unspecified dementia, severe, without behavioral disturbance, psychotic disturbance, mood disturbance, and anxiety: Secondary | ICD-10-CM | POA: Diagnosis not present

## 2022-03-26 DIAGNOSIS — R41841 Cognitive communication deficit: Secondary | ICD-10-CM | POA: Diagnosis not present

## 2022-03-26 DIAGNOSIS — M6281 Muscle weakness (generalized): Secondary | ICD-10-CM | POA: Diagnosis not present

## 2022-03-26 DIAGNOSIS — R1312 Dysphagia, oropharyngeal phase: Secondary | ICD-10-CM | POA: Diagnosis not present

## 2022-03-26 DIAGNOSIS — F03C0 Unspecified dementia, severe, without behavioral disturbance, psychotic disturbance, mood disturbance, and anxiety: Secondary | ICD-10-CM | POA: Diagnosis not present

## 2022-03-26 DIAGNOSIS — R2681 Unsteadiness on feet: Secondary | ICD-10-CM | POA: Diagnosis not present

## 2022-03-26 DIAGNOSIS — R1310 Dysphagia, unspecified: Secondary | ICD-10-CM | POA: Diagnosis not present

## 2022-03-29 DIAGNOSIS — R1312 Dysphagia, oropharyngeal phase: Secondary | ICD-10-CM | POA: Diagnosis not present

## 2022-03-29 DIAGNOSIS — M6281 Muscle weakness (generalized): Secondary | ICD-10-CM | POA: Diagnosis not present

## 2022-03-29 DIAGNOSIS — R2681 Unsteadiness on feet: Secondary | ICD-10-CM | POA: Diagnosis not present

## 2022-03-29 DIAGNOSIS — R1310 Dysphagia, unspecified: Secondary | ICD-10-CM | POA: Diagnosis not present

## 2022-03-29 DIAGNOSIS — R41841 Cognitive communication deficit: Secondary | ICD-10-CM | POA: Diagnosis not present

## 2022-03-29 DIAGNOSIS — F03C0 Unspecified dementia, severe, without behavioral disturbance, psychotic disturbance, mood disturbance, and anxiety: Secondary | ICD-10-CM | POA: Diagnosis not present

## 2022-03-30 DIAGNOSIS — R41841 Cognitive communication deficit: Secondary | ICD-10-CM | POA: Diagnosis not present

## 2022-03-30 DIAGNOSIS — F03C0 Unspecified dementia, severe, without behavioral disturbance, psychotic disturbance, mood disturbance, and anxiety: Secondary | ICD-10-CM | POA: Diagnosis not present

## 2022-03-30 DIAGNOSIS — R1312 Dysphagia, oropharyngeal phase: Secondary | ICD-10-CM | POA: Diagnosis not present

## 2022-03-30 DIAGNOSIS — R1310 Dysphagia, unspecified: Secondary | ICD-10-CM | POA: Diagnosis not present

## 2022-03-30 DIAGNOSIS — M6281 Muscle weakness (generalized): Secondary | ICD-10-CM | POA: Diagnosis not present

## 2022-03-30 DIAGNOSIS — R2681 Unsteadiness on feet: Secondary | ICD-10-CM | POA: Diagnosis not present

## 2022-04-01 DIAGNOSIS — F03C0 Unspecified dementia, severe, without behavioral disturbance, psychotic disturbance, mood disturbance, and anxiety: Secondary | ICD-10-CM | POA: Diagnosis not present

## 2022-04-01 DIAGNOSIS — R1312 Dysphagia, oropharyngeal phase: Secondary | ICD-10-CM | POA: Diagnosis not present

## 2022-04-01 DIAGNOSIS — R1310 Dysphagia, unspecified: Secondary | ICD-10-CM | POA: Diagnosis not present

## 2022-04-01 DIAGNOSIS — R41841 Cognitive communication deficit: Secondary | ICD-10-CM | POA: Diagnosis not present

## 2022-04-01 DIAGNOSIS — M6281 Muscle weakness (generalized): Secondary | ICD-10-CM | POA: Diagnosis not present

## 2022-04-01 DIAGNOSIS — R2681 Unsteadiness on feet: Secondary | ICD-10-CM | POA: Diagnosis not present

## 2022-04-03 DIAGNOSIS — M6281 Muscle weakness (generalized): Secondary | ICD-10-CM | POA: Diagnosis not present

## 2022-04-03 DIAGNOSIS — F03C0 Unspecified dementia, severe, without behavioral disturbance, psychotic disturbance, mood disturbance, and anxiety: Secondary | ICD-10-CM | POA: Diagnosis not present

## 2022-04-03 DIAGNOSIS — R2681 Unsteadiness on feet: Secondary | ICD-10-CM | POA: Diagnosis not present

## 2022-04-03 DIAGNOSIS — R41841 Cognitive communication deficit: Secondary | ICD-10-CM | POA: Diagnosis not present

## 2022-04-03 DIAGNOSIS — R1310 Dysphagia, unspecified: Secondary | ICD-10-CM | POA: Diagnosis not present

## 2022-04-03 DIAGNOSIS — R1312 Dysphagia, oropharyngeal phase: Secondary | ICD-10-CM | POA: Diagnosis not present

## 2022-04-05 DIAGNOSIS — R1312 Dysphagia, oropharyngeal phase: Secondary | ICD-10-CM | POA: Diagnosis not present

## 2022-04-05 DIAGNOSIS — R1310 Dysphagia, unspecified: Secondary | ICD-10-CM | POA: Diagnosis not present

## 2022-04-05 DIAGNOSIS — R2681 Unsteadiness on feet: Secondary | ICD-10-CM | POA: Diagnosis not present

## 2022-04-05 DIAGNOSIS — R41841 Cognitive communication deficit: Secondary | ICD-10-CM | POA: Diagnosis not present

## 2022-04-05 DIAGNOSIS — M6281 Muscle weakness (generalized): Secondary | ICD-10-CM | POA: Diagnosis not present

## 2022-04-05 DIAGNOSIS — F03C0 Unspecified dementia, severe, without behavioral disturbance, psychotic disturbance, mood disturbance, and anxiety: Secondary | ICD-10-CM | POA: Diagnosis not present

## 2022-04-07 DIAGNOSIS — R1310 Dysphagia, unspecified: Secondary | ICD-10-CM | POA: Diagnosis not present

## 2022-04-07 DIAGNOSIS — M6281 Muscle weakness (generalized): Secondary | ICD-10-CM | POA: Diagnosis not present

## 2022-04-07 DIAGNOSIS — F03C0 Unspecified dementia, severe, without behavioral disturbance, psychotic disturbance, mood disturbance, and anxiety: Secondary | ICD-10-CM | POA: Diagnosis not present

## 2022-04-07 DIAGNOSIS — R1312 Dysphagia, oropharyngeal phase: Secondary | ICD-10-CM | POA: Diagnosis not present

## 2022-04-07 DIAGNOSIS — R41841 Cognitive communication deficit: Secondary | ICD-10-CM | POA: Diagnosis not present

## 2022-04-07 DIAGNOSIS — R2681 Unsteadiness on feet: Secondary | ICD-10-CM | POA: Diagnosis not present

## 2022-04-08 ENCOUNTER — Encounter: Payer: Self-pay | Admitting: Nurse Practitioner

## 2022-04-08 ENCOUNTER — Non-Acute Institutional Stay (SKILLED_NURSING_FACILITY): Payer: Medicare Other | Admitting: Nurse Practitioner

## 2022-04-08 DIAGNOSIS — F0394 Unspecified dementia, unspecified severity, with anxiety: Secondary | ICD-10-CM

## 2022-04-08 DIAGNOSIS — E871 Hypo-osmolality and hyponatremia: Secondary | ICD-10-CM | POA: Diagnosis not present

## 2022-04-08 DIAGNOSIS — N390 Urinary tract infection, site not specified: Secondary | ICD-10-CM

## 2022-04-08 DIAGNOSIS — E0800 Diabetes mellitus due to underlying condition with hyperosmolarity without nonketotic hyperglycemic-hyperosmolar coma (NKHHC): Secondary | ICD-10-CM | POA: Diagnosis not present

## 2022-04-08 DIAGNOSIS — I10 Essential (primary) hypertension: Secondary | ICD-10-CM

## 2022-04-08 DIAGNOSIS — R1310 Dysphagia, unspecified: Secondary | ICD-10-CM

## 2022-04-08 DIAGNOSIS — I482 Chronic atrial fibrillation, unspecified: Secondary | ICD-10-CM

## 2022-04-08 DIAGNOSIS — F03C11 Unspecified dementia, severe, with agitation: Secondary | ICD-10-CM

## 2022-04-08 DIAGNOSIS — E119 Type 2 diabetes mellitus without complications: Secondary | ICD-10-CM | POA: Diagnosis not present

## 2022-04-08 LAB — HEPATIC FUNCTION PANEL
ALT: 13 U/L (ref 7–35)
AST: 19 (ref 13–35)
Alkaline Phosphatase: 55 (ref 25–125)
Bilirubin, Total: 0.2

## 2022-04-08 LAB — BASIC METABOLIC PANEL
BUN: 31 — AB (ref 4–21)
CO2: 25 — AB (ref 13–22)
Chloride: 99 (ref 99–108)
Creatinine: 0.9 (ref 0.5–1.1)
Glucose: 151
Potassium: 4.9 mEq/L (ref 3.5–5.1)
Sodium: 132 — AB (ref 137–147)

## 2022-04-08 LAB — COMPREHENSIVE METABOLIC PANEL
Albumin: 3.7 (ref 3.5–5.0)
Calcium: 9.3 (ref 8.7–10.7)
eGFR: 60

## 2022-04-08 LAB — CBC AND DIFFERENTIAL
HCT: 37 (ref 36–46)
Hemoglobin: 12.2 (ref 12.0–16.0)
Neutrophils Absolute: 4722
Platelets: 384 10*3/uL (ref 150–400)
WBC: 8.1

## 2022-04-08 LAB — CBC: RBC: 3.92 (ref 3.87–5.11)

## 2022-04-08 NOTE — Assessment & Plan Note (Signed)
Controlled,  on diet, Hgb a1c 6.8 10/11/21

## 2022-04-08 NOTE — Assessment & Plan Note (Signed)
intermittent agitation

## 2022-04-08 NOTE — Assessment & Plan Note (Signed)
heart rate is in control, takes ASA 81mg qd, Hgb 13.7 03/17/22 

## 2022-04-08 NOTE — Assessment & Plan Note (Signed)
Dysphagia III diet.  

## 2022-04-08 NOTE — Assessment & Plan Note (Addendum)
reported the patient's increased agitation, irritability, c/o lower back pain, no spinal spinous process tenderness palpated. The patient is poor historian, unable to specify if she had dysuria, urinary frequency or urgency. She is afebrile. Last treated UTI 03/20/22 with 5 day course of Cipro. Will update CBC/diff, CMP/eGFR, UA C/S, observe the patient for developing s/s of UTI.  04/08/22 wbc 8.1, Hgb 12.2, plt 384, neutrophils 58.3, Na 132, K 4.9, Bun 31, creat 0.89

## 2022-04-08 NOTE — Assessment & Plan Note (Addendum)
Na 138 03/17/22 04/08/22 wbc 8.1, Hgb 12.2, plt 384, neutrophils 58.3, Na 132, K 4.9, Bun 31, creat 0.89 BMP one week.

## 2022-04-08 NOTE — Progress Notes (Signed)
Location:   [facility] [place] Room Number: NO/35/A Place of Service:  SNF (31) Provider: Marlana Latus NP  Patient Care Team: Virgie Dad, MD as PCP - General (Internal Medicine)  Extended Emergency Contact Information Primary Emergency Contact: Armstrong Mobile Phone: [phone] Relation: Grandson Interpreter needed? No Secondary Emergency Contact: McLean Mobile Phone: [phone] Relation: Granddaughter  Code Status: DNR Goals of care: Advanced Directive information    03/23/2022   10:39 AM  Advanced Directives  Does Patient Have a Medical Advance Directive? Yes  Type of Paramedic of [place];Living will;Out of facility DNR (pink MOST or yellow form)  Does patient want to make changes to medical advance directive? No - Patient declined  Copy of [place] in Chart? Yes - validated most recent copy scanned in chart (See row information)     Chief Complaint  Patient presents with   Acute Visit    Increased agitation, irritable, c/o lower back pain.     HPI:  Pt is a 88 y.o. female seen today for an acute visit for reported the patient's increased agitation, irritability, c/o lower back pain, no spinal spinous process tenderness palpated. The patient is poor historian, unable to specify if she had dysuria, urinary frequency or urgency. She is afebrile. Last treated UTI 03/20/22 with 5 day course of Cipro for   Dysphagia III diet             Dementia, intermittent agitation             Chronic Afib, heart rate is in control, takes ASA 33m qd, Hgb 13.7 03/17/22             HTN, takes Cozaar, Bun/creat 28/0.9 03/17/22             T2DM, on diet, Hgb a1c 6.8 10/11/21             OP, takes Prolia, Ca, Vit D             Depression/Anxiety, taking Quetiapine, Mirtazapine, Depakote, positive reponses,  anxiety, irritation, and restlessness .             Hyponatremia, Na 138 03/17/22   Past Medical History:  Diagnosis  Date   Anxiety    Arthritis    [place]    hx of skin cancer removal on hand    Colitis    Diabetes mellitus without complication ([place]    Gallstones    Hyperlipidemia    Hypertension    IBS (irritable bowel syndrome)    Osteoporosis    Past Surgical History:  Procedure Laterality Date   APPENDECTOMY     BACK SURGERY     x2   cataract [place]      [place]    left eye peeling surgery      left femur bone surgery      1978   REVERSE SHOULDER ARTHROPLASTY Right 10/10/2014   Procedure: RIGHT REVERSE SHOULDER ARTHROPLASTY;  Surgeon: KJustice Britain MD;  [facility]  Service: Orthopedics;  Laterality: Right;   ROTATOR CUFF REPAIR     TOTAL HIP ARTHROPLASTY Left 02/20/2014   Procedure: LEFT TOTAL HIP ARTHROPLASTY ANTERIOR APPROACH;  Surgeon: FGearlean Alf MD;  Location: [facility];  Service: Orthopedics;  Laterality: Left;   WRIST SURGERY     left     Allergies  Allergen Reactions  Robaxin [Methocarbamol] Rash and Other (See Comments)    Red rash on right side of face and forehead cleared after benadryl given, per [name], RN   Cefazolin Diarrhea   Dilaudid [Hydromorphone] Other (See Comments)    Stomach upset   Fosamax [Alendronate] Other (See Comments)    Upset stomach   Hyoscyamine Other (See Comments)    "Caused gas"   Morphine And Related Itching   Nsaids Diarrhea   Parathyroid Hormone (Recomb) Other (See Comments)    was not able to give herself the shot   Penicillins Itching    Tolerated Unasyn Has patient had a PCN reaction causing immediate rash, facial/tongue/throat swelling, SOB or lightheadedness with hypotension: Yes Has patient had a PCN reaction causing severe rash involving mucus membranes or skin necrosis: No Has patient had a PCN reaction that required hospitalization: Was in hospital Has patient had a PCN reaction occurring within the last 10 years: No If all of the above answers are "NO",  then may proceed with Cephalosporin use.    Tape Other (See Comments)    Bandages and leads cause bruising and weeping at sites   Flurbiprofen Diarrhea    Allergies as of 04/08/2022       Reactions   Robaxin [methocarbamol] Rash, Other (See Comments)   Red rash on right side of face and forehead cleared after benadryl given, per [name], RN   Cefazolin Diarrhea   Dilaudid [hydromorphone] Other (See Comments)   Stomach upset   Fosamax [alendronate] Other (See Comments)   Upset stomach   Hyoscyamine Other (See Comments)   "Caused gas"   Morphine And Related Itching   Nsaids Diarrhea   Parathyroid Hormone (recomb) Other (See Comments)   was not able to give herself the shot   Penicillins Itching   Tolerated Unasyn Has patient had a PCN reaction causing immediate rash, facial/tongue/throat swelling, SOB or lightheadedness with hypotension: Yes Has patient had a PCN reaction causing severe rash involving mucus membranes or skin necrosis: No Has patient had a PCN reaction that required hospitalization: Was in hospital Has patient had a PCN reaction occurring within the last 10 years: No If all of the above answers are "NO", then may proceed with Cephalosporin use.   Tape Other (See Comments)   Bandages and leads cause bruising and weeping at sites   Flurbiprofen Diarrhea        Medication List        Accurate as of April 08, 2022 11:59 PM. If you have any questions, ask your nurse or doctor.          acetaminophen 500 MG tablet Commonly known as: TYLENOL Take 2 tablets (1,000 mg total) by mouth 3 (three) times daily as needed.   aspirin EC 81 MG tablet Take 81 mg by mouth daily.   calcium carbonate 1500 (600 Ca) MG Tabs tablet Commonly known as: OSCAL Take 600 mg of elemental calcium by mouth daily.   Cholecalciferol 25 MCG (1000 UT) tablet Take 1,000 Units by mouth daily.   denosumab 60 MG/ML Sosy injection Commonly known as: PROLIA Inject 60 mg into the  skin every 6 (six) months. Last injection 06/02/21   divalproex 125 MG capsule Commonly known as: DEPAKOTE SPRINKLE Take 125 mg by mouth 2 (two) times daily.   DSS 100 MG Caps Take 100 mg by mouth 2 (two) times daily.   gabapentin 100 MG capsule Commonly known as: NEURONTIN Take 200 mg by mouth 2 (two) times daily.  lactose free nutrition Liqd Take 237 mLs by mouth 2 (two) times daily between meals.   loratadine 10 MG tablet Commonly known as: CLARITIN Take 10 mg by mouth daily as needed for allergies.   LORazepam 0.5 MG tablet Commonly known as: ATIVAN Take 0.5 mg by mouth every 6 (six) hours as needed for anxiety.   losartan 50 MG tablet Commonly known as: COZAAR Take 1 tablet (50 mg total) by mouth daily.   MELATONIN PO Take 3 mg by mouth at bedtime as needed (sleep).   mirtazapine 15 MG tablet Commonly known as: REMERON Take 15 mg by mouth at bedtime.   PreserVision AREDS 2 Caps Take 1 capsule by mouth daily.   QUEtiapine 25 MG tablet Commonly known as: SEROQUEL Take 12.5 mg by mouth 2 (two) times daily.   traMADol 50 MG tablet Commonly known as: ULTRAM Take 0.5 tablets (25 mg total) by mouth every 8 (eight) hours as needed.        Review of Systems  Constitutional:  Negative for appetite change, fatigue and fever.  HENT:  Positive for hearing loss and trouble swallowing. Negative for congestion.   Eyes:  Positive for visual disturbance.       Blind right eye.   Respiratory:  Negative for cough and shortness of breath.   Cardiovascular:  Negative for leg swelling.  Gastrointestinal:  Negative for abdominal pain and constipation.  Genitourinary:  Positive for frequency. Negative for dysuria, hematuria and urgency.  Musculoskeletal:  Positive for arthralgias, back pain and gait problem.       C/o lower back pain  Skin:  Positive for wound.  Neurological:  Negative for speech difficulty, weakness and headaches.       Memory difficulty.    Psychiatric/Behavioral:  Positive for behavioral problems, confusion and sleep disturbance. The patient is nervous/anxious.     Immunization History  Administered Date(s) Administered   Influenza Split 07/04/2009, 04/09/2010, 05/06/2011, 03/29/2012, 05/03/2013   Influenza, High Dose Seasonal PF 05/02/2013, 05/25/2017, 04/26/2018, 04/24/2019   Influenza, Quadrivalent, Recombinant, Inj, Pf 04/26/2020   Influenza,inj,Quad PF,6+ Mos 05/26/2021   Influenza,inj,quad, With Preservative 04/18/2017   Influenza-Unspecified 05/12/2015   PFIZER(Purple Top)SARS-COV-2 Vaccination 08/25/2019, 09/19/2019   Pfizer Covid-19 Vaccine Bivalent Booster 36yr & up 05/01/2020, 01/20/2021, 10/27/2021   Pneumococcal Conjugate-13 01/29/2014   Pneumococcal Polysaccharide-23 06/07/2007   Td 05/02/2003   Tdap 10/18/2011   Zoster, Live 01/17/2006   Pertinent  Health Maintenance Due  Topic Date Due   FOOT EXAM  Never done   OPHTHALMOLOGY EXAM  Never done   DEXA SCAN  Never done   INFLUENZA VACCINE  02/16/2022   HEMOGLOBIN A1C  09/16/2022      10/15/2021    7:42 AM 10/16/2021   12:00 AM 10/16/2021    9:00 AM 12/10/2021    4:03 PM 03/02/2022   11:50 PM  Fall Risk  Patient Fall Risk Level High fall risk High fall risk High fall risk High fall risk High fall risk   Functional Status Survey:    Vitals:   04/08/22 1147  BP: (!) 135/58  Pulse: 73  Resp: (!) 24  Temp: (!) 97.3 F (36.3 C)  SpO2: 97%  Weight: 134 lb (60.8 kg)  Height: 5' 2"  (1.575 m)   Body mass index is 24.51 kg/m. Physical Exam Nursing note reviewed.  Constitutional:      Appearance: Normal appearance.  HENT:     Head: Normocephalic and atraumatic.     Nose: Nose normal.  Mouth/Throat:     Mouth: Mucous membranes are moist.  Eyes:     Extraocular Movements: Extraocular movements intact.     Conjunctiva/sclera: Conjunctivae normal.     Pupils: Pupils are equal, round, and reactive to light.     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   Cardiovascular:     Rate and Rhythm: Normal rate and regular rhythm.     Heart sounds: No murmur heard. Pulmonary:     Effort: Pulmonary effort is normal.     Breath sounds: No wheezing, rhonchi or rales.  Abdominal:     General: Bowel sounds are normal.     Palpations: Abdomen is soft.     Tenderness: There is no abdominal tenderness. There is no right CVA tenderness, guarding or rebound.  Musculoskeletal:        General: Tenderness present.     Cervical back: Normal range of motion and neck supple.     Right lower leg: No edema.     Left lower leg: No edema.     Comments: No spinal spinous processes tenderness palpated.   Skin:    General: Skin is warm and dry.     Findings: No bruising.  Neurological:     General: No focal deficit present.     Mental Status: She is alert. Mental status is at baseline.     Motor: No weakness.     Coordination: Coordination normal.     Gait: Gait abnormal.     Comments: Oriented to person  Psychiatric:        Mood and Affect: Mood normal.     Labs reviewed: Recent Labs    10/15/21 0640 10/16/21 0328 10/23/21 0000 12/10/21 1601 12/10/21 1823 12/29/21 0000 02/02/22 0000 03/05/22 0000 03/17/22 0000  NA 133* 137   < > 137 134*   < > 140 133* 138  K 3.1* 4.0   < > 5.3* 5.1   < > 3.6 5.0 4.9  CL 101 111   < > 100 101   < > 101 97* 98*  CO2 23 21*   < > 27 27   < > 30* 23* 32*  GLUCOSE 118* 105*  --  99 112*  --   --   --   --   BUN 11 15   < > 38* 35*   < > 40* 40* 28*  CREATININE 0.60 0.61   < > 0.95 0.91   < > 1.2* 1.1 0.9  CALCIUM 8.7* 8.4*   < > 9.4 9.0   < > 9.9 9.5 10.0  MG 1.7  --   --   --   --   --   --   --   --   PHOS 2.7  --   --   --   --   --   --   --   --    < > = values in this interval not displayed.   Recent Labs    10/11/21 0325 10/12/21 0322 10/23/21 0000 12/10/21 1601 12/29/21 0000 03/05/22 0000  AST 49* 39   < > 15 18 29   ALT 25 24   < > 11 12 25   ALKPHOS 41 34*   < > 42 56  61  BILITOT 0.6 0.4  --  0.4  --   --   PROT 5.8* 5.1*  --  6.8  --   --   ALBUMIN 3.1* 2.7*   < >  2.9* 3.7 4.0   < > = values in this interval not displayed.   Recent Labs    10/13/21 0704 10/16/21 0328 10/23/21 0000 12/10/21 1601 12/29/21 0000 03/05/22 0000 03/17/22 0000  WBC 9.4 9.2   < > 11.1* 8.0 7.4 11.5  NEUTROABS  --   --    < > 7.3 3,584.00 4,544.00  --   HGB 11.6* 12.0   < > 10.4* 12.1 13.7 13.7  HCT 35.5* 36.7   < > 32.2* 37 41 42  MCV 96.7 94.8  --  98.2  --   --   --   PLT 282 365   < > 363 335 289 432*   < > = values in this interval not displayed.   No results found for: "TSH" Lab Results  Component Value Date   HGBA1C 7.4 03/17/2022   No results found for: "CHOL", "HDL", "LDLCALC", "LDLDIRECT", "TRIG", "CHOLHDL"  Significant Diagnostic Results in last 30 days:  No results found.  Assessment/Plan: UTI (urinary tract infection) reported the patient's increased agitation, irritability, c/o lower back pain, no spinal spinous process tenderness palpated. The patient is poor historian, unable to specify if she had dysuria, urinary frequency or urgency. She is afebrile. Last treated UTI 03/20/22 with 5 day course of Cipro. Will update CBC/diff, CMP/eGFR, UA C/S, observe the patient for developing s/s of UTI.  04/08/22 wbc 8.1, Hgb 12.2, plt 384, neutrophils 58.3, Na 132, K 4.9, Bun 31, creat 0.89  Dysphagia Dysphagia III diet  Dementia (HCC) intermittent agitation  Chronic a-fib (HCC) heart rate is in control, takes ASA 52m qd, Hgb 13.7 03/17/22  Essential hypertension Blood pressure is controlled, takes Cozaar, Bun/creat 28/0.9 03/17/22  Diabetes ([place] Cont[place],  on diet, Hgb a1c 6.8 10/11/21  [place] due to [place] ([place]  taking Quetiapine, Mirtazapine, Depakote, positive reponses,  anxiety, irritation, and restlessness .  Hyponatremia  Na 138 03/17/22 04/08/22 wbc 8.1, Hgb 12.2, plt 384, neutrophils 58.3, Na 132, K 4.9, Bun 31, creat 0.89 BMP one  week.     Family/ staff Communication: plan of care reviewed with the patient and charge nurse .  Labs/tests ordered:  CBC/diff, CMP/eGFR, UA C/S

## 2022-04-08 NOTE — Assessment & Plan Note (Signed)
Blood pressure is controlled, takes Cozaar, Bun/creat 28/0.9 03/17/22

## 2022-04-08 NOTE — Assessment & Plan Note (Signed)
taking Quetiapine, Mirtazapine, Depakote, positive reponses,  anxiety, irritation, and restlessness .

## 2022-04-09 DIAGNOSIS — F03C0 Unspecified dementia, severe, without behavioral disturbance, psychotic disturbance, mood disturbance, and anxiety: Secondary | ICD-10-CM | POA: Diagnosis not present

## 2022-04-09 DIAGNOSIS — R1310 Dysphagia, unspecified: Secondary | ICD-10-CM | POA: Diagnosis not present

## 2022-04-09 DIAGNOSIS — R41841 Cognitive communication deficit: Secondary | ICD-10-CM | POA: Diagnosis not present

## 2022-04-09 DIAGNOSIS — R1312 Dysphagia, oropharyngeal phase: Secondary | ICD-10-CM | POA: Diagnosis not present

## 2022-04-09 DIAGNOSIS — R2681 Unsteadiness on feet: Secondary | ICD-10-CM | POA: Diagnosis not present

## 2022-04-09 DIAGNOSIS — M6281 Muscle weakness (generalized): Secondary | ICD-10-CM | POA: Diagnosis not present

## 2022-04-12 ENCOUNTER — Encounter: Payer: Self-pay | Admitting: Nurse Practitioner

## 2022-04-12 DIAGNOSIS — R2681 Unsteadiness on feet: Secondary | ICD-10-CM | POA: Diagnosis not present

## 2022-04-12 DIAGNOSIS — R1312 Dysphagia, oropharyngeal phase: Secondary | ICD-10-CM | POA: Diagnosis not present

## 2022-04-12 DIAGNOSIS — F411 Generalized anxiety disorder: Secondary | ICD-10-CM | POA: Diagnosis not present

## 2022-04-12 DIAGNOSIS — R41841 Cognitive communication deficit: Secondary | ICD-10-CM | POA: Diagnosis not present

## 2022-04-12 DIAGNOSIS — R1310 Dysphagia, unspecified: Secondary | ICD-10-CM | POA: Diagnosis not present

## 2022-04-12 DIAGNOSIS — F03C0 Unspecified dementia, severe, without behavioral disturbance, psychotic disturbance, mood disturbance, and anxiety: Secondary | ICD-10-CM | POA: Diagnosis not present

## 2022-04-12 DIAGNOSIS — F01B2 Vascular dementia, moderate, with psychotic disturbance: Secondary | ICD-10-CM | POA: Diagnosis not present

## 2022-04-12 DIAGNOSIS — F331 Major depressive disorder, recurrent, moderate: Secondary | ICD-10-CM | POA: Diagnosis not present

## 2022-04-12 DIAGNOSIS — M6281 Muscle weakness (generalized): Secondary | ICD-10-CM | POA: Diagnosis not present

## 2022-04-12 NOTE — Progress Notes (Signed)
This encounter was created in error - please disregard.

## 2022-04-13 DIAGNOSIS — M6281 Muscle weakness (generalized): Secondary | ICD-10-CM | POA: Diagnosis not present

## 2022-04-13 DIAGNOSIS — R1312 Dysphagia, oropharyngeal phase: Secondary | ICD-10-CM | POA: Diagnosis not present

## 2022-04-13 DIAGNOSIS — F331 Major depressive disorder, recurrent, moderate: Secondary | ICD-10-CM | POA: Diagnosis not present

## 2022-04-13 DIAGNOSIS — R2681 Unsteadiness on feet: Secondary | ICD-10-CM | POA: Diagnosis not present

## 2022-04-13 DIAGNOSIS — R1310 Dysphagia, unspecified: Secondary | ICD-10-CM | POA: Diagnosis not present

## 2022-04-13 DIAGNOSIS — F01B2 Vascular dementia, moderate, with psychotic disturbance: Secondary | ICD-10-CM | POA: Diagnosis not present

## 2022-04-13 DIAGNOSIS — F411 Generalized anxiety disorder: Secondary | ICD-10-CM | POA: Diagnosis not present

## 2022-04-13 DIAGNOSIS — F03C0 Unspecified dementia, severe, without behavioral disturbance, psychotic disturbance, mood disturbance, and anxiety: Secondary | ICD-10-CM | POA: Diagnosis not present

## 2022-04-13 DIAGNOSIS — R41841 Cognitive communication deficit: Secondary | ICD-10-CM | POA: Diagnosis not present

## 2022-04-14 DIAGNOSIS — F03C0 Unspecified dementia, severe, without behavioral disturbance, psychotic disturbance, mood disturbance, and anxiety: Secondary | ICD-10-CM | POA: Diagnosis not present

## 2022-04-14 DIAGNOSIS — R1312 Dysphagia, oropharyngeal phase: Secondary | ICD-10-CM | POA: Diagnosis not present

## 2022-04-14 DIAGNOSIS — R1310 Dysphagia, unspecified: Secondary | ICD-10-CM | POA: Diagnosis not present

## 2022-04-14 DIAGNOSIS — M6281 Muscle weakness (generalized): Secondary | ICD-10-CM | POA: Diagnosis not present

## 2022-04-14 DIAGNOSIS — R41841 Cognitive communication deficit: Secondary | ICD-10-CM | POA: Diagnosis not present

## 2022-04-14 DIAGNOSIS — R2681 Unsteadiness on feet: Secondary | ICD-10-CM | POA: Diagnosis not present

## 2022-04-15 DIAGNOSIS — M6281 Muscle weakness (generalized): Secondary | ICD-10-CM | POA: Diagnosis not present

## 2022-04-15 DIAGNOSIS — F03C0 Unspecified dementia, severe, without behavioral disturbance, psychotic disturbance, mood disturbance, and anxiety: Secondary | ICD-10-CM | POA: Diagnosis not present

## 2022-04-15 DIAGNOSIS — R41841 Cognitive communication deficit: Secondary | ICD-10-CM | POA: Diagnosis not present

## 2022-04-15 DIAGNOSIS — R2681 Unsteadiness on feet: Secondary | ICD-10-CM | POA: Diagnosis not present

## 2022-04-15 DIAGNOSIS — I10 Essential (primary) hypertension: Secondary | ICD-10-CM | POA: Diagnosis not present

## 2022-04-15 DIAGNOSIS — R1312 Dysphagia, oropharyngeal phase: Secondary | ICD-10-CM | POA: Diagnosis not present

## 2022-04-15 DIAGNOSIS — E119 Type 2 diabetes mellitus without complications: Secondary | ICD-10-CM | POA: Diagnosis not present

## 2022-04-15 DIAGNOSIS — R1310 Dysphagia, unspecified: Secondary | ICD-10-CM | POA: Diagnosis not present

## 2022-04-16 DIAGNOSIS — M6281 Muscle weakness (generalized): Secondary | ICD-10-CM | POA: Diagnosis not present

## 2022-04-16 DIAGNOSIS — R1310 Dysphagia, unspecified: Secondary | ICD-10-CM | POA: Diagnosis not present

## 2022-04-16 DIAGNOSIS — R2681 Unsteadiness on feet: Secondary | ICD-10-CM | POA: Diagnosis not present

## 2022-04-16 DIAGNOSIS — F03C0 Unspecified dementia, severe, without behavioral disturbance, psychotic disturbance, mood disturbance, and anxiety: Secondary | ICD-10-CM | POA: Diagnosis not present

## 2022-04-16 DIAGNOSIS — R41841 Cognitive communication deficit: Secondary | ICD-10-CM | POA: Diagnosis not present

## 2022-04-16 DIAGNOSIS — R1312 Dysphagia, oropharyngeal phase: Secondary | ICD-10-CM | POA: Diagnosis not present

## 2022-04-20 ENCOUNTER — Encounter: Payer: Self-pay | Admitting: Nurse Practitioner

## 2022-04-20 ENCOUNTER — Non-Acute Institutional Stay (SKILLED_NURSING_FACILITY): Payer: Medicare Other | Admitting: Nurse Practitioner

## 2022-04-20 DIAGNOSIS — E871 Hypo-osmolality and hyponatremia: Secondary | ICD-10-CM

## 2022-04-20 DIAGNOSIS — I482 Chronic atrial fibrillation, unspecified: Secondary | ICD-10-CM

## 2022-04-20 DIAGNOSIS — F0394 Unspecified dementia, unspecified severity, with anxiety: Secondary | ICD-10-CM | POA: Diagnosis not present

## 2022-04-20 DIAGNOSIS — E119 Type 2 diabetes mellitus without complications: Secondary | ICD-10-CM

## 2022-04-20 DIAGNOSIS — I10 Essential (primary) hypertension: Secondary | ICD-10-CM | POA: Diagnosis not present

## 2022-04-20 DIAGNOSIS — M810 Age-related osteoporosis without current pathological fracture: Secondary | ICD-10-CM | POA: Diagnosis not present

## 2022-04-20 DIAGNOSIS — F01C11 Vascular dementia, severe, with agitation: Secondary | ICD-10-CM | POA: Diagnosis not present

## 2022-04-20 NOTE — Assessment & Plan Note (Signed)
Na 138 03/17/22 

## 2022-04-20 NOTE — Assessment & Plan Note (Signed)
taking Quetiapine, Mirtazapine, Depakote, positive responses, less anxiety, irritation, and restlessness .

## 2022-04-20 NOTE — Assessment & Plan Note (Signed)
on diet, Hgb a1c 6.8 10/11/21

## 2022-04-20 NOTE — Progress Notes (Signed)
Location:  [facility] Room Number: NO/35/A Place of Service:  SNF (31) Provider:  Almyra Birman X, NP  Patient Care Team: Virgie Dad, MD as PCP - General (Internal Medicine)  Extended Emergency Contact Information Primary Emergency Contact: [place] Mobile Phone: [phone] Relation: Grandson Interpreter needed? No Secondary Emergency Contact: [place] Mobile Phone: [phone] Relation: Granddaughter  Code Status:  DNR Goals of care: Advanced Directive information    04/20/2022   12:30 PM  Advanced Directives  Does Patient Have a Medical Advance Directive? Yes  Type of Paramedic of [place];Living will;Out of facility DNR (pink MOST or yellow form)  Does patient want to make changes to medical advance directive? No - Patient declined  Copy of [place] in Chart? Yes - validated most recent copy scanned in chart (See row information)     Chief Complaint  Patient presents with  . Medical Management of Chronic Issues    Patient is here for a follow up for chronic conditions   . Quality Metric Gaps    Patient is due for foot exam, as well as ophthalmology exam, Dexa scan, also need to discuss updated vaccines    HPI:  Pt is a 88 y.o. female seen today for medical management of chronic diseases.     Dysphagia III diet             Dementia, intermittent agitation, improving.  03/03/22 CT head: Generalized cerebral atrophy, chronic small vessel ischemic disease.   Chronic Afib, heart rate is in control, takes ASA '81mg'$  qd, Hgb 13.7 03/17/22             HTN, takes Losartan,  Bun/creat 28/0.9 03/17/22             T2DM, on diet, Hgb a1c 6.8 10/11/21             OP, takes Prolia, Ca, Vit D             Depression/Anxiety, taking Quetiapine, Mirtazapine, Depakote, positive responses,  less anxiety, irritation, and restlessness .             Hyponatremia, Na 138 03/17/22  Past Medical History:   Diagnosis Date  . Anxiety   . Arthritis   . Cancer ([place])    hx of skin cancer removal on hand   . Colitis   . Diabetes mellitus without complication ([place])   . Gallstones   . Hyperlipidemia   . Hypertension   . IBS (irritable bowel syndrome)   . Osteoporosis    Past Surgical History:  Procedure Laterality Date  . APPENDECTOMY    . BACK SURGERY     x2  . cataract surgery      bilateral   . CHOLECYSTECTOMY  1999  . EYE SURGERY     for glaucoma   . left eye peeling surgery     . left femur bone surgery      1978  . REVERSE SHOULDER ARTHROPLASTY Right 10/10/2014   Procedure: RIGHT REVERSE SHOULDER ARTHROPLASTY;  Surgeon: Justice Britain, MD;  Location: [facility];  Service: Orthopedics;  Laterality: Right;  . ROTATOR CUFF REPAIR    . TOTAL HIP ARTHROPLASTY Left 02/20/2014   Procedure: LEFT TOTAL HIP ARTHROPLASTY ANTERIOR APPROACH;  Surgeon: Gearlean Alf, MD;  Location: [facility];  Service: Orthopedics;  Laterality: Left;  . WRIST SURGERY     left     Allergies  Allergen Reactions  . Robaxin [Methocarbamol] Rash  and Other (See Comments)    Red rash on right side of face and forehead cleared after benadryl given, per [name], RN  . Cefazolin Diarrhea  . Dilaudid [Hydromorphone] Other (See Comments)    Stomach upset  . Fosamax [Alendronate] Other (See Comments)    Upset stomach  . Hyoscyamine Other (See Comments)    "Caused gas"  . Morphine And Related Itching  . Nsaids Diarrhea  . Parathyroid Hormone (Recomb) Other (See Comments)    was not able to give herself the shot  . Penicillins Itching    Tolerated Unasyn Has patient had a PCN reaction causing immediate rash, facial/tongue/throat swelling, SOB or lightheadedness with hypotension: Yes Has patient had a PCN reaction causing severe rash involving mucus membranes or skin necrosis: No Has patient had a PCN reaction that required hospitalization: Was in hospital Has patient had a PCN reaction occurring within the last 10 years:  No If all of the above answers are "NO", then may proceed with Cephalosporin use.   . Tape Other (See Comments)    Bandages and leads cause bruising and weeping at sites  . Flurbiprofen Diarrhea    Outpatient Encounter Medications as of 04/20/2022  Medication Sig  . acetaminophen (TYLENOL) 500 MG tablet Take 2 tablets (1,000 mg total) by mouth 3 (three) times daily as needed.  Marland Kitchen aspirin EC 81 MG tablet Take 81 mg by mouth daily.  . calcium carbonate (OSCAL) 1500 (600 Ca) MG TABS tablet Take 600 mg of elemental calcium by mouth daily.  . Cholecalciferol 25 MCG (1000 UT) tablet Take 1,000 Units by mouth daily.  Marland Kitchen denosumab (PROLIA) 60 MG/ML SOSY injection Inject 60 mg into the skin every 6 (six) months. Last injection 06/02/21  . divalproex (DEPAKOTE SPRINKLE) 125 MG capsule Take 125 mg by mouth 2 (two) times daily.  Marland Kitchen docusate sodium 100 MG CAPS Take 100 mg by mouth 2 (two) times daily.  Marland Kitchen gabapentin (NEURONTIN) 100 MG capsule Take 200 mg by mouth 2 (two) times daily.  Marland Kitchen lactose free nutrition (BOOST) LIQD Take 237 mLs by mouth 2 (two) times daily between meals.  Marland Kitchen loratadine (CLARITIN) 10 MG tablet Take 10 mg by mouth daily as needed for allergies.  Marland Kitchen LORazepam (ATIVAN) 0.5 MG tablet Take 0.5 mg by mouth every 6 (six) hours as needed for anxiety.  Marland Kitchen losartan (COZAAR) 50 MG tablet Take 1 tablet (50 mg total) by mouth daily.  Marland Kitchen MELATONIN PO Take 3 mg by mouth at bedtime as needed (sleep).  . mirtazapine (REMERON) 15 MG tablet Take 15 mg by mouth at bedtime.  . Multiple Vitamins-Minerals (PRESERVISION AREDS 2) CAPS Take 1 capsule by mouth daily.  . QUEtiapine (SEROQUEL) 25 MG tablet Take 12.5 mg by mouth 2 (two) times daily.  . traMADol (ULTRAM) 50 MG tablet Take 0.5 tablets (25 mg total) by mouth every 8 (eight) hours as needed.   No facility-administered encounter medications on file as of 04/20/2022.    Review of Systems  Constitutional:  Negative for appetite change, fatigue and fever.   HENT:  Positive for hearing loss and trouble swallowing. Negative for congestion.   Eyes:  Positive for visual disturbance.       Blind right eye.   Respiratory:  Negative for cough and shortness of breath.   Cardiovascular:  Negative for leg swelling.  Gastrointestinal:  Negative for abdominal pain and constipation.  Genitourinary:  Positive for frequency. Negative for dysuria, hematuria and urgency.  Musculoskeletal:  Positive for arthralgias,  back pain and gait problem.       C/o lower back pain  Skin:  Negative for color change.  Neurological:  Negative for speech difficulty, weakness and headaches.       Memory difficulty.   Psychiatric/Behavioral:  Positive for behavioral problems and confusion. Negative for sleep disturbance. The patient is not nervous/anxious.     Immunization History  Administered Date(s) Administered  . Influenza Split 07/04/2009, 04/09/2010, 05/06/2011, 03/29/2012, 05/03/2013  . Influenza, High Dose Seasonal PF 05/02/2013, 05/25/2017, 04/26/2018, 04/24/2019  . Influenza, Quadrivalent, Recombinant, Inj, Pf 04/26/2020  . Influenza,inj,Quad PF,6+ Mos 05/26/2021  . Influenza,inj,quad, With Preservative 04/18/2017  . Influenza-Unspecified 05/12/2015  . PFIZER(Purple Top)SARS-COV-2 Vaccination 08/25/2019, 09/19/2019  . Pension scheme manager 30yr & up 05/01/2020, 01/20/2021, 10/27/2021  . Pneumococcal Conjugate-13 01/29/2014  . Pneumococcal Polysaccharide-23 06/07/2007  . Td 05/02/2003  . Tdap 10/18/2011  . Zoster, Live 01/17/2006   Pertinent  Health Maintenance Due  Topic Date Due  . FOOT EXAM  Never done  . OPHTHALMOLOGY EXAM  Never done  . DEXA SCAN  Never done  . INFLUENZA VACCINE  02/16/2022  . HEMOGLOBIN A1C  09/16/2022      10/16/2021   12:00 AM 10/16/2021    9:00 AM 12/10/2021    4:03 PM 03/02/2022   11:50 PM 04/20/2022   12:30 PM  Fall Risk  Falls in the past year?     0  Patient Fall Risk Level High fall risk High fall  risk High fall risk High fall risk Moderate fall risk  Patient at Risk for Falls Due to     History of fall(s)  Fall risk Follow up     Falls evaluation completed   Functional Status Survey:    Vitals:   04/20/22 1217  BP: (!) 130/52  Pulse: 88  Resp: 19  Temp: 97.6 F (36.4 C)  SpO2: 93%  Weight: 134 lb (60.8 kg)  Height: '5\' 2"'$  (1.575 m)   Body mass index is 24.51 kg/m. Physical Exam Nursing note reviewed.  Constitutional:      Appearance: Normal appearance.  HENT:     Head: Normocephalic and atraumatic.     Nose: Nose normal.     Mouth/Throat:     Mouth: Mucous membranes are moist.  Eyes:     Extraocular Movements: Extraocular movements intact.     Conjunctiva/sclera: Conjunctivae normal.     Pupils: Pupils are equal, round, and reactive to light.     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   Cardiovascular:     Rate and Rhythm: Normal rate and regular rhythm.     Heart sounds: No murmur heard. Pulmonary:     Effort: Pulmonary effort is normal.     Breath sounds: No wheezing, rhonchi or rales.  Abdominal:     General: Bowel sounds are normal.     Palpations: Abdomen is soft.     Tenderness: There is no abdominal tenderness. There is no right CVA tenderness, guarding or rebound.  Musculoskeletal:        General: Tenderness present.     Cervical back: Normal range of motion and neck supple.     Right lower leg: No edema.     Left lower leg: No edema.     Comments: No spinal spinous processes tenderness palpated.   Skin:    General: Skin is warm and dry.     Findings: No bruising.  Neurological:     General: No  focal deficit present.     Mental Status: She is alert. Mental status is at baseline.     Motor: No weakness.     Coordination: Coordination normal.     Gait: Gait abnormal.     Comments: Oriented to person  Psychiatric:        Mood and Affect: Mood normal.    Labs reviewed: Recent Labs    10/15/21 0640 10/16/21 0328 10/23/21 0000  12/10/21 1601 12/10/21 1823 12/29/21 0000 02/02/22 0000 03/05/22 0000 03/17/22 0000  NA 133* 137   < > 137 134*   < > 140 133* 138  K 3.1* 4.0   < > 5.3* 5.1   < > 3.6 5.0 4.9  CL 101 111   < > 100 101   < > 101 97* 98*  CO2 23 21*   < > 27 27   < > 30* 23* 32*  GLUCOSE 118* 105*  --  99 112*  --   --   --   --   BUN 11 15   < > 38* 35*   < > 40* 40* 28*  CREATININE 0.60 0.61   < > 0.95 0.91   < > 1.2* 1.1 0.9  CALCIUM 8.7* 8.4*   < > 9.4 9.0   < > 9.9 9.5 10.0  MG 1.7  --   --   --   --   --   --   --   --   PHOS 2.7  --   --   --   --   --   --   --   --    < > = values in this interval not displayed.   Recent Labs    10/11/21 0325 10/12/21 0322 10/23/21 0000 12/10/21 1601 12/29/21 0000 03/05/22 0000  AST 49* 39   < > '15 18 29  '$ ALT 25 24   < > '11 12 25  '$ ALKPHOS 41 34*   < > 42 56 61  BILITOT 0.6 0.4  --  0.4  --   --   PROT 5.8* 5.1*  --  6.8  --   --   ALBUMIN 3.1* 2.7*   < > 2.9* 3.7 4.0   < > = values in this interval not displayed.   Recent Labs    10/13/21 0704 10/16/21 0328 10/23/21 0000 12/10/21 1601 12/29/21 0000 03/05/22 0000 03/17/22 0000  WBC 9.4 9.2   < > 11.1* 8.0 7.4 11.5  NEUTROABS  --   --    < > 7.3 3,584.00 4,544.00  --   HGB 11.6* 12.0   < > 10.4* 12.1 13.7 13.7  HCT 35.5* 36.7   < > 32.2* 37 41 42  MCV 96.7 94.8  --  98.2  --   --   --   PLT 282 365   < > 363 335 289 432*   < > = values in this interval not displayed.   No results found for: "TSH" Lab Results  Component Value Date   HGBA1C 7.4 03/17/2022   No results found for: "CHOL", "HDL", "LDLCALC", "LDLDIRECT", "TRIG", "CHOLHDL"  Significant Diagnostic Results in last 30 days:  No results found.  Assessment/Plan Essential hypertension Dbp in 50s, loose control Bp in setting of frequent falling, decrease Losartan'25mg'$ /'50mg'$ , Bp daily, Bun/creat 28/0.9 03/17/22  Diabetes mellitus type 2, diet-controlled ([place]) on diet, Hgb a1c 6.8 10/11/21  Osteoporosis  takes Prolia, Ca, Vit  D  Anxiety due  to dementia [facility])  taking Quetiapine, Mirtazapine, Depakote, positive responses, less anxiety, irritation, and restlessness .  Hyponatremia  Na 138 03/17/22  Chronic a-fib ([facility]) heart rate is in control, takes ASA '81mg'$  qd, Hgb 13.7 03/17/22  Vascular dementia ([facility]) intermittent agitation, improving. 03/03/22 CT head: Generalized cerebral atrophy, chronic small vessel ischemic disease.    Family/ staff Communication: plan of care reviewed with the patient and charge nurse.   Labs/tests ordered:  none  \ Time spend 35 minutes.

## 2022-04-20 NOTE — Assessment & Plan Note (Signed)
heart rate is in control, takes ASA 81mg qd, Hgb 13.7 03/17/22 

## 2022-04-20 NOTE — Assessment & Plan Note (Signed)
takes Prolia, Ca, Vit D 

## 2022-04-20 NOTE — Assessment & Plan Note (Signed)
Dbp in 50s, loose control Bp in setting of frequent falling, decrease Losartan'25mg'$ /'50mg'$ , Bp daily, Bun/creat 28/0.9 03/17/22

## 2022-04-20 NOTE — Assessment & Plan Note (Addendum)
intermittent agitation, improving.  03/03/22 CT head: Generalized cerebral atrophy, chronic small vessel ischemic disease. 

## 2022-04-23 ENCOUNTER — Encounter: Payer: Self-pay | Admitting: Nurse Practitioner

## 2022-04-27 ENCOUNTER — Encounter: Payer: Self-pay | Admitting: Nurse Practitioner

## 2022-04-27 ENCOUNTER — Non-Acute Institutional Stay (SKILLED_NURSING_FACILITY): Payer: Medicare Other | Admitting: Nurse Practitioner

## 2022-04-27 DIAGNOSIS — E871 Hypo-osmolality and hyponatremia: Secondary | ICD-10-CM

## 2022-04-27 DIAGNOSIS — H0100A Unspecified blepharitis right eye, upper and lower eyelids: Secondary | ICD-10-CM

## 2022-04-27 DIAGNOSIS — E119 Type 2 diabetes mellitus without complications: Secondary | ICD-10-CM

## 2022-04-27 DIAGNOSIS — H0100B Unspecified blepharitis left eye, upper and lower eyelids: Secondary | ICD-10-CM

## 2022-04-27 DIAGNOSIS — M810 Age-related osteoporosis without current pathological fracture: Secondary | ICD-10-CM

## 2022-04-27 DIAGNOSIS — F0394 Unspecified dementia, unspecified severity, with anxiety: Secondary | ICD-10-CM | POA: Diagnosis not present

## 2022-04-27 DIAGNOSIS — R531 Weakness: Secondary | ICD-10-CM

## 2022-04-27 DIAGNOSIS — R1310 Dysphagia, unspecified: Secondary | ICD-10-CM | POA: Diagnosis not present

## 2022-04-27 DIAGNOSIS — I10 Essential (primary) hypertension: Secondary | ICD-10-CM

## 2022-04-27 DIAGNOSIS — I482 Chronic atrial fibrillation, unspecified: Secondary | ICD-10-CM | POA: Diagnosis not present

## 2022-04-27 DIAGNOSIS — F01C11 Vascular dementia, severe, with agitation: Secondary | ICD-10-CM | POA: Diagnosis not present

## 2022-04-27 DIAGNOSIS — H01003 Unspecified blepharitis right eye, unspecified eyelid: Secondary | ICD-10-CM | POA: Insufficient documentation

## 2022-04-27 NOTE — Assessment & Plan Note (Signed)
taking Quetiapine, Mirtazapine, Depakote, positive responses,  less anxiety, irritation, and restlessness .

## 2022-04-27 NOTE — Assessment & Plan Note (Signed)
shaky, confusion, generalized malaise. Afebrile, no O2 desaturation, denied abd pain, nausea, vomiting, or constipation. Will obtain CBC/diff, CMP/eGFR, UA C/S in am.

## 2022-04-27 NOTE — Progress Notes (Signed)
Location:   SNF [place] Room Number: NO/35/A Place of Service:  SNF (31) Provider: [facility] Heiley Shaikh NP  Virgie Dad, MD  Patient Care Team: Virgie Dad, MD as PCP - General (Internal Medicine)  Extended Emergency Contact Information Primary Emergency Contact: [place] Mobile Phone: [phone] Relation: Grandson Interpreter needed? No Secondary Emergency Contact: [place] Mobile Phone: [phone] Relation: Granddaughter  Code Status: DNR Goals of care: Advanced Directive information    04/20/2022   12:30 PM  Advanced Directives  Does Patient Have a Medical Advance Directive? Yes  Type of Paramedic of [place];Living will;Out of facility DNR (pink MOST or yellow form)  Does patient want to make changes to medical advance directive? No - Patient declined  Copy of [place] in Chart? Yes - validated most recent copy scanned in chart (See row information)     Chief Complaint  Patient presents with   Acute Visit    Patient is being seen for shakiness and confusion    HPI:  Pt is a 88 y.o. female seen today for an acute visit for shaky, confusion, generalized malaise. Afebrile, no O2 desaturation, denied abd pain, nausea, vomiting, or constipation.    Dysphagia III diet             Dementia, intermittent agitation, improving.  03/03/22 CT head: Generalized cerebral atrophy, chronic small vessel ischemic disease.              Chronic Afib, heart rate is in control, takes ASA 63m qd, Hgb 13.7 03/17/22             HTN, takes Losartan,  Bun/creat 28/0.9 03/17/22             T2DM, on diet, Hgb a1c 7.4 03/17/22             OP, takes Prolia, Ca, Vit D             Depression/Anxiety, taking Quetiapine, Mirtazapine, Depakote, positive responses,  less anxiety, irritation, and restlessness .             Hyponatremia, Na 138 03/17/22  Past Medical History:  Diagnosis Date   Anxiety    Arthritis    Cancer ([place]     hx of skin cancer removal on hand    Colitis    Diabetes mellitus without complication ([facility]    Gallstones    Hyperlipidemia    Hypertension    IBS (irritable bowel syndrome)    Osteoporosis    Past Surgical History:  Procedure Laterality Date   APPENDECTOMY     BACK SURGERY     x2   cataract surgery      bilateral    [place]    for glaucoma    left eye peeling surgery      left femur bone surgery      1978   REVERSE SHOULDER ARTHROPLASTY Right 10/10/2014   Procedure: RIGHT REVERSE SHOULDER ARTHROPLASTY;  Surgeon: KJustice Britain MD;  [facility]  Service: Orthopedics;  Laterality: Right;   ROTATOR CUFF REPAIR     TOTAL HIP ARTHROPLASTY Left 02/20/2014   Procedure: LEFT TOTAL HIP ARTHROPLASTY ANTERIOR APPROACH;  Surgeon: FGearlean Alf MD;  Location: [facility];  Service: Orthopedics;  Laterality: Left;   WRIST SURGERY     left     Allergies  Allergen Reactions   Robaxin [Methocarbamol] Rash and Other (See Comments)  Red rash on right side of face and forehead cleared after benadryl given, per [name], RN   Cefazolin Diarrhea   Dilaudid [Hydromorphone] Other (See Comments)    Stomach upset   Fosamax [Alendronate] Other (See Comments)    Upset stomach   Hyoscyamine Other (See Comments)    "Caused gas"   Morphine And Related Itching   Nsaids Diarrhea   Parathyroid Hormone (Recomb) Other (See Comments)    was not able to give herself the shot   Penicillins Itching    Tolerated Unasyn Has patient had a PCN reaction causing immediate rash, facial/tongue/throat swelling, SOB or lightheadedness with hypotension: Yes Has patient had a PCN reaction causing severe rash involving mucus membranes or skin necrosis: No Has patient had a PCN reaction that required hospitalization: Was in hospital Has patient had a PCN reaction occurring within the last 10 years: No If all of the above answers are "NO", then may proceed with Cephalosporin use.     Tape Other (See Comments)    Bandages and leads cause bruising and weeping at sites   Flurbiprofen Diarrhea    Allergies as of 04/27/2022       Reactions   Robaxin [methocarbamol] Rash, Other (See Comments)   Red rash on right side of face and forehead cleared after benadryl given, per [name], RN   Cefazolin Diarrhea   Dilaudid [hydromorphone] Other (See Comments)   Stomach upset   Fosamax [alendronate] Other (See Comments)   Upset stomach   Hyoscyamine Other (See Comments)   "Caused gas"   Morphine And Related Itching   Nsaids Diarrhea   Parathyroid Hormone (recomb) Other (See Comments)   was not able to give herself the shot   Penicillins Itching   Tolerated Unasyn Has patient had a PCN reaction causing immediate rash, facial/tongue/throat swelling, SOB or lightheadedness with hypotension: Yes Has patient had a PCN reaction causing severe rash involving mucus membranes or skin necrosis: No Has patient had a PCN reaction that required hospitalization: Was in hospital Has patient had a PCN reaction occurring within the last 10 years: No If all of the above answers are "NO", then may proceed with Cephalosporin use.   Tape Other (See Comments)   Bandages and leads cause bruising and weeping at sites   Flurbiprofen Diarrhea        Medication List        Accurate as of April 27, 2022  4:52 PM. If you have any questions, ask your nurse or doctor.          acetaminophen 500 MG tablet Commonly known as: TYLENOL Take 2 tablets (1,000 mg total) by mouth 3 (three) times daily as needed.   aspirin EC 81 MG tablet Take 81 mg by mouth daily.   calcium carbonate 1500 (600 Ca) MG Tabs tablet Commonly known as: OSCAL Take 600 mg of elemental calcium by mouth daily.   Cholecalciferol 25 MCG (1000 UT) tablet Take 1,000 Units by mouth daily.   denosumab 60 MG/ML Sosy injection Commonly known as: PROLIA Inject 60 mg into the skin every 6 (six) months. Last injection  06/02/21   divalproex 125 MG capsule Commonly known as: DEPAKOTE SPRINKLE Take 125 mg by mouth 2 (two) times daily.   DSS 100 MG Caps Take 100 mg by mouth 2 (two) times daily.   gabapentin 100 MG capsule Commonly known as: NEURONTIN Take 200 mg by mouth 2 (two) times daily.   lactose free nutrition Liqd Take 237 mLs by mouth  2 (two) times daily between meals.   loratadine 10 MG tablet Commonly known as: CLARITIN Take 10 mg by mouth daily as needed for allergies.   LORazepam 0.5 MG tablet Commonly known as: ATIVAN Take 0.5 mg by mouth every 6 (six) hours as needed for anxiety.   losartan 50 MG tablet Commonly known as: COZAAR Take 1 tablet (50 mg total) by mouth daily.   MELATONIN PO Take 3 mg by mouth at bedtime as needed (sleep).   mirtazapine 15 MG tablet Commonly known as: REMERON Take 15 mg by mouth at bedtime.   PreserVision AREDS 2 Caps Take 1 capsule by mouth daily.   QUEtiapine 25 MG tablet Commonly known as: SEROQUEL Take 12.5 mg by mouth 2 (two) times daily.   traMADol 50 MG tablet Commonly known as: ULTRAM Take 0.5 tablets (25 mg total) by mouth every 8 (eight) hours as needed.        Review of Systems  Constitutional:  Positive for appetite change and fatigue. Negative for fever.       Shaky, malaise  HENT:  Positive for hearing loss and trouble swallowing. Negative for congestion.   Eyes:  Positive for visual disturbance.       Blind right eye.   Respiratory:  Negative for cough and shortness of breath.   Cardiovascular:  Positive for leg swelling.  Gastrointestinal:  Negative for abdominal pain and constipation.  Genitourinary:  Positive for frequency. Negative for dysuria, hematuria and urgency.  Musculoskeletal:  Positive for arthralgias, back pain and gait problem.       C/o lower back pain  Skin:  Negative for color change.  Neurological:  Negative for speech difficulty, weakness and headaches.       Memory difficulty.    Psychiatric/Behavioral:  Positive for behavioral problems and confusion. Negative for sleep disturbance. The patient is not nervous/anxious.     Immunization History  Administered Date(s) Administered   Influenza Split 07/04/2009, 04/09/2010, 05/06/2011, 03/29/2012, 05/03/2013   Influenza, High Dose Seasonal PF 05/02/2013, 05/25/2017, 04/26/2018, 04/24/2019   Influenza, Quadrivalent, Recombinant, Inj, Pf 04/26/2020   Influenza,inj,Quad PF,6+ Mos 05/26/2021   Influenza,inj,quad, With Preservative 04/18/2017   Influenza-Unspecified 05/12/2015   PFIZER(Purple Top)SARS-COV-2 Vaccination 08/25/2019, 09/19/2019   Pfizer Covid-19 Vaccine Bivalent Booster 83yr & up 05/01/2020, 01/20/2021, 10/27/2021   Pneumococcal Conjugate-13 01/29/2014   Pneumococcal Polysaccharide-23 06/07/2007   Td 05/02/2003   Tdap 10/18/2011   Zoster, Live 01/17/2006   Pertinent  Health Maintenance Due  Topic Date Due   FOOT EXAM  Never done   OPHTHALMOLOGY EXAM  Never done   DEXA SCAN  Never done   INFLUENZA VACCINE  02/16/2022   HEMOGLOBIN A1C  09/16/2022      10/16/2021   12:00 AM 10/16/2021    9:00 AM 12/10/2021    4:03 PM 03/02/2022   11:50 PM 04/20/2022   12:30 PM  Fall Risk  Falls in the past year?     0  Patient Fall Risk Level High fall risk High fall risk High fall risk High fall risk Moderate fall risk  Patient at Risk for Falls Due to     History of fall(s)  Fall risk Follow up     Falls evaluation completed   Functional Status Survey:    Vitals:   04/27/22 1642  BP: (!) 130/52  Pulse: 88  Resp: 19  Temp: 97.6 F (36.4 C)  SpO2: 93%  Weight: 134 lb (60.8 kg)  Height: _0  (1.575 m)  Body mass index is 24.51 kg/m. Physical Exam Nursing note reviewed.  Constitutional:      Appearance: Normal appearance.  HENT:     Head: Normocephalic and atraumatic.     Nose: Nose normal.     Mouth/Throat:     Mouth: Mucous membranes are moist.  Eyes:     Extraocular Movements: Extraocular  movements intact.     Conjunctiva/sclera: Conjunctivae normal.     Pupils: Pupils are equal, round, and reactive to light.     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 Crusted eyelashes.   Cardiovascular:     Rate and Rhythm: Normal rate and regular rhythm.     Heart sounds: No murmur heard. Pulmonary:     Effort: Pulmonary effort is normal.     Breath sounds: No wheezing, rhonchi or rales.  Abdominal:     General: Bowel sounds are normal.     Palpations: Abdomen is soft.     Tenderness: There is no abdominal tenderness. There is no right CVA tenderness, guarding or rebound.  Musculoskeletal:     Cervical back: Normal range of motion and neck supple.     Right lower leg: Edema present.     Left lower leg: Edema present.     Comments: No spinal spinous processes tenderness palpated. Trace edema BLE  Skin:    General: Skin is warm and dry.     Findings: Bruising present.     Comments: Left arm from fall  Neurological:     General: No focal deficit present.     Mental Status: She is alert. Mental status is at baseline.     Motor: No weakness.     Coordination: Coordination normal.     Gait: Gait abnormal.     Comments: Oriented to person  Psychiatric:        Mood and Affect: Mood normal.     Labs reviewed: Recent Labs    10/15/21 0640 10/16/21 0328 10/23/21 0000 12/10/21 1601 12/10/21 1823 12/29/21 0000 02/02/22 0000 03/05/22 0000 03/17/22 0000  NA 133* 137   < > 137 134*   < > 140 133* 138  K 3.1* 4.0   < > 5.3* 5.1   < > 3.6 5.0 4.9  CL 101 111   < > 100 101   < > 101 97* 98*  CO2 23 21*   < > 27 27   < > 30* 23* 32*  GLUCOSE 118* 105*  --  99 112*  --   --   --   --   BUN 11 15   < > 38* 35*   < > 40* 40* 28*  CREATININE 0.60 0.61   < > 0.95 0.91   < > 1.2* 1.1 0.9  CALCIUM 8.7* 8.4*   < > 9.4 9.0   < > 9.9 9.5 10.0  MG 1.7  --   --   --   --   --   --   --   --   PHOS 2.7  --   --   --   --   --   --   --   --    < > = values in this interval not  displayed.   Recent Labs    10/11/21 0325 10/12/21 0322 10/23/21 0000 12/10/21 1601 12/29/21 0000 03/05/22 0000  AST 49* 39   < > _0 ALT 25 24   < > 11 12 25  ALKPHOS 41 34*   < > 42 56 61  BILITOT 0.6 0.4  --  0.4  --   --   PROT 5.8* 5.1*  --  6.8  --   --   ALBUMIN 3.1* 2.7*   < > 2.9* 3.7 4.0   < > = values in this interval not displayed.   Recent Labs    10/13/21 0704 10/16/21 0328 10/23/21 0000 12/10/21 1601 12/29/21 0000 03/05/22 0000 03/17/22 0000  WBC 9.4 9.2   < > 11.1* 8.0 7.4 11.5  NEUTROABS  --   --    < > 7.3 3,584.00 4,544.00  --   HGB 11.6* 12.0   < > 10.4* 12.1 13.7 13.7  HCT 35.5* 36.7   < > 32.2* 37 41 42  MCV 96.7 94.8  --  98.2  --   --   --   PLT 282 365   < > 363 335 289 432*   < > = values in this interval not displayed.   No results found for: "TSH" Lab Results  Component Value Date   HGBA1C 7.4 03/17/2022   No results found for: "CHOL", "HDL", "LDLCALC", "LDLDIRECT", "TRIG", "CHOLHDL"  Significant Diagnostic Results in last 30 days:  No results found.  Assessment/Plan: Generalized weakness  shaky, confusion, generalized malaise. Afebrile, no O2 desaturation, denied abd pain, nausea, vomiting, or constipation. Will obtain CBC/diff, CMP/eGFR, UA C/S in am.   Diabetes mellitus type 2, diet-controlled ([place]) on diet, Hgb a1c 7.4 03/17/22, will obtain fasting CBG ab breakfast, re-eval in a week.   Vascular dementia ([place]) intermittent agitation, improving.  03/03/22 CT head: Generalized cerebral atrophy, chronic small vessel ischemic disease.  Dysphagia Dysphagia III diet  Chronic a-fib ([facility]) heart rate is in control, takes ASA 34m qd, Hgb 13.7 03/17/22  Essential hypertension Blood pressure is controlled,  takes Losartan,  Bun/creat 28/0.9 03/17/22  Osteoporosis  takes Prolia, Ca, Vit D  Anxiety due to dementia ([place]  taking Quetiapine, Mirtazapine, Depakote, positive responses,  less anxiety, irritation, and restlessness  .  Hyponatremia Na 138 03/17/22  Blepharitis of both eyes Apply 0.5% Erythromycin ophthalmic oint 1/2 inch ribbon R+L subconjunctival sac qhs x 4 weeks.     Family/ staff Communication: plan of care reviewed with the patient and charge nurse.   Labs/tests ordered:  CBC/diff, CMP/eGFR, UA C/S Time spend 35 minutes.

## 2022-04-27 NOTE — Assessment & Plan Note (Signed)
intermittent agitation, improving.  03/03/22 CT head: Generalized cerebral atrophy, chronic small vessel ischemic disease. 

## 2022-04-27 NOTE — Assessment & Plan Note (Signed)
on diet, Hgb a1c 7.4 03/17/22, will obtain fasting CBG ab breakfast, re-eval in a week.

## 2022-04-27 NOTE — Assessment & Plan Note (Signed)
heart rate is in control, takes ASA 81mg qd, Hgb 13.7 03/17/22 

## 2022-04-27 NOTE — Assessment & Plan Note (Signed)
Dysphagia III diet.  

## 2022-04-27 NOTE — Assessment & Plan Note (Signed)
Apply 0.5% Erythromycin ophthalmic oint 1/2 inch ribbon R+L subconjunctival sac qhs x 4 weeks.

## 2022-04-27 NOTE — Assessment & Plan Note (Signed)
takes Prolia, Ca, Vit D 

## 2022-04-27 NOTE — Assessment & Plan Note (Signed)
Na 138 03/17/22 

## 2022-04-27 NOTE — Assessment & Plan Note (Addendum)
Blood pressure is controlled,  takes Losartan,  Bun/creat 28/0.9 03/17/22 

## 2022-04-28 DIAGNOSIS — I10 Essential (primary) hypertension: Secondary | ICD-10-CM | POA: Diagnosis not present

## 2022-04-28 DIAGNOSIS — N390 Urinary tract infection, site not specified: Secondary | ICD-10-CM | POA: Diagnosis not present

## 2022-04-28 DIAGNOSIS — E119 Type 2 diabetes mellitus without complications: Secondary | ICD-10-CM | POA: Diagnosis not present

## 2022-04-28 DIAGNOSIS — D508 Other iron deficiency anemias: Secondary | ICD-10-CM | POA: Diagnosis not present

## 2022-05-03 ENCOUNTER — Encounter: Payer: Self-pay | Admitting: Nurse Practitioner

## 2022-05-03 NOTE — Progress Notes (Signed)
This encounter was created in error - please disregard.

## 2022-05-06 ENCOUNTER — Other Ambulatory Visit: Payer: Self-pay | Admitting: Nurse Practitioner

## 2022-05-06 NOTE — Progress Notes (Signed)
HPOA requested.

## 2022-05-10 DIAGNOSIS — F01B2 Vascular dementia, moderate, with psychotic disturbance: Secondary | ICD-10-CM | POA: Diagnosis not present

## 2022-05-10 DIAGNOSIS — F331 Major depressive disorder, recurrent, moderate: Secondary | ICD-10-CM | POA: Diagnosis not present

## 2022-05-10 DIAGNOSIS — F411 Generalized anxiety disorder: Secondary | ICD-10-CM | POA: Diagnosis not present

## 2022-05-20 DIAGNOSIS — Z23 Encounter for immunization: Secondary | ICD-10-CM | POA: Diagnosis not present

## 2022-05-24 ENCOUNTER — Encounter: Payer: Self-pay | Admitting: Nurse Practitioner

## 2022-05-24 ENCOUNTER — Other Ambulatory Visit: Payer: Self-pay

## 2022-05-24 ENCOUNTER — Emergency Department (HOSPITAL_COMMUNITY)
Admission: EM | Admit: 2022-05-24 | Discharge: 2022-05-24 | Disposition: A | Discharge status: Home / Self Care | Payer: Medicare Other | Attending: Emergency Medicine | Admitting: Emergency Medicine

## 2022-05-24 ENCOUNTER — Non-Acute Institutional Stay (SKILLED_NURSING_FACILITY): Payer: Medicare Other | Admitting: Nurse Practitioner

## 2022-05-24 DIAGNOSIS — M810 Age-related osteoporosis without current pathological fracture: Secondary | ICD-10-CM

## 2022-05-24 DIAGNOSIS — I482 Chronic atrial fibrillation, unspecified: Secondary | ICD-10-CM

## 2022-05-24 DIAGNOSIS — R1310 Dysphagia, unspecified: Secondary | ICD-10-CM

## 2022-05-24 DIAGNOSIS — Z7401 Bed confinement status: Secondary | ICD-10-CM | POA: Diagnosis not present

## 2022-05-24 DIAGNOSIS — E871 Hypo-osmolality and hyponatremia: Secondary | ICD-10-CM | POA: Diagnosis not present

## 2022-05-24 DIAGNOSIS — E119 Type 2 diabetes mellitus without complications: Secondary | ICD-10-CM | POA: Diagnosis not present

## 2022-05-24 DIAGNOSIS — I959 Hypotension, unspecified: Secondary | ICD-10-CM | POA: Diagnosis not present

## 2022-05-24 DIAGNOSIS — I10 Essential (primary) hypertension: Secondary | ICD-10-CM

## 2022-05-24 DIAGNOSIS — F01C11 Vascular dementia, severe, with agitation: Secondary | ICD-10-CM

## 2022-05-24 DIAGNOSIS — F0390 Unspecified dementia without behavioral disturbance: Secondary | ICD-10-CM | POA: Diagnosis not present

## 2022-05-24 DIAGNOSIS — D72829 Elevated white blood cell count, unspecified: Secondary | ICD-10-CM | POA: Diagnosis not present

## 2022-05-24 DIAGNOSIS — R531 Weakness: Secondary | ICD-10-CM | POA: Diagnosis not present

## 2022-05-24 DIAGNOSIS — R4182 Altered mental status, unspecified: Secondary | ICD-10-CM | POA: Diagnosis not present

## 2022-05-24 DIAGNOSIS — E860 Dehydration: Secondary | ICD-10-CM | POA: Diagnosis not present

## 2022-05-24 DIAGNOSIS — Z7982 Long term (current) use of aspirin: Secondary | ICD-10-CM | POA: Insufficient documentation

## 2022-05-24 DIAGNOSIS — R627 Adult failure to thrive: Secondary | ICD-10-CM | POA: Insufficient documentation

## 2022-05-24 DIAGNOSIS — F0394 Unspecified dementia, unspecified severity, with anxiety: Secondary | ICD-10-CM | POA: Diagnosis not present

## 2022-05-24 LAB — COMPREHENSIVE METABOLIC PANEL
ALT: 21 U/L (ref 0–44)
AST: 34 U/L (ref 15–41)
Albumin: 3.6 g/dL (ref 3.5–5.0)
Alkaline Phosphatase: 88 U/L (ref 38–126)
Anion gap: 10 (ref 5–15)
BUN: 36 mg/dL — ABNORMAL HIGH (ref 8–23)
CO2: 23 mmol/L (ref 22–32)
Calcium: 9.1 mg/dL (ref 8.9–10.3)
Chloride: 105 mmol/L (ref 98–111)
Creatinine, Ser: 1.09 mg/dL — ABNORMAL HIGH (ref 0.44–1.00)
GFR, Estimated: 47 mL/min — ABNORMAL LOW (ref >60)
Glucose, Bld: 150 mg/dL — ABNORMAL HIGH (ref 70–99)
Potassium: 4.1 mmol/L (ref 3.5–5.1)
Sodium: 138 mmol/L (ref 135–145)
Total Bilirubin: 0.7 mg/dL (ref 0.3–1.2)
Total Protein: 7.6 g/dL (ref 6.5–8.1)

## 2022-05-24 LAB — CBC WITH DIFFERENTIAL/PLATELET
Abs Immature Granulocytes: 0.04 10*3/uL (ref 0.00–0.07)
Basophils Absolute: 0 10*3/uL (ref 0.0–0.1)
Basophils Relative: 0 %
Eosinophils Absolute: 0.1 10*3/uL (ref 0.0–0.5)
Eosinophils Relative: 1 %
HCT: 42 % (ref 36.0–46.0)
Hemoglobin: 13.4 g/dL (ref 12.0–15.0)
Immature Granulocytes: 0 %
Lymphocytes Relative: 19 %
Lymphs Abs: 2.2 10*3/uL (ref 0.7–4.0)
MCH: 30.8 pg (ref 26.0–34.0)
MCHC: 31.9 g/dL (ref 30.0–36.0)
MCV: 96.6 fL (ref 80.0–100.0)
Monocytes Absolute: 1.7 10*3/uL — ABNORMAL HIGH (ref 0.1–1.0)
Monocytes Relative: 15 %
Neutro Abs: 7.7 10*3/uL (ref 1.7–7.7)
Neutrophils Relative %: 65 %
Platelets: 343 10*3/uL (ref 150–400)
RBC: 4.35 MIL/uL (ref 3.87–5.11)
RDW: 14.1 % (ref 11.5–15.5)
WBC: 11.8 10*3/uL — ABNORMAL HIGH (ref 4.0–10.5)
nRBC: 0 % (ref 0.0–0.2)

## 2022-05-24 LAB — URINALYSIS, ROUTINE W REFLEX MICROSCOPIC
Bacteria, UA: NONE SEEN
Bilirubin Urine: NEGATIVE
Glucose, UA: 150 mg/dL — AB
Hgb urine dipstick: NEGATIVE
Ketones, ur: 5 mg/dL — AB
Nitrite: NEGATIVE
Protein, ur: 30 mg/dL — AB
Specific Gravity, Urine: 1.019 (ref 1.005–1.030)
pH: 5 (ref 5.0–8.0)

## 2022-05-24 MED ORDER — SODIUM CHLORIDE 0.9 % IV BOLUS
1000.0000 mL | Freq: Once | INTRAVENOUS | Status: AC
  Administered 2022-05-24: 1000 mL via INTRAVENOUS

## 2022-05-24 NOTE — Assessment & Plan Note (Signed)
ncreased confusion, lethargy, found sitting on floor back her wheelchair. Afebrile, denied abd pain, poor historian, not sure if dysuria, or chest pain present. Also the patient was noted O2 desaturation. CBC/diff, CMP/eGFR, UA C/S, CXR Decrease Seroquel to 12.98m qd/bid, O2 2lpm via [place] to maintain Sat O2>90%

## 2022-05-24 NOTE — Discharge Instructions (Signed)
Plenty of fluids and follow up with your family doctor next week for recheck

## 2022-05-24 NOTE — Assessment & Plan Note (Signed)
heart rate is in control, takes ASA '81mg'$  qd, Hgb 13.7 03/17/22

## 2022-05-24 NOTE — Assessment & Plan Note (Signed)
Blood pressure is controlled,  takes Losartan,  Bun/creat 28/0.9 03/17/22

## 2022-05-24 NOTE — Assessment & Plan Note (Signed)
Na 138 03/17/22

## 2022-05-24 NOTE — Assessment & Plan Note (Signed)
Lethargy, increased confusion, HPOA desires ED for further evaluation and treatment.

## 2022-05-24 NOTE — ED Triage Notes (Signed)
Patient brought in by EMS from [facility]. Facility sent patient out for further evaluation after patient refused to eat or drink for the last 48hrs. Per EMS facility states this is not patient norm. HX of dementia and patient is HOH.   CBG:197 BP: 128/60 O2- 97% RA P: 80 R:18

## 2022-05-24 NOTE — Assessment & Plan Note (Signed)
intermittent agitation, improving.  03/03/22 CT head: Generalized cerebral atrophy, chronic small vessel ischemic disease.

## 2022-05-24 NOTE — Assessment & Plan Note (Signed)
Lethargic today, will decrease Quetiapine 12.'5mg'$  qd/bid, continue Mirtazapine, Depakote, prn Lorazepam, positive responses,  less anxiety, irritation, and restlessness .

## 2022-05-24 NOTE — Assessment & Plan Note (Signed)
on diet, Hgb a1c 7.4 03/17/22

## 2022-05-24 NOTE — ED Provider Notes (Signed)
[facility] DEPT Provider Note   CSN: [id number] Arrival date & time: 05/24/22  1611     History {Add pertinent medical, surgical, social history, OB history to HPI:1} Chief Complaint  Patient presents with   Failure To Thrive    Has not eat or dink in [place] is a 88 y.o. female.  Patient has a history of dementia.  She is a DNR/DNI.  She has not been drinking or eating much lately.   Weakness      Home Medications Prior to Admission medications   Medication Sig Start Date End Date Taking? Authorizing Provider  acetaminophen (TYLENOL) 500 MG tablet Take 2 tablets (1,000 mg total) by mouth 3 (three) times daily as needed. 12/11/21   [place], Amy E, NP  aspirin EC 81 MG tablet Take 81 mg by mouth daily.    [provider]  calcium carbonate (OSCAL) 1500 (600 Ca) MG TABS tablet Take 600 mg of elemental calcium by mouth daily.    [provider]  Cholecalciferol 25 MCG (1000 UT) tablet Take 1,000 Units by mouth daily.    [provider]  divalproex (DEPAKOTE SPRINKLE) 125 MG capsule Take 125 mg by mouth 2 (two) times daily.    [provider]  docusate sodium 100 MG CAPS Take 100 mg by mouth 2 (two) times daily. 02/25/14   Perkins, Alexzandrew L, PA-C  gabapentin (NEURONTIN) 100 MG capsule Take 200 mg by mouth 2 (two) times daily.    [provider]  lactose free nutrition (BOOST) LIQD Take 237 mLs by mouth 2 (two) times daily between meals.    [provider]  loratadine (CLARITIN) 10 MG tablet Take 10 mg by mouth daily as needed for allergies.    [provider]  LORazepam (ATIVAN) 0.5 MG tablet Take 0.5 mg by mouth every 6 (six) hours as needed for anxiety.    [provider]  losartan (COZAAR) 50 MG tablet Take 1 tablet (50 mg total) by mouth daily. 10/17/21   Shawna Clamp, MD  MELATONIN PO Take 3 mg by mouth at bedtime as needed (sleep).    [provider]   mirtazapine (REMERON) 15 MG tablet Take 15 mg by mouth at bedtime.    [provider]  Multiple Vitamins-Minerals (PRESERVISION AREDS 2) CAPS Take 1 capsule by mouth daily.    [provider]  QUEtiapine (SEROQUEL) 25 MG tablet Take 12.5 mg by mouth 2 (two) times daily.    [provider]  traMADol (ULTRAM) 50 MG tablet Take 0.5 tablets (25 mg total) by mouth every 8 (eight) hours as needed. 02/05/22   Virgie Dad, MD      Allergies    Robaxin [methocarbamol], Cefazolin, Dilaudid [hydromorphone], Fosamax [alendronate], Hyoscyamine, Morphine and related, Nsaids, Parathyroid hormone (recomb), Penicillins, Tape, and Flurbiprofen    Review of Systems   Review of Systems  Neurological:  Positive for weakness.    Physical Exam Updated Vital Signs BP (!) 147/96   Pulse 95   Temp 98.7 F (37.1 C) (Oral)   Resp (!) 27   SpO2 95%  Physical Exam  ED Results / Procedures / Treatments   Labs (all labs ordered are listed, but only abnormal results are displayed) Labs Reviewed  CBC WITH DIFFERENTIAL/PLATELET - Abnormal; Notable for the following components:      Result Value   WBC 11.8 (*)    Monocytes Absolute 1.7 (*)    All  other components within normal limits  COMPREHENSIVE METABOLIC PANEL - Abnormal; Notable for the following components:   Glucose, Bld 150 (*)    BUN 36 (*)    Creatinine, Ser 1.09 (*)    GFR, Estimated 47 (*)    All other components within normal limits  URINALYSIS, ROUTINE W REFLEX MICROSCOPIC - Abnormal; Notable for the following components:   Glucose, UA 150 (*)    Ketones, ur 5 (*)    Protein, ur 30 (*)    Leukocytes,Ua TRACE (*)    All other components within normal limits  URINE CULTURE    EKG None  Radiology No results found.  Procedures Procedures  {Document cardiac monitor, telemetry assessment procedure when appropriate:1}  Medications Ordered in ED Medications  sodium chloride 0.9 % bolus 1,000 mL (0 mLs  Intravenous Stopped 05/24/22 1852)  sodium chloride 0.9 % bolus 1,000 mL (0 mLs Intravenous Stopped 05/24/22 2026)    ED Course/ Medical Decision Making/ A&P                           Medical Decision Making Amount and/or Complexity of Data Reviewed Labs: ordered.   Patient with dehydration that was resolved with 2 L of fluid.  She does have some white blood cells in her urine but no other indication of infection.  She will get a urine culture.  Patient sent back to the nursing home for follow-up with PCP  {Document critical care time when appropriate:1} {Document review of labs and clinical decision tools ie heart score, Chads2Vasc2 etc:1}  {Document your independent review of radiology images, and any outside records:1} {Document your discussion with family members, caretakers, and with consultants:1} {Document social determinants of health affecting pt's care:1} {Document your decision making why or why not admission, treatments were needed:1} Final Clinical Impression(s) / ED Diagnoses Final diagnoses:  Dehydration    Rx / DC Orders ED Discharge Orders     None

## 2022-05-24 NOTE — Progress Notes (Signed)
Location:   SNF [place] Room Number: 51 Place of Service:  SNF (31) Provider: [facility] Audriana Aldama NP  Virgie Dad, MD  Patient Care Team: Virgie Dad, MD as PCP - General (Internal Medicine)  Extended Emergency Contact Information Primary Emergency Contact: [place] Mobile Phone: [phone] Relation: Grandson Interpreter needed? No Secondary Emergency Contact: [place] Mobile Phone: [phone] Relation: Granddaughter  Code Status: DNR Goals of care: Advanced Directive information    04/20/2022   12:30 PM  Advanced Directives  Does Patient Have a Medical Advance Directive? Yes  Type of Paramedic of [place];Living will;Out of facility DNR (pink MOST or yellow form)  Does patient want to make changes to medical advance directive? No - Patient declined  Copy of [place] in Chart? Yes - validated most recent copy scanned in chart (See row information)     Chief Complaint  Patient presents with   Acute Visit    Lethargy, increased confusion, poor oral intake.     HPI:  Pt is a 88 y.o. female seen today for an acute visit for increased confusion, lethargy, found sitting on floor back her wheelchair. Afebrile, denied abd pain, poor historian, not sure if dysuria, or chest pain present. Also the patient was noted O2 desaturation.   Dysphagia III diet             Dementia, intermittent agitation, improving.  03/03/22 CT head: Generalized cerebral atrophy, chronic small vessel ischemic disease.              Chronic Afib, heart rate is in control, takes ASA 29m qd, Hgb 13.7 03/17/22             HTN, takes Losartan,  Bun/creat 28/0.9 03/17/22             T2DM, on diet, Hgb a1c 7.4 03/17/22             OP,  Off Prolia-HPOA reuquest 05/06/22, takes  Ca, Vit D             Depression/Anxiety, taking Quetiapine, Mirtazapine, Depakote, prn Lorazepam, positive responses,  less anxiety, irritation, and restlessness .              Hyponatremia, Na 138 03/17/22  Past Medical History:  Diagnosis Date   Anxiety    Arthritis    [place]    hx of skin cancer removal on hand    Colitis    Diabetes mellitus without complication ([place]    Gallstones    Hyperlipidemia    Hypertension    IBS (irritable bowel syndrome)    Osteoporosis    Past Surgical History:  Procedure Laterality Date   APPENDECTOMY     BACK SURGERY     x2   cataract surgery      bilateral    [place]    for [place]    left eye peeling surgery      left femur bone surgery      1978   REVERSE SHOULDER ARTHROPLASTY Right 10/10/2014   Procedure: RIGHT REVERSE SHOULDER ARTHROPLASTY;  Surgeon: KJustice Britain MD;  [place]  Service: Orthopedics;  Laterality: Right;   ROTATOR CUFF REPAIR     TOTAL HIP ARTHROPLASTY Left 02/20/2014   Procedure: LEFT TOTAL HIP ARTHROPLASTY ANTERIOR APPROACH;  Surgeon: FGearlean Alf MD;  Location: [facility];  Service: Orthopedics;  Laterality: Left;   WRIST SURGERY  left     Allergies  Allergen Reactions   Robaxin [Methocarbamol] Rash and Other (See Comments)    Red rash on right side of face and forehead cleared after benadryl given, per [name], RN   Cefazolin Diarrhea   Dilaudid [Hydromorphone] Other (See Comments)    Stomach upset   Fosamax [Alendronate] Other (See Comments)    Upset stomach   Hyoscyamine Other (See Comments)    "Caused gas"   Morphine And Related Itching   Nsaids Diarrhea   Parathyroid Hormone (Recomb) Other (See Comments)    was not able to give herself the shot   Penicillins Itching    Tolerated Unasyn Has patient had a PCN reaction causing immediate rash, facial/tongue/throat swelling, SOB or lightheadedness with hypotension: Yes Has patient had a PCN reaction causing severe rash involving mucus membranes or skin necrosis: No Has patient had a PCN reaction that required hospitalization: Was in hospital Has patient had a PCN reaction  occurring within the last 10 years: No If all of the above answers are "NO", then may proceed with Cephalosporin use.    Tape Other (See Comments)    Bandages and leads cause bruising and weeping at sites   Flurbiprofen Diarrhea    Allergies as of 05/24/2022       Reactions   Robaxin [methocarbamol] Rash, Other (See Comments)   Red rash on right side of face and forehead cleared after benadryl given, per [name], RN   Cefazolin Diarrhea   Dilaudid [hydromorphone] Other (See Comments)   Stomach upset   Fosamax [alendronate] Other (See Comments)   Upset stomach   Hyoscyamine Other (See Comments)   "Caused gas"   Morphine And Related Itching   Nsaids Diarrhea   Parathyroid Hormone (recomb) Other (See Comments)   was not able to give herself the shot   Penicillins Itching   Tolerated Unasyn Has patient had a PCN reaction causing immediate rash, facial/tongue/throat swelling, SOB or lightheadedness with hypotension: Yes Has patient had a PCN reaction causing severe rash involving mucus membranes or skin necrosis: No Has patient had a PCN reaction that required hospitalization: Was in hospital Has patient had a PCN reaction occurring within the last 10 years: No If all of the above answers are "NO", then may proceed with Cephalosporin use.   Tape Other (See Comments)   Bandages and leads cause bruising and weeping at sites   Flurbiprofen Diarrhea        Medication List        Accurate as of May 24, 2022  3:26 PM. If you have any questions, ask your nurse or doctor.          acetaminophen 500 MG tablet Commonly known as: TYLENOL Take 2 tablets (1,000 mg total) by mouth 3 (three) times daily as needed.   aspirin EC 81 MG tablet Take 81 mg by mouth daily.   calcium carbonate 1500 (600 Ca) MG Tabs tablet Commonly known as: OSCAL Take 600 mg of elemental calcium by mouth daily.   Cholecalciferol 25 MCG (1000 UT) tablet Take 1,000 Units by mouth daily.   divalproex  125 MG capsule Commonly known as: DEPAKOTE SPRINKLE Take 125 mg by mouth 2 (two) times daily.   DSS 100 MG Caps Take 100 mg by mouth 2 (two) times daily.   gabapentin 100 MG capsule Commonly known as: NEURONTIN Take 200 mg by mouth 2 (two) times daily.   lactose free nutrition Liqd Take 237 mLs by mouth 2 (two) times  daily between meals.   loratadine 10 MG tablet Commonly known as: CLARITIN Take 10 mg by mouth daily as needed for allergies.   LORazepam 0.5 MG tablet Commonly known as: ATIVAN Take 0.5 mg by mouth every 6 (six) hours as needed for anxiety.   losartan 50 MG tablet Commonly known as: COZAAR Take 1 tablet (50 mg total) by mouth daily.   MELATONIN PO Take 3 mg by mouth at bedtime as needed (sleep).   mirtazapine 15 MG tablet Commonly known as: REMERON Take 15 mg by mouth at bedtime.   PreserVision AREDS 2 Caps Take 1 capsule by mouth daily.   QUEtiapine 25 MG tablet Commonly known as: SEROQUEL Take 12.5 mg by mouth 2 (two) times daily.   traMADol 50 MG tablet Commonly known as: ULTRAM Take 0.5 tablets (25 mg total) by mouth every 8 (eight) hours as needed.        Review of Systems  Constitutional:  Positive for appetite change and fatigue. Negative for fever.       Lethargic  HENT:  Positive for hearing loss and trouble swallowing. Negative for congestion.   Eyes:  Positive for visual disturbance.       Blind right eye.   Respiratory:  Negative for cough and shortness of breath.   Cardiovascular:  Positive for leg swelling.  Gastrointestinal:  Negative for abdominal pain and constipation.  Genitourinary:  Positive for frequency. Negative for dysuria, hematuria and urgency.  Musculoskeletal:  Positive for arthralgias, back pain and gait problem.       C/o lower back pain  Skin:  Negative for color change.  Neurological:  Negative for speech difficulty, weakness and headaches.       Memory difficulty.   Psychiatric/Behavioral:  Positive for  behavioral problems and confusion. Negative for sleep disturbance. The patient is not nervous/anxious.     Immunization History  Administered Date(s) Administered   Influenza Split 07/04/2009, 04/09/2010, 05/06/2011, 03/29/2012, 05/03/2013   Influenza, High Dose Seasonal PF 05/02/2013, 05/25/2017, 04/26/2018, 04/24/2019   Influenza, Quadrivalent, Recombinant, Inj, Pf 04/26/2020   Influenza,inj,Quad PF,6+ Mos 05/26/2021   Influenza,inj,quad, With Preservative 04/18/2017   Influenza-Unspecified 05/12/2015   PFIZER(Purple Top)SARS-COV-2 Vaccination 08/25/2019, 09/19/2019   Pfizer Covid-19 Vaccine Bivalent Booster 91yr & up 05/01/2020, 01/20/2021, 10/27/2021   Pneumococcal Conjugate-13 01/29/2014   Pneumococcal Polysaccharide-23 06/07/2007   Td 05/02/2003   Tdap 10/18/2011   Zoster, Live 01/17/2006   Pertinent  Health Maintenance Due  Topic Date Due   FOOT EXAM  Never done   OPHTHALMOLOGY EXAM  Never done   DEXA SCAN  Never done   INFLUENZA VACCINE  02/16/2022   HEMOGLOBIN A1C  09/16/2022      10/16/2021   12:00 AM 10/16/2021    9:00 AM 12/10/2021    4:03 PM 03/02/2022   11:50 PM 04/20/2022   12:30 PM  Fall Risk  Falls in the past year?     0  Patient Fall Risk Level High fall risk High fall risk High fall risk High fall risk Moderate fall risk  Patient at Risk for Falls Due to     History of fall(s)  Fall risk Follow up     Falls evaluation completed   Functional Status Survey:    Vitals:   05/24/22 1521  BP: (!) 140/83  Pulse: 95  Resp: 17  Temp: (!) 97.3 F (36.3 C)  SpO2: 96%  Weight: 142 lb 12.8 oz (64.8 kg)   Body mass index is 26.12 kg/m. Physical  Exam Nursing note reviewed.  Constitutional:      Comments: Lethargic  HENT:     Head: Normocephalic and atraumatic.     Nose: Nose normal.     Mouth/Throat:     Mouth: Mucous membranes are moist.  Eyes:     Extraocular Movements: Extraocular movements intact.     Conjunctiva/sclera: Conjunctivae normal.      Pupils: Pupils are equal, round, and reactive to light.     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 Crusted eyelashes.   Cardiovascular:     Rate and Rhythm: Normal rate and regular rhythm.     Heart sounds: No murmur heard. Pulmonary:     Breath sounds: No wheezing, rhonchi or rales.     Comments: O2 desaturation.  Abdominal:     General: Bowel sounds are normal.     Palpations: Abdomen is soft.     Tenderness: There is no abdominal tenderness. There is no right CVA tenderness, guarding or rebound.  Musculoskeletal:     Cervical back: Normal range of motion and neck supple.     Right lower leg: Edema present.     Left lower leg: Edema present.     Comments: No spinal spinous processes tenderness palpated. Trace edema BLE  Skin:    General: Skin is warm and dry.  Neurological:     General: No focal deficit present.     Mental Status: She is alert. Mental status is at baseline.     Motor: No weakness.     Coordination: Coordination normal.     Gait: Gait abnormal.     Comments: Oriented to person  Psychiatric:     Comments: Lethargic upon my examination.      Labs reviewed: Recent Labs    10/15/21 0640 10/16/21 0328 10/23/21 0000 12/10/21 1601 12/10/21 1823 12/29/21 0000 02/02/22 0000 03/05/22 0000 03/17/22 0000  NA 133* 137   < > 137 134*   < > 140 133* 138  K 3.1* 4.0   < > 5.3* 5.1   < > 3.6 5.0 4.9  CL 101 111   < > 100 101   < > 101 97* 98*  CO2 23 21*   < > 27 27   < > 30* 23* 32*  GLUCOSE 118* 105*  --  99 112*  --   --   --   --   BUN 11 15   < > 38* 35*   < > 40* 40* 28*  CREATININE 0.60 0.61   < > 0.95 0.91   < > 1.2* 1.1 0.9  CALCIUM 8.7* 8.4*   < > 9.4 9.0   < > 9.9 9.5 10.0  MG 1.7  --   --   --   --   --   --   --   --   PHOS 2.7  --   --   --   --   --   --   --   --    < > = values in this interval not displayed.   Recent Labs    10/11/21 0325 10/12/21 0322 10/23/21 0000 12/10/21 1601 12/29/21 0000 03/05/22 0000  AST 49* 39   <  > _0 ALT 25 24   < > _1 ALKPHOS 41 34*   < > 42 56 61  BILITOT 0.6 0.4  --  0.4  --   --   PROT 5.8*  5.1*  --  6.8  --   --   ALBUMIN 3.1* 2.7*   < > 2.9* 3.7 4.0   < > = values in this interval not displayed.   Recent Labs    10/13/21 0704 10/16/21 0328 10/23/21 0000 12/10/21 1601 12/29/21 0000 03/05/22 0000 03/17/22 0000  WBC 9.4 9.2   < > 11.1* 8.0 7.4 11.5  NEUTROABS  --   --    < > 7.3 3,584.00 4,544.00  --   HGB 11.6* 12.0   < > 10.4* 12.1 13.7 13.7  HCT 35.5* 36.7   < > 32.2* 37 41 42  MCV 96.7 94.8  --  98.2  --   --   --   PLT 282 365   < > 363 335 289 432*   < > = values in this interval not displayed.   No results found for: "TSH" Lab Results  Component Value Date   HGBA1C 7.4 03/17/2022   No results found for: "CHOL", "HDL", "LDLCALC", "LDLDIRECT", "TRIG", "CHOLHDL"  Significant Diagnostic Results in last 30 days:  No results found.  Assessment/Plan: Generalized weakness ncreased confusion, lethargy, found sitting on floor back her wheelchair. Afebrile, denied abd pain, poor historian, not sure if dysuria, or chest pain present. Also the patient was noted O2 desaturation. CBC/diff, CMP/eGFR, UA C/S, CXR Decrease Seroquel to 12.66m qd/bid, O2 2lpm via [place] to maintain Sat O2>90%  Dysphagia Dysphagia III diet.   Vascular dementia ([place] intermittent agitation, improving.  03/03/22 CT head: Generalized cerebral atrophy, chronic small vessel ischemic disease.  Chronic a-fib (HCC) heart rate is in control, takes ASA 8211mqd, Hgb 13.7 03/17/22  Essential hypertension Blood pressure is controlled,  takes Losartan,  Bun/creat 28/0.9 03/17/22  Diabetes mellitus type 2, diet-controlled ([place] diet, Hgb a1c 7.4 03/17/22  Osteoporosis  Off Prolia-HPOA reuquest 05/06/22, takes  Ca, Vit D  Anxiety due to dementia ([place], will decrease Quetiapine 12.11m40md/bid, continue Mirtazapine, Depakote, prn Lorazepam, positive responses,  less  anxiety, irritation, and restlessness .  Hyponatremia Na 138 03/17/22  Altered mental status Lethargy, increased confusion, HPOA desires ED for further evaluation and treatment.     Family/ staff Communication: plan of care reviewed with the patient and charge nurse.   Labs/tests ordered:  CBC/diff, CMP/eGFR, CXR, UA C/S  Time spend 35 minutes.

## 2022-05-24 NOTE — Assessment & Plan Note (Signed)
Off Prolia-HPOA reuquest 05/06/22, takes  Ca, Vit D

## 2022-05-24 NOTE — Assessment & Plan Note (Signed)
Dysphagia III diet.

## 2022-05-25 ENCOUNTER — Encounter: Payer: Self-pay | Admitting: Nurse Practitioner

## 2022-05-25 ENCOUNTER — Other Ambulatory Visit: Payer: Self-pay | Admitting: Nurse Practitioner

## 2022-05-25 ENCOUNTER — Non-Acute Institutional Stay (SKILLED_NURSING_FACILITY): Payer: Medicare Other | Admitting: Nurse Practitioner

## 2022-05-25 DIAGNOSIS — N183 Chronic kidney disease, stage 3 unspecified: Secondary | ICD-10-CM | POA: Insufficient documentation

## 2022-05-25 DIAGNOSIS — F0394 Unspecified dementia, unspecified severity, with anxiety: Secondary | ICD-10-CM

## 2022-05-25 DIAGNOSIS — F01C11 Vascular dementia, severe, with agitation: Secondary | ICD-10-CM | POA: Diagnosis not present

## 2022-05-25 DIAGNOSIS — R0602 Shortness of breath: Secondary | ICD-10-CM | POA: Diagnosis not present

## 2022-05-25 DIAGNOSIS — I10 Essential (primary) hypertension: Secondary | ICD-10-CM

## 2022-05-25 DIAGNOSIS — R918 Other nonspecific abnormal finding of lung field: Secondary | ICD-10-CM | POA: Diagnosis not present

## 2022-05-25 DIAGNOSIS — E871 Hypo-osmolality and hyponatremia: Secondary | ICD-10-CM | POA: Diagnosis not present

## 2022-05-25 DIAGNOSIS — R1310 Dysphagia, unspecified: Secondary | ICD-10-CM | POA: Diagnosis not present

## 2022-05-25 DIAGNOSIS — E119 Type 2 diabetes mellitus without complications: Secondary | ICD-10-CM | POA: Diagnosis not present

## 2022-05-25 DIAGNOSIS — N1831 Chronic kidney disease, stage 3a: Secondary | ICD-10-CM | POA: Diagnosis not present

## 2022-05-25 DIAGNOSIS — R0902 Hypoxemia: Secondary | ICD-10-CM

## 2022-05-25 DIAGNOSIS — I482 Chronic atrial fibrillation, unspecified: Secondary | ICD-10-CM | POA: Diagnosis not present

## 2022-05-25 DIAGNOSIS — R4182 Altered mental status, unspecified: Secondary | ICD-10-CM

## 2022-05-25 DIAGNOSIS — I509 Heart failure, unspecified: Secondary | ICD-10-CM | POA: Diagnosis not present

## 2022-05-25 DIAGNOSIS — M810 Age-related osteoporosis without current pathological fracture: Secondary | ICD-10-CM

## 2022-05-25 LAB — URINE CULTURE: Culture: NO GROWTH

## 2022-05-25 NOTE — Assessment & Plan Note (Signed)
taking Quetiapine, Mirtazapine, Depakote, prn Lorazepam, intermittent irritation, and restlessness .

## 2022-05-25 NOTE — Assessment & Plan Note (Signed)
Na 138 05/24/22

## 2022-05-25 NOTE — Assessment & Plan Note (Signed)
intermittent agitation, 03/03/22 CT head: Generalized cerebral atrophy, chronic small vessel ischemic disease. Hospice referral is HPOA consents.

## 2022-05-25 NOTE — Assessment & Plan Note (Signed)
ED evaluation 05/24/22, for creased confusion, lethargy, O2 desaturation. Bun/creat 36/1.09, wbc 11.8, neutrophils 65, unremarkable UA. Seroquel was decreased to 12.'5mg'$  qd/bid. O2 as needed. Will obtain CXR ap/lateral. Continue GDR of Seroquel/Depakote/Lorazepam if lethargy persists.

## 2022-05-25 NOTE — Assessment & Plan Note (Signed)
Off Prolia-HPOA reuquest 05/06/22, takes  Ca, Vit D

## 2022-05-25 NOTE — Progress Notes (Signed)
Location:  [place] Room Number: NO/35/A Place of Service:  SNF (31) Provider:  Cambreigh Dearing X, NP  Patient Care Team: Kaveri Perras X, NP as PCP - General (Internal Medicine)  Extended Emergency Contact Information Primary Emergency Contact: [place] Mobile Phone: [phone] Relation: Grandson Interpreter needed? No Secondary Emergency Contact: [place] Mobile Phone: [phone] Relation: Granddaughter  Code Status:  DNR Goals of care: Advanced Directive information    05/24/2022    4:29 PM  Advanced Directives  Does Patient Have a Medical Advance Directive? Yes  Type of Advance Directive Out of facility DNR (pink MOST or yellow form)  Does patient want to make changes to medical advance directive? Yes (ED - Information included in AVS)     Chief Complaint  Patient presents with   Acute Visit    Patient is being seen for an ED F/U    HPI:  Pt is a 88 y.o. female seen today for an acute visit for f/u ED evaluation 05/24/22.    for creased confusion, lethargy, O2 desaturation. Bun/creat 36/1.09, wbc 11.8, neutrophils 65, unremarkable UA. Seroquel was decreased to 12.'5mg'$  qd/bid. O2 as needed.              CKD, encourage oral fluid intake Dysphagia III diet             Dementia, intermittent agitation, 03/03/22 CT head: Generalized cerebral atrophy, chronic small vessel ischemic disease.              Chronic Afib, heart rate is in control, takes ASA '81mg'$  qd, Hgb 13.4 05/24/22             HTN, takes Losartan             T2DM, on diet, Hgb a1c 7.4 03/17/22             OP,  Off Prolia-HPOA reuquest 05/06/22, takes  Ca, Vit D             Depression/Anxiety, taking Quetiapine, Mirtazapine, Depakote, prn Lorazepam, intermittent irritation, and restlessness .             Hyponatremia, Na 138 05/24/22   Past Medical History:  Diagnosis Date   Anxiety    Arthritis    Cancer ([place])    hx of skin cancer removal on hand    Colitis    Diabetes  mellitus without complication ([place])    Gallstones    Hyperlipidemia    Hypertension    IBS (irritable bowel syndrome)    Osteoporosis    Past Surgical History:  Procedure Laterality Date   APPENDECTOMY     BACK SURGERY     x2   cataract surgery      bilateral    [place]     for glaucoma    left eye peeling surgery      left femur bone surgery      1978   REVERSE SHOULDER ARTHROPLASTY Right 10/10/2014   Procedure: RIGHT REVERSE SHOULDER ARTHROPLASTY;  Surgeon: Justice Britain, MD;  Location: [facility];  Service: Orthopedics;  Laterality: Right;   ROTATOR CUFF REPAIR     TOTAL HIP ARTHROPLASTY Left 02/20/2014   Procedure: LEFT TOTAL HIP ARTHROPLASTY ANTERIOR APPROACH;  Surgeon: Gearlean Alf, MD;  Location: [facility];  Service: Orthopedics;  Laterality: Left;   WRIST SURGERY     left     Allergies  Allergen Reactions   Robaxin [Methocarbamol] Rash and Other (  See Comments)    Red rash on right side of face and forehead cleared after benadryl given, per [name], RN   Cefazolin Diarrhea   Dilaudid [Hydromorphone] Other (See Comments)    Stomach upset   Fosamax [Alendronate] Other (See Comments)    Upset stomach   Hyoscyamine Other (See Comments)    "Caused gas"   Morphine And Related Itching   Nsaids Diarrhea   Parathyroid Hormone (Recomb) Other (See Comments)    was not able to give herself the shot   Penicillins Itching    Tolerated Unasyn Has patient had a PCN reaction causing immediate rash, facial/tongue/throat swelling, SOB or lightheadedness with hypotension: Yes Has patient had a PCN reaction causing severe rash involving mucus membranes or skin necrosis: No Has patient had a PCN reaction that required hospitalization: Was in hospital Has patient had a PCN reaction occurring within the last 10 years: No If all of the above answers are "NO", then may proceed with Cephalosporin use.    Tape Other (See Comments)    Bandages and leads cause bruising  and weeping at sites   Flurbiprofen Diarrhea    Outpatient Encounter Medications as of 05/25/2022  Medication Sig   acetaminophen (TYLENOL) 500 MG tablet Take 2 tablets (1,000 mg total) by mouth 3 (three) times daily as needed.   aspirin EC 81 MG tablet Take 81 mg by mouth daily.   calcium carbonate (OSCAL) 1500 (600 Ca) MG TABS tablet Take 600 mg of elemental calcium by mouth daily.   Cholecalciferol 25 MCG (1000 UT) tablet Take 1,000 Units by mouth daily.   divalproex (DEPAKOTE SPRINKLE) 125 MG capsule Take 125 mg by mouth 2 (two) times daily.   docusate sodium 100 MG CAPS Take 100 mg by mouth 2 (two) times daily.   gabapentin (NEURONTIN) 100 MG capsule Take 200 mg by mouth 2 (two) times daily.   lactose free nutrition (BOOST) LIQD Take 237 mLs by mouth 2 (two) times daily between meals.   loratadine (CLARITIN) 10 MG tablet Take 10 mg by mouth daily as needed for allergies.   LORazepam (ATIVAN) 0.5 MG tablet Take 0.5 mg by mouth every 6 (six) hours as needed for anxiety.   losartan (COZAAR) 50 MG tablet Take 1 tablet (50 mg total) by mouth daily.   MELATONIN PO Take 3 mg by mouth at bedtime as needed (sleep).   mirtazapine (REMERON) 15 MG tablet Take 15 mg by mouth at bedtime.   Multiple Vitamins-Minerals (PRESERVISION AREDS 2) CAPS Take 1 capsule by mouth daily.   QUEtiapine (SEROQUEL) 25 MG tablet Take 12.5 mg by mouth 2 (two) times daily.   traMADol (ULTRAM) 50 MG tablet Take 0.5 tablets (25 mg total) by mouth every 8 (eight) hours as needed.   No facility-administered encounter medications on file as of 05/25/2022.    Review of Systems  Constitutional:  Positive for appetite change and fatigue. Negative for fever.       Lethargic  HENT:  Positive for hearing loss and trouble swallowing. Negative for congestion.   Eyes:  Positive for visual disturbance.       Blind right eye.   Respiratory:  Positive for shortness of breath. Negative for cough and wheezing.   Cardiovascular:   Negative for leg swelling.  Gastrointestinal:  Negative for abdominal pain and constipation.  Genitourinary:  Positive for frequency. Negative for dysuria, hematuria and urgency.  Musculoskeletal:  Positive for arthralgias, back pain and gait problem.  C/o lower back pain  Skin:  Negative for color change.  Neurological:  Negative for speech difficulty, weakness and headaches.       Memory difficulty.   Psychiatric/Behavioral:  Positive for behavioral problems and confusion. Negative for sleep disturbance. The patient is not nervous/anxious.     Immunization History  Administered Date(s) Administered   Influenza Split 07/04/2009, 04/09/2010, 05/06/2011, 03/29/2012, 05/03/2013   Influenza, High Dose Seasonal PF 05/02/2013, 05/25/2017, 04/26/2018, 04/24/2019   Influenza, Quadrivalent, Recombinant, Inj, Pf 04/26/2020   Influenza,inj,Quad PF,6+ Mos 05/26/2021   Influenza,inj,quad, With Preservative 04/18/2017   Influenza-Unspecified 05/12/2015   PFIZER(Purple Top)SARS-COV-2 Vaccination 08/25/2019, 09/19/2019   Pfizer Covid-19 Vaccine Bivalent Booster 40yr & up 05/01/2020, 01/20/2021, 10/27/2021   Pneumococcal Conjugate-13 01/29/2014   Pneumococcal Polysaccharide-23 06/07/2007   Td 05/02/2003   Tdap 10/18/2011   Zoster, Live 01/17/2006   Pertinent  Health Maintenance Due  Topic Date Due   FOOT EXAM  Never done   OPHTHALMOLOGY EXAM  Never done   DEXA SCAN  Never done   INFLUENZA VACCINE  02/16/2022   HEMOGLOBIN A1C  09/16/2022      10/16/2021    9:00 AM 12/10/2021    4:03 PM 03/02/2022   11:50 PM 04/20/2022   12:30 PM 05/24/2022    4:29 PM  Fall Risk  Falls in the past year?    0   Patient Fall Risk Level High fall risk High fall risk High fall risk Moderate fall risk High fall risk  Patient at Risk for Falls Due to    History of fall(s)   Fall risk Follow up    Falls evaluation completed    Functional Status Survey:    Vitals:   05/25/22 1354  BP: (!) 145/59  Pulse:  74  Resp: 18  Temp: (!) 97.4 F (36.3 C)  SpO2: 94%  Weight: 142 lb (64.4 kg)  Height: '5\' 2"'$  (1.575 m)   Body mass index is 25.97 kg/m. Physical Exam Nursing note reviewed.  Constitutional:      Comments: Lethargic  HENT:     Head: Normocephalic and atraumatic.     Nose: Nose normal.     Mouth/Throat:     Mouth: Mucous membranes are moist.  Eyes:     Extraocular Movements: Extraocular movements intact.     Conjunctiva/sclera: Conjunctivae normal.     Pupils: Pupils are equal, round, and reactive to light.     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 Crusted eyelashes.   Cardiovascular:     Rate and Rhythm: Normal rate and regular rhythm.     Heart sounds: No murmur heard. Pulmonary:     Breath sounds: No wheezing or rales.     Comments: O2 desaturation.  Abdominal:     General: Bowel sounds are normal.     Palpations: Abdomen is soft.     Tenderness: There is no abdominal tenderness.  Musculoskeletal:     Cervical back: Normal range of motion and neck supple.     Right lower leg: No edema.     Left lower leg: No edema.  Skin:    General: Skin is warm and dry.  Neurological:     General: No focal deficit present.     Mental Status: She is alert. Mental status is at baseline.     Motor: No weakness.     Coordination: Coordination normal.     Gait: Gait abnormal.     Comments: Oriented to person  Psychiatric:  Comments: awake     Labs reviewed: Recent Labs    10/15/21 0640 10/16/21 0328 12/10/21 1601 12/10/21 1823 12/29/21 0000 03/05/22 0000 03/17/22 0000 05/24/22 1738  NA 133*   < > 137 134*   < > 133* 138 138  K 3.1*   < > 5.3* 5.1   < > 5.0 4.9 4.1  CL 101   < > 100 101   < > 97* 98* 105  CO2 23   < > 27 27   < > 23* 32* 23  GLUCOSE 118*   < > 99 112*  --   --   --  150*  BUN 11   < > 38* 35*   < > 40* 28* 36*  CREATININE 0.60   < > 0.95 0.91   < > 1.1 0.9 1.09*  CALCIUM 8.7*   < > 9.4 9.0   < > 9.5 10.0 9.1  MG 1.7  --   --   --    --   --   --   --   PHOS 2.7  --   --   --   --   --   --   --    < > = values in this interval not displayed.   Recent Labs    10/12/21 0322 10/23/21 0000 12/10/21 1601 12/29/21 0000 03/05/22 0000 05/24/22 1738  AST 39   < > '15 18 29 '$ 34  ALT 24   < > '11 12 25 21  '$ ALKPHOS 34*   < > 42 56 61 88  BILITOT 0.4  --  0.4  --   --  0.7  PROT 5.1*  --  6.8  --   --  7.6  ALBUMIN 2.7*   < > 2.9* 3.7 4.0 3.6   < > = values in this interval not displayed.   Recent Labs    10/16/21 0328 10/23/21 0000 12/10/21 1601 12/29/21 0000 03/05/22 0000 03/17/22 0000 05/24/22 1738  WBC 9.2   < > 11.1* 8.0 7.4 11.5 11.8*  NEUTROABS  --    < > 7.3 3,584.00 4,544.00  --  7.7  HGB 12.0   < > 10.4* 12.1 13.7 13.7 13.4  HCT 36.7   < > 32.2* 37 41 42 42.0  MCV 94.8  --  98.2  --   --   --  96.6  PLT 365   < > 363 335 289 432* 343   < > = values in this interval not displayed.   No results found for: "TSH" Lab Results  Component Value Date   HGBA1C 7.4 03/17/2022   No results found for: "CHOL", "HDL", "LDLCALC", "LDLDIRECT", "TRIG", "CHOLHDL"  Significant Diagnostic Results in last 30 days:  No results found.  Assessment/Plan Hypoxia ED evaluation 05/24/22 for creased confusion, lethargy, O2 desaturation. Bun/creat 36/1.09, wbc 11.8, neutrophils 65, unremarkable UA. Seroquel was decreased to 12.'5mg'$  qd/bid 05/24/22. O2 as needed. Will obtain CXR ap/lateral   Altered mental status ED evaluation 05/24/22, for creased confusion, lethargy, O2 desaturation. Bun/creat 36/1.09, wbc 11.8, neutrophils 65, unremarkable UA. Seroquel was decreased to 12.'5mg'$  qd/bid. O2 as needed. Will obtain CXR ap/lateral. Continue GDR of Seroquel/Depakote/Lorazepam if lethargy persists.   Dysphagia Dysphagia III diet.     Vascular dementia ([place]) intermittent agitation, 03/03/22 CT head: Generalized cerebral atrophy, chronic small vessel ischemic disease. Hospice referral is HPOA consents.   Chronic a-fib ([facility]) heart  rate is in control, takes ASA '81mg'$  qd, Hgb  13.4 05/24/22  Essential hypertension Blood pressure is controlled, takes Losartan  Diabetes mellitus type 2, diet-controlled ([place]) on diet, Hgb a1c 7.4 03/17/22  Osteoporosis Off Prolia-HPOA reuquest 05/06/22, takes  Ca, Vit D  Anxiety due to dementia ([place])  taking Quetiapine, Mirtazapine, Depakote, prn Lorazepam, intermittent irritation, and restlessness .  Hyponatremia Na 138 05/24/22     Family/ staff Communication: plan of care reviewed with the patient and charge nurse.   Labs/tests ordered: CXR ap/lateral.   Time spend 35 minutes.

## 2022-05-25 NOTE — Assessment & Plan Note (Signed)
Blood pressure is controlled, takes Losartan

## 2022-05-25 NOTE — Assessment & Plan Note (Signed)
heart rate is in control, takes ASA '81mg'$  qd, Hgb 13.4 05/24/22

## 2022-05-25 NOTE — Assessment & Plan Note (Signed)
Dysphagia III diet.

## 2022-05-25 NOTE — Assessment & Plan Note (Signed)
ED evaluation 05/24/22 for creased confusion, lethargy, O2 desaturation. Bun/creat 36/1.09, wbc 11.8, neutrophils 65, unremarkable UA. Seroquel was decreased to 12.'5mg'$  qd/bid 05/24/22. O2 as needed. Will obtain CXR ap/lateral

## 2022-05-25 NOTE — Assessment & Plan Note (Signed)
on diet, Hgb a1c 7.4 03/17/22

## 2022-05-26 ENCOUNTER — Non-Acute Institutional Stay (SKILLED_NURSING_FACILITY): Payer: Medicare Other | Admitting: Adult Health

## 2022-05-26 DIAGNOSIS — F01C11 Vascular dementia, severe, with agitation: Secondary | ICD-10-CM | POA: Diagnosis not present

## 2022-05-26 DIAGNOSIS — F418 Other specified anxiety disorders: Secondary | ICD-10-CM

## 2022-05-27 ENCOUNTER — Encounter: Payer: Self-pay | Admitting: Adult Health

## 2022-05-27 DIAGNOSIS — K529 Noninfective gastroenteritis and colitis, unspecified: Secondary | ICD-10-CM | POA: Diagnosis not present

## 2022-05-27 DIAGNOSIS — D631 Anemia in chronic kidney disease: Secondary | ICD-10-CM | POA: Diagnosis not present

## 2022-05-27 DIAGNOSIS — F0150 Vascular dementia without behavioral disturbance: Secondary | ICD-10-CM | POA: Diagnosis not present

## 2022-05-27 DIAGNOSIS — N183 Chronic kidney disease, stage 3 unspecified: Secondary | ICD-10-CM | POA: Diagnosis not present

## 2022-05-27 DIAGNOSIS — K589 Irritable bowel syndrome without diarrhea: Secondary | ICD-10-CM | POA: Diagnosis not present

## 2022-05-27 DIAGNOSIS — I69818 Other symptoms and signs involving cognitive functions following other cerebrovascular disease: Secondary | ICD-10-CM | POA: Diagnosis not present

## 2022-05-27 DIAGNOSIS — I4891 Unspecified atrial fibrillation: Secondary | ICD-10-CM | POA: Diagnosis not present

## 2022-05-27 DIAGNOSIS — K8020 Calculus of gallbladder without cholecystitis without obstruction: Secondary | ICD-10-CM | POA: Diagnosis not present

## 2022-05-27 DIAGNOSIS — E785 Hyperlipidemia, unspecified: Secondary | ICD-10-CM | POA: Diagnosis not present

## 2022-05-27 DIAGNOSIS — E1122 Type 2 diabetes mellitus with diabetic chronic kidney disease: Secondary | ICD-10-CM | POA: Diagnosis not present

## 2022-05-27 DIAGNOSIS — I129 Hypertensive chronic kidney disease with stage 1 through stage 4 chronic kidney disease, or unspecified chronic kidney disease: Secondary | ICD-10-CM | POA: Diagnosis not present

## 2022-05-27 DIAGNOSIS — M810 Age-related osteoporosis without current pathological fracture: Secondary | ICD-10-CM | POA: Diagnosis not present

## 2022-05-28 DIAGNOSIS — I69818 Other symptoms and signs involving cognitive functions following other cerebrovascular disease: Secondary | ICD-10-CM | POA: Diagnosis not present

## 2022-05-28 DIAGNOSIS — E1122 Type 2 diabetes mellitus with diabetic chronic kidney disease: Secondary | ICD-10-CM | POA: Diagnosis not present

## 2022-05-28 DIAGNOSIS — K529 Noninfective gastroenteritis and colitis, unspecified: Secondary | ICD-10-CM | POA: Diagnosis not present

## 2022-05-28 DIAGNOSIS — N183 Chronic kidney disease, stage 3 unspecified: Secondary | ICD-10-CM | POA: Diagnosis not present

## 2022-05-28 DIAGNOSIS — F0150 Vascular dementia without behavioral disturbance: Secondary | ICD-10-CM | POA: Diagnosis not present

## 2022-05-28 DIAGNOSIS — I129 Hypertensive chronic kidney disease with stage 1 through stage 4 chronic kidney disease, or unspecified chronic kidney disease: Secondary | ICD-10-CM | POA: Diagnosis not present

## 2022-05-28 NOTE — Progress Notes (Signed)
Location:  [facility] Room Number: 35-A Place of Service:  SNF (31) Provider:  Durenda Age, DNP, FNP-BC  Patient Care Team: Mast, Man X, NP as PCP - General (Internal Medicine)  Extended Emergency Contact Information Primary Emergency Contact: [place] Mobile Phone: [phone] Relation: Grandson Interpreter needed? No Secondary Emergency Contact: [place] Mobile Phone: [phone] Relation: Granddaughter  Code Status:  DNR  Goals of care: Advanced Directive information    05/27/2022    4:58 PM  Advanced Directives  Does Patient Have a Medical Advance Directive? Yes  Type of Paramedic of [place];Living will;Out of facility DNR (pink MOST or yellow form)  Does patient want to make changes to medical advance directive? No - Patient declined  Copy of [place] in Chart? Yes - validated most recent copy scanned in chart (See row information)  Pre-existing out of facility DNR order (yellow form or pink MOST form) Yellow form placed in chart (order not valid for inpatient use)     Chief Complaint  Patient presents with   Acute Visit    Disrobing and refusing medications    HPI:  Pt is a 88 y.o. female seen today for an acute visit regarding agitation. She is a long-term care resident of [facility]. She was reported to be disrobing, refusing medications and not eating. She was sent to ED on 05/24/22 due to not eating nor drinking. She was given IV fluid Normal Saline and sent back to SNF. She is now comfort care. She has not been able to take Seroquel, Ativan PRN and Depakote due to refusals. She has a PMH of  dementia, osteoporosis, hypertension, arthritis, hyperlipidemia, diabetes mellitus, skin cancer, HOH and anxiety                                                                                Past Medical History:  Diagnosis Date   Anxiety    Arthritis    Cancer  ([place])    hx of skin cancer removal on hand    Colitis    Diabetes mellitus without complication ([place])    Gallstones    Hyperlipidemia    Hypertension    IBS (irritable bowel syndrome)    Osteoporosis    Past Surgical History:  Procedure Laterality Date   APPENDECTOMY     BACK SURGERY     x2   cataract surgery      bilateral    [place]     for glaucoma    left eye peeling surgery      left femur bone surgery      1978   REVERSE SHOULDER ARTHROPLASTY Right 10/10/2014   Procedure: RIGHT REVERSE SHOULDER ARTHROPLASTY;  Surgeon: Justice Britain, MD;  Location: [facility];  Service: Orthopedics;  Laterality: Right;   ROTATOR CUFF REPAIR     TOTAL HIP ARTHROPLASTY Left 02/20/2014   Procedure: LEFT TOTAL HIP ARTHROPLASTY ANTERIOR APPROACH;  Surgeon: Gearlean Alf, MD;  Location: [facility];  Service: Orthopedics;  Laterality: Left;   WRIST SURGERY     left     Allergies  Allergen Reactions  Robaxin [Methocarbamol] Rash and Other (See Comments)    Red rash on right side of face and forehead cleared after benadryl given, per [name], RN   Cefazolin Diarrhea   Dilaudid [Hydromorphone] Other (See Comments)    Stomach upset   Fosamax [Alendronate] Other (See Comments)    Upset stomach   Hyoscyamine Other (See Comments)    "Caused gas"   Morphine And Related Itching   Nsaids Diarrhea   Parathyroid Hormone (Recomb) Other (See Comments)    was not able to give herself the shot   Penicillins Itching    Tolerated Unasyn Has patient had a PCN reaction causing immediate rash, facial/tongue/throat swelling, SOB or lightheadedness with hypotension: Yes Has patient had a PCN reaction causing severe rash involving mucus membranes or skin necrosis: No Has patient had a PCN reaction that required hospitalization: Was in hospital Has patient had a PCN reaction occurring within the last 10 years: No If all of the above answers are "NO", then may proceed with Cephalosporin  use.    Tape Other (See Comments)    Bandages and leads cause bruising and weeping at sites   Flurbiprofen Diarrhea    Outpatient Encounter Medications as of 05/26/2022  Medication Sig   acetaminophen (TYLENOL) 500 MG tablet Take 500 mg by mouth every 8 (eight) hours as needed for mild pain. Give 2 tablets   aspirin EC 81 MG tablet Take 81 mg by mouth daily.   calcium carbonate (OSCAL) 1500 (600 Ca) MG TABS tablet Take 600 mg of elemental calcium by mouth daily.   Cholecalciferol 25 MCG (1000 UT) tablet Take 1,000 Units by mouth daily.   divalproex (DEPAKOTE SPRINKLE) 125 MG capsule Take 125 mg by mouth 2 (two) times daily.   docusate sodium 100 MG CAPS Take 100 mg by mouth 2 (two) times daily.   erythromycin ophthalmic ointment Place 1 Application into both eyes at bedtime. Instill 0.5 ribbon related to Unspecified Blepharitis Right Eye, .Unspecified Eyelid (H01.003)1/2 ribbon to right and left subconjunctival sac qhs   gabapentin (NEURONTIN) 100 MG capsule Take 200 mg by mouth 2 (two) times daily.   lactose free nutrition (BOOST) LIQD Take 237 mLs by mouth 2 (two) times daily between meals.   loratadine (CLARITIN) 10 MG tablet Take 10 mg by mouth daily as needed for allergies.   LORazepam (ATIVAN) 0.5 MG tablet Take 0.5 mg by mouth every 6 (six) hours as needed for anxiety. For 14 days for Anxiety/Agitation   losartan (COZAAR) 50 MG tablet Take 50 mg by mouth daily. Give 0.5 tablet(1/2 ('25mg'$ )   MELATONIN PO Take 3 mg by mouth at bedtime as needed (sleep).   mirtazapine (REMERON) 15 MG tablet Take 15 mg by mouth at bedtime.   morphine 20 MG/5ML solution Take 0.25 mLs by mouth every 2 (two) hours as needed for pain.   Multiple Vitamins-Minerals (PRESERVISION AREDS 2) CAPS Take 1 capsule by mouth daily.   QUEtiapine (SEROQUEL) 25 MG tablet Take 12.5 mg by mouth daily. In the morning   traMADol (ULTRAM) 50 MG tablet Take 0.5 tablets (25 mg total) by mouth every 8 (eight) hours as needed.    [DISCONTINUED] acetaminophen (TYLENOL) 500 MG tablet Take 2 tablets (1,000 mg total) by mouth 3 (three) times daily as needed.   [DISCONTINUED] losartan (COZAAR) 50 MG tablet Take 1 tablet (50 mg total) by mouth daily.   No facility-administered encounter medications on file as of 05/26/2022.    Review of Systems   Unable to  obtain due to dementia.    Immunization History  Administered Date(s) Administered   Influenza Split 07/04/2009, 04/09/2010, 05/06/2011, 03/29/2012, 05/03/2013   Influenza, High Dose Seasonal PF 05/02/2013, 05/25/2017, 04/26/2018, 04/24/2019   Influenza, Quadrivalent, Recombinant, Inj, Pf 04/26/2020   Influenza,inj,Quad PF,6+ Mos 05/26/2021   Influenza,inj,quad, With Preservative 04/18/2017   Influenza-Unspecified 05/12/2015   PFIZER(Purple Top)SARS-COV-2 Vaccination 08/25/2019, 09/19/2019   Pfizer Covid-19 Vaccine Bivalent Booster 88yr & up 05/01/2020, 01/20/2021, 10/27/2021   Pneumococcal Conjugate-13 01/29/2014   Pneumococcal Polysaccharide-23 06/07/2007   Td 05/02/2003   Tdap 10/18/2011   Zoster, Live 01/17/2006   Pertinent  Health Maintenance Due  Topic Date Due   FOOT EXAM  Never done   OPHTHALMOLOGY EXAM  Never done   DEXA SCAN  Never done   INFLUENZA VACCINE  02/16/2022   HEMOGLOBIN A1C  09/16/2022      10/16/2021    9:00 AM 12/10/2021    4:03 PM 03/02/2022   11:50 PM 04/20/2022   12:30 PM 05/24/2022    4:29 PM  Fall Risk  Falls in the past year?    0   Patient Fall Risk Level High fall risk High fall risk High fall risk Moderate fall risk High fall risk  Patient at Risk for Falls Due to    History of fall(s)   Fall risk Follow up    Falls evaluation completed      Vitals:   05/27/22 0924  BP: (!) 161/60  Pulse: 80  Resp: 19  Temp: 98.2 F (36.8 C)  SpO2: 91%  Weight: 142 lb 12.8 oz (64.8 kg)  Height: '5\' 2"'$  (1.575 m)   Body mass index is 26.12 kg/m.  Physical Exam Constitutional:      General: She is not in acute  distress. HENT:     Head: Normocephalic and atraumatic.     Nose: Nose normal.     Mouth/Throat:     Mouth: Mucous membranes are moist.  Eyes:     Conjunctiva/sclera: Conjunctivae normal.  Cardiovascular:     Rate and Rhythm: Normal rate and regular rhythm.  Pulmonary:     Effort: Pulmonary effort is normal.     Breath sounds: Normal breath sounds.  Abdominal:     General: Bowel sounds are normal.     Palpations: Abdomen is soft.  Musculoskeletal:        General: Normal range of motion.     Cervical back: Normal range of motion.  Skin:    General: Skin is warm and dry.  Neurological:     Mental Status: Mental status is at baseline. She is disoriented.  Psychiatric:     Comments: agitated        Labs reviewed: Recent Labs    10/15/21 0640 10/16/21 0328 12/10/21 1601 12/10/21 1823 12/29/21 0000 03/17/22 0000 04/08/22 0000 05/24/22 1738  NA 133*   < > 137 134*   < > 138 132* 138  K 3.1*   < > 5.3* 5.1   < > 4.9 4.9 4.1  CL 101   < > 100 101   < > 98* 99 105  CO2 23   < > 27 27   < > 32* 25* 23  GLUCOSE 118*   < > 99 112*  --   --   --  150*  BUN 11   < > 38* 35*   < > 28* 31* 36*  CREATININE 0.60   < > 0.95 0.91   < > 0.9 0.9  1.09*  CALCIUM 8.7*   < > 9.4 9.0   < > 10.0 9.3 9.1  MG 1.7  --   --   --   --   --   --   --   PHOS 2.7  --   --   --   --   --   --   --    < > = values in this interval not displayed.   Recent Labs    10/12/21 0322 10/23/21 0000 12/10/21 1601 12/29/21 0000 03/05/22 0000 04/08/22 0000 05/24/22 1738  AST 39   < > 15   < > 29 19 34  ALT 24   < > 11   < > '25 13 21  '$ ALKPHOS 34*   < > 42   < > 61 55 88  BILITOT 0.4  --  0.4  --   --   --  0.7  PROT 5.1*  --  6.8  --   --   --  7.6  ALBUMIN 2.7*   < > 2.9*   < > 4.0 3.7 3.6   < > = values in this interval not displayed.   Recent Labs    10/16/21 0328 10/23/21 0000 12/10/21 1601 12/29/21 0000 03/05/22 0000 03/17/22 0000 04/08/22 0000 05/24/22 1738  WBC 9.2   < > 11.1*   <  > 7.4 11.5 8.1 11.8*  NEUTROABS  --    < > 7.3   < > 4,544.00  --  4,722.00 7.7  HGB 12.0   < > 10.4*   < > 13.7 13.7 12.2 13.4  HCT 36.7   < > 32.2*   < > 41 42 37 42.0  MCV 94.8  --  98.2  --   --   --   --  96.6  PLT 365   < > 363   < > 289 432* 384 343   < > = values in this interval not displayed.   No results found for: "TSH" Lab Results  Component Value Date   HGBA1C 7.4 03/17/2022   No results found for: "CHOL", "HDL", "LDLCALC", "LDLDIRECT", "TRIG", "CHOLHDL"  Significant Diagnostic Results in last 30 days:  No results found.  Assessment/Plan  1. Severe vascular dementia with agitation ([place]) -  agitated, disrobing -  will give Haldol 2 mg IM X 1 -  now comfort care  2. Depression with anxiety -  continue Seroquel, Depakote and PRN Ativan    Family/ staff Communication: Discussed plan of care with resident and charge nurse  Labs/tests ordered:   None    Durenda Age, DNP, MSN, FNP-BC [facility] [phone] (Monday-Friday 8:00 a.m. - 5:00 p.m.) [phone] (after hours)

## 2022-05-29 DIAGNOSIS — F0150 Vascular dementia without behavioral disturbance: Secondary | ICD-10-CM | POA: Diagnosis not present

## 2022-05-29 DIAGNOSIS — E1122 Type 2 diabetes mellitus with diabetic chronic kidney disease: Secondary | ICD-10-CM | POA: Diagnosis not present

## 2022-05-29 DIAGNOSIS — I129 Hypertensive chronic kidney disease with stage 1 through stage 4 chronic kidney disease, or unspecified chronic kidney disease: Secondary | ICD-10-CM | POA: Diagnosis not present

## 2022-05-29 DIAGNOSIS — I69818 Other symptoms and signs involving cognitive functions following other cerebrovascular disease: Secondary | ICD-10-CM | POA: Diagnosis not present

## 2022-05-29 DIAGNOSIS — K529 Noninfective gastroenteritis and colitis, unspecified: Secondary | ICD-10-CM | POA: Diagnosis not present

## 2022-05-29 DIAGNOSIS — N183 Chronic kidney disease, stage 3 unspecified: Secondary | ICD-10-CM | POA: Diagnosis not present

## 2022-06-07 ENCOUNTER — Encounter (INDEPENDENT_AMBULATORY_CARE_PROVIDER_SITE_OTHER): Payer: Medicare Other | Admitting: Ophthalmology

## 2022-06-18 DEATH — deceased
